# Patient Record
Sex: Male | Born: 1950 | Race: Black or African American | Hispanic: No | Marital: Married | Smoking: Former smoker
Health system: Southern US, Community
[De-identification: ages and names within clinical notes are randomized; demographics above are authoritative.]

## PROBLEM LIST (undated history)

## (undated) DIAGNOSIS — F4310 Post-traumatic stress disorder, unspecified: Secondary | ICD-10-CM

## (undated) DIAGNOSIS — M48061 Spinal stenosis, lumbar region without neurogenic claudication: Secondary | ICD-10-CM

## (undated) DIAGNOSIS — N400 Enlarged prostate without lower urinary tract symptoms: Secondary | ICD-10-CM

## (undated) DIAGNOSIS — I10 Essential (primary) hypertension: Secondary | ICD-10-CM

## (undated) DIAGNOSIS — F319 Bipolar disorder, unspecified: Secondary | ICD-10-CM

## (undated) DIAGNOSIS — C3490 Malignant neoplasm of unspecified part of unspecified bronchus or lung: Secondary | ICD-10-CM

## (undated) DIAGNOSIS — Z8719 Personal history of other diseases of the digestive system: Secondary | ICD-10-CM

## (undated) DIAGNOSIS — F528 Other sexual dysfunction not due to a substance or known physiological condition: Secondary | ICD-10-CM

## (undated) DIAGNOSIS — J309 Allergic rhinitis, unspecified: Secondary | ICD-10-CM

## (undated) DIAGNOSIS — E785 Hyperlipidemia, unspecified: Secondary | ICD-10-CM

## (undated) DIAGNOSIS — T8859XA Other complications of anesthesia, initial encounter: Secondary | ICD-10-CM

## (undated) DIAGNOSIS — E119 Type 2 diabetes mellitus without complications: Secondary | ICD-10-CM

## (undated) DIAGNOSIS — Z86010 Personal history of colonic polyps: Secondary | ICD-10-CM

## (undated) HISTORY — DX: Spinal stenosis, lumbar region without neurogenic claudication: M48.061

## (undated) HISTORY — DX: Type 2 diabetes mellitus without complications: E11.9

## (undated) HISTORY — DX: Personal history of colonic polyps: Z86.010

## (undated) HISTORY — DX: Bipolar disorder, unspecified: F31.9

## (undated) HISTORY — DX: Post-traumatic stress disorder, unspecified: F43.10

## (undated) HISTORY — PX: PREPATELLAR BURSA EXCISION: SUR547

## (undated) HISTORY — DX: Benign prostatic hyperplasia without lower urinary tract symptoms: N40.0

## (undated) HISTORY — DX: Essential (primary) hypertension: I10

## (undated) HISTORY — DX: Personal history of other diseases of the digestive system: Z87.19

## (undated) HISTORY — PX: OTHER SURGICAL HISTORY: SHX169

## (undated) HISTORY — DX: Allergic rhinitis, unspecified: J30.9

## (undated) HISTORY — DX: Hyperlipidemia, unspecified: E78.5

## (undated) HISTORY — DX: Other sexual dysfunction not due to a substance or known physiological condition: F52.8

## (undated) HISTORY — PX: TONSILLECTOMY: SUR1361

---

## 2001-07-27 ENCOUNTER — Emergency Department (HOSPITAL_COMMUNITY): Admission: EM | Admit: 2001-07-27 | Discharge: 2001-07-27 | Payer: Self-pay | Admitting: Emergency Medicine

## 2001-07-27 ENCOUNTER — Encounter: Payer: Self-pay | Admitting: Emergency Medicine

## 2005-03-25 ENCOUNTER — Ambulatory Visit: Payer: Self-pay | Admitting: Internal Medicine

## 2005-10-01 ENCOUNTER — Ambulatory Visit: Payer: Self-pay | Admitting: Internal Medicine

## 2005-10-07 ENCOUNTER — Ambulatory Visit (HOSPITAL_COMMUNITY): Admission: RE | Admit: 2005-10-07 | Discharge: 2005-10-07 | Payer: Self-pay | Admitting: Internal Medicine

## 2005-11-29 ENCOUNTER — Ambulatory Visit: Payer: Self-pay | Admitting: Internal Medicine

## 2005-12-17 ENCOUNTER — Ambulatory Visit: Payer: Self-pay | Admitting: Endocrinology

## 2006-03-05 ENCOUNTER — Ambulatory Visit: Payer: Self-pay | Admitting: Internal Medicine

## 2006-05-19 ENCOUNTER — Ambulatory Visit: Payer: Self-pay | Admitting: Internal Medicine

## 2006-11-18 HISTORY — PX: OTHER SURGICAL HISTORY: SHX169

## 2007-08-10 ENCOUNTER — Encounter: Payer: Self-pay | Admitting: Internal Medicine

## 2007-08-10 DIAGNOSIS — Z8719 Personal history of other diseases of the digestive system: Secondary | ICD-10-CM | POA: Insufficient documentation

## 2007-08-10 DIAGNOSIS — F411 Generalized anxiety disorder: Secondary | ICD-10-CM | POA: Insufficient documentation

## 2007-08-10 DIAGNOSIS — F329 Major depressive disorder, single episode, unspecified: Secondary | ICD-10-CM | POA: Insufficient documentation

## 2007-08-10 HISTORY — DX: Personal history of other diseases of the digestive system: Z87.19

## 2007-10-07 ENCOUNTER — Emergency Department (HOSPITAL_COMMUNITY): Admission: EM | Admit: 2007-10-07 | Discharge: 2007-10-07 | Payer: Self-pay | Admitting: Emergency Medicine

## 2007-10-08 ENCOUNTER — Ambulatory Visit: Payer: Self-pay | Admitting: Internal Medicine

## 2007-10-08 DIAGNOSIS — M79609 Pain in unspecified limb: Secondary | ICD-10-CM | POA: Insufficient documentation

## 2007-10-08 DIAGNOSIS — E785 Hyperlipidemia, unspecified: Secondary | ICD-10-CM

## 2007-10-08 DIAGNOSIS — Z860100 Personal history of colon polyps, unspecified: Secondary | ICD-10-CM | POA: Insufficient documentation

## 2007-10-08 DIAGNOSIS — F528 Other sexual dysfunction not due to a substance or known physiological condition: Secondary | ICD-10-CM

## 2007-10-08 DIAGNOSIS — M48061 Spinal stenosis, lumbar region without neurogenic claudication: Secondary | ICD-10-CM

## 2007-10-08 DIAGNOSIS — J309 Allergic rhinitis, unspecified: Secondary | ICD-10-CM

## 2007-10-08 DIAGNOSIS — E119 Type 2 diabetes mellitus without complications: Secondary | ICD-10-CM

## 2007-10-08 HISTORY — DX: Type 2 diabetes mellitus without complications: E11.9

## 2007-10-08 HISTORY — DX: Other sexual dysfunction not due to a substance or known physiological condition: F52.8

## 2007-10-08 HISTORY — DX: Spinal stenosis, lumbar region without neurogenic claudication: M48.061

## 2007-10-08 HISTORY — DX: Allergic rhinitis, unspecified: J30.9

## 2007-10-08 HISTORY — DX: Hyperlipidemia, unspecified: E78.5

## 2007-10-08 HISTORY — DX: Personal history of colon polyps, unspecified: Z86.0100

## 2007-10-08 HISTORY — DX: Personal history of colonic polyps: Z86.010

## 2007-10-09 ENCOUNTER — Encounter (INDEPENDENT_AMBULATORY_CARE_PROVIDER_SITE_OTHER): Payer: Self-pay | Admitting: *Deleted

## 2007-10-12 ENCOUNTER — Ambulatory Visit: Payer: Self-pay | Admitting: Internal Medicine

## 2007-10-13 ENCOUNTER — Encounter: Payer: Self-pay | Admitting: Internal Medicine

## 2007-10-30 ENCOUNTER — Ambulatory Visit: Payer: Self-pay | Admitting: Internal Medicine

## 2007-10-30 DIAGNOSIS — N529 Male erectile dysfunction, unspecified: Secondary | ICD-10-CM | POA: Insufficient documentation

## 2007-10-30 LAB — CONVERTED CEMR LAB
ALT: 23 units/L (ref 0–53)
Albumin: 3.5 g/dL (ref 3.5–5.2)
Alkaline Phosphatase: 74 units/L (ref 39–117)
BUN: 15 mg/dL (ref 6–23)
Basophils Relative: 0.6 % (ref 0.0–1.0)
CO2: 30 meq/L (ref 19–32)
Calcium: 10 mg/dL (ref 8.4–10.5)
Eosinophils Absolute: 0.1 10*3/uL (ref 0.0–0.6)
Eosinophils Relative: 1 % (ref 0.0–5.0)
GFR calc Af Amer: 99 mL/min
GFR calc non Af Amer: 82 mL/min
Ketones, ur: NEGATIVE mg/dL
Leukocytes, UA: NEGATIVE
MCV: 83.7 fL (ref 78.0–100.0)
Platelets: 261 10*3/uL (ref 150–400)
RBC: 5.6 M/uL (ref 4.22–5.81)
Specific Gravity, Urine: >=1.03 (ref 1.000–1.03)
TSH: 0.56 microintl units/mL (ref 0.35–5.50)
Testosterone: 321.88 ng/dL — ABNORMAL LOW (ref 350.00–890)
Total CHOL/HDL Ratio: 4
Triglycerides: 65 mg/dL (ref 0–149)
VLDL: 13 mg/dL (ref 0–40)
WBC: 11.4 10*3/uL — ABNORMAL HIGH (ref 4.5–10.5)
pH: 5.5 (ref 5.0–8.0)

## 2007-11-01 LAB — CONVERTED CEMR LAB: Hepatitis B Surface Ag: NEGATIVE

## 2007-11-04 ENCOUNTER — Ambulatory Visit: Payer: Self-pay | Admitting: Internal Medicine

## 2007-11-04 DIAGNOSIS — M549 Dorsalgia, unspecified: Secondary | ICD-10-CM | POA: Insufficient documentation

## 2007-11-05 ENCOUNTER — Encounter: Payer: Self-pay | Admitting: Internal Medicine

## 2007-11-20 ENCOUNTER — Encounter: Payer: Self-pay | Admitting: Internal Medicine

## 2007-12-17 ENCOUNTER — Ambulatory Visit (HOSPITAL_BASED_OUTPATIENT_CLINIC_OR_DEPARTMENT_OTHER): Admission: RE | Admit: 2007-12-17 | Discharge: 2007-12-17 | Payer: Self-pay | Admitting: Urology

## 2008-03-17 ENCOUNTER — Encounter: Payer: Self-pay | Admitting: Urology

## 2008-03-17 ENCOUNTER — Ambulatory Visit (HOSPITAL_BASED_OUTPATIENT_CLINIC_OR_DEPARTMENT_OTHER): Admission: RE | Admit: 2008-03-17 | Discharge: 2008-03-17 | Payer: Self-pay | Admitting: Urology

## 2008-03-28 ENCOUNTER — Ambulatory Visit: Payer: Self-pay | Admitting: Internal Medicine

## 2008-03-28 DIAGNOSIS — R42 Dizziness and giddiness: Secondary | ICD-10-CM | POA: Insufficient documentation

## 2008-03-28 DIAGNOSIS — H6690 Otitis media, unspecified, unspecified ear: Secondary | ICD-10-CM | POA: Insufficient documentation

## 2008-03-28 DIAGNOSIS — R110 Nausea: Secondary | ICD-10-CM | POA: Insufficient documentation

## 2008-04-08 ENCOUNTER — Encounter: Payer: Self-pay | Admitting: Internal Medicine

## 2008-05-11 ENCOUNTER — Encounter: Admission: RE | Admit: 2008-05-11 | Discharge: 2008-05-11 | Payer: Self-pay | Admitting: Sports Medicine

## 2008-06-02 ENCOUNTER — Encounter: Admission: RE | Admit: 2008-06-02 | Discharge: 2008-06-02 | Payer: Self-pay | Admitting: Sports Medicine

## 2008-06-14 ENCOUNTER — Ambulatory Visit: Payer: Self-pay | Admitting: Internal Medicine

## 2008-06-14 DIAGNOSIS — J019 Acute sinusitis, unspecified: Secondary | ICD-10-CM | POA: Insufficient documentation

## 2008-06-21 ENCOUNTER — Encounter: Admission: RE | Admit: 2008-06-21 | Discharge: 2008-06-21 | Payer: Self-pay | Admitting: Sports Medicine

## 2008-10-25 ENCOUNTER — Encounter: Payer: Self-pay | Admitting: Internal Medicine

## 2008-11-22 ENCOUNTER — Ambulatory Visit (HOSPITAL_COMMUNITY): Admission: RE | Admit: 2008-11-22 | Discharge: 2008-11-22 | Payer: Self-pay | Admitting: Neurosurgery

## 2008-11-25 ENCOUNTER — Encounter: Payer: Self-pay | Admitting: Internal Medicine

## 2008-12-01 ENCOUNTER — Ambulatory Visit: Payer: Self-pay | Admitting: Internal Medicine

## 2008-12-01 LAB — CONVERTED CEMR LAB
ALT: 21 units/L (ref 0–53)
Albumin: 3.9 g/dL (ref 3.5–5.2)
BUN: 16 mg/dL (ref 6–23)
Basophils Relative: 3.4 % — ABNORMAL HIGH (ref 0.0–3.0)
Bilirubin Urine: NEGATIVE
CO2: 30 meq/L (ref 19–32)
Calcium: 10.1 mg/dL (ref 8.4–10.5)
Creatinine, Ser: 1 mg/dL (ref 0.4–1.5)
Creatinine,U: 175.8 mg/dL
Crystals: NEGATIVE
GFR calc Af Amer: 99 mL/min
Glucose, Bld: 160 mg/dL — ABNORMAL HIGH (ref 70–99)
HCT: 50.3 % (ref 39.0–52.0)
HDL: 32.6 mg/dL — ABNORMAL LOW (ref >39.0)
Hemoglobin: 16.9 g/dL (ref 13.0–17.0)
Ketones, ur: NEGATIVE mg/dL
Lymphocytes Relative: 33.4 % (ref 12.0–46.0)
MCHC: 33.5 g/dL (ref 30.0–36.0)
Microalb Creat Ratio: 267.9 mg/g — ABNORMAL HIGH (ref 0.0–30.0)
Microalb, Ur: 47.1 mg/dL — ABNORMAL HIGH (ref 0.0–1.9)
Monocytes Absolute: 0.5 10*3/uL (ref 0.1–1.0)
Monocytes Relative: 4.8 % (ref 3.0–12.0)
Neutro Abs: 5.5 10*3/uL (ref 1.4–7.7)
Nitrite: NEGATIVE
RBC: 5.96 M/uL — ABNORMAL HIGH (ref 4.22–5.81)
RDW: 13.1 % (ref 11.5–14.6)
Sodium: 141 meq/L (ref 135–145)
Specific Gravity, Urine: >=1.03 (ref 1.000–1.03)
TSH: 0.97 microintl units/mL (ref 0.35–5.50)
Total Protein, Urine: 100 mg/dL — AB
Total Protein: 7.2 g/dL (ref 6.0–8.3)
Triglycerides: 85 mg/dL (ref 0–149)
pH: 5 (ref 5.0–8.0)

## 2008-12-06 ENCOUNTER — Ambulatory Visit: Payer: Self-pay | Admitting: Internal Medicine

## 2008-12-06 DIAGNOSIS — R030 Elevated blood-pressure reading, without diagnosis of hypertension: Secondary | ICD-10-CM | POA: Insufficient documentation

## 2008-12-06 DIAGNOSIS — H1045 Other chronic allergic conjunctivitis: Secondary | ICD-10-CM | POA: Insufficient documentation

## 2008-12-26 ENCOUNTER — Inpatient Hospital Stay (HOSPITAL_COMMUNITY): Admission: RE | Admit: 2008-12-26 | Discharge: 2008-12-29 | Payer: Self-pay | Admitting: Neurosurgery

## 2009-01-24 ENCOUNTER — Encounter: Payer: Self-pay | Admitting: Internal Medicine

## 2009-02-02 ENCOUNTER — Encounter: Payer: Self-pay | Admitting: Internal Medicine

## 2009-02-19 ENCOUNTER — Encounter: Payer: Self-pay | Admitting: Internal Medicine

## 2009-03-01 ENCOUNTER — Ambulatory Visit (HOSPITAL_COMMUNITY): Admission: RE | Admit: 2009-03-01 | Discharge: 2009-03-03 | Payer: Self-pay | Admitting: Neurosurgery

## 2009-03-20 ENCOUNTER — Ambulatory Visit: Payer: Self-pay | Admitting: Internal Medicine

## 2009-03-20 DIAGNOSIS — L989 Disorder of the skin and subcutaneous tissue, unspecified: Secondary | ICD-10-CM | POA: Insufficient documentation

## 2009-03-20 DIAGNOSIS — I10 Essential (primary) hypertension: Secondary | ICD-10-CM

## 2009-03-20 HISTORY — DX: Essential (primary) hypertension: I10

## 2009-03-21 ENCOUNTER — Telehealth: Payer: Self-pay | Admitting: Internal Medicine

## 2009-03-21 LAB — CONVERTED CEMR LAB
BUN: 12 mg/dL (ref 6–23)
CO2: 31 meq/L (ref 19–32)
Chloride: 98 meq/L (ref 96–112)
Cholesterol: 219 mg/dL — ABNORMAL HIGH (ref 0–200)
Creatinine, Ser: 0.9 mg/dL (ref 0.4–1.5)
Direct LDL: 147.8 mg/dL
Potassium: 4.2 meq/L (ref 3.5–5.1)
Total CHOL/HDL Ratio: 5
VLDL: 20.2 mg/dL (ref 0.0–40.0)

## 2009-03-28 ENCOUNTER — Encounter: Payer: Self-pay | Admitting: Internal Medicine

## 2009-05-18 ENCOUNTER — Ambulatory Visit: Payer: Self-pay | Admitting: Internal Medicine

## 2009-05-18 DIAGNOSIS — R159 Full incontinence of feces: Secondary | ICD-10-CM | POA: Insufficient documentation

## 2009-06-21 ENCOUNTER — Telehealth (INDEPENDENT_AMBULATORY_CARE_PROVIDER_SITE_OTHER): Payer: Self-pay | Admitting: *Deleted

## 2009-06-26 ENCOUNTER — Encounter: Payer: Self-pay | Admitting: Internal Medicine

## 2009-07-01 ENCOUNTER — Encounter: Payer: Self-pay | Admitting: Internal Medicine

## 2009-07-03 ENCOUNTER — Ambulatory Visit: Payer: Self-pay | Admitting: Internal Medicine

## 2009-07-03 LAB — CONVERTED CEMR LAB
BUN: 15 mg/dL (ref 6–23)
Cholesterol: 121 mg/dL (ref 0–200)
Creatinine, Ser: 0.9 mg/dL (ref 0.4–1.5)
GFR calc non Af Amer: 111.51 mL/min (ref >60)
Potassium: 4.8 meq/L (ref 3.5–5.1)
Triglycerides: 63 mg/dL (ref 0.0–149.0)
VLDL: 12.6 mg/dL (ref 0.0–40.0)

## 2009-07-05 ENCOUNTER — Ambulatory Visit: Payer: Self-pay | Admitting: Internal Medicine

## 2009-09-26 ENCOUNTER — Ambulatory Visit: Payer: Self-pay | Admitting: Internal Medicine

## 2009-09-26 DIAGNOSIS — R079 Chest pain, unspecified: Secondary | ICD-10-CM | POA: Insufficient documentation

## 2009-09-26 DIAGNOSIS — F329 Major depressive disorder, single episode, unspecified: Secondary | ICD-10-CM | POA: Insufficient documentation

## 2009-09-27 ENCOUNTER — Telehealth (INDEPENDENT_AMBULATORY_CARE_PROVIDER_SITE_OTHER): Payer: Self-pay | Admitting: *Deleted

## 2009-10-02 ENCOUNTER — Ambulatory Visit: Payer: Self-pay | Admitting: Licensed Clinical Social Worker

## 2009-10-02 ENCOUNTER — Telehealth (INDEPENDENT_AMBULATORY_CARE_PROVIDER_SITE_OTHER): Payer: Self-pay

## 2009-10-03 ENCOUNTER — Ambulatory Visit: Payer: Self-pay

## 2009-10-03 ENCOUNTER — Encounter (HOSPITAL_COMMUNITY): Admission: RE | Admit: 2009-10-03 | Discharge: 2009-11-16 | Payer: Self-pay | Admitting: Internal Medicine

## 2009-10-03 ENCOUNTER — Ambulatory Visit: Payer: Self-pay | Admitting: Cardiology

## 2009-10-06 ENCOUNTER — Encounter: Payer: Self-pay | Admitting: Internal Medicine

## 2009-10-10 ENCOUNTER — Encounter: Payer: Self-pay | Admitting: Internal Medicine

## 2009-10-16 ENCOUNTER — Ambulatory Visit: Payer: Self-pay | Admitting: Licensed Clinical Social Worker

## 2009-10-23 ENCOUNTER — Ambulatory Visit: Payer: Self-pay | Admitting: Licensed Clinical Social Worker

## 2009-10-26 ENCOUNTER — Telehealth (INDEPENDENT_AMBULATORY_CARE_PROVIDER_SITE_OTHER): Payer: Self-pay | Admitting: *Deleted

## 2009-11-01 ENCOUNTER — Ambulatory Visit: Payer: Self-pay | Admitting: Licensed Clinical Social Worker

## 2009-11-09 ENCOUNTER — Encounter: Payer: Self-pay | Admitting: Internal Medicine

## 2009-11-15 ENCOUNTER — Ambulatory Visit: Payer: Self-pay | Admitting: Licensed Clinical Social Worker

## 2009-11-22 ENCOUNTER — Ambulatory Visit: Payer: Self-pay | Admitting: Licensed Clinical Social Worker

## 2009-12-07 ENCOUNTER — Ambulatory Visit: Payer: Self-pay | Admitting: Licensed Clinical Social Worker

## 2009-12-28 ENCOUNTER — Encounter: Payer: Self-pay | Admitting: Internal Medicine

## 2010-02-02 ENCOUNTER — Encounter: Payer: Self-pay | Admitting: Internal Medicine

## 2010-02-05 ENCOUNTER — Telehealth: Payer: Self-pay | Admitting: Internal Medicine

## 2010-02-19 ENCOUNTER — Telehealth: Payer: Self-pay | Admitting: Internal Medicine

## 2010-03-16 ENCOUNTER — Ambulatory Visit (HOSPITAL_COMMUNITY): Admission: RE | Admit: 2010-03-16 | Discharge: 2010-03-16 | Payer: Self-pay | Admitting: Neurosurgery

## 2010-04-11 ENCOUNTER — Encounter: Payer: Self-pay | Admitting: Internal Medicine

## 2010-05-10 ENCOUNTER — Ambulatory Visit: Payer: Self-pay | Admitting: Internal Medicine

## 2010-05-10 DIAGNOSIS — R059 Cough, unspecified: Secondary | ICD-10-CM | POA: Insufficient documentation

## 2010-05-10 DIAGNOSIS — R05 Cough: Secondary | ICD-10-CM

## 2010-05-11 LAB — CONVERTED CEMR LAB
Basophils Absolute: 0 10*3/uL (ref 0.0–0.1)
CO2: 30 meq/L (ref 19–32)
Chloride: 102 meq/L (ref 96–112)
Glucose, Bld: 114 mg/dL — ABNORMAL HIGH (ref 70–99)
HDL: 49.1 mg/dL (ref >39.00)
Hgb A1c MFr Bld: 7.9 % — ABNORMAL HIGH (ref 4.6–6.5)
LDL Cholesterol: 108 mg/dL — ABNORMAL HIGH (ref 0–99)
Lymphocytes Relative: 34.6 % (ref 12.0–46.0)
Monocytes Relative: 6.4 % (ref 3.0–12.0)
Neutrophils Relative %: 57.2 % (ref 43.0–77.0)
Platelets: 210 10*3/uL (ref 150.0–400.0)
RDW: 15.1 % — ABNORMAL HIGH (ref 11.5–14.6)
Sodium: 139 meq/L (ref 135–145)
Total CHOL/HDL Ratio: 4
Triglycerides: 85 mg/dL (ref 0.0–149.0)
WBC: 12.7 10*3/uL — ABNORMAL HIGH (ref 4.5–10.5)

## 2010-05-22 ENCOUNTER — Telehealth: Payer: Self-pay | Admitting: Internal Medicine

## 2010-06-14 ENCOUNTER — Encounter (INDEPENDENT_AMBULATORY_CARE_PROVIDER_SITE_OTHER): Payer: Self-pay | Admitting: *Deleted

## 2010-06-14 ENCOUNTER — Telehealth: Payer: Self-pay | Admitting: Internal Medicine

## 2010-07-13 ENCOUNTER — Ambulatory Visit: Payer: Self-pay | Admitting: Licensed Clinical Social Worker

## 2010-09-18 ENCOUNTER — Telehealth (INDEPENDENT_AMBULATORY_CARE_PROVIDER_SITE_OTHER): Payer: Self-pay | Admitting: *Deleted

## 2010-09-21 ENCOUNTER — Ambulatory Visit: Payer: Self-pay | Admitting: Internal Medicine

## 2010-09-21 LAB — CONVERTED CEMR LAB
AST: 15 units/L (ref 0–37)
BUN: 17 mg/dL (ref 6–23)
Basophils Absolute: 0 10*3/uL (ref 0.0–0.1)
Calcium: 10.1 mg/dL (ref 8.4–10.5)
Cholesterol: 198 mg/dL (ref 0–200)
Creatinine, Ser: 0.9 mg/dL (ref 0.4–1.5)
GFR calc non Af Amer: 106.92 mL/min (ref >60)
Glucose, Bld: 93 mg/dL (ref 70–99)
HCT: 51.8 % (ref 39.0–52.0)
HDL: 42.2 mg/dL (ref >39.00)
Leukocytes, UA: NEGATIVE
Lymphs Abs: 3.1 10*3/uL (ref 0.7–4.0)
Monocytes Absolute: 0.7 10*3/uL (ref 0.1–1.0)
Monocytes Relative: 5.7 % (ref 3.0–12.0)
Nitrite: NEGATIVE
PSA: 0.84 ng/mL (ref 0.10–4.00)
Platelets: 244 10*3/uL (ref 150.0–400.0)
Potassium: 5.2 meq/L — ABNORMAL HIGH (ref 3.5–5.1)
RDW: 14.9 % — ABNORMAL HIGH (ref 11.5–14.6)
Specific Gravity, Urine: >=1.03 (ref 1.000–1.030)
TSH: 0.57 microintl units/mL (ref 0.35–5.50)
Total Bilirubin: 0.4 mg/dL (ref 0.3–1.2)
Triglycerides: 90 mg/dL (ref 0.0–149.0)
Urobilinogen, UA: 0.2 (ref 0.0–1.0)
VLDL: 18 mg/dL (ref 0.0–40.0)

## 2010-09-25 ENCOUNTER — Encounter: Payer: Self-pay | Admitting: Internal Medicine

## 2010-09-28 ENCOUNTER — Ambulatory Visit: Payer: Self-pay | Admitting: Internal Medicine

## 2010-09-28 ENCOUNTER — Encounter: Payer: Self-pay | Admitting: Internal Medicine

## 2010-09-28 DIAGNOSIS — N400 Enlarged prostate without lower urinary tract symptoms: Secondary | ICD-10-CM

## 2010-09-28 HISTORY — DX: Benign prostatic hyperplasia without lower urinary tract symptoms: N40.0

## 2010-10-09 ENCOUNTER — Ambulatory Visit: Payer: Self-pay | Admitting: Licensed Clinical Social Worker

## 2010-11-19 IMAGING — CR IR MYELOGRAM [PERSON_NAME]
2 series · 2 of 2 positions shown · non-contrast
Comparison: None.

Addendum Begins

Fluoroscopic time:  1.37 minutes
Addendum Ends
CLINICAL DATA: Left arm pain and weakness.  Low back pain and
bilateral leg pain.
MYELOGRAM CERVICAL
TECHNIQUE: Contrast was injected by Dr.Toral at the L5-S1  level.
Contrast was negotiated into the cervical spine without difficulty.
TECHNIQUE: Multidetector CT imaging of the cervical spine was
performed following myelography.  Multiplanar CT image
reconstructions were also generated.
TECHNIQUE: CT imaging of the lumbar spine was performed after
intrathecal contrast administration.  Multiplanar CT image

[view not recorded (1 of 2)]
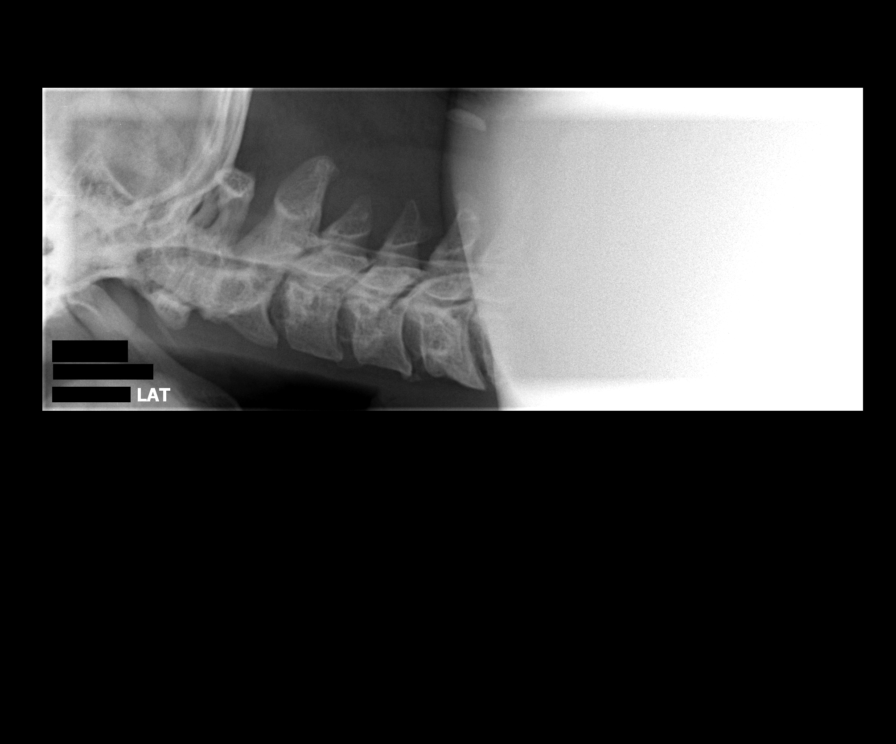

[view not recorded (2 of 2)]
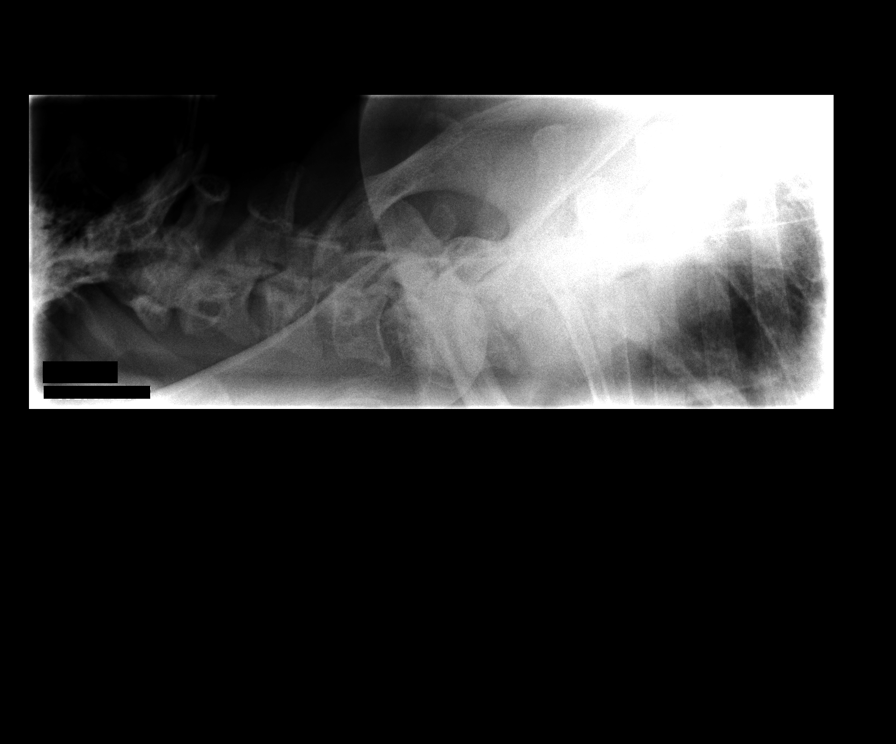

[2 of 2 positions shown; findings below may reference images not displayed]

Ventral impression upon the thecal sac C2-3 through C7-T1. Mild
thickening nerve roots bilaterally C6-7 and C7-T1 level.  Please
see below.
IMPRESSION: Ventral impression upon the thecal sac C2-3 through C7-
T1.

Mild thickening nerve roots bilaterally C6-7 and C7-T1.  Please see
below.
FINDINGS: CT MYELOGRAPHY CERVICAL SPINE
FINDINGS: Right apical bullae/blebs with adjacent pulmonary
parenchymal changes, calcifications and pleural thickening without
bony destruction.  Less notable changes on the left.  Stability can
be confirmed on follow-up.

Present examination incorporates from the occiput to the lower T3
level.  Cervical medullary junction unremarkable.

C2-3:  Minimal bulge.

C3-4:  Baseline small broad-based disc protrusion slightly greater
towards right.  Superimposed upon such is a small to slightly
moderate sized focal central disc protrusion causing slight cord
flattening.

C4-5:  Small to slightly moderate sized broad-based disc osteophyte
complex with moderate spinal stenosis and mild cord flattening.
Minimal uncinate hypertrophy.  Minimal foraminal narrowing
bilaterally.

C5-6:  Disc degeneration and with broad-based osteophyte causing
mild to moderate spinal stenosis.  Uncinate hypertrophy with mild
bilateral foraminal narrowing.

C6-7:  Evaluation is slightly limited by patient's shoulders.
Findings suggestive of moderate sized central disc protrusion with
bony cover causing moderate spinal stenosis and mild cord
flattening.  Minimal uncinate hypertrophy and facet joint
degenerative changes.  Mild bilateral foraminal narrowing.

C7-T1:  Evaluation limited by patient's shoulders.  Mild bulge.
Mild spinal stenosis.  Minimal uncinate hypertrophy and facet joint
degenerative changes.  Minimal bilateral foraminal narrowing.

T1-2 and T2-3.  without significant spinal stenosis or foraminal
narrowing.
IMPRESSION: Cervical spondylotic changes and spinal stenosis most notable C3-4
through C6-7 as described above.

MYELOGRAM LUMBAR
FINDINGS: Anterior slip of L4 upon L5 by 7 mm.  No significant
abnormal motion between flexion and extension occurs at this level.
Disc space narrowing most notable anteriorly.  Circumferential
moderate L4-5 spinal stenosis.

The left S1 nerve root appears slightly thickened.  This may be
related to disc disease.  Please see below.

Bilateral facet joint degenerative changes L3-4, L4-5 and L5 S for
the most prominent at the L4-5 level.
IMPRESSION: Anterior slip of L4 upon L5.  Circumferential moderate L4-5 spinal
stenosis.

Left S1 nerve root appears slightly thickened.  Please see below.

CT MYELOGRAPHY LUMBAR SPINE
FINDINGS: 1.5 cm right renal lesion may be a cyst although
incompletely evaluated on the present exam.  Calcification of the
portions of the aorta and iliac arteries.  This is incompletely
evaluated present exam.

The present examination incorporates from the lower T12 through the
upper sacrum.  The last fully open disc space is labeled L5-S1.
The L5 vertebral body partially take place with the sacrum
laterally.  Conus upper L2 level without surrounding abnormal
vasculature.

T12-L1:  Right-sided facet joint bony overgrowth with slight
impression upon the right lateral posterior aspect of thecal sac.

L1-2:  No significant spinal stenosis or foraminal narrowing.

L2-3:  Mild bulge.  Minimal facet joint degenerative changes.  Very
mild spinal stenosis.

L3-4:  Mild bilateral facet joint degenerative changes.  Mild bulge
minimally more notable towards the right.  Mild spinal stenosis
right greater left.

L4-5:  Marked bilateral facet joint degenerative changes.  7 mm
anterior slip of L4.  Moderate sized broad-based disc protrusion
with extension into the foramen right greater left.  Moderate
compression of the right L4 exiting nerve root.  Mild compression
of the left L4 exiting nerve root. Ligamentum flavum hypertrophy.
Multifactorial moderate to marked spinal stenosis and bilateral
lateral recess stenosis.

L5-S1:  Partial articulation lateral aspect L5 vertebra with the
upper sacrum.  Moderate bilateral facet joint degenerative changes.
Moderate sized broad-based disc protrusion with greatest extension
right posterior lateral position where there is associated bony
cover along the periphery.  This causes mass effect upon the right
lateral aspect of thecal sac just above the takeoff of the right S1
nerve root.  Mild to moderate right-sided and mild left-sided
foraminal narrowing.  Encroachment upon the course of the exiting
L5 nerve root right greater left.
IMPRESSION: Most significant findings are at the L4-5 level where there is
multifactorial moderate to marked spinal stenosis and right greater
than left foraminal narrowing.

L5-S1 right-sided disc protrusion and associated mass effect as
discussed above.

Right renal 1.5 cm low density structure may be a cyst although
incompletely evaluated present exam.

Calcifications consistent with atherosclerotic type changes
involving portions of the aorta and iliac arteries

## 2010-11-19 IMAGING — CT CT CERVICAL SPINE W/ CM
2 of 20 series · 2 of 22 positions shown, 3 images · non-contrast
Comparison: None.

Addendum Begins

Fluoroscopic time:  1.37 minutes
Addendum Ends
CLINICAL DATA: Left arm pain and weakness.  Low back pain and
bilateral leg pain.
MYELOGRAM CERVICAL
TECHNIQUE: Contrast was injected by Dr.Toral at the L5-S1  level.
Contrast was negotiated into the cervical spine without difficulty.
TECHNIQUE: Multidetector CT imaging of the cervical spine was
performed following myelography.  Multiplanar CT image
reconstructions were also generated.
TECHNIQUE: CT imaging of the lumbar spine was performed after
intrathecal contrast administration.  Multiplanar CT image

[Series 2: l-spine · axial · 0.27mm/px · z∈[-330,-330]mm · 1 of 80 slices shown, 2 images]
[im 1/80  soft-tissue]
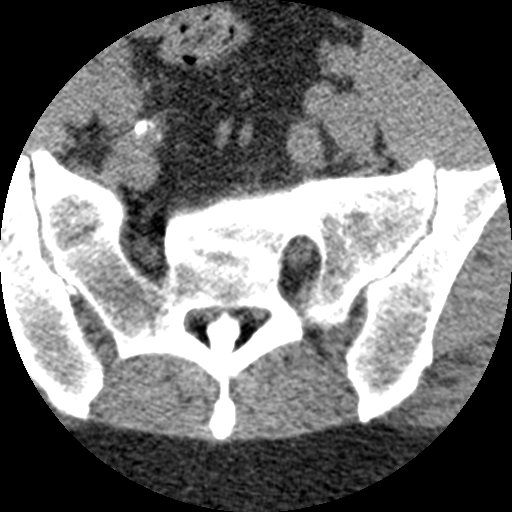
[im 1/80  bone]
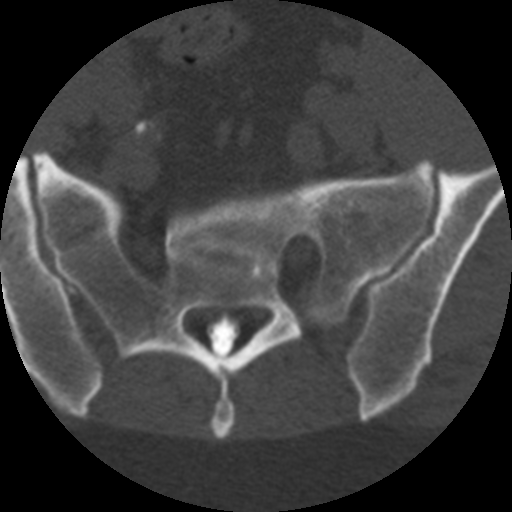

[Series 801: coronal cspine- bone · coronal · 0.39mm/px · 1 of 44 slices shown]
[im 22/44  bone]
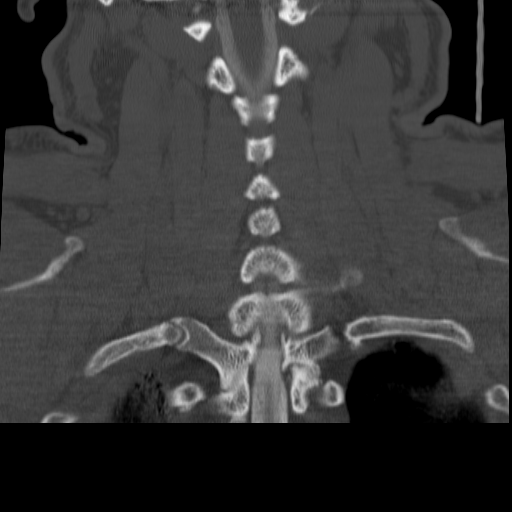

[2 of 22 positions shown; findings below may reference images not displayed]

Ventral impression upon the thecal sac C2-3 through C7-T1. Mild
thickening nerve roots bilaterally C6-7 and C7-T1 level.  Please
see below.
IMPRESSION: Ventral impression upon the thecal sac C2-3 through C7-
T1.

Mild thickening nerve roots bilaterally C6-7 and C7-T1.  Please see
below.
FINDINGS: CT MYELOGRAPHY CERVICAL SPINE
FINDINGS: Right apical bullae/blebs with adjacent pulmonary
parenchymal changes, calcifications and pleural thickening without
bony destruction.  Less notable changes on the left.  Stability can
be confirmed on follow-up.

Present examination incorporates from the occiput to the lower T3
level.  Cervical medullary junction unremarkable.

C2-3:  Minimal bulge.

C3-4:  Baseline small broad-based disc protrusion slightly greater
towards right.  Superimposed upon such is a small to slightly
moderate sized focal central disc protrusion causing slight cord
flattening.

C4-5:  Small to slightly moderate sized broad-based disc osteophyte
complex with moderate spinal stenosis and mild cord flattening.
Minimal uncinate hypertrophy.  Minimal foraminal narrowing
bilaterally.

C5-6:  Disc degeneration and with broad-based osteophyte causing
mild to moderate spinal stenosis.  Uncinate hypertrophy with mild
bilateral foraminal narrowing.

C6-7:  Evaluation is slightly limited by patient's shoulders.
Findings suggestive of moderate sized central disc protrusion with
bony cover causing moderate spinal stenosis and mild cord
flattening.  Minimal uncinate hypertrophy and facet joint
degenerative changes.  Mild bilateral foraminal narrowing.

C7-T1:  Evaluation limited by patient's shoulders.  Mild bulge.
Mild spinal stenosis.  Minimal uncinate hypertrophy and facet joint
degenerative changes.  Minimal bilateral foraminal narrowing.

T1-2 and T2-3.  without significant spinal stenosis or foraminal
narrowing.
IMPRESSION: Cervical spondylotic changes and spinal stenosis most notable C3-4
through C6-7 as described above.

MYELOGRAM LUMBAR
FINDINGS: Anterior slip of L4 upon L5 by 7 mm.  No significant
abnormal motion between flexion and extension occurs at this level.
Disc space narrowing most notable anteriorly.  Circumferential
moderate L4-5 spinal stenosis.

The left S1 nerve root appears slightly thickened.  This may be
related to disc disease.  Please see below.

Bilateral facet joint degenerative changes L3-4, L4-5 and L5 S for
the most prominent at the L4-5 level.
IMPRESSION: Anterior slip of L4 upon L5.  Circumferential moderate L4-5 spinal
stenosis.

Left S1 nerve root appears slightly thickened.  Please see below.

CT MYELOGRAPHY LUMBAR SPINE
FINDINGS: 1.5 cm right renal lesion may be a cyst although
incompletely evaluated on the present exam.  Calcification of the
portions of the aorta and iliac arteries.  This is incompletely
evaluated present exam.

The present examination incorporates from the lower T12 through the
upper sacrum.  The last fully open disc space is labeled L5-S1.
The L5 vertebral body partially take place with the sacrum
laterally.  Conus upper L2 level without surrounding abnormal
vasculature.

T12-L1:  Right-sided facet joint bony overgrowth with slight
impression upon the right lateral posterior aspect of thecal sac.

L1-2:  No significant spinal stenosis or foraminal narrowing.

L2-3:  Mild bulge.  Minimal facet joint degenerative changes.  Very
mild spinal stenosis.

L3-4:  Mild bilateral facet joint degenerative changes.  Mild bulge
minimally more notable towards the right.  Mild spinal stenosis
right greater left.

L4-5:  Marked bilateral facet joint degenerative changes.  7 mm
anterior slip of L4.  Moderate sized broad-based disc protrusion
with extension into the foramen right greater left.  Moderate
compression of the right L4 exiting nerve root.  Mild compression
of the left L4 exiting nerve root. Ligamentum flavum hypertrophy.
Multifactorial moderate to marked spinal stenosis and bilateral
lateral recess stenosis.

L5-S1:  Partial articulation lateral aspect L5 vertebra with the
upper sacrum.  Moderate bilateral facet joint degenerative changes.
Moderate sized broad-based disc protrusion with greatest extension
right posterior lateral position where there is associated bony
cover along the periphery.  This causes mass effect upon the right
lateral aspect of thecal sac just above the takeoff of the right S1
nerve root.  Mild to moderate right-sided and mild left-sided
foraminal narrowing.  Encroachment upon the course of the exiting
L5 nerve root right greater left.
IMPRESSION: Most significant findings are at the L4-5 level where there is
multifactorial moderate to marked spinal stenosis and right greater
than left foraminal narrowing.

L5-S1 right-sided disc protrusion and associated mass effect as
discussed above.

Right renal 1.5 cm low density structure may be a cyst although
incompletely evaluated present exam.

Calcifications consistent with atherosclerotic type changes
involving portions of the aorta and iliac arteries

## 2010-12-20 NOTE — Progress Notes (Signed)
Summary: Rx refill req  Phone Note Refill Request   Refills Requested: Medication #1:  JANUVIA 100 MG TABS 1po once daily   Dosage confirmed as above?Dosage Confirmed   Supply Requested: 6 months  Method Requested: Electronic Initial call taken by: Margaret Pyle, CMA,  May 22, 2010 11:27 AM    Prescriptions: JANUVIA 100 MG TABS (SITAGLIPTIN PHOSPHATE) 1po once daily  #30 x 2   Entered by:   Margaret Pyle, CMA   Authorized by:   Corwin Levins MD   Signed by:   Margaret Pyle, CMA on 05/22/2010   Method used:   Electronically to        [facility] [phone] (retail)       [address]       [place], [place]  [address]       Ph: [phone]       Fax: [phone]   RxID:   [id number]

## 2010-12-20 NOTE — Progress Notes (Signed)
----   Converted from flag ---- ---- 09/18/2010 1:13 PM, Corwin Levins MD wrote: ok for earlier visit  ---- 09/18/2010 10:44 AM, Ivar Bury wrote: Dr Jonny Ruiz, Your pt is requesting a physical for this year.  Your nxt available is in Jan 2012.  Is it ok to work pt in earlier for this year.  Pt last physical was 12/06/08.  Thank you for your reply. ------------------------------  Gave pt appt phone:  09/28/10 @ 245p w/Dr Jonny Ruiz

## 2010-12-20 NOTE — Letter (Signed)
Summary: Generic Letter  [facility]  [address] [place], [place] [address]   Phone: [phone]  Fax: [phone]    06/14/2010  Yago Breit [address] [place], [place]  [address]  To Whom it may concern,  As [facility] Doctor, I feel it is ok for Mr. Janee Morn to use the exercise equipement and pool at the [employer] [facility]. Please feel free to contact our office at the above information if you have any more questions.           Sincerely,   Dr. Oliver Barre

## 2010-12-20 NOTE — Letter (Signed)
Summary: [facility]  [facility]   Imported By: Lennie Odor 10/17/2010 15:22:53  _____________________________________________________________________  External Attachment:    Type:   Image     Comment:   External Document

## 2010-12-20 NOTE — Assessment & Plan Note (Signed)
Summary: COUGH/ PNEMONIA? /NWS   Vital Signs:  Patient profile:   60 year old male Height:      68 inches Weight:      221.25 pounds BMI:     33.76 O2 Sat:      97 % on Room air Temp:     98.7 degrees F oral Pulse rate:   73 / minute BP sitting:   132 / 80  (left arm) Cuff size:   large  Vitals Entered By: Zella Ball Ewing CMA Duncan Dull) (May 10, 2010 4:16 PM)  O2 Flow:  Room air  CC: Cough, congestion, eye drainage/RE   CC:  Cough, congestion, and eye drainage/RE.  History of Present Illness: here feeling ill and somewhat agitated'  to start states he has had ongoing difficulty with the LUE primarily pain and numbness seen by surgury and most recently referred to Dr Lovell Sheehan for c-spine eval yet to occur;  has been quite frustrated it seems by the process and time to get done so he stopped taking all of his meds for approx 4 wks, and states he cannot take the actos anyway due to cost. Pt denies CP, wheezing, orthopnea, pnd, worsening LE edema, palps, or syncope  Pt denies other new neuro symptoms such as headache, facial or extremity weakness .  Pt denies polydipsia, polyuria, or low sugar symptoms such as shakiness improved with eating.  Recent BS 173 this am but not checking sugars as often as he used to.  Has mild increased depressive symptoms but no suicidal ideation or panic or hallucinations.  Today primary problem is c/o fever, weakness, dizzy/lightheaded, nausea and one mo increased prod cough greenish sputum, mild sob..  has had several sharp stabbing pains to the right scapular area, all similar to episode of prevoius pna.  Todya also with some drainage from both eye, midl bilat earache as well, no chills, rash, joint pains.    Problems Prior to Update: 1)  Cough  (ICD-786.2) 2)  Chest Pain  (ICD-786.50) 3)  Depression, Major  (ICD-296.20) 4)  Fecal Incontinence  (ICD-787.6) 5)  Skin Lesion  (ICD-709.9) 6)  Erectile Dysfunction  (ICD-302.72) 7)  Hypertension  (ICD-401.9) 8)   Allergic Conjunctivitis  (ICD-372.14) 9)  Elevated Blood Pressure Without Diagnosis of Hypertension  (ICD-796.2) 10)  Preventive Health Care  (ICD-V70.0) 11)  Sinusitis- Acute-nos  (ICD-461.9) 12)  Nausea  (ICD-787.02) 13)  Vertigo, Intermittent  (ICD-780.4) 14)  Otitis Media, Acute, Bilateral  (ICD-382.9) 15)  Back Pain  (ICD-724.5) 16)  Preventive Health Care  (ICD-V70.0) 17)  Impotence of Organic Origin  (ICD-607.84) 18)  Routine General Medical Exam@health  Care Fac[facility]  (ICD-V70.0) 19)  Depressive Disorder  (ICD-311) 20)  Arm Pain, Left  (ICD-729.5) 21)  Colonic Polyps, Hx of  (ICD-V12.72) 22)  Erectile Dysfunction  (ICD-302.72) 23)  Spinal Stenosis, Lumbar  (ICD-724.02) 24)  Allergic Rhinitis  (ICD-477.9) 25)  Hyperlipidemia  (ICD-272.4) 26)  Diabetes Mellitus, Type II  (ICD-250.00) 27)  Elective Surgery, Routine/ritual Circumcision  (ICD-V50.2) 28)  Constipation, Chronic, Hx of  (ICD-V12.79) 29)  Depression  (ICD-311) 30)  Anxiety  (ICD-300.00)  Medications Prior to Update: 1)  Adult Aspirin Ec Low Strength 81 Mg Tbec (Aspirin) .Marland Kitchen.. 1po Once Daily 2)  Actos 30 Mg Tabs (Pioglitazone Hcl) .Marland Kitchen.. 1 By Mouth Once Daily 3)  Optive 0.5-0.9 % Soln (Carboxymethylcellul-Glycerin) .... Use Asd Per Dr Emily Filbert 4)  Diazepam 5 Mg Tabs (Diazepam) .Marland Kitchen.. 1 By Mouth Once Daily As Needed 5)  Simvastatin 40  Mg Tabs (Simvastatin) .Marland Kitchen.. 1 By Mouth Once Daily 6)  Glimepiride 4 Mg Tabs (Glimepiride) .... 2 By Mouth Each Morning 7)  Januvia 100 Mg Tabs (Sitagliptin Phosphate) .Marland Kitchen.. 1po Once Daily 8)  Lortab 10 10-500 Mg Tabs (Hydrocodone-Acetaminophen) .Marland Kitchen.. 1 By Mouth Every 5 Hours As Needed 9)  Tylenol Extra Strength 500 Mg Tabs (Acetaminophen) .... Use Asd Per Otc Bottle 10)  Losartan Potassium 100 Mg Tabs (Losartan Potassium) .Marland Kitchen.. 1 By Mouth Once Daily 11)  Wellbutrin Xl 300 Mg Xr24h-Tab (Bupropion Hcl) .Marland Kitchen.. 1 By Mouth Once Daily (Generic - Starting The Second Month) 12)  Abilify 2 Mg Tabs (Aripiprazole)  .Marland Kitchen.. 1po At Bedtime 13)  Buspirone Hcl 15 Mg Tabs (Buspirone Hcl) .Marland Kitchen.. 1po Three Times A Day As Needed Anxiety  Current Medications (verified): 1)  Adult Aspirin Ec Low Strength 81 Mg Tbec (Aspirin) .Marland Kitchen.. 1po Once Daily 2)  Glucophage Xr 500 Mg Xr24h-Tab (Metformin Hcl) .... 2 By Mouth Qam 3)  Optive 0.5-0.9 % Soln (Carboxymethylcellul-Glycerin) .... Use Asd Per Dr Emily Filbert 4)  Diazepam 5 Mg Tabs (Diazepam) .Marland Kitchen.. 1 By Mouth Once Daily As Needed 5)  Simvastatin 40 Mg Tabs (Simvastatin) .Marland Kitchen.. 1 By Mouth Once Daily 6)  Januvia 100 Mg Tabs (Sitagliptin Phosphate) .Marland Kitchen.. 1po Once Daily 7)  Lortab 10 10-500 Mg Tabs (Hydrocodone-Acetaminophen) .Marland Kitchen.. 1 By Mouth Every 5 Hours As Needed 8)  Tylenol Extra Strength 500 Mg Tabs (Acetaminophen) .... Use Asd Per Otc Bottle 9)  Losartan Potassium 100 Mg Tabs (Losartan Potassium) .Marland Kitchen.. 1 By Mouth Once Daily 10)  Wellbutrin Xl 300 Mg Xr24h-Tab (Bupropion Hcl) .Marland Kitchen.. 1 By Mouth Once Daily (Generic - Starting The Second Month) 11)  Abilify 2 Mg Tabs (Aripiprazole) .Marland Kitchen.. 1po At Bedtime 12)  Buspirone Hcl 15 Mg Tabs (Buspirone Hcl) .Marland Kitchen.. 1po Three Times A Day As Needed Anxiety 13)  Avelox 400 Mg Tabs (Moxifloxacin Hcl) .Marland Kitchen.. 1po Once Daily  For 10 Days  Allergies (verified): 1)  ! Parafon Forte  Past History:  Past Medical History: Last updated: 07/05/2009 Anxiety Depression Diabetes mellitus, type II Hyperlipidemia Allergic rhinitis Lumbar spinal stenosis - dr Farris Has , then to dr Tressie Stalker E.D. 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  , mild ill  Head:  normocephalic and atraumatic.   Eyes:  vision grossly intact, pupils equal, and pupils round.   Ears:  bilat tm's red, sinus nontender Nose:  nasal dischargemucosal pallor and mucosal edema.   Mouth:  pharyngeal erythema and fair dentition.   Neck:  supple and cervical lymphadenopathy.   Lungs:  marked decrease RLL BS, with normal resp effort o/w normal BS Heart:  normal rate and regular rhythm.   Extremities:  no edema, no erythema    Impression & Recommendations:  Problem # 1:  COUGH (ICD-786.2) exam c/w at least bronchitis -  suspect RLL pna - for cxr, gave 10 days samples avelox  Orders: TLB-CBC Platelet - w/Differential (85025-CBCD)  T-2 View CXR, Same Day (71020.5TC)  Problem # 2:  DIABETES MELLITUS, TYPE II (ICD-250.00)  The following medications were removed from the medication list:    Glimepiride 4 Mg Tabs (Glimepiride) .Marland Kitchen... 2 by mouth each morning His updated medication list for this problem includes:    Adult Aspirin Ec Low Strength 81 Mg Tbec (Aspirin) .Marland Kitchen... 1po once daily    Glucophage Xr 500 Mg Xr24h-tab (Metformin hcl) .Marland Kitchen... 2 by mouth qam    Januvia 100 Mg Tabs (Sitagliptin phosphate) .Marland Kitchen... 1po once daily    Losartan Potassium 100 Mg Tabs (Losartan potassium) .Marland Kitchen... 1 by mouth once daily pt states actos too expensive  - to change to metformin, cont januvia, but stop the glimeparide as we are unsure of overall control;  to check labs today  Orders: TLB-A1C / Hgb A1C (Glycohemoglobin) (83036-A1C) TLB-BMP (Basic Metabolic Panel-BMET) (80048-METABOL) TLB-Lipid Panel (80061-LIPID)  Labs Reviewed: Creat: 0.9 (07/03/2009)    Reviewed HgBA1c results: 7.1 (07/03/2009)  9.5 (03/20/2009)  Problem # 3:  HYPERTENSION (ICD-401.9)  His updated medication list for this problem includes:    Losartan Potassium  100 Mg Tabs (Losartan potassium) .Marland Kitchen... 1 by mouth once daily  BP today: 132/80 Prior BP: 120/72 (09/26/2009)  Labs Reviewed: K+: 4.8 (07/03/2009) Creat: : 0.9 (07/03/2009)   Chol: 121 (07/03/2009)   HDL: 38.70 (07/03/2009)   LDL: 70 (07/03/2009)   TG: 63.0 (07/03/2009) stable overall by hx and exam, ok to continue meds/tx as is, encouraged compliccne with meds  Problem # 4:  DEPRESSIVE DISORDER (ICD-311)  His updated medication list for this problem includes:    Diazepam 5 Mg Tabs (Diazepam) .Marland Kitchen... 1 by mouth once daily as needed    Wellbutrin Xl 300 Mg Xr24h-tab (Bupropion hcl) .Marland Kitchen... 1 by mouth once daily (generic - starting the second month)    Buspirone Hcl 15 Mg Tabs (Buspirone hcl) .Marland Kitchen... 1po three times a day as needed anxiety encouraged re-start meds  Problem # 5:  ARM PAIN, LEFT (ICD-729.5) to cont further evaluation as planned  Complete Medication List: 1)  Adult Aspirin Ec Low Strength 81 Mg Tbec (Aspirin) .Marland Kitchen.. 1po once daily 2)  Glucophage Xr 500 Mg Xr24h-tab (Metformin hcl) .... 2 by mouth qam 3)  Optive 0.5-0.9 % Soln (Carboxymethylcellul-glycerin) .... Use asd per dr Emily Filbert 4)  Diazepam 5 Mg Tabs (Diazepam) .Marland Kitchen.. 1 by mouth once daily as needed 5)  Simvastatin 40 Mg Tabs (Simvastatin) .Marland Kitchen.. 1 by mouth once daily 6)  Januvia 100 Mg Tabs (Sitagliptin phosphate) .Marland Kitchen.. 1po once daily 7)  Lortab 10 10-500 Mg Tabs (Hydrocodone-acetaminophen) .Marland Kitchen.. 1 by mouth every 5 hours as needed 8)  Tylenol Extra Strength 500 Mg Tabs (Acetaminophen) .... Use asd per otc bottle 9)  Losartan Potassium 100 Mg Tabs (Losartan potassium) .Marland Kitchen.. 1 by mouth once daily 10)  Wellbutrin Xl 300 Mg Xr24h-tab (Bupropion hcl) .Marland Kitchen.. 1 by mouth once daily (generic - starting the second month) 11)  Abilify 2 Mg Tabs (Aripiprazole) .Marland Kitchen.. 1po at bedtime 12)  Buspirone Hcl 15 Mg Tabs (Buspirone hcl) .Marland Kitchen.. 1po three times a day as needed anxiety 13)  Avelox 400 Mg Tabs (Moxifloxacin hcl) .Marland Kitchen.. 1po once daily  for 10  days  Patient Instructions: 1)  take the avelox antibiotic at 1 pill per day for 10 days (with food is better) 2)  stop the actos 3)  stop the glimeparide 4)  start the metformin 500 mg - 2 in the am 5)  Continue all previous medications as before  this visit  6)  Please schedule a follow-up appointment in 3 months with CPX labs and: 7)  HbgA1C prior to visit, ICD-9: 250.02 8)  Urine Microalbumin prior to visit, ICD-9: Prescriptions: GLUCOPHAGE XR 500 MG XR24H-TAB (METFORMIN HCL) 2 by mouth qam  #180 x 3   Entered and Authorized by:   Corwin Levins MD   Signed by:   Corwin Levins MD on 05/10/2010   Method used:   Print then Give to Patient   RxID:   [id number]

## 2010-12-20 NOTE — Progress Notes (Signed)
Summary: EXERCISE RX  Phone Note Call from Patient Call back at Home Phone [phone]   Caller: [facility] Summary of Call: Jose Patrick WANTS AN RX STATING THAT HE IS OK TO USE THE EXERCISE EQUIPMENT AND POOL AT [facility] [employer] [facility] [employer]. Initial call taken by: Hilarie Fredrickson,  June 14, 2010 11:46 AM  Follow-up for Phone Call        ok - to robin to handle please Follow-up by: Corwin Levins MD,  June 14, 2010 12:33 PM  Additional Follow-up for Phone Call Additional follow up Details #1::        did rx for pt. called pt to inform will mail, left msg. to call back. Additional Follow-up by: Robin Ewing CMA Duncan Dull),  June 14, 2010 2:02 PM    Additional Follow-up for Phone Call Additional follow up Details #2::    called patient back on his cell at [phone] and informed of above information. Follow-up by: Zella Ball Ewing CMA Duncan Dull),  June 14, 2010 3:33 PM

## 2010-12-20 NOTE — Letter (Signed)
Summary: [facility]  [facility]   Imported By: Lester Salem 01/02/2010 08:04:03  _____________________________________________________________________  External Attachment:    Type:   Image     Comment:   External Document

## 2010-12-20 NOTE — Assessment & Plan Note (Signed)
Summary: PER DR JWJ OK TO WORK IN EARLIER DAY-PHYSICAL-[facility]   Vital Signs:  Patient profile:   60 year old male Height:      68 inches Weight:      215 pounds BMI:     32.81 O2 Sat:      95 % on Room air Temp:     98.7 degrees F oral Pulse rate:   84 / minute BP sitting:   130 / 68  (left arm) Cuff size:   large  Vitals Entered By: Zella Ball Ewing CMA Duncan Dull) (September 28, 2010 2:50 PM)  O2 Flow:  Room air  CC: Adult Physical/RE   CC:  Adult Physical/RE.  History of Present Illness: here for wellness and f/u ;  unfortunately back to smoking with increased stressors;  to se Dr Donell Beers next wk/psychiatry.  Has had cough for 5 mo's with thick sputum, hard to get up, no fever, colored sputum, wheezing, sob, CP. Fatigue from last illness requiring antibx in june 2011 much improved.  Has marked financial issues, still getting bills from his previous surgury.  Pt denies CP, worsening sob, doe, wheezing, orthopnea, pnd, worsening LE edema, palps, dizziness or syncope  Pt denies new neuro symptoms such as headache, facial or extremity weakness  Pt denies polydipsia, polyuria, or low sugar symptoms such as shakiness improved with eating.  Overall good compliance with meds, trying to follow low chol, DM diet, wt stable, little excercise however No fever, wt loss, night sweats, loss of appetite or other constitutional symptoms Denies worsening depressive symptoms, suicidal ideation, or panic.   Pt states good ability with ADL's, low fall risk, home safety reviewed and adequate, no significant change in hearing or vision, trying to follow lower chol diet, and occasionally active only with regular excercise.   Preventive Screening-Counseling & Management      Drug Use:  no.    Problems Prior to Update: 1)  Benign Prostatic Hypertrophy  (ICD-600.00) 2)  Cough  (ICD-786.2) 3)  Chest Pain  (ICD-786.50) 4)  Depression, Major  (ICD-296.20) 5)  Fecal Incontinence  (ICD-787.6) 6)  Skin Lesion   (ICD-709.9) 7)  Erectile Dysfunction  (ICD-302.72) 8)  Hypertension  (ICD-401.9) 9)  Allergic Conjunctivitis  (ICD-372.14) 10)  Elevated Blood Pressure Without Diagnosis of Hypertension  (ICD-796.2) 11)  Preventive Health Care  (ICD-V70.0) 12)  Sinusitis- Acute-nos  (ICD-461.9) 13)  Nausea  (ICD-787.02) 14)  Vertigo, Intermittent  (ICD-780.4) 15)  Otitis Media, Acute, Bilateral  (ICD-382.9) 16)  Back Pain  (ICD-724.5) 17)  Preventive Health Care  (ICD-V70.0) 18)  Impotence of Organic Origin  (ICD-607.84) 19)  Routine General Medical Exam@health  Care Facl  (ICD-V70.0) 20)  Depressive Disorder  (ICD-311) 21)  Arm Pain, Left  (ICD-729.5) 22)  Colonic Polyps, Hx of  (ICD-V12.72) 23)  Erectile Dysfunction  (ICD-302.72) 24)  Spinal Stenosis, Lumbar  (ICD-724.02) 25)  Allergic Rhinitis  (ICD-477.9) 26)  Hyperlipidemia  (ICD-272.4) 27)  Diabetes Mellitus, Type II  (ICD-250.00) 28)  Elective Surgery, Routine/ritual Circumcision  (ICD-V50.2) 29)  Constipation, Chronic, Hx of  (ICD-V12.79) 30)  Depression  (ICD-311) 31)  Anxiety  (ICD-300.00)  Medications Prior to Update: 1)  Adult Aspirin Ec Low Strength 81 Mg Tbec (Aspirin) .Marland Kitchen.. 1po Once Daily 2)  Glucophage Xr 500 Mg Xr24h-Tab (Metformin Hcl) .... 2 By Mouth Qam 3)  Optive 0.5-0.9 % Soln (Carboxymethylcellul-Glycerin) .... Use Asd Per Dr Emily Filbert 4)  Diazepam 5 Mg Tabs (Diazepam) .Marland Kitchen.. 1 By Mouth Once Daily As Needed 5)  Simvastatin 40 Mg Tabs (Simvastatin) .Marland Kitchen.. 1 By Mouth Once Daily 6)  Januvia 100 Mg Tabs (Sitagliptin Phosphate) .Marland Kitchen.. 1po Once Daily 7)  Lortab 10 10-500 Mg Tabs (Hydrocodone-Acetaminophen) .Marland Kitchen.. 1 By Mouth Every 5 Hours As Needed 8)  Tylenol Extra Strength 500 Mg Tabs (Acetaminophen) .... Use Asd Per Otc Bottle 9)  Losartan Potassium 100 Mg Tabs (Losartan Potassium) .Marland Kitchen.. 1 By Mouth Once Daily 10)  Wellbutrin Xl 300 Mg Xr24h-Tab (Bupropion Hcl) .Marland Kitchen.. 1 By Mouth Once Daily (Generic - Starting The Second Month) 11)  Abilify 2 Mg  Tabs (Aripiprazole) .Marland Kitchen.. 1po At Bedtime 12)  Buspirone Hcl 15 Mg Tabs (Buspirone Hcl) .Marland Kitchen.. 1po Three Times A Day As Needed Anxiety 13)  Avelox 400 Mg Tabs (Moxifloxacin Hcl) .Marland Kitchen.. 1po Once Daily  For 10 Days  Current Medications (verified): 1)  Adult Aspirin Ec Low Strength 81 Mg Tbec (Aspirin) .Marland Kitchen.. 1po Once Daily 2)  Optive 0.5-0.9 % Soln (Carboxymethylcellul-Glycerin) .... Use Asd Per Dr Emily Filbert 3)  Simvastatin 40 Mg Tabs (Simvastatin) .Marland Kitchen.. 1 By Mouth By Mouth At Bedtime 4)  Janumet 50-500 Mg Tabs (Sitagliptin-Metformin Hcl) .Marland Kitchen.. 1po Two Times A Day 5)  Lortab 10 10-500 Mg Tabs (Hydrocodone-Acetaminophen) .Marland Kitchen.. 1 By Mouth Every 5 Hours As Needed 6)  Tylenol Extra Strength 500 Mg Tabs (Acetaminophen) .... Use Asd Per Otc Bottle 7)  Losartan Potassium 100 Mg Tabs (Losartan Potassium) .Marland Kitchen.. 1 By Mouth Once Daily 8)  Zoloft 100 Mg Tabs (Sertraline Hcl) .... 1/2 By Mouth Every Day For 6 Days and Then 1 By Mouth Once Daily  Allergies (verified): 1)  ! 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         all otherwise negative per pt -    Physical Exam  General:  alert and overweight-appearing. Head:  normocephalic and atraumatic.   Eyes:  vision grossly intact, pupils equal, and pupils round.   Ears:  R ear normal and L ear normal.   Nose:  no external deformity and no nasal discharge.   Mouth:  no gingival abnormalities and pharynx pink and moist.   Neck:  supple and no masses.   Lungs:  normal respiratory effort and normal breath sounds.  Heart:  normal rate and regular rhythm.   Abdomen:  soft, non-tender, and normal bowel sounds.   Msk:  no joint tenderness and no joint swelling.   Extremities:  no edema, no erythema  Neurologic:  cranial nerves II-XII intact, strength normal in all extremities, and sensation intact to light touch.   Skin:  color normal and no rashes.   Psych:  dysphoric affect and moderately anxious.     Impression & Recommendations:  Problem # 1:  Preventive Health Care (ICD-V70.0) Overall doing well, age appropriate education and counseling updated, referral for preventive services and immunizations addressed, dietary counseling and smoking status adressed , most recent labs reviewed, ecg reviewed I have personally reviewed and have noted 1.The patient's medical and social history 2.Their use of alcohol, tobacco or illicit drugs 3.Their current medications and  supplements 4. Functional ability including ADL's, fall risk, home safety risk, hearing & visual impairment  5.Diet and physical activities 6.Evidence for depression or mood disorders The patients weight, height, BMI  have been recorded in the chart I have made referrals, counseling and provided education to the patient based review of the above  Orders: EKG w/ Interpretation (93000)  Problem # 2:  COUGH (ICD-786.2) for mucinex otc as needed   Problem # 3:  HYPERTENSION (ICD-401.9)  His updated medication list for this problem includes:    Losartan Potassium 100 Mg Tabs (Losartan potassium) .Marland Kitchen... 1 by mouth once daily  BP today: 130/68 Prior BP: 132/80 (05/10/2010)  Labs Reviewed: K+: 5.2 (09/21/2010) Creat: : 0.9 (09/21/2010)   Chol: 198 (09/21/2010)   HDL: 42.20 (09/21/2010)   LDL: 138 (09/21/2010)   TG: 90.0 (09/21/2010) stable overall by hx and exam, ok to continue meds/tx as is   Problem # 4:  DIABETES MELLITUS, TYPE II (ICD-250.00)  The following medications were removed from the medication list:    Glucophage Xr 500 Mg Xr24h-tab (Metformin hcl) .Marland Kitchen... 2 by mouth in the am His updated medication list for this problem includes:    Adult Aspirin Ec Low Strength 81 Mg Tbec (Aspirin) .Marland Kitchen... 1po once daily    Janumet 50-500 Mg Tabs (Sitagliptin-metformin hcl) .Marland Kitchen... 1po two times a day    Losartan Potassium 100 Mg Tabs (Losartan potassium) .Marland Kitchen... 1 by mouth once daily treat as above, f/u any worsening signs or symptoms   Labs Reviewed: Creat: 0.9 (09/21/2010)    Reviewed HgBA1c results: 7.9 (05/10/2010)  7.1 (07/03/2009)  Problem # 5:  HYPERLIPIDEMIA (ICD-272.4)  His updated medication list for this problem includes:    Simvastatin 40 Mg Tabs (Simvastatin) .Marland Kitchen... 1 by mouth by mouth at bedtime  Labs Reviewed: SGOT: 15 (09/21/2010)   SGPT: 14 (09/21/2010)   HDL:42.20 (09/21/2010), 49.10 (05/10/2010)  LDL:138 (09/21/2010), 108 (05/10/2010)  Chol:198 (09/21/2010), 174  (05/10/2010)  Trig:90.0 (09/21/2010), 85.0 (05/10/2010) stable overall by hx and exam, ok to continue meds/tx as is   Complete Medication List: 1)  Adult Aspirin Ec Low Strength 81 Mg Tbec (Aspirin) .Marland Kitchen.. 1po once daily 2)  Optive 0.5-0.9 % Soln (Carboxymethylcellul-glycerin) .... Use asd per dr Emily Filbert 3)  Simvastatin 40 Mg Tabs (Simvastatin) .Marland Kitchen.. 1 by mouth by mouth at bedtime 4)  Janumet 50-500 Mg Tabs (Sitagliptin-metformin hcl) .Marland Kitchen.. 1po two times a day 5)  Lortab 10 10-500 Mg Tabs (Hydrocodone-acetaminophen) .Marland Kitchen.. 1 by mouth every 5 hours as needed 6)  Tylenol Extra Strength 500 Mg Tabs (Acetaminophen) .... Use asd per otc bottle 7)  Losartan Potassium 100 Mg Tabs (Losartan potassium) .Marland KitchenMarland KitchenMarland Kitchen 1  by mouth once daily 8)  Zoloft 100 Mg Tabs (Sertraline hcl) .... 1/2 by mouth every day for 6 days and then 1 by mouth once daily  Other Orders: Admin 1st Vaccine (16109) Flu Vaccine 81yrs + (60454)  Patient Instructions: 1)  start the simvastatin at 40 mg per day 2)  you had the flu shot today 3)  stop the [place] you are taking 4)  stop the metformin (glucophage) you are taking 5)  star the janumet twice per day with the new discount card 6)  You can also use Mucinex OTC or it's generic for congestion  7)  Continue all previous medications as before this visit  8)  Please schedule a follow-up appointment in 6 months with: 9)  BMP prior to visit, ICD-9: 250.02 10)  Lipid Panel prior to visit, ICD-9: 11)  HbgA1C prior to visit, ICD-9: Prescriptions: LOSARTAN POTASSIUM 100 MG TABS (LOSARTAN POTASSIUM) 1 by mouth once daily  #90 x 3   Entered and Authorized by:   Corwin Levins MD   Signed by:   Corwin Levins MD on 09/28/2010   Method used:   Print then Give to Patient   RxID:   [id number] JANUMET 50-500 MG TABS (SITAGLIPTIN-METFORMIN HCL) 1po two times a day  #90 x 3   Entered and Authorized by:   Corwin Levins MD   Signed by:   Corwin Levins MD on 09/28/2010   Method used:   Print then Give  to Patient   RxID:   [id number] SIMVASTATIN 40 MG TABS (SIMVASTATIN) 1 by mouth by mouth at bedtime  #90 x 3   Entered and Authorized by:   Corwin Levins MD   Signed by:   Corwin Levins MD on 09/28/2010   Method used:   Print then Give to Patient   RxID:   [id number] GLUCOPHAGE XR 500 MG XR24H-TAB (METFORMIN HCL) 2 by mouth in the am  #180 x 3   Entered and Authorized by:   Corwin Levins MD   Signed by:   Corwin Levins MD on 09/28/2010   Method used:   Print then Give to Patient   RxID:   [id number] SIMVASTATIN 40 MG TABS (SIMVASTATIN) 1 by mouth once daily  #90 x 3   Entered and Authorized by:   Corwin Levins MD   Signed by:   Corwin Levins MD on 09/28/2010   Method used:   Print then Give to Patient   RxID:   [id number]    Orders Added: 1)  Admin 1st Vaccine [90471] 2)  Flu Vaccine 77yrs + [30160] 3)  EKG w/ Interpretation [93000] 4)  Est. Patient 40-64 years [10932]   Flu Vaccine Consent Questions     Do you have a history of severe allergic reactions to this vaccine? no    Any prior history of allergic reactions to egg and/or gelatin? no    Do you have a sensitivity to the preservative Thimersol? no    Do you have a past history of Guillan-Barre Syndrome? no    Do you currently have an acute febrile illness? no    Have you ever had a severe reaction to latex? no    Vaccine information given and explained to patient? yes    Are you currently pregnant? no    Lot Number:[id number]   Exp Date:05/18/2011   Site Given  Left Deltoid [id number]

## 2010-12-20 NOTE — Progress Notes (Signed)
  Phone Note Refill Request  on February 19, 2010 9:35 AM  Refills Requested: Medication #1:  GLIMEPIRIDE 4 MG TABS 2 by mouth each morning   Dosage confirmed as above?Dosage Confirmed   Notes: Nicolette Bang, [place] Initial call taken by: Scharlene Gloss,  February 19, 2010 9:35 AM    Prescriptions: GLIMEPIRIDE 4 MG TABS (GLIMEPIRIDE) 2 by mouth each morning  #60 x 8   Entered by:   Scharlene Gloss   Authorized by:   Corwin Levins MD   Signed by:   Scharlene Gloss on 02/19/2010   Method used:   Faxed to ...       [facility] [address] [phone]* (retail)       [address]       [place], [place]  [address]       Ph: [phone]       Fax: [phone]   RxID:   [id number] SIMVASTATIN 40 MG TABS (SIMVASTATIN) 1 by mouth once daily  #30 x 8   Entered by:   Scharlene Gloss   Authorized by:   Corwin Levins MD   Signed by:   Scharlene Gloss on 02/19/2010   Method used:   Faxed to ...       [facility] [address] [phone]* (retail)       [address]       [place], [place]  [address]       Ph: [phone]       Fax: [phone]   RxID:   [id number]

## 2010-12-20 NOTE — Letter (Signed)
Summary: [facility]  [facility]   Imported By: Lester Dearborn 04/23/2010 09:56:40  _____________________________________________________________________  External Attachment:    Type:   Image     Comment:   External Document

## 2010-12-20 NOTE — Letter (Signed)
Summary: [facility]  [facility]   Imported By: Sherian Rein 02/20/2010 13:49:48  _____________________________________________________________________  External Attachment:    Type:   Image     Comment:   External Document

## 2010-12-20 NOTE — Progress Notes (Signed)
  Phone Note Refill Request  on February 05, 2010 9:32 AM  Refills Requested: Medication #1:  LOSARTAN POTASSIUM 100 MG TABS 1 by mouth once daily   Dosage confirmed as above?Dosage Confirmed   Notes: [facility] Initial call taken by: Scharlene Gloss,  February 05, 2010 9:32 AM    Prescriptions: LOSARTAN POTASSIUM 100 MG TABS (LOSARTAN POTASSIUM) 1 by mouth once daily  #30 x 4   Entered by:   Scharlene Gloss   Authorized by:   Corwin Levins MD   Signed by:   Scharlene Gloss on 02/05/2010   Method used:   Faxed to ...       [facility] [address] [phone]* (retail)       [address]       [place], [place]  [address]       Ph: [phone]       Fax: [phone]   RxID:   [id number]

## 2011-02-05 LAB — GLUCOSE, CAPILLARY: Glucose-Capillary: 117 mg/dL — ABNORMAL HIGH (ref 70–99)

## 2011-02-27 LAB — BASIC METABOLIC PANEL
BUN: 16 mg/dL (ref 6–23)
Creatinine, Ser: 0.9 mg/dL (ref 0.4–1.5)
GFR calc non Af Amer: >60 mL/min (ref >60)
Potassium: 5.1 mEq/L (ref 3.5–5.1)

## 2011-02-27 LAB — CBC
RDW: 13.8 % (ref 11.5–15.5)
WBC: 8.3 10*3/uL (ref 4.0–10.5)

## 2011-02-27 LAB — GLUCOSE, CAPILLARY
Glucose-Capillary: 159 mg/dL — ABNORMAL HIGH (ref 70–99)
Glucose-Capillary: 174 mg/dL — ABNORMAL HIGH (ref 70–99)
Glucose-Capillary: 246 mg/dL — ABNORMAL HIGH (ref 70–99)

## 2011-03-04 LAB — GLUCOSE, CAPILLARY
Glucose-Capillary: 198 mg/dL — ABNORMAL HIGH (ref 70–99)
Glucose-Capillary: 209 mg/dL — ABNORMAL HIGH (ref 70–99)

## 2011-03-05 LAB — BASIC METABOLIC PANEL WITH GFR
BUN: 11 mg/dL (ref 6–23)
CO2: 26 meq/L (ref 19–32)
Calcium: 9.1 mg/dL (ref 8.4–10.5)
Chloride: 101 meq/L (ref 96–112)
Creatinine, Ser: 1 mg/dL (ref 0.4–1.5)
GFR calc non Af Amer: >60 mL/min
Glucose, Bld: 161 mg/dL — ABNORMAL HIGH (ref 70–99)
Potassium: 4 meq/L (ref 3.5–5.1)
Sodium: 135 meq/L (ref 135–145)

## 2011-03-05 LAB — GLUCOSE, CAPILLARY
Glucose-Capillary: 111 mg/dL — ABNORMAL HIGH (ref 70–99)
Glucose-Capillary: 137 mg/dL — ABNORMAL HIGH (ref 70–99)
Glucose-Capillary: 138 mg/dL — ABNORMAL HIGH (ref 70–99)
Glucose-Capillary: 140 mg/dL — ABNORMAL HIGH (ref 70–99)
Glucose-Capillary: 145 mg/dL — ABNORMAL HIGH (ref 70–99)
Glucose-Capillary: 146 mg/dL — ABNORMAL HIGH (ref 70–99)
Glucose-Capillary: 162 mg/dL — ABNORMAL HIGH (ref 70–99)
Glucose-Capillary: 166 mg/dL — ABNORMAL HIGH (ref 70–99)
Glucose-Capillary: 172 mg/dL — ABNORMAL HIGH (ref 70–99)
Glucose-Capillary: 194 mg/dL — ABNORMAL HIGH (ref 70–99)
Glucose-Capillary: 224 mg/dL — ABNORMAL HIGH (ref 70–99)

## 2011-03-05 LAB — TYPE AND SCREEN: Antibody Screen: NEGATIVE

## 2011-03-05 LAB — CBC
HCT: 40.2 % (ref 39.0–52.0)
Hemoglobin: 13.6 g/dL (ref 13.0–17.0)
Hemoglobin: 16.6 g/dL (ref 13.0–17.0)
MCHC: 33.8 g/dL (ref 30.0–36.0)
MCV: 83.9 fL (ref 78.0–100.0)
Platelets: 233 K/uL (ref 150–400)
RBC: 4.79 MIL/uL (ref 4.22–5.81)
RDW: 13.3 % (ref 11.5–15.5)
RDW: 13.6 % (ref 11.5–15.5)
WBC: 10.7 10*3/uL — ABNORMAL HIGH (ref 4.0–10.5)
WBC: 14.8 K/uL — ABNORMAL HIGH (ref 4.0–10.5)

## 2011-03-05 LAB — BASIC METABOLIC PANEL
Chloride: 101 mEq/L (ref 96–112)
GFR calc non Af Amer: >60 mL/min (ref >60)
Glucose, Bld: 176 mg/dL — ABNORMAL HIGH (ref 70–99)
Potassium: 4.2 mEq/L (ref 3.5–5.1)
Sodium: 135 mEq/L (ref 135–145)

## 2011-03-05 LAB — ABO/RH: ABO/RH(D): O POS

## 2011-03-22 ENCOUNTER — Other Ambulatory Visit: Payer: Self-pay

## 2011-03-22 ENCOUNTER — Other Ambulatory Visit: Payer: Self-pay | Admitting: Internal Medicine

## 2011-03-22 DIAGNOSIS — IMO0001 Reserved for inherently not codable concepts without codable children: Secondary | ICD-10-CM

## 2011-03-28 ENCOUNTER — Other Ambulatory Visit: Payer: Self-pay

## 2011-03-29 ENCOUNTER — Ambulatory Visit: Payer: Self-pay | Admitting: Internal Medicine

## 2011-03-31 ENCOUNTER — Encounter: Payer: Self-pay | Admitting: Internal Medicine

## 2011-03-31 DIAGNOSIS — Z0000 Encounter for general adult medical examination without abnormal findings: Secondary | ICD-10-CM | POA: Insufficient documentation

## 2011-03-31 DIAGNOSIS — Z7189 Other specified counseling: Secondary | ICD-10-CM | POA: Insufficient documentation

## 2011-04-01 ENCOUNTER — Other Ambulatory Visit: Payer: Self-pay

## 2011-04-02 ENCOUNTER — Other Ambulatory Visit (INDEPENDENT_AMBULATORY_CARE_PROVIDER_SITE_OTHER): Payer: Self-pay

## 2011-04-02 DIAGNOSIS — IMO0001 Reserved for inherently not codable concepts without codable children: Secondary | ICD-10-CM

## 2011-04-02 LAB — BASIC METABOLIC PANEL
BUN: 12 mg/dL (ref 6–23)
CO2: 31 mEq/L (ref 19–32)
Chloride: 104 mEq/L (ref 96–112)
Creatinine, Ser: 1 mg/dL (ref 0.4–1.5)

## 2011-04-02 LAB — LIPID PANEL
Cholesterol: 173 mg/dL (ref 0–200)
LDL Cholesterol: 114 mg/dL — ABNORMAL HIGH (ref 0–99)
Total CHOL/HDL Ratio: 4

## 2011-04-02 NOTE — Op Note (Signed)
NAMECLINE, DRAHEIM NO.:  [id number]   MEDICAL RECORD NO.:  [id number]          PATIENT TYPE:  OIB   LOCATION:  3535                         FACILITY:  [facility]   PHYSICIAN:  Cristi Loron, M.D.DATE OF BIRTH:  02-Apr-1951   DATE OF PROCEDURE:  03/01/2009  DATE OF DISCHARGE:                               OPERATIVE REPORT   BRIEF HISTORY:  The patient is a 60 year old black male who has suffered  from neck and arm pain consistent with a T5 and C6 radiculopathy.  He  failed medical management, he was worked up with cervical MRI which  demonstrated patient has spondylosis at multiple levels including C4-5  and C5-6.  I discussed the various treatment options with the patient  including surgery.  He has weighed the risks, benefits, and alternatives  of procedure and decided to proceed with the C4-5, C5-6 anterior  cervical diskectomy fusion plating.   PREOPERATIVE DIAGNOSES:  C4-5, C5-6 spondylosis, degenerative disc  disease, stenosis, cervicalgia, cervical radiculopathy.   POSTOPERATIVE DIAGNOSES:  C4-5, C5-6 spondylosis, degenerative disc  disease, stenosis, cervicalgia, cervical radiculopathy.   PROCEDURE:  C4-5 and C5-6 extensive anterior cervical diskectomy, sinus  decompressed; C4-5, C5-6 anterior interbody arthrodesis with local  morselized autograft bone and Actifuse bone graft extender; insertion of  C4-5 and C5-6 interbody prosthesis (PEEK interbody prosthesis); C4-C6  anterior cervical plating with Codman SLIM-LOC titanium plate and  screws.   SURGEON:  Cristi Loron, M.D.   ASSISTANT:  Hewitt Shorts, M.D.   ANESTHESIA:  General endotracheal.   ESTIMATED BLOOD LOSS:  100 mL.   SPECIMENS:  None.   DRAINS:  One prevertebral Jackson-Pratt drain.   COMPLICATIONS:  None.   PROCEDURE:  The patient was brought to the operating room by anesthesia  team.  General tracheal anesthesia was induced.  The patient remained in  a supine  position.  A roll was placed under his shoulders and placed  neck in slight extension.  His anterior cervical region was shaved with  clippers and prepared with Betadine scrub and Betadine solution.  Sterile drains were applied.   I then injected the area to be incised with Marcaine with epinephrine  solution.  I used a scalpel to make a transverse incision in the  patient's left anterior neck.  I used Metzenbaum scissors to divide the  platysma muscle and then to dissect medial to sternocleidomastoid  muscle, jugular vein, and carotid artery.  I carefully dissected down  towards the anterior cervical spine, carefully identifying the esophagus  and retracting it medially and the skin and then used Kittner swabs to  clear soft tissue from the anterior cervical spine and then we inserted  a bent spinal needle into the upper exposed intervertebral disk space.  We then obtained intraoperative radiograph to confirm location.   We then used electrocautery to detach the medial border of the longus  colli muscle bilaterally from C4-5, C5-6 intervertebral disk space.  We  then inserted the Caspar self-retaining retractor underneath the longus  colli muscle bilaterally to provide exposure.   We then began decompression by incising the C5-6  intervertebral.  The  disk space was quite spondylotic and collapsed.  We inserted distraction  screws at C5 and C6 distracted the interspace.  I then used a high-speed  drill to decorticate the vertebral endplates at C5-6, drill away the  remainder of C5-6 intervertebral disk, to drill away some posterior  spondylosis and to thin out the posterior longitudinal ligament.  We  then incised the ligament with arachnoid knife and removed with Kerrison  punch undercutting the prevertebral endplates, decompressing the thecal  sac.  We then performed foraminotomies about the bilateral C6 nerve root  completing the decompression at this level.   We then repeated this  procedure in analogous fashion at C4-5,  decompressing the thecal sac and bilateral C5 nerve roots.   Having completed decompression we now turned attention to arthrodesis.  We used trial spacers and determined using 6-mm medium interbody  prosthesis.  We prefilled this prosthesis with a combination of local  morselized autograft bone, we obtained decompression as well as  Actifuse bone graft extender.  We inserted the prosthesis and distracted  C4-5, C5-6 interspaces and then removed distraction screws.  There was  good snug fit of prosthesis at both levels.   Having completed the arthrodesis and insertion of the prosthesis, we now  turned attention to the anterior spinal instrumentation.  We used high-  speed drill to remove some ventral spondylosis from the vertebral  endplates at C4-5, C5-6, so that the plate will lay down flat.  We  selected appropriate length Codman SLIM-LOC titanium plate and screws.  We laid it along the anterior aspect of the vertebral bodies from C4-C6.  We then drilled two 14 mm holes at C4, C5, and C6.  We then secured the  plate to their vertebral bodies by placing two 14 mm screws at C4, to a  C5, to a C6.  We then obtained intraoperative radiograph that  demonstrated good position of the upper plate, screws, interbody  prosthesis with limited visualization of lower plate and screws because  of the patient's body habitus, but the construct looked good in vivo.  We therefore secured these screws and plate by locking each cam.  This  completed the instrumentation.   We then obtained hemostasis using bipolar cautery.  We irrigated the  wound out with bacitracin solution.  We removed the retractors and then  we inspected esophagus for any damage; there was none apparent.  We then  reapproximated the patient's platysma muscle with interrupted 3-0 Vicryl  suture, subcutaneous tissue with interrupted 3-0 Vicryl suture, and skin  with Steri-Strips and Benzoin.   The wound was then coated with  bacitracin ointment.  Sterile dressing applied.  The drapes were  removed.  The patient subsequently extubated by anesthesia team and  transported to Postanesthesia Care Unit in stable condition.  All  sponge, instrument, and needle counts were correct at the end of the  case.     Cristi Loron, M.D.  Electronically Signed    JDJ/MEDQ  D:  03/01/2009  T:  03/02/2009  Job:  [id number]

## 2011-04-02 NOTE — Op Note (Signed)
NAMEBUD, KAESER NO.:  [id number]   MEDICAL RECORD NO.:  [id number]          PATIENT TYPE:  INP   LOCATION:  3012                         FACILITY:  [facility]   PHYSICIAN:  Cristi Loron, M.D.DATE OF BIRTH:  12-06-1950   DATE OF PROCEDURE:  12/26/2008  DATE OF DISCHARGE:                               OPERATIVE REPORT   BRIEF HISTORY:  The patient is a 60 year old black male who has suffered  from hip and leg pain consistent with neurogenic claudication.  He  failed medical management and worked up with a lumbar myelo CT, which  demonstrated the patient had a spondylolisthesis with severe stenosis at  L4-5.  I discussed the various treatment options with the patient  including surgery.  He has weighed the risks, benefits and alternatives  of surgery and decided to proceed with an L4-5 decompression,  instrumentation, and fusion.   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   PROCEDURE:  Bilateral L4 and L5 laminotomies, foraminotomies to  decompress the bilateral L4 and L5 nerve roots; L4-5 posterior lumbar  interbody fusion with local morselized autograft bone and  Actifuse/Vitoss bone graft extender; insertion of L4-5 interbody  prosthesis (Capstone PEEK interbody prosthesis); L4-5 posterior  nonsegmental instrumentation with Legacy titanium pedicle screws and  rods; L4-5 posterolateral arthrodesis with local morselized autograft  bone, Vitoss and Actifuse bone graft extender.   SURGEON:  Cristi Loron, MD   ASSISTANT:  Stefani Dama, MD.   ANESTHESIA:  General endotracheal.   ESTIMATED BLOOD LOSS:  200 mL.   SPECIMENS:  None.   DRAINS:   None.   COMPLICATIONS:  None.   DESCRIPTION OF PROCEDURE:  The patient was brought to the operating room  by the anesthesia team.  General tracheal anesthesia was induced.  The  patient was turned to the prone position on Wilson frame.  His  lumbosacral region was then prepared with Betadine scrub and Betadine  solution.  Sterile drapes were applied and then injected the area to be  incised with Marcaine with epinephrine solution and used a scalpel to  make a linear midline incision over the L4-5 interspace and used  electrocautery to perform a bilateral subperiosteal dissection exposing  the spinous process and lamina of L4 and L5.  We obtained an  intraoperative radiograph to confirm our location.   We then inserted the Versatrac retractor for exposure.  Of note, because  of the patient's spondylolisthesis and severe facet arthropathy and  spinal stenosis, a wide decompression was required which was in excess  of the laminotomy required to do the posterior lumbar interbody fusion.  We used high-speed drill to perform bilateral L4 laminotomies.  We  widened the laminotomies with Kerrison punch, removing the L4-5  ligamentum flavum, removing the cephalad aspect of the L5 lamina.  We  performed wide foraminotomies about the L4 and L5 nerve roots  bilaterally  completing the decompression.   We now turned attention to posterior lumbar interbody fusion.  We  incised the L4-5 intervertebral disks bilaterally and performed a  bilateral partial intervertebral diskectomy.  We then prepared the  vertebral endplates using the curettes, clearing soft tissues from the  vertebral endplates at L4 and L5.  We used trial spacers and determined  to use a 12 x 22 mm PEEK interbody prosthesis.  We prefilled this  prosthesis with combination of local morselized autograft bone and  Actifuse/Vitoss bone graft extenders.  We inserted the prosthesis into  the L4-5 interspace, of course, after retracting the  neural structures  out of harm's way.  I should also mention we filled the anterior,  medial, and laterally to the prosthesis with local autograft bone and  Actifuse and Vitoss bone graft extenders, completing the posterior  lumbar interbody fusion.   We now turned our attention to the spinal instrumentation.  Under  fluoroscopic guidance, we cannulated the bilateral L4 and L5 pedicles  with a bone probe.  We tapped the pedicles with a 5.5-mm tap.  We then  probed the inside of the tap pedicles to rule out cortical breaches.  We  inserted 6.5 x 50 mm screws bilaterally at L4-L5 under fluoroscopic  guidance.  We then palpated along the medial aspect of the pedicles and  noted there was no cortical breeches.  We then connected the unilateral  pedicle screws with a lordotic rod.  We compressed the construct and  then secured the rod in place with the caps, which we tightened  appropriately.  This completed the instrumentation.   We now turned our attention to the posterolateral arthrodesis.  We used  high-speed drill to decorticate the remainder of the L4-5 facets and the  pars region, as well as transverse processes.  We then laid a  combination of local morselized autograft bone and Actifuse/ Vitoss bone  graft extenders over these decorticated posterolateral structures.  This  completed the posterolateral arthrodesis.   We then obtained hemostasis using bipolar cautery.  We irrigated the  wound out with bacitracin solution.  We then inspected the thecal sac  and the bilateral L4 and L5 nerve roots for any neural compression;  there was none.  We then removed the retractors and reapproximated the  patient's thoracolumbar fascia with interrupted #1 Vicryl suture, the  subcutaneous tissue with interrupted 2-0 Vicryl suture, and the skin  with Steri-Strips and Benzoin.  The wound was then coated with  bacitracin ointment and sterile dressing was applied.  The drapes were  removed and the  patient was subsequently returned to supine position  where he was extubated by anesthesia team and transported to the post  anesthesia care unit in stable condition.  All sponge, instrument and  needle counts were correct at the end of this case.      Cristi Loron, M.D.  Electronically Signed     JDJ/MEDQ  D:  12/26/2008  T:  12/27/2008  Job:  [id number]

## 2011-04-02 NOTE — Op Note (Signed)
[name], [name]              ACCOUNT NO.:  [id number]   MEDICAL RECORD NO.:  [id number]          PATIENT TYPE:  AMB   LOCATION:  [facility]C                         FACILITY:  [facility]   PHYSICIAN:  Excell Seltzer. Annabell Howells, M.D.    DATE OF BIRTH:  05-27-1951   DATE OF PROCEDURE:  03/17/2008  DATE OF DISCHARGE:                               OPERATIVE REPORT   PREOPERATIVE DIAGNOSIS:  Recurrent left hydrocele.   POSTOPERATIVE DIAGNOSIS:  Recurrent left hydrocele.   PROCEDURE:  Redo left hydrocelectomy.   ATTENDING PHYSICIAN:  Dr. Bjorn Pippin.   RESIDENT PHYSICIAN:  Dr. Maudie Flakes.   ANESTHESIA:  General plus local.   INDICATIONS FOR PROCEDURE:  [name] is a 60 year old, African-  American male with a past medical history positive for symptomatic left  hydrocele.  He underwent open hydrocelectomy in February 2009.  He did  well initially but after a few weeks his hydrocele recurred. It once  again became symptomatic and is actually larger now than it was prior to  his initial hydrocelectomy. This has been no fevers, no other  complaints.  Preoperatively he and Dr. Annabell Howells spoke in detail about the  risk, consequences and benefits of revision surgery and informed consent  was obtained.   PROCEDURE IN DETAIL:  The patient was brought to the operating range of  motion, placed in a supine position.  He was correctly identified by his  wristband and an appropriate time-out was taken.  IV antibiotics were  given and general anesthesia was delivered.  Once adequately  anesthetized, his scrotum was clipped, prepped and draped sterilely.  We  began our procedure by performing a close exam under anesthesia.  He did  have a midline scrotal raphe scar which was healed well.  He had a large  left-sided hydrocele that was tense.  There was no evidence of  infection.  We made a transverse incision in the left hemiscrotum along  the skin lines with the scalpel.  We then took this down to the level of  the tunica vaginalis with a combination of sharp dissection using  Metzenbaum scissors and Bovie electrocautery.  We did make a small  defect in the hydrocele sac and a small amount of hydrocele fluid that  was brackish in color drained from this. We controlled the drainage by  placing a hemostat across the defect.  We then continued our dissection  circumferentially free the hydrocele sac from the surrounding dartos  tissues.  Some areas were stuck more than others especially medially  along the site of the incision posteriorly as well as inferiorly. In  areas where there was adhesion of the dartos tissue to the hydrocele  sac, we used Bovie electrocautery very judiciously to separate the  adhesions.  Eventually we were able to circumferentially free the  hydrocele sac all the while keeping the spermatic cord testicle  epididymitis protected.  We then enlarged our incision into the  hydrocele sac and subsequently drained it.  We then were able to deliver  the entire left hemiscrotal contents out of the left hemiscrotum and  once again performed  close inspection. There was not a significant  amount of clot adherent to the visceral tunica vaginalis.  Other  structures appeared to be intact.  The cord was uninjured.  We then  elected to perform a resection of the redundant tunica vaginalis.  We  did this with the Bovie electrocautery keeping approximately one-eighth  inch of the margin from the structures.  The specimens were passed off  of the table and will be sent to pathology for analysis.  Again  inspection failed to show any injury to his spermatic cord or testicle  or epididymis.  We then cauterized the edges of our tunical resection to  ensure that no bleeding would occur. We then everted the scrotum through  our incision and addressed all bleeding sites in the dartos fascia with  Bovie electrocautery.  Once we were hemostatic, we then replaced the  left testicle into the hemiscrotum  and guaranteed its proper lie and  position.  We then made an incision in the most dependent portion of the  left hemiscrotum with the Bovie electrocautery and placed a quarter inch  Penrose drain through this and sutured it into place with 4-0 Ethilon.  We then turned our attention to closure of our scrotal incision.  We  placed two Allis clamps at the apices of our incision.  We infiltrated  both edges of the incision with 0.25% plain Marcaine and then closed the  incision in two layers with 3-0 chromic.  The deep layer was a running  suture.  The superficial layer was multiple vertical mattress sutures.  There was no active bleeding. There was no hematoma formation. The wound  came together quite nicely and this marked the end of our procedure.  He  was cleaned and dried and a fluff dressing and scrotal support was  placed. Anesthesia was reversed. He awoke and was taken to the recovery  room in stable condition.  There were no complications.  Dr. Annabell Howells was  present and participated in all aspects of the case.     ______________________________  Maudie Flakes, Resident      Excell Seltzer. Annabell Howells, M.D.  Electronically Signed    DW/MEDQ  D:  03/17/2008  T:  03/17/2008  Job:  [id number]

## 2011-04-02 NOTE — Op Note (Signed)
Jose Patrick, Jose Patrick              ACCOUNT NO.:  [id number]   MEDICAL RECORD NO.:  [id number]          PATIENT TYPE:  AMB   LOCATION:  N[facility]C                         FACILITY:  [facility]   PHYSICIAN:  Excell Seltzer. Annabell Howells, M.D.    DATE OF BIRTH:  07-15-1951   DATE OF PROCEDURE:  12/17/2007  DATE OF DISCHARGE:                               OPERATIVE REPORT   PROCEDURE:  Bilateral hydrocelectomy.   PREOPERATIVE DIAGNOSIS:  Bilateral hydroceles.   POSTOPERATIVE DIAGNOSIS:  Bilateral hydroceles.   SURGEON:  Dr. Bjorn Pippin.   ANESTHESIA:  General.   SPECIMEN:  None.   COMPLICATIONS:  None.   INDICATIONS:  [name] is a 60 year old black male with bilateral  symptomatic hydroceles who has elected to undergo hydrocelectomy.   FINDINGS:  The procedure.  The patient was taken to the operating room  where general anesthetic was induced.  He was placed in supine position.  His scrotum was clipped.  He was prepped with Betadine solution.  He was  draped in the usual sterile fashion.  A midline anterior scrotal  incision was made.  The left testicle and the tunica vaginalis was  delivered from the wound using the Bovie and blunt dissection.  The  hydrocele sac was opened, drained and the redundant sac was resected.  The sac was imbricated posterior to the testicle in a water bottle  fashion with a running locked 3-0 chromic suture.  The cord was  infiltrated with approximately 3 mL of 25% Marcaine without epinephrine.  Once hemostasis was assured, the testicle was returned. The right  testicle was then delivered from the wound in similar fashion.  The  hydrocele sac was opened, drained.  The redundant sac was resected.  The  sac was imbricated behind the testicle in water bottle fashion with a  running locked 3-0 chromic suture.  Once hemostasis was assured,  the  cord was blocked with 3 mL of 25% Marcaine.  On both testicles, small  epididymal and testicular appendages were removed.  Once the  testicles  were back in the scrotum and hemostasis was assured, the dartos was  closed using a running 3-0 chromic with care taken to incorporate the  midline septum.  The skin was  then closed using running vertical mattress 3-0 chromic.  The wound had  been infiltrated with 3 mL of 25% Marcaine prior to the final closure.  The dressing was applied with 4x4s, fluff Kerlix and scrotal support.  His anesthetic was reversed.  He was admitted to the recovery room in  stable condition.  There were no complications.      Excell Seltzer. Annabell Howells, M.D.  Electronically Signed     JJW/MEDQ  D:  12/17/2007  T:  12/17/2007  Job:  [id number]   cc:   Corwin Levins, MD  [address]  [place]  [place] [address]

## 2011-04-04 ENCOUNTER — Ambulatory Visit (INDEPENDENT_AMBULATORY_CARE_PROVIDER_SITE_OTHER): Payer: Federal, State, Local not specified - PPO | Admitting: Internal Medicine

## 2011-04-04 ENCOUNTER — Encounter: Payer: Self-pay | Admitting: Internal Medicine

## 2011-04-04 VITALS — BP 118/64 | HR 68 | Temp 99.3°F | Ht 68.0 in | Wt 210.2 lb

## 2011-04-04 DIAGNOSIS — E119 Type 2 diabetes mellitus without complications: Secondary | ICD-10-CM

## 2011-04-04 DIAGNOSIS — I10 Essential (primary) hypertension: Secondary | ICD-10-CM

## 2011-04-04 DIAGNOSIS — Z0000 Encounter for general adult medical examination without abnormal findings: Secondary | ICD-10-CM

## 2011-04-04 DIAGNOSIS — F329 Major depressive disorder, single episode, unspecified: Secondary | ICD-10-CM

## 2011-04-04 DIAGNOSIS — E785 Hyperlipidemia, unspecified: Secondary | ICD-10-CM

## 2011-04-04 MED ORDER — SITAGLIPTIN PHOS-METFORMIN HCL 50-500 MG PO TABS: 1.0000 | ORAL_TABLET | Freq: Every day | ORAL | Status: DC

## 2011-04-04 MED ORDER — SIMVASTATIN 20 MG PO TABS: 20.0000 mg | ORAL_TABLET | Freq: Every evening | ORAL | Status: DC

## 2011-04-04 MED ORDER — SITAGLIPTIN PHOS-METFORMIN HCL 50-500 MG PO TABS: 1.0000 | ORAL_TABLET | Freq: Two times a day (BID) | ORAL | Status: DC

## 2011-04-04 NOTE — Assessment & Plan Note (Signed)
?   overcontrolled on the 100 mg cozaar, has lost some wt and diet has changed with emotional illness, currently BP adequate - will cont off med for now

## 2011-04-04 NOTE — Patient Instructions (Signed)
Start the zocor (simvastatin) at 20 mg per day Continue all other medications as before Please take the Aspirin 81 mg - 1 per day - COATED only Please keep your appointments with your specialists as you have planned Please return in 6 mo with Lab testing done 3-5 days before

## 2011-04-04 NOTE — Progress Notes (Signed)
Subjective:    Patient ID: Jose Moron., male    DOB: July 14, 1951, 60 y.o.   MRN: [id number]  HPI  Here to f/u; overall doing ok,  Pt denies chest pain, increased sob or doe, wheezing, orthopnea, PND, increased LE swelling, palpitations, dizziness or syncope.  Pt denies new neurological symptoms such as new headache, or facial or extremity weakness or numbness   Pt denies polydipsia, polyuria, or low sugar symptoms such as weakness or confusion improved with po intake.  Pt states overall good compliance with meds, trying to follow lower cholesterol, diabetic diet, wt overall down 5 lbs (I suspect due to increased emotional issues last 6 mo)  and little exercise.  Has ongoing marked anxiety and depression, still gets counseling and psychiatry f/u and gets meds at the [place] as well.  No further suicide attempts.   Not taking the zoloft per psychiatry b/c caused fatigue/sleepiness, and was recommended for ECT treatment at the [facility] which has not occurred.   Is willing to take statin if needed. Not taking the cozaar due to cost and fatigue. No past medical history on file. Past Surgical History  Procedure Date  . Prepatellar bursa excision     removal right  . Tonsillectomy   . Cyst off right buttock   . S/p bilat hydrocelectomy 2008    x 2    reports that he has been smoking.  He does not have any smokeless tobacco history on file. He reports that he drinks alcohol. He reports that he does not use illicit drugs. family history includes Allergies in his sister; Anxiety disorder in his mother; Cancer in his mother; Depression in his mother; and Ulcers in his mother. Allergies  Allergen Reactions  . Chlorzoxazone    Current Outpatient Prescriptions on File Prior to Visit  Medication Sig Dispense Refill  . DISCONTD: sitaGLIPtan-metformin (JANUMET) 50-500 MG per tablet Take 1 tablet by mouth 2 (two) times daily.        Marland Kitchen aspirin (ASPIRIN EC) 81 MG EC tablet Take 81 mg by mouth daily.        Marland Kitchen  losartan (COZAAR) 100 MG tablet Take 100 mg by mouth daily.        Marland Kitchen DISCONTD: acetaminophen (TYLENOL) 500 MG tablet Take 500 mg by mouth. Use as directed per OTC bottle       . DISCONTD: Carboxymethylcellul-Glycerin (OPTIVE) 0.5-0.9 % SOLN Apply to eye. Use as directed per Dr. Emily Filbert       . DISCONTD: HYDROcodone-acetaminophen (LORTAB) 10-500 MG per tablet Take 1 tablet by mouth. 1 by mouth every 5 hours as needed       . DISCONTD: sertraline (ZOLOFT) 100 MG tablet Take 100 mg by mouth. 1/2 by mouth every day for 6 days then 1 by mouth daily       . DISCONTD: simvastatin (ZOCOR) 40 MG tablet Take 40 mg by mouth at bedtime.         Review of Systems All otherwise neg per pt     Objective:   Physical Exam BP 118/64  Pulse 68  Temp(Src) 99.3 F (37.4 C) (Oral)  Ht 5\' 8"  (1.727 m)  Wt 210 lb 4 oz (95.369 kg)  BMI 31.97 kg/m2  SpO2 97% Physical Exam  VS noted Constitutional: Pt appears well-developed and well-nourished.  HENT: Head: Normocephalic.  Right Ear: External ear normal.  Left Ear: External ear normal.  Eyes: Conjunctivae and EOM are normal. Pupils are equal, round, and reactive to light.  Neck: Normal range of motion. Neck supple.  Cardiovascular: Normal rate and regular rhythm.   Pulmonary/Chest: Effort normal and breath sounds normal.  Abd:  Soft, NT, non-distended, + BS Neurological: Pt is alert. No cranial nerve deficit.  Skin: Skin is warm. No erythema.  Psychiatric:  Thought content normal. but marked anxiety and somwhat agitated.        Assessment & Plan:

## 2011-04-04 NOTE — Assessment & Plan Note (Signed)
stable overall by hx and exam, most recent lab reviewed with pt, and pt to continue medical treatment as before  Lab Results  Component Value Date   HGBA1C 6.6* 04/02/2011

## 2011-04-04 NOTE — Assessment & Plan Note (Signed)
[facility], pt agrees

## 2011-04-04 NOTE — Assessment & Plan Note (Signed)
Mild elev - to start the zocor at 20 mg per day, f/u labs next visit

## 2011-04-07 ENCOUNTER — Encounter: Payer: Self-pay | Admitting: Internal Medicine

## 2011-07-16 ENCOUNTER — Ambulatory Visit (INDEPENDENT_AMBULATORY_CARE_PROVIDER_SITE_OTHER)
Admission: RE | Admit: 2011-07-16 | Discharge: 2011-07-16 | Disposition: A | Discharge status: Home / Self Care | Payer: Federal, State, Local not specified - PPO | Source: Ambulatory Visit | Attending: Internal Medicine | Admitting: Internal Medicine

## 2011-07-16 ENCOUNTER — Ambulatory Visit: Payer: Federal, State, Local not specified - PPO

## 2011-07-16 ENCOUNTER — Ambulatory Visit (INDEPENDENT_AMBULATORY_CARE_PROVIDER_SITE_OTHER): Payer: Federal, State, Local not specified - PPO | Admitting: Internal Medicine

## 2011-07-16 ENCOUNTER — Encounter: Payer: Self-pay | Admitting: Internal Medicine

## 2011-07-16 VITALS — BP 132/70 | HR 78 | Temp 98.4°F | Ht 68.0 in | Wt 205.0 lb

## 2011-07-16 DIAGNOSIS — E785 Hyperlipidemia, unspecified: Secondary | ICD-10-CM

## 2011-07-16 DIAGNOSIS — E119 Type 2 diabetes mellitus without complications: Secondary | ICD-10-CM

## 2011-07-16 DIAGNOSIS — J209 Acute bronchitis, unspecified: Secondary | ICD-10-CM

## 2011-07-16 DIAGNOSIS — I10 Essential (primary) hypertension: Secondary | ICD-10-CM

## 2011-07-16 MED ORDER — AZITHROMYCIN 250 MG PO TABS: ORAL_TABLET | ORAL | Status: AC

## 2011-07-16 NOTE — Assessment & Plan Note (Signed)
Mild to mod, for antibx course,  to f/u any worsening symptoms or concerns, pt requests cxr as well - will do - r/o pna

## 2011-07-16 NOTE — Patient Instructions (Addendum)
Take all new medications as prescribed Continue all other medications as before Please go to XRAY in the Basement for the x-ray test Please call the phone number [phone] (the [employer] System) for results of testing in 2-3 days;  When calling, simply dial the number, and when prompted enter the MRN number above (the Medical Record Number) and the # key, then the message should start.  

## 2011-07-16 NOTE — Assessment & Plan Note (Signed)
stable overall by hx and exam, most recent data reviewed with pt, and pt to continue medical treatment as before  . BP Readings from Last 3 Encounters:  07/16/11 132/70  04/04/11 118/64  09/28/10 130/68

## 2011-07-16 NOTE — Assessment & Plan Note (Signed)
stable overall by hx and exam, most recent data reviewed with pt, and pt to continue medical treatment as before  Lab Results  Component Value Date   LDLCALC 114* 04/02/2011

## 2011-07-16 NOTE — Assessment & Plan Note (Signed)
stable overall by hx and exam, most recent data reviewed with pt, and pt to continue medical treatment as before  Lab Results  Component Value Date   HGBA1C 6.6* 04/02/2011

## 2011-07-16 NOTE — Progress Notes (Signed)
Subjective:    Patient ID: Jose Moron., male    DOB: 1951/11/17, 60 y.o.   MRN: [id number]  HPI  Here to f/u; overall doing ok,  Pt denies chest pain, increased sob or doe, wheezing, orthopnea, PND, increased LE swelling, palpitations, dizziness or syncope.  Pt denies new neurological symptoms such as new headache, or facial or extremity weakness or numbness   Pt denies polydipsia, polyuria, or low sugar symptoms such as weakness or confusion improved with po intake.  Pt states overall good compliance with meds, trying to follow lower cholesterol, diabetic diet, wt overall stable but little exercise however. Walks with cane.  States he needs to pursue more chronic care at the [place] since he has severe financial constraints and eeven though he has insurance, he has much difficulty with the copays.  Still with signficant anxiety and depressive symtpoms, some agitated today, still wants to f/u with [facility] for counseling; Dr Donell Beers "has put me on hold" as far as medications, and has ECT planned for sept 17 Chronic pain no change to lower back and Pt continues to have recurring LBP without change in severity, bowel or bladder change, fever, wt loss,  worsening LE pain/numbness/weakness, gait change or falls.    Wants "all records" since being here for 39 yrs to "put it all together at the [place]"  Has already received records at our medical records.  Incidentally - Here with acute onset mild to mod 2-3 days ST, HA, general weakness and malaise, with prod cough greenish sputum, but Pt denies chest pain, increased sob or doe, wheezing, orthopnea, PND, increased LE swelling, palpitations, dizziness or syncope. No past medical history on file. Past Surgical History  Procedure Date  . Prepatellar bursa excision     removal right  . Tonsillectomy   . Cyst off right buttock   . S/p bilat hydrocelectomy 2008    x 2    reports that he has been smoking.  He does not have any smokeless tobacco history on file.  He reports that he drinks alcohol. He reports that he does not use illicit drugs. family history includes Allergies in his sister; Anxiety disorder in his mother; Cancer in his mother; Depression in his mother; and Ulcers in his mother. Allergies  Allergen Reactions  . Chlorzoxazone    Current Outpatient Prescriptions on File Prior to Visit  Medication Sig Dispense Refill  . aspirin (ASPIRIN EC) 81 MG EC tablet Take 81 mg by mouth daily.        . simvastatin (ZOCOR) 20 MG tablet Take 1 tablet (20 mg total) by mouth every evening.  90 tablet  3  . sitaGLIPtan-metformin (JANUMET) 50-500 MG per tablet Take 1 tablet by mouth daily.  90 tablet  3   Review of Systems Review of Systems  Constitutional: Negative for diaphoresis and unexpected weight change.  HENT: Negative for drooling and tinnitus.   Eyes: Negative for photophobia and visual disturbance.  Respiratory: Negative for choking and stridor.   Gastrointestinal: Negative for vomiting and blood in stool.  Genitourinary: Negative for hematuria and decreased urine volume.       Objective:   Physical Exam BP 132/70  Pulse 78  Temp(Src) 98.4 F (36.9 C) (Oral)  Ht 5\' 8"  (1.727 m)  Wt 205 lb (92.987 kg)  BMI 31.17 kg/m2  SpO2 97% Physical Exam  VS noted Constitutional: Pt appears well-developed and well-nourished.  HENT: Head: Normocephalic.  Right Ear: External ear normal.  Left Ear:  External ear normal.  Bilat tm's mild erythema.  Sinus nontender.  Pharynx mild erythema Eyes: Conjunctivae and EOM are normal. Pupils are equal, round, and reactive to light.  Neck: Normal range of motion. Neck supple.  Cardiovascular: Normal rate and regular rhythm.   Pulmonary/Chest: Effort normal and breath sounds normal.  Abd:  Soft, NT, non-distended, + BS Neurological: Pt is alert. No cranial nerve deficit.  Skin: Skin is warm. No erythema.  Psychiatric: Pt behavior is some agitated today, anxious 2+, dysphoric, tangential .          Assessment & Plan:

## 2011-08-08 LAB — POCT I-STAT 4, (NA,K, GLUC, HGB,HCT)
Operator id: 114531
Sodium: 143

## 2011-08-13 LAB — POCT I-STAT, CHEM 8
BUN: 21
Calcium, Ion: 1.31
Chloride: 108
Creatinine, Ser: 1.1
Glucose, Bld: 119 — ABNORMAL HIGH

## 2011-10-08 ENCOUNTER — Ambulatory Visit: Payer: Federal, State, Local not specified - PPO | Admitting: Internal Medicine

## 2011-12-19 ENCOUNTER — Other Ambulatory Visit: Payer: Self-pay | Admitting: *Deleted

## 2011-12-19 MED ORDER — METFORMIN HCL 500 MG PO TABS: 500.0000 mg | ORAL_TABLET | Freq: Two times a day (BID) | ORAL | Status: DC

## 2011-12-19 MED ORDER — ASPIRIN 81 MG PO CHEW: 81.0000 mg | CHEWABLE_TABLET | Freq: Every day | ORAL | Status: DC

## 2011-12-19 NOTE — Telephone Encounter (Signed)
Ok for rx to pharmacy as requested per pt

## 2011-12-19 NOTE — Telephone Encounter (Signed)
Sent prescriptions requested to pharmacy.

## 2011-12-19 NOTE — Telephone Encounter (Signed)
Pt states that he needs a new prescription for Metformin 500mg  bid and chewable Aspirin 81 mg tablet (90 day supply) to be sent to [facility] ( [facility]) Pt also wanted MD to be aware that the [place] did find tumors in his lungs and that he had to undergo an emergency biopsy and was diagnosed with lung cancer. Please advise regarding rx.

## 2011-12-25 ENCOUNTER — Telehealth: Payer: Self-pay

## 2011-12-25 NOTE — Telephone Encounter (Signed)
Male that answered the phone states that pt is not hoe and to call back in the morning.

## 2011-12-25 NOTE — Telephone Encounter (Signed)
PLEASE (nicely) remind pt that the last time I saw him as a pt for evaluation was Jul 16, 2011.  At that time, he did have CXR which did NOT show lung cancer  Please offer to send pt a copy of his CXR

## 2011-12-25 NOTE — Telephone Encounter (Signed)
Pt called stating that the result of the emergency biopsy done at [facility] [place] did come back showing lung cancer. Pt also says that he wants MD to know that he has and will continue to make complaints regarding the numerous test that [facility] ran that did not detect cancer. Pt was very upset and insisted that MD be aware of his plans to complain.

## 2011-12-26 ENCOUNTER — Other Ambulatory Visit (HOSPITAL_COMMUNITY): Payer: Self-pay | Admitting: Internal Medicine

## 2011-12-26 DIAGNOSIS — C3490 Malignant neoplasm of unspecified part of unspecified bronchus or lung: Secondary | ICD-10-CM

## 2011-12-26 NOTE — Telephone Encounter (Signed)
Pt advised per MD advisement and stated that he is simply anxious about his Dx which has possibly spread. Pt was thankful for the copy of his xray for review. I wished Mr Creeden well, copy of his xray has been mailed.

## 2012-01-01 ENCOUNTER — Encounter (HOSPITAL_COMMUNITY)
Admission: RE | Admit: 2012-01-01 | Discharge: 2012-01-01 | Disposition: A | Discharge status: Home / Self Care | Payer: Federal, State, Local not specified - PPO | Source: Ambulatory Visit | Attending: Internal Medicine | Admitting: Internal Medicine

## 2012-01-01 DIAGNOSIS — C3490 Malignant neoplasm of unspecified part of unspecified bronchus or lung: Secondary | ICD-10-CM | POA: Insufficient documentation

## 2012-01-01 DIAGNOSIS — R599 Enlarged lymph nodes, unspecified: Secondary | ICD-10-CM | POA: Insufficient documentation

## 2012-01-01 LAB — GLUCOSE, CAPILLARY: Glucose-Capillary: 141 mg/dL — ABNORMAL HIGH (ref 70–99)

## 2012-01-01 MED ORDER — FLUDEOXYGLUCOSE F - 18 (FDG) INJECTION
19.0000 | Freq: Once | INTRAVENOUS | Status: AC | PRN
  Administered 2012-01-01: 19 via INTRAVENOUS

## 2012-01-09 DIAGNOSIS — C3490 Malignant neoplasm of unspecified part of unspecified bronchus or lung: Secondary | ICD-10-CM | POA: Insufficient documentation

## 2012-09-30 ENCOUNTER — Encounter: Payer: Self-pay | Admitting: Internal Medicine

## 2012-09-30 ENCOUNTER — Ambulatory Visit (INDEPENDENT_AMBULATORY_CARE_PROVIDER_SITE_OTHER): Payer: Federal, State, Local not specified - PPO | Admitting: Internal Medicine

## 2012-09-30 ENCOUNTER — Other Ambulatory Visit (INDEPENDENT_AMBULATORY_CARE_PROVIDER_SITE_OTHER): Payer: Federal, State, Local not specified - PPO

## 2012-09-30 VITALS — BP 122/70 | HR 75 | Temp 97.7°F | Ht 68.0 in | Wt 222.1 lb

## 2012-09-30 DIAGNOSIS — Z0000 Encounter for general adult medical examination without abnormal findings: Secondary | ICD-10-CM

## 2012-09-30 DIAGNOSIS — E119 Type 2 diabetes mellitus without complications: Secondary | ICD-10-CM

## 2012-09-30 DIAGNOSIS — C3490 Malignant neoplasm of unspecified part of unspecified bronchus or lung: Secondary | ICD-10-CM | POA: Insufficient documentation

## 2012-09-30 DIAGNOSIS — F319 Bipolar disorder, unspecified: Secondary | ICD-10-CM

## 2012-09-30 DIAGNOSIS — F4310 Post-traumatic stress disorder, unspecified: Secondary | ICD-10-CM

## 2012-09-30 HISTORY — DX: Bipolar disorder, unspecified: F31.9

## 2012-09-30 HISTORY — DX: Post-traumatic stress disorder, unspecified: F43.10

## 2012-09-30 LAB — URINALYSIS, ROUTINE W REFLEX MICROSCOPIC
Bilirubin Urine: NEGATIVE
Hgb urine dipstick: NEGATIVE
Leukocytes, UA: NEGATIVE
Nitrite: NEGATIVE
Urobilinogen, UA: 0.2 (ref 0.0–1.0)

## 2012-09-30 LAB — CBC WITH DIFFERENTIAL/PLATELET
Eosinophils Absolute: 0.1 10*3/uL (ref 0.0–0.7)
Eosinophils Relative: 1.2 % (ref 0.0–5.0)
Lymphocytes Relative: 32.4 % (ref 12.0–46.0)
MCHC: 32.5 g/dL (ref 30.0–36.0)
MCV: 82.8 fl (ref 78.0–100.0)
Monocytes Absolute: 0.6 10*3/uL (ref 0.1–1.0)
Neutrophils Relative %: 59.6 % (ref 43.0–77.0)
Platelets: 251 10*3/uL (ref 150.0–400.0)
RBC: 5.82 Mil/uL — ABNORMAL HIGH (ref 4.22–5.81)
WBC: 8.7 10*3/uL (ref 4.5–10.5)

## 2012-09-30 LAB — HEPATIC FUNCTION PANEL
ALT: 25 U/L (ref 0–53)
AST: 19 U/L (ref 0–37)
Total Bilirubin: 0.6 mg/dL (ref 0.3–1.2)
Total Protein: 7 g/dL (ref 6.0–8.3)

## 2012-09-30 LAB — LIPID PANEL
Cholesterol: 142 mg/dL (ref 0–200)
LDL Cholesterol: 85 mg/dL (ref 0–99)
Triglycerides: 83 mg/dL (ref 0.0–149.0)

## 2012-09-30 LAB — BASIC METABOLIC PANEL
BUN: 13 mg/dL (ref 6–23)
CO2: 28 mEq/L (ref 19–32)
Chloride: 102 mEq/L (ref 96–112)
Creatinine, Ser: 0.9 mg/dL (ref 0.4–1.5)
Potassium: 4.7 mEq/L (ref 3.5–5.1)

## 2012-09-30 LAB — TSH: TSH: 0.65 u[IU]/mL (ref 0.35–5.50)

## 2012-09-30 NOTE — Assessment & Plan Note (Signed)

## 2012-09-30 NOTE — Patient Instructions (Signed)
You had the flu shot today Continue all other medications as before Please have the pharmacy call with any refills you may need. You are otherwise up to date with prevention measures today Please go to LAB in the [facility] for the blood and/or urine tests to be done today You will be contacted by phone if any changes need to be made immediately.  Otherwise, you will receive a letter about your results with an explanation. Please remember to sign up for My Chart at your earliest convenience, as this will be important to you in the future with finding out test results. Please keep your appointments with your specialists as you have planned Please return in 6 mo with Lab testing done 3-5 days before

## 2012-09-30 NOTE — Assessment & Plan Note (Signed)
stable overall by hx and exam, most recent data reviewed with pt, and pt to continue medical treatment as before, for a1c today 

## 2012-09-30 NOTE — Progress Notes (Signed)
Subjective:    Patient ID: Jose Patrick, male    DOB: 1951/05/25, 61 y.o.   MRN: [id number]  HPI  Here to f/u, last seen by me aug 2012 with neg cxr, still had cough so he saw pulm soon therafter with abnormal cxr at [facility] with mass found, PET scan done, so had initial chemo and subsequent surgury.  Details unclear but he is willing to sign release of information form for [place] to apply for records.  Also dx with Bipolar and PTSD , hospd for some time at [facility].  Also [facility], also now on bid metformin c/o fatigue , as well as Pt continues to have recurring LBP without change in severity, bowel or bladder change, fever, wt loss,  worsening LE pain/numbness/weakness, gait change or falls., spinal stenosis per pt gradually worsening in the past yr as well, had MRI at [place], also noted scoliosis apparently worsening as well?  Has not seen Dr Robynn Pane recently but feels he had good care for his back, too expensive to go back to Dr Lovell Sheehan.    Due for f/u for lung cancer at [facility] this Friday.  Also his chronic feet pain has been improved with orthotics from [place] as well.  Still smoke occasionally, though much less than before, tyring to quit completely.  Pt denies CP,  orthopnea, PND, worsening LE edema, palpitations, dizziness or syncope.  Pt denies neurological change such as new Headache, facial or extremity weakness.  Pt denies polydipsia, polyuria, or low sugar symptoms. Pt states overall good compliance with treatment and medications, good tolerability, and trying to follow lower cholesterol diet.  Pt denies worsening depressive symptoms, suicidal ideation or panic. No fever, wt loss, night sweats, loss of appetite, or other constitutional symptoms.  Pt states good ability with ADL's, low fall risk, home safety reviewed and adequate, no significant changes in hearing or vision, and occasionally active with exercise. Past Medical History  Diagnosis Date  . Bipolar  affective disorder 09/30/2012  . PTSD (post-traumatic stress disorder) 09/30/2012   Past Surgical History  Procedure Date  . Prepatellar bursa excision     removal right  . Tonsillectomy   . Cyst off right buttock   . S/p bilat hydrocelectomy 2008    x 2    reports that he has been smoking.  He does not have any smokeless tobacco history on file. He reports that he drinks alcohol. He reports that he does not use illicit drugs. family history includes Allergies in his sister; Anxiety disorder in his mother; Cancer in his mother; Depression in his mother; and Ulcers in his mother. Allergies  Allergen Reactions  . Chlorzoxazone    Current Outpatient Prescriptions on File Prior to Visit  Medication Sig Dispense Refill  . aspirin 81 MG chewable tablet Chew 1 tablet (81 mg total) by mouth daily.  90 tablet  3  . metFORMIN (GLUCOPHAGE) 500 MG tablet Take 1 tablet (500 mg total) by mouth 2 (two) times daily.  180 tablet  3  . sitaGLIPtan-metformin (JANUMET) 50-500 MG per tablet Take 1 tablet by mouth daily.  90 tablet  3  . simvastatin (ZOCOR) 20 MG tablet Take 1 tablet (20 mg total) by mouth every evening.  90 tablet  3   Review of Systems Review of Systems  Constitutional: Negative for diaphoresis, activity change, appetite change and unexpected weight change.  HENT: Negative for hearing loss, ear pain, facial swelling,  mouth sores and neck stiffness.   Eyes: Negative for pain, redness and visual disturbance.  Respiratory: Negative for shortness of breath and wheezing.   Cardiovascular: Negative for chest pain and palpitations.  Gastrointestinal: Negative for diarrhea, blood in stool, abdominal distention and rectal pain.  Genitourinary: Negative for hematuria, flank pain and decreased urine volume.  Musculoskeletal: Negative for myalgias and joint swelling.  Skin: Negative for color change and wound.  Neurological: Negative for syncope and numbness.  Hematological: Negative for  adenopathy.  Psychiatric/Behavioral: Negative for hallucinations, self-injury, decreased concentration and agitation.      Objective:   Physical Exam BP 122/70  Pulse 75  Temp 97.7 F (36.5 C) (Oral)  Ht 5\' 8"  (1.727 m)  Wt 222 lb 2 oz (100.755 kg)  BMI 33.77 kg/m2  SpO2 97% Physical Exam  VS noted Constitutional: Pt is oriented to person, place, and time. Appears well-developed and well-nourished./obese  HENT:  Head: Normocephalic and atraumatic.  Right Ear: External ear normal.  Left Ear: External ear normal.  Nose: Nose normal.  Mouth/Throat: Oropharynx is clear and moist.  Eyes: Conjunctivae and EOM are normal. Pupils are equal, round, and reactive to light.  Neck: Normal range of motion. Neck supple. No JVD present. No tracheal deviation present.  Cardiovascular: Normal rate, regular rhythm, normal heart sounds and intact distal pulses.   Pulmonary/Chest: Effort normal and breath sounds normal.  Abdominal: Soft. Bowel sounds are normal. There is no tenderness.  Musculoskeletal: Normal range of motion. Exhibits no edema.  Lymphadenopathy:  Has no cervical adenopathy.  Neurological: Pt is alert and oriented to person, place, and time. Pt has normal reflexes. No cranial nerve deficit.  Skin: Skin is warm and dry. No rash noted.  Psychiatric:  Mood is somewhat irritable and animated today.  Lumbar: diffuse tender Bilat hips with decreased ROM with pain on internal ROM as well    Assessment & Plan:

## 2012-09-30 NOTE — Assessment & Plan Note (Signed)
Details unclear, will ask for records to be forwarded

## 2012-10-02 ENCOUNTER — Other Ambulatory Visit: Payer: Self-pay | Admitting: Internal Medicine

## 2012-11-02 ENCOUNTER — Telehealth: Payer: Self-pay

## 2012-11-02 NOTE — Telephone Encounter (Signed)
I reviewed [place] controlled substance registry  There was a prescription for vicodin in July 2013, but no oxycodone rx per Dr Lovell Sheehan recently  Please consider OV

## 2012-11-02 NOTE — Telephone Encounter (Signed)
The patient is requesting a prescription for pain to be refilled.  He states he waspreviously prescribed Oxycodone from Dr. Lovell Sheehan.  He states he is having severe back pain please advise as he is completely out.

## 2012-11-03 ENCOUNTER — Ambulatory Visit (INDEPENDENT_AMBULATORY_CARE_PROVIDER_SITE_OTHER): Payer: Federal, State, Local not specified - PPO | Admitting: Internal Medicine

## 2012-11-03 ENCOUNTER — Encounter: Payer: Self-pay | Admitting: Internal Medicine

## 2012-11-03 ENCOUNTER — Ambulatory Visit: Payer: Federal, State, Local not specified - PPO | Admitting: Internal Medicine

## 2012-11-03 VITALS — BP 110/72 | HR 90 | Temp 97.8°F | Wt 219.0 lb

## 2012-11-03 DIAGNOSIS — Z23 Encounter for immunization: Secondary | ICD-10-CM

## 2012-11-03 DIAGNOSIS — M549 Dorsalgia, unspecified: Secondary | ICD-10-CM

## 2012-11-03 MED ORDER — OXYCODONE HCL 5 MG PO TABS: 5.0000 mg | ORAL_TABLET | ORAL | Status: DC | PRN

## 2012-11-03 NOTE — Patient Instructions (Signed)
Chronic Back Pain   When back pain lasts longer than 3 months, it is called chronic back pain.People with chronic back pain often go through certain periods that are more intense (flare-ups).   CAUSES  Chronic back pain can be caused by wear and tear (degeneration) on different structures in your back. These structures include:   The bones of your spine (vertebrae) and the joints surrounding your spinal cord and nerve roots (facets).   The strong, fibrous tissues that connect your vertebrae (ligaments).  Degeneration of these structures may result in pressure on your nerves. This can lead to constant pain.  HOME CARE INSTRUCTIONS   Avoid bending, heavy lifting, prolonged sitting, and activities which make the problem worse.   Take brief periods of rest throughout the day to reduce your pain. Lying down or standing usually is better than sitting while you are resting.   Take over-the-counter or prescription medicines only as directed by your caregiver.  SEEK IMMEDIATE MEDICAL CARE IF:    You have weakness or numbness in one of your legs or feet.   You have trouble controlling your bladder or bowels.   You have nausea, vomiting, abdominal pain, shortness of breath, or fainting.  Document Released: 12/12/2004 Document Revised: 01/27/2012 Document Reviewed: 10/19/2011  ExitCare Patient Information 2013 [employer].

## 2012-11-03 NOTE — Telephone Encounter (Signed)
Patient informed and agreed to schedule appt.

## 2012-11-03 NOTE — Progress Notes (Signed)
Subjective:    Patient ID: Jose Patrick, male    DOB: 1950/12/29, 61 y.o.   MRN: [id number]  HPI  Pt presents to the clinic today with c/o back pain. He does have a history of chronic back pain. He was prescribed Oxycodone 5 mg # 60 back in July and has just run out. He does need a refill today. He is being seen by Dr. Lovell Sheehan who has recently discovered that he also has mild scoliosis of his back. He is requesting a RX for a walker with a seat. He also needs his shingles shot today.  Review of Systems      Past Medical History  Diagnosis Date  . Bipolar affective disorder 09/30/2012  . PTSD (post-traumatic stress disorder) 09/30/2012    Current Outpatient Prescriptions  Medication Sig Dispense Refill  . aspirin 81 MG chewable tablet Chew 1 tablet (81 mg total) by mouth daily.  90 tablet  3  . Cholecalciferol (VITAMIN D) 2000 UNITS CAPS Take by mouth daily.      . Cyanocobalamin (VITAMIN B12 PO) Take by mouth daily.      . metFORMIN (GLUCOPHAGE) 500 MG tablet Take 1 tablet (500 mg total) by mouth 2 (two) times daily.  180 tablet  3  . Pyridoxine HCl (VITAMIN B6 PO) Take by mouth daily.      . simvastatin (ZOCOR) 20 MG tablet TAKE ONE TABLET BY MOUTH EVERY DAY IN THE EVENING  90 tablet  2  . sitaGLIPtan-metformin (JANUMET) 50-500 MG per tablet Take 1 tablet by mouth daily.  90 tablet  3    Allergies  Allergen Reactions  . Chlorzoxazone     Family History  Problem Relation Age of Onset  . Cancer Mother   . Ulcers Mother     Stomach  . Depression Mother   . Anxiety disorder Mother   . Allergies Sister     History   Social History  . Marital Status: Married    Spouse Name: N/A    Number of Children: 4  . Years of Education: N/A   Occupational History  . 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 Smoking status: Current Every Day Smoker  . Smokeless tobacco: Not on file   . Alcohol Use: Yes  . Drug Use: No  . Sexually Active: Not on file   Other Topics Concern  . Not on file   Social History Narrative  . No narrative on file     Constitutional: Denies fever, malaise, fatigue, headache or abrupt weight changes.  Musculoskeletal: Pt reports back pain. Pt Denies muscle pain or joint pain and swelling.  Skin: Denies redness, rashes, lesions or ulcercations.  Neurological: Denies numbness or tingling in his feet, dizziness, difficulty with memory, difficulty with speech or problems with balance and coordination.   No other specific complaints in a complete review of systems (except as listed in HPI above).  Objective:   Physical Exam  BP 110/72  Pulse 90  Temp 97.8 F (36.6 C) (Oral)  Wt 219 lb (99.338 kg)  SpO2 97% Wt Readings from Last 3 Encounters:  11/03/12 219 lb (99.338 kg)  09/30/12 222 lb 2 oz (100.755 kg)  07/16/11 205 lb (92.987 kg)    General: Appears his stated age, well developed, well nourished in NAD.Marland Kitchen  Cardiovascular: Normal rate and rhythm. S1,S2 noted.  No murmur, rubs  or gallops noted. No JVD or BLE edema. No carotid bruits noted. Pulmonary/Chest: Normal effort and positive vesicular breath sounds. No respiratory distress. No wheezes, rales or ronchi noted. . Musculoskeletal: Decreased range of motion. Moderated difficulty with gait. No signs of joint swelling.  Neurological: Alert and oriented. Cranial nerves II-XII intact. Coordination normal. +DTRs bilaterally.        Assessment & Plan:   Back pain, chronic with additional workup required:  Will give refill of oxycodone 5 mg #60 no refills He probably should be getting refills from his ortho spine doctor Shingles shot today RX for walker with seat  RTC as needed or if symptoms persist  RTC as needed or if symptoms persist

## 2012-11-05 ENCOUNTER — Telehealth: Payer: Self-pay | Admitting: *Deleted

## 2012-11-05 NOTE — Telephone Encounter (Signed)
Pt requesting callback from Dr. Raphael Gibney nurse regarding rx for motorized scooter.

## 2012-11-05 NOTE — Telephone Encounter (Signed)
The patient called to follow-up on paperwork left at his last OV for his motorized scooter and rx.  Fax number to send to is [phone].  The patient stated the paperwork needs to be completed by the end of the year as to not have copay.

## 2012-11-05 NOTE — Telephone Encounter (Signed)
To forward to regina, as I am not sure about this

## 2012-11-09 NOTE — Telephone Encounter (Signed)
Pt informed of NP's advisement. Pt has contacted company [employer] in [place], [place] [phone]) for motorized scooter information. He will follow up with them today to see if paperwork can be sent or if paperwork has been sent. Pt would like this to be completed before the end of the year, if possible.

## 2012-11-09 NOTE — Telephone Encounter (Signed)
Fara Boros, I gave Mr. Thomspon an RX for a walker with a seat. If he needs a motorized scooter, her needs to drop off the necessary forms to be filled out. Thx! Rene Kocher

## 2012-11-12 ENCOUNTER — Telehealth: Payer: Self-pay | Admitting: Internal Medicine

## 2012-11-12 NOTE — Telephone Encounter (Signed)
See series of last phone note messages 12/19-12/23 -  Will forward to NP as she has been managing this for pt per JJ (who is out of office for remainder of 2013)

## 2012-11-12 NOTE — Telephone Encounter (Signed)
Patient is calling back to check on his request for a motorized scooter, he says the medical equipment company has not heard back and he left his paperwork with Dr. Jonny Ruiz at his last appointment, wants to speak with someone about when this will be completed. He is trying to have this sent in before the beginning of the year because then his deductible increases.

## 2012-11-13 NOTE — Telephone Encounter (Signed)
[place], If Dr. Jonny Ruiz has the paperwork, then I have no idea where it is. Dr. Jonny Ruiz is out of the office until the first of the year. If he has the paperwork, then I don't think it will be completed until after he returns. Rene Kocher

## 2013-01-15 DIAGNOSIS — Z902 Acquired absence of lung [part of]: Secondary | ICD-10-CM | POA: Insufficient documentation

## 2013-02-12 ENCOUNTER — Other Ambulatory Visit: Payer: Self-pay

## 2013-02-12 DIAGNOSIS — M549 Dorsalgia, unspecified: Secondary | ICD-10-CM

## 2013-02-12 MED ORDER — OXYCODONE HCL 5 MG PO TABS: 5.0000 mg | ORAL_TABLET | Freq: Four times a day (QID) | ORAL | Status: DC | PRN

## 2013-02-12 NOTE — Telephone Encounter (Signed)
Please have Robin (Dr. Raphael Gibney nurse) call Jose Patrick at [phone].  He called regarding a prescription for his Oxycodone (5 mg, 4 times daily, as needed).  I also relayed this info to [place] in the office.

## 2013-02-12 NOTE — Telephone Encounter (Signed)
Done hardcopy to robin  

## 2013-02-12 NOTE — Telephone Encounter (Signed)
Call back number when ready for pickup [phone]

## 2013-02-12 NOTE — Telephone Encounter (Signed)
Called the patient informed to pickup hardcopy at the front desk. 

## 2013-04-13 ENCOUNTER — Other Ambulatory Visit: Payer: Self-pay

## 2013-04-13 MED ORDER — SIMVASTATIN 20 MG PO TABS: ORAL_TABLET | ORAL | Status: DC

## 2013-05-06 ENCOUNTER — Other Ambulatory Visit: Payer: Self-pay

## 2013-05-06 MED ORDER — METFORMIN HCL 500 MG PO TABS: 500.0000 mg | ORAL_TABLET | Freq: Two times a day (BID) | ORAL | Status: DC

## 2013-06-24 ENCOUNTER — Encounter: Payer: Self-pay | Admitting: Internal Medicine

## 2013-06-26 ENCOUNTER — Encounter: Payer: Self-pay | Admitting: Gastroenterology

## 2013-06-30 ENCOUNTER — Telehealth: Payer: Self-pay | Admitting: *Deleted

## 2013-06-30 NOTE — Telephone Encounter (Signed)
PT RECEIVED RECALL LETTER, STATED DID HAVE COLON DONE AT ANOTHER FACILITY AND THERE IS NO NEED TO SEND ANOTHER ONE.

## 2013-07-06 ENCOUNTER — Telehealth: Payer: Self-pay

## 2013-07-06 NOTE — Telephone Encounter (Signed)
Pharmacy requesting clarification on metformin.  Pharmacist stated the patient said MD changed his Metformin from taking 1 BID to taking 2 BID?  Advise please

## 2013-07-06 NOTE — Telephone Encounter (Signed)
Pharmacy informed and called the patient left a detailed msg. On both cell and home number to schedule appt. To followup on sugar.

## 2013-07-06 NOTE — Telephone Encounter (Signed)
No, last rx states 1 bid  Please ask pt to make ROV, as he is due for sugar f/u

## 2013-09-06 ENCOUNTER — Telehealth: Payer: Self-pay | Admitting: Internal Medicine

## 2013-09-06 ENCOUNTER — Encounter: Payer: Self-pay | Admitting: Internal Medicine

## 2013-09-06 NOTE — Telephone Encounter (Signed)
Patient is requesting a letter to be excused from [place] duty as well as a letter for the [employer] stating that he is disabled due to his multiple medical conditions, please advise

## 2013-09-06 NOTE — Telephone Encounter (Signed)
Ok for jury duty note - chronic back pain  I would have to ask why he needs a note for the [employer]; I have never heard of this before

## 2013-09-07 NOTE — Telephone Encounter (Signed)
The wildlife letter is just to verify the patients disability and establish eligibility for the patient to participate in disabled hunt opportunities, also to allow disabled persons with limited physical ability to use their mobility aides while hunting and fishing

## 2013-09-08 ENCOUNTER — Ambulatory Visit (INDEPENDENT_AMBULATORY_CARE_PROVIDER_SITE_OTHER): Payer: Non-veteran care | Admitting: Psychiatry

## 2013-09-08 ENCOUNTER — Telehealth: Payer: Self-pay | Admitting: Internal Medicine

## 2013-09-08 ENCOUNTER — Encounter (HOSPITAL_COMMUNITY): Payer: Self-pay | Admitting: Psychiatry

## 2013-09-08 ENCOUNTER — Encounter (INDEPENDENT_AMBULATORY_CARE_PROVIDER_SITE_OTHER): Payer: Self-pay

## 2013-09-08 VITALS — BP 143/89 | HR 75 | Ht 68.0 in | Wt 239.0 lb

## 2013-09-08 DIAGNOSIS — F319 Bipolar disorder, unspecified: Secondary | ICD-10-CM

## 2013-09-08 MED ORDER — DIVALPROEX SODIUM 250 MG PO DR TAB: 250.0000 mg | DELAYED_RELEASE_TABLET | Freq: Two times a day (BID) | ORAL | Status: DC
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
## 2013-09-08 NOTE — Telephone Encounter (Signed)
Both letters have been completed and given to the patient.

## 2013-09-08 NOTE — Telephone Encounter (Signed)
09/08/2013  Pt called and says that he left the wrong paperwork with Zella Ball; a court summon.  Pt is on his way up here to get it.

## 2013-09-22 ENCOUNTER — Ambulatory Visit (INDEPENDENT_AMBULATORY_CARE_PROVIDER_SITE_OTHER): Payer: Non-veteran care | Admitting: Psychiatry

## 2013-09-22 DIAGNOSIS — F4310 Post-traumatic stress disorder, unspecified: Secondary | ICD-10-CM

## 2013-09-22 DIAGNOSIS — F329 Major depressive disorder, single episode, unspecified: Secondary | ICD-10-CM

## 2013-09-23 ENCOUNTER — Encounter (HOSPITAL_COMMUNITY): Payer: Self-pay | Admitting: Psychiatry
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
## 2013-09-28 ENCOUNTER — Ambulatory Visit: Payer: Self-pay | Admitting: Internal Medicine

## 2013-10-01 ENCOUNTER — Ambulatory Visit (HOSPITAL_COMMUNITY): Payer: Self-pay | Admitting: Psychiatry

## 2013-10-08 ENCOUNTER — Ambulatory Visit (HOSPITAL_COMMUNITY): Payer: Self-pay | Admitting: Psychiatry

## 2013-10-25 DIAGNOSIS — F259 Schizoaffective disorder, unspecified: Secondary | ICD-10-CM | POA: Insufficient documentation

## 2013-12-01 ENCOUNTER — Ambulatory Visit (HOSPITAL_COMMUNITY): Payer: Self-pay | Admitting: Psychiatry

## 2013-12-18 ENCOUNTER — Other Ambulatory Visit: Payer: Self-pay | Admitting: Internal Medicine

## 2013-12-21 ENCOUNTER — Other Ambulatory Visit: Payer: Self-pay | Admitting: Internal Medicine

## 2013-12-22 ENCOUNTER — Other Ambulatory Visit: Payer: Self-pay | Admitting: Internal Medicine

## 2013-12-27 DIAGNOSIS — J988 Other specified respiratory disorders: Secondary | ICD-10-CM | POA: Insufficient documentation

## 2014-01-22 DIAGNOSIS — J449 Chronic obstructive pulmonary disease, unspecified: Secondary | ICD-10-CM | POA: Insufficient documentation

## 2014-03-25 ENCOUNTER — Telehealth (HOSPITAL_COMMUNITY): Payer: Self-pay

## 2014-03-25 NOTE — Telephone Encounter (Signed)
Called patient to inquire about Pulmonary Rehab.  Patient is interested and is going to call and verify insurance and follow up.

## 2014-04-13 ENCOUNTER — Encounter (HOSPITAL_COMMUNITY)
Admission: RE | Admit: 2014-04-13 | Discharge: 2014-04-13 | Disposition: A | Discharge status: Home / Self Care | Payer: Federal, State, Local not specified - PPO | Source: Ambulatory Visit | Attending: Internal Medicine | Admitting: Internal Medicine

## 2014-04-13 ENCOUNTER — Ambulatory Visit (HOSPITAL_COMMUNITY): Payer: Self-pay

## 2014-04-13 VITALS — BP 146/76 | HR 70

## 2014-04-13 DIAGNOSIS — C3490 Malignant neoplasm of unspecified part of unspecified bronchus or lung: Secondary | ICD-10-CM | POA: Insufficient documentation

## 2014-04-13 DIAGNOSIS — R05 Cough: Secondary | ICD-10-CM | POA: Insufficient documentation

## 2014-04-13 DIAGNOSIS — J438 Other emphysema: Secondary | ICD-10-CM

## 2014-04-13 DIAGNOSIS — R059 Cough, unspecified: Secondary | ICD-10-CM | POA: Insufficient documentation

## 2014-04-13 DIAGNOSIS — J309 Allergic rhinitis, unspecified: Secondary | ICD-10-CM | POA: Diagnosis not present

## 2014-04-13 NOTE — Progress Notes (Signed)
Jose Patrick 63 y.o. male Pulmonary Rehab Orientation Note Patient arrived today in Cardiac and [facility] Rehab for orientation to Pulmonary Rehab. He was transported from [employer] via wheel chair. He does not carry portable oxygen. Per pt, he uses oxygen never. Color good, skin warm and dry. Patient is oriented to time and place. Patient's medical history and medications reviewed. Heart rate is normal, breath sounds clear to auscultation, no wheezes, rales, or rhonchi. Grip strength equal, strong. Distal pulses 2+ posterior tibial pulses present. Patient reports he does take medications as prescribed. Patient states he follows a Diabetic. The patient has been trying to lose weight through a healthy diet and exercise program.  Patient's weight will be monitored closely. Demonstration and practice of PLB using pulse oximeter. Patient able to return demonstration satisfactorily. Safety and hand hygiene in the exercise area reviewed with patient. Patient voices understanding of the information reviewed. Department expectations discussed with patient and achievable goals were set. The patient shows enthusiasm about attending the program and we look forward to working with this nice gentleman. The patient is scheduled for a 6 min walk test on Tuesday, April 19, 2014 at 3:30pm and to begin exercise on Thursday, April 21, 2014 in the 10:30 am class [phone]

## 2014-04-19 ENCOUNTER — Encounter (HOSPITAL_COMMUNITY)
Admission: RE | Admit: 2014-04-19 | Discharge: 2014-04-19 | Disposition: A | Discharge status: Home / Self Care | Payer: Federal, State, Local not specified - PPO | Source: Ambulatory Visit | Attending: Internal Medicine | Admitting: Internal Medicine

## 2014-04-19 DIAGNOSIS — E785 Hyperlipidemia, unspecified: Secondary | ICD-10-CM | POA: Insufficient documentation

## 2014-04-19 DIAGNOSIS — E119 Type 2 diabetes mellitus without complications: Secondary | ICD-10-CM | POA: Insufficient documentation

## 2014-04-19 DIAGNOSIS — I10 Essential (primary) hypertension: Secondary | ICD-10-CM | POA: Insufficient documentation

## 2014-04-19 DIAGNOSIS — Z5189 Encounter for other specified aftercare: Secondary | ICD-10-CM | POA: Insufficient documentation

## 2014-04-19 NOTE — Progress Notes (Signed)
Ceasar completed a Six-Minute Walk Test on 04/19/14 . Jose Patrick walked 1,355 feet with 0 breaks.  The patient's lowest oxygen saturation was 95 , highest heart rate was 106bpm , and highest blood pressure was 158/76. The patient was on 0 liters of oxygen with a nasal cannula. Kalep stated that leg fatigue and his foot pain hindered his walk test.

## 2014-04-19 NOTE — Addendum Note (Signed)
Encounter addended by: Jewel Baize, RD on: 04/19/2014  2:05 PM<BR>     Documentation filed: Notes Section

## 2014-04-19 NOTE — Progress Notes (Signed)
Nutrition Note Spoke with pt. Pt reports waking up at 7 am with a CBG of 35. Pt reports eating 2.5 slices of [employer]the works[employer]er and a Health visitor at 7 pm. Pt fell asleep at 7:45 pm and states he woke up at 9 pm, CBG was 185 mg/dL, pt took his "medications and insulin." Pt did not eat a bedtime snack and states that he was unaware of the need for a snack after taking insulin. Per pt, morning hypoglycemia is new for him. Pt reports fasting CBG's have been 100-112 mg/dL since Lantus started. Fasting CBG's before Lantus were reportedly "140-145 mg/dL." Pt MD working with pt to get CBG's under better control. Last A1c reportedly 9.7 and previous A1c was 11.5. Pt states Januvia and insulin added after his last A1c. A1c re-check reportedly scheduled in 2 months. Pt CBG 85 mg/dL after 6 min walk test. Pt states he ate a bowl of oatmeal with mixed fruit in syrup. Pt encouraged to add protein (e.g. Boiled egg) and change to fruit in its own juice or light syrup. Pt educated re: DM and exercise and desired CBG ranges for exercise. Pt expressed understanding of the information reviewed.   Nutrition Diagnosis Food-and nutrition-related knowledge deficit related to lack of exposure to information as related to diagnosis of: Pulmonary disease and DM  Intervention Pt to have a bedtime snack after he takes insulin Pt wants to try to decrease Lantus to 10 units at night. Will monitor CBG's with pt and staff and notify PCP prn. Pt to add protein to pre-exercise breakfast Given pt eats breakfast around 8:30-9 am and will participate in the 10:30 am class, pt instructed to check CBG before leaving home. If CBG < 110 mg/dL, pt to drink 1/2 juice If CBG > 300 mg/dL, pt to call pulmonary rehab  Goal(s)   CBG concentrations in the normal range or as close to normal as is safely possible.   Use pre-meal and post-meal CBG's and A1c to determine whether adjustments in food/meal planning will be beneficial or if any  meds need to be combined with nutrition therapy. Monitor and Evaluate progress toward nutrition goal with team. Derek Mound, M.Ed, RD, LDN, CDE 04/19/2014 2:03 PM

## 2014-04-21 ENCOUNTER — Encounter (HOSPITAL_COMMUNITY)
Admission: RE | Admit: 2014-04-21 | Discharge: 2014-04-21 | Disposition: A | Discharge status: Home / Self Care | Payer: Federal, State, Local not specified - PPO | Source: Ambulatory Visit | Attending: Internal Medicine | Admitting: Internal Medicine

## 2014-04-21 NOTE — Progress Notes (Signed)
Today, Jose Patrick exercised at [employer]. [facility]. Service time was from 1030 to 1215.  The patient exercised for more than 31 minutes performing aerobic, strengthening, and stretching exercises. Oxygen saturation, heart rate, blood pressure, rate of perceived exertion, and shortness of breath were all monitored before, during, and after exercise. Jose Patrick presented with no problems at today's exercise session. Jose Patrick also attended an education session on exercise for the pulmonary patient.  There was no workload change during today's exercise session.  Pre-exercise vitals:   Weight kg: 108.6   Liters of O2: ra   SpO2: 96   HR: 68   BP: 118/60   CBG: 136  Exercise vitals:   Highest heartrate:  88   Lowest oxygen saturation: 96   Highest blood pressure: 144/80   Liters of 02: ra  Post-exercise vitals:   SpO2: 97   HR: 76   BP: 112/60   Liters of O2: ra   CBG: 95  Dr. Brand Males, Medical Director Dr. Dyann Kief is immediately available during today's Pulmonary Rehab session for Jose Patrick on 04/21/2014 at 1030 class time.

## 2014-04-26 ENCOUNTER — Encounter (HOSPITAL_COMMUNITY)
Admission: RE | Admit: 2014-04-26 | Discharge: 2014-04-26 | Disposition: A | Discharge status: Home / Self Care | Payer: Federal, State, Local not specified - PPO | Source: Ambulatory Visit | Attending: Internal Medicine | Admitting: Internal Medicine

## 2014-04-26 NOTE — Progress Notes (Signed)
Today, Forest exercised at [employer]. [facility]. Service time was from 1030 to 1200.  The patient exercised for more than 31 minutes performing aerobic, strengthening, and stretching exercises. Oxygen saturation, heart rate, blood pressure, rate of perceived exertion, and shortness of breath were all monitored before, during, and after exercise. Debbie presented with no problems at today's exercise session.   There was no workload change during today's exercise session.  Pre-exercise vitals:   Weight kg: 109.3   Liters of O2: ra   SpO2: 97   HR: 82   BP: 124/64   CBG: 262  Exercise vitals:   Highest heartrate:  109   Lowest oxygen saturation: 96   Highest blood pressure: 178/88   Liters of 02: ra  Post-exercise vitals:   SpO2: 97   HR: 72   BP: 114/62   Liters of O2: ra   CBG: 184  Dr. Brand Males, Medical Director Dr. Dyann Kief is immediately available during today's Pulmonary Rehab session for Janith Lima Sr on 04/26/2014 at 1030 class time.

## 2014-04-28 ENCOUNTER — Encounter (HOSPITAL_COMMUNITY)
Admission: RE | Admit: 2014-04-28 | Discharge: 2014-04-28 | Disposition: A | Discharge status: Home / Self Care | Payer: Federal, State, Local not specified - PPO | Source: Ambulatory Visit | Attending: Internal Medicine | Admitting: Internal Medicine

## 2014-04-28 NOTE — Progress Notes (Signed)
I have reviewed a Home Exercise Prescription with Jose Patrick . [name] is not currently exercising at home.  The patient was advised to walk 3 days a week for 25 minutes.  [name] and I discussed how to progress their exercise prescription.  The patient stated that their goals were to decrease weight and improve breathing.  The patient stated that they understand the exercise prescription.  We reviewed exercise guidelines, target heart rate during exercise, oxygen use, weather, home pulse oximeter, endpoints for exercise, and goals.  Patient is encouraged to come to me with any questions. I will continue to follow up with the patient to assist them with progression and safety.

## 2014-04-28 NOTE — Progress Notes (Signed)
Today, Eldridge exercised at [employer]. [facility]. Service time was from 1030 to 1230.  The patient exercised for more than 31 minutes performing aerobic, strengthening, and stretching exercises. Oxygen saturation, heart rate, blood pressure, rate of perceived exertion, and shortness of breath were all monitored before, during, and after exercise. [name] presented with no problems at today's exercise session. Patient attended advance directive class.  There was a workload change during today's exercise session.  Pre-exercise vitals:   Weight kg: 108.8   Liters of O2: RA   SpO2: 97   HR: 71   BP: 144/72   CBG: 199  Exercise vitals:   Highest heartrate:  95   Lowest oxygen saturation: 96   Highest blood pressure: 142/70   Liters of 02: RA  Post-exercise vitals:   SpO2: 97   HR: 77   BP: 124/70   Liters of O2: RA   CBG: 167 Dr. Brand Males, Medical Director Dr. Aileen Fass is immediately available during today's Pulmonary Rehab session for Jose Patrick on 04/28/2014 at 1030 class time.

## 2014-05-03 ENCOUNTER — Encounter (HOSPITAL_COMMUNITY): Payer: Federal, State, Local not specified - PPO

## 2014-05-05 ENCOUNTER — Encounter (HOSPITAL_COMMUNITY): Admission: RE | Admit: 2014-05-05 | Payer: Federal, State, Local not specified - PPO | Source: Ambulatory Visit

## 2014-05-05 ENCOUNTER — Telehealth (HOSPITAL_COMMUNITY): Payer: Self-pay | Admitting: *Deleted

## 2014-05-06 ENCOUNTER — Telehealth (HOSPITAL_COMMUNITY): Payer: Self-pay

## 2014-05-09 ENCOUNTER — Telehealth (HOSPITAL_COMMUNITY): Payer: Self-pay

## 2014-05-10 ENCOUNTER — Encounter (HOSPITAL_COMMUNITY): Payer: Federal, State, Local not specified - PPO

## 2014-05-12 ENCOUNTER — Encounter (HOSPITAL_COMMUNITY): Payer: Federal, State, Local not specified - PPO

## 2014-05-17 ENCOUNTER — Encounter (HOSPITAL_COMMUNITY): Payer: Federal, State, Local not specified - PPO

## 2014-05-19 ENCOUNTER — Encounter (HOSPITAL_COMMUNITY): Payer: Self-pay

## 2014-05-24 ENCOUNTER — Encounter (HOSPITAL_COMMUNITY): Payer: Self-pay

## 2014-05-26 ENCOUNTER — Encounter (HOSPITAL_COMMUNITY): Payer: Self-pay

## 2014-05-31 ENCOUNTER — Encounter (HOSPITAL_COMMUNITY): Payer: Self-pay

## 2014-06-02 ENCOUNTER — Encounter (HOSPITAL_COMMUNITY): Payer: Self-pay

## 2014-06-07 ENCOUNTER — Encounter (HOSPITAL_COMMUNITY): Payer: Self-pay

## 2014-06-09 ENCOUNTER — Encounter (HOSPITAL_COMMUNITY): Payer: Self-pay

## 2014-06-14 ENCOUNTER — Encounter (HOSPITAL_COMMUNITY): Payer: Self-pay

## 2014-06-16 ENCOUNTER — Encounter (HOSPITAL_COMMUNITY): Payer: Self-pay

## 2014-06-21 ENCOUNTER — Encounter (HOSPITAL_COMMUNITY): Payer: Self-pay

## 2014-06-23 ENCOUNTER — Encounter (HOSPITAL_COMMUNITY): Payer: Self-pay

## 2014-06-28 ENCOUNTER — Encounter (HOSPITAL_COMMUNITY): Payer: Self-pay

## 2014-06-30 ENCOUNTER — Encounter (HOSPITAL_COMMUNITY): Payer: Self-pay

## 2014-07-05 ENCOUNTER — Encounter (HOSPITAL_COMMUNITY): Payer: Self-pay

## 2014-07-07 ENCOUNTER — Encounter (HOSPITAL_COMMUNITY): Payer: Self-pay

## 2014-07-12 ENCOUNTER — Encounter (HOSPITAL_COMMUNITY): Payer: Self-pay

## 2014-07-14 ENCOUNTER — Encounter (HOSPITAL_COMMUNITY): Payer: Self-pay

## 2015-05-10 DIAGNOSIS — K6289 Other specified diseases of anus and rectum: Secondary | ICD-10-CM | POA: Insufficient documentation

## 2015-05-10 DIAGNOSIS — R140 Abdominal distension (gaseous): Secondary | ICD-10-CM | POA: Insufficient documentation

## 2015-05-10 DIAGNOSIS — K635 Polyp of colon: Secondary | ICD-10-CM | POA: Insufficient documentation

## 2016-04-09 ENCOUNTER — Encounter (HOSPITAL_COMMUNITY): Payer: Self-pay | Admitting: *Deleted

## 2016-04-09 ENCOUNTER — Emergency Department (HOSPITAL_COMMUNITY)
Admission: EM | Admit: 2016-04-09 | Discharge: 2016-04-09 | Disposition: A | Discharge status: Home / Self Care | Payer: Federal, State, Local not specified - PPO | Attending: Emergency Medicine | Admitting: Emergency Medicine

## 2016-04-09 DIAGNOSIS — E119 Type 2 diabetes mellitus without complications: Secondary | ICD-10-CM | POA: Diagnosis not present

## 2016-04-09 DIAGNOSIS — F319 Bipolar disorder, unspecified: Secondary | ICD-10-CM | POA: Diagnosis not present

## 2016-04-09 DIAGNOSIS — Z7984 Long term (current) use of oral hypoglycemic drugs: Secondary | ICD-10-CM | POA: Insufficient documentation

## 2016-04-09 DIAGNOSIS — Z79899 Other long term (current) drug therapy: Secondary | ICD-10-CM | POA: Insufficient documentation

## 2016-04-09 DIAGNOSIS — Z7982 Long term (current) use of aspirin: Secondary | ICD-10-CM | POA: Insufficient documentation

## 2016-04-09 DIAGNOSIS — Z87891 Personal history of nicotine dependence: Secondary | ICD-10-CM | POA: Diagnosis not present

## 2016-04-09 DIAGNOSIS — Z794 Long term (current) use of insulin: Secondary | ICD-10-CM | POA: Diagnosis not present

## 2016-04-09 DIAGNOSIS — M62838 Other muscle spasm: Secondary | ICD-10-CM | POA: Diagnosis present

## 2016-04-09 DIAGNOSIS — I10 Essential (primary) hypertension: Secondary | ICD-10-CM | POA: Diagnosis not present

## 2016-04-09 DIAGNOSIS — M79602 Pain in left arm: Secondary | ICD-10-CM | POA: Diagnosis not present

## 2016-04-09 DIAGNOSIS — E785 Hyperlipidemia, unspecified: Secondary | ICD-10-CM | POA: Diagnosis not present

## 2016-04-09 MED ORDER — PREDNISONE 10 MG PO TABS: 10.0000 mg | ORAL_TABLET | Freq: Once | ORAL | Status: DC

## 2016-04-09 MED ORDER — PREDNISONE 20 MG PO TABS
60.0000 mg | ORAL_TABLET | Freq: Once | ORAL | Status: AC
  Administered 2016-04-09: 60 mg via ORAL
  Filled 2016-04-09: qty 3

## 2016-04-09 NOTE — ED Notes (Signed)
Pt reports he was put on Tramadol 2 weeks ago for his rotator cuff pain which made his muscle spasms.  States his doctor instructed him to stop taking tramadol.  Pt states muscle spasms is still severe.  States that when he has an episode, his whole body becomes rigid and painful.

## 2016-04-09 NOTE — ED Provider Notes (Signed)
CSN: [id number]     Arrival date & time 04/09/16  1557 History   First MD Initiated Contact with Patient 04/09/16 1651     Chief Complaint  Patient presents with  . Spasms     (Consider location/radiation/quality/duration/timing/severity/associated sxs/prior Treatment) HPI   Jose Patrick is a 65 y.o. male  who presents for evaluation of pain which he describes as "spasm", in the left neck, left shoulder, left upper arm, left lower arm, and hand/fingers 1 through 4. The pain occurs intermittently and has been present for months. He has been seen and treated by his doctor with tramadol, but he feels that made the spasms worse. He is also been given Flexeril which he is taking 5 mg twice a day. He states he was tested for carpal tunnel syndrome and told that he might have it. He has a history of degenerative joint disease of the neck and is status post surgical repair of a ruptured disc. He denies fever, chills, nausea, vomiting, cough, shortness of breath or chest pain. There are no other known modifying factors.   Past Medical History  Diagnosis Date  . Bipolar affective disorder ([place]) 09/30/2012  . PTSD (post-traumatic stress disorder) 09/30/2012  . ALLERGIC RHINITIS 10/08/2007    Qualifier: Diagnosis of  By: Jenny Reichmann MD, Hunt Oris   . BENIGN PROSTATIC HYPERTROPHY 09/28/2010    Qualifier: Diagnosis of  By: Jenny Reichmann MD, Karilyn Cota, CHRONIC, HX OF 08/10/2007    Qualifier: Diagnosis of  By: Elveria Royals   . DIABETES MELLITUS, TYPE II 10/08/2007    Qualifier: Diagnosis of  By: Jenny Reichmann MD, Hunt Oris   . HYPERLIPIDEMIA 10/08/2007    Qualifier: Diagnosis of  By: Jenny Reichmann MD, Hunt Oris   . HYPERTENSION 03/20/2009    Qualifier: Diagnosis of  By: Jenny Reichmann MD, Hunt Oris   . SPINAL STENOSIS, LUMBAR 10/08/2007    Per Dr Arnoldo Morale - [facility]   Qualifier: Diagnosis of  By: Jenny Reichmann MD, [place], HX OF 10/08/2007    Qualifier: Diagnosis of  By: Jenny Reichmann MD, [place]  ERECTILE DYSFUNCTION 10/08/2007    Qualifier: Diagnosis of  By: Jenny Reichmann MD, Hunt Oris    Past Surgical History  Procedure Laterality Date  . Prepatellar bursa excision      removal right  . Tonsillectomy    . Cyst off right buttock    . S/p bilat hydrocelectomy  2008    x 2   Family History  Problem Relation Age of Onset  . Cancer Mother   . Ulcers Mother     Stomach  . Depression Mother   . Anxiety disorder Mother   . Allergies Sister    Social History  Substance Use Topics  . Smoking status: Former Research scientist (life sciences)  . Smokeless tobacco: None  . Alcohol Use: Yes    Review of Systems  All other systems reviewed and are negative.     Allergies  Chlorzoxazone  Home Medications   Prior to Admission medications   Medication Sig Start Date End Date Taking? Authorizing Provider  aspirin 81 MG chewable tablet Chew 1 tablet (81 mg total) by mouth daily. 12/19/11   Biagio Borg, MD  Cholecalciferol (VITAMIN D) 2000 UNITS CAPS Take 1 capsule by mouth 2 (two) times daily.     Historical Provider, MD  clonazePAM (KLONOPIN) 0.5 MG tablet Take 0.5 mg by mouth 2 (two) times daily. 08/06/13  Historical Provider, MD  divalproex (DEPAKOTE) 250 MG DR tablet Take 1 tablet (250 mg total) by mouth 2 (two) times daily. 09/08/13 09/08/14  Kathlee Nations, MD  gabapentin (NEURONTIN) 800 MG tablet Take 800 mg by mouth 2 (two) times daily.    Historical Provider, MD  glipiZIDE (GLUCOTROL) 10 MG tablet Take 10 mg by mouth 2 (two) times daily. 07/08/13   Historical Provider, MD  insulin glargine (LANTUS) 100 UNIT/ML injection 20 Units.    Historical Provider, MD  metFORMIN (GLUCOPHAGE) 500 MG tablet Take 1 tablet (500 mg total) by mouth 2 (two) times daily. 05/06/13   Biagio Borg, MD  naproxen (NAPRELAN) 500 MG 24 hr tablet Take 500 mg by mouth 1 day or 1 dose.    Historical Provider, MD  predniSONE (DELTASONE) 10 MG tablet Take 1 tablet (10 mg total) by mouth once. Take q day 6,5,4,3,2,1 04/09/16   Daleen Bo,  MD  Pyridoxine HCl (VITAMIN B6 PO) Take 1 tablet by mouth daily.     Historical Provider, MD  QUEtiapine (SEROQUEL) 300 MG tablet Take 300 mg by mouth at bedtime.    Historical Provider, MD  simvastatin (ZOCOR) 20 MG tablet TAKE ONE TABLET BY MOUTH EVERY DAY IN THE EVENING 04/13/13   Biagio Borg, MD  sitaGLIPtin (JANUVIA) 100 MG tablet Take 100 mg by mouth daily.    Historical Provider, MD   BP 157/72 mmHg  Pulse 87  Temp(Src) 99.3 F (37.4 C) (Oral)  Resp 16  Ht '5\' 8"'$  (1.727 m)  Wt 240 lb (108.863 kg)  BMI 36.50 kg/m2  SpO2 92% Physical Exam  Constitutional: He is oriented to person, place, and time. He appears well-developed and well-nourished.  HENT:  Head: Normocephalic and atraumatic.  Right Ear: External ear normal.  Left Ear: External ear normal.  Eyes: Conjunctivae and EOM are normal. Pupils are equal, round, and reactive to light.  Neck: Normal range of motion and phonation normal. Neck supple.  Cardiovascular: Normal rate, regular rhythm and normal heart sounds.   Pulmonary/Chest: Effort normal and breath sounds normal. He exhibits no bony tenderness.  Musculoskeletal: Normal range of motion.  Normal range of motion, arms and legs bilaterally.  Neurological: He is alert and oriented to person, place, and time. No cranial nerve deficit or sensory deficit. He exhibits normal muscle tone. Coordination normal.  Negative Tinel's and Phalen's left hand. Intact light touch sensation. Fingers of left hand.  Skin: Skin is warm, dry and intact.  Psychiatric: He has a normal mood and affect. His behavior is normal. Judgment and thought content normal.  Nursing note and vitals reviewed.   ED Course  Procedures (including critical care time)  Medications  predniSONE (DELTASONE) tablet 60 mg (60 mg Oral Given 04/09/16 1707)    Patient Vitals for the past 24 hrs:  BP Temp Temp src Pulse Resp SpO2 Height Weight  04/09/16 1639 157/72 mmHg 99.3 F (37.4 C) Oral 87 16 92 % '5\' 8"'$   (1.727 m) 240 lb (108.863 kg)    5:09 PM Reevaluation with update and discussion. After initial assessment and treatment, an updated evaluation reveals No further complaints. Findings discussed with the patient, all questions were answered. [place] Review Labs Reviewed - No data to display  Imaging Review No results found. I have personally reviewed and evaluated these images and lab results as part of my medical decision-making.   EKG Interpretation None      MDM   Final  diagnoses:  Left arm pain    Nonspecific left arm pain, differential diagnosis includes cervical radiculopathy, brachial plexopathy, and carpal tunnel syndrome. Doubt serious bacterial infection, fracture, spinal myelopathy or metabolic instability.  Nursing Notes Reviewed/ Care Coordinated Applicable Imaging Reviewed Interpretation of Laboratory Data incorporated into ED treatment  The patient appears reasonably screened and/or stabilized for discharge and I doubt any other medical condition or other [facility] requiring further screening, evaluation, or treatment in the ED at this time prior to discharge.  Plan: Home Medications- Prednisone; Home Treatments- rest, heat; return here if the recommended treatment, does not improve the symptoms; Recommended follow up- PCP prn     Daleen Bo, MD 04/09/16 1710

## 2016-04-09 NOTE — Discharge Instructions (Signed)
Was no clear cause, for this pain. It could be from your neck, shoulder or wrist. The pain that you are having is consistent with carpal tunnel syndrome. See your doctor for checkup and consideration for treatment of carpal tunnel syndrome. In the meantime, use heat on the sore areas 3 or 4 times a day for 30 minutes. Start the new medication, prednisone, in the morning.  Heat Therapy Heat therapy can help ease sore, stiff, injured, and tight muscles and joints. Heat relaxes your muscles, which may help ease your pain.  RISKS AND COMPLICATIONS If you have any of the following conditions, do not use heat therapy unless your health care provider has approved:  Poor circulation.  Healing wounds or scarred skin in the area being treated.  Diabetes, heart disease, or high blood pressure.  Not being able to feel (numbness) the area being treated.  Unusual swelling of the area being treated.  Active infections.  Blood clots.  Cancer.  Inability to communicate pain. This may include young children and people who have problems with their brain function (dementia).  Pregnancy. Heat therapy should only be used on old, pre-existing, or long-lasting (chronic) injuries. Do not use heat therapy on new injuries unless directed by your health care provider. HOW TO USE HEAT THERAPY There are several different kinds of heat therapy, including:  Moist heat pack.  Warm water bath.  Hot water bottle.  Electric heating pad.  Heated gel pack.  Heated wrap.  Electric heating pad. Use the heat therapy method suggested by your health care provider. Follow your health care provider's instructions on when and how to use heat therapy. GENERAL HEAT THERAPY RECOMMENDATIONS  Do not sleep while using heat therapy. Only use heat therapy while you are awake.  Your skin may turn pink while using heat therapy. Do not use heat therapy if your skin turns red.  Do not use heat therapy if you have new  pain.  High heat or long exposure to heat can cause burns. Be careful when using heat therapy to avoid burning your skin.  Do not use heat therapy on areas of your skin that are already irritated, such as with a rash or sunburn. SEEK MEDICAL CARE IF:  You have blisters, redness, swelling, or numbness.  You have new pain.  Your pain is worse. MAKE SURE YOU:  Understand these instructions.  Will watch your condition.  Will get help right away if you are not doing well or get worse.   This information is not intended to replace advice given to you by your health care provider. Make sure you discuss any questions you have with your health care provider.   Document Released: 01/27/2012 Document Revised: 11/25/2014 Document Reviewed: 12/28/2013 Elsevier Interactive Patient Education 2016 Elsevier Inc.  Pain Without a Known Cause WHAT IS PAIN WITHOUT A KNOWN CAUSE? Pain can occur in any part of the body and can range from mild to severe. Sometimes no cause can be found for why you are having pain. Some types of pain that can occur without a known cause include:   Headache.  Back pain.  Abdominal pain.  Neck pain. HOW IS PAIN WITHOUT A KNOWN CAUSE DIAGNOSED?  Your health care provider will try to find the cause of your pain. This may include:  Physical exam.  Medical history.  Blood tests.  Urine tests.  X-rays. If no cause is found, your health care provider may diagnose you with pain without a known cause.  IS  THERE TREATMENT FOR PAIN WITHOUT A CAUSE?  Treatment depends on the kind of pain you have. Your health care provider may prescribe medicines to help relieve your pain.  WHAT CAN I DO AT HOME FOR MY PAIN?   Take medicines only as directed by your health care provider.  Stop any activities that cause pain. During periods of severe pain, bed rest may help.  Try to reduce your stress with activities such as yoga or meditation. Talk to your health care provider  for other stress-reducing activity recommendations.  Exercise regularly, if approved by your health care provider.  Eat a healthy diet that includes fruits and vegetables. This may improve pain. Talk to your health care provider if you have any questions about your diet. WHAT IF MY PAIN DOES NOT GET BETTER?  If you have a painful condition and no reason can be found for the pain or the pain gets worse, it is important to follow up with your health care provider. It may be necessary to repeat tests and look further for a possible cause.    This information is not intended to replace advice given to you by your health care provider. Make sure you discuss any questions you have with your health care provider.   Document Released: 07/30/2001 Document Revised: 11/25/2014 Document Reviewed: 03/22/2014 Elsevier Interactive Patient Education [employer].

## 2019-08-23 DIAGNOSIS — M4722 Other spondylosis with radiculopathy, cervical region: Secondary | ICD-10-CM | POA: Insufficient documentation

## 2020-06-06 ENCOUNTER — Encounter: Payer: Self-pay | Admitting: Occupational Therapy

## 2020-06-06 ENCOUNTER — Other Ambulatory Visit: Payer: Self-pay

## 2020-06-06 ENCOUNTER — Ambulatory Visit: Payer: No Typology Code available for payment source | Attending: Psychiatry | Admitting: Occupational Therapy

## 2020-06-06 DIAGNOSIS — R208 Other disturbances of skin sensation: Secondary | ICD-10-CM

## 2020-06-06 DIAGNOSIS — M25642 Stiffness of left hand, not elsewhere classified: Secondary | ICD-10-CM

## 2020-06-06 DIAGNOSIS — M6281 Muscle weakness (generalized): Secondary | ICD-10-CM

## 2020-06-06 DIAGNOSIS — M79601 Pain in right arm: Secondary | ICD-10-CM

## 2020-06-06 DIAGNOSIS — M25641 Stiffness of right hand, not elsewhere classified: Secondary | ICD-10-CM

## 2020-06-06 DIAGNOSIS — R278 Other lack of coordination: Secondary | ICD-10-CM

## 2020-06-06 DIAGNOSIS — R2681 Unsteadiness on feet: Secondary | ICD-10-CM

## 2020-06-06 DIAGNOSIS — R209 Unspecified disturbances of skin sensation: Secondary | ICD-10-CM | POA: Insufficient documentation

## 2020-06-06 DIAGNOSIS — M79602 Pain in left arm: Secondary | ICD-10-CM | POA: Diagnosis present

## 2020-06-06 NOTE — Therapy (Signed)
[facility] [address] [place], [place], [address] Phone: [phone]   Fax:  [phone]  Occupational Therapy Evaluation  Patient Details  Name: Jose Patrick MRN: [id number] Date of Birth: 07/15/1951 No data recorded  Encounter Date: 06/06/2020   OT End of Session - 06/06/20 1712    Visit Number 1    Number of Visits 13    Authorization Type [facility] - Has MD order for tx - 2x/week for 6 weeks    Authorization - Number of Visits 13    OT Start Time 0737    OT Stop Time 1716    OT Time Calculation (min) 70 min    Activity Tolerance Patient tolerated treatment well    Behavior During Therapy [facility] for tasks assessed/performed           Past Medical History:  Diagnosis Date   ALLERGIC RHINITIS 10/08/2007   Qualifier: Diagnosis of  By: Jenny Reichmann MD, Hunt Oris    BENIGN PROSTATIC HYPERTROPHY 09/28/2010   Qualifier: Diagnosis of  By: Jenny Reichmann MD, Hunt Oris    Bipolar affective disorder ([place]) 09/30/2012   COLONIC POLYPS, HX OF 10/08/2007   Qualifier: Diagnosis of  By: Jenny Reichmann MD, James W    CONSTIPATION, CHRONIC, HX OF 08/10/2007   Qualifier: Diagnosis of  By: [place], [place], TYPE II 10/08/2007   Qualifier: Diagnosis of  By: Jenny Reichmann MD, Hunt Oris    ERECTILE DYSFUNCTION 10/08/2007   Qualifier: Diagnosis of  By: Jenny Reichmann MD, Hunt Oris    HYPERLIPIDEMIA 10/08/2007   Qualifier: Diagnosis of  By: Jenny Reichmann MD, Hunt Oris    HYPERTENSION 03/20/2009   Qualifier: Diagnosis of  By: Jenny Reichmann MD, Hunt Oris    PTSD (post-traumatic stress disorder) 09/30/2012   SPINAL STENOSIS, LUMBAR 10/08/2007   Per Dr Arnoldo Morale - [facility]   Qualifier: Diagnosis of  By: Jenny Reichmann MD, Hunt Oris      Past Surgical History:  Procedure Laterality Date   cyst off right buttock     PREPATELLAR BURSA EXCISION     removal right   s/p bilat hydrocelectomy  2008   x 2   TONSILLECTOMY      There were no vitals filed for this  visit.   Subjective Assessment - 06/06/20 1613    Subjective  I just would like my strength - I am concerned about my ability to grip.    Currently in Pain? 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    Leisure music, and watching TV      ADL   Eating/Feeding Independent    Grooming Independent    Upper Body Bathing Minimal assistance    Lower Body Bathing Moderate assistance    Upper Body Dressing Moderate assistance    Lower Body Dressing Increased time;Modified independent    Tub/Shower Transfer Moderate assistance      Written Expression   Dominant Hand  Right    Handwriting 100% legible   Patient reports handwriting looks completely different     Sensation   Light Touch Impaired by gross assessment   diminished morseo in LUE   Stereognosis Appears Intact    Hot/Cold Not tested   Reports no problem   Proprioception Impaired by gross assessment      Coordination   Fine Motor Movements are Fluid and Coordinated No    Finger Nose Finger Test Slight dysmetria Left UE - undershooting    9 Hole Peg Test Right;Left    Right 9 Hole Peg Test 25.31    Left 9 Hole Peg Test 29.59      Tone   Assessment Location Right Upper Extremity      ROM / Strength   AROM / PROM / Strength AROM;PROM;Strength      AROM   Overall AROM  Deficits;Due to pain    AROM Assessment Site Shoulder;Wrist;Forearm    Right/Left Shoulder Left    Left Shoulder Flexion 115 Degrees    Left Shoulder ABduction 55 Degrees    Right Forearm Supination 45 Degrees    Left Forearm Supination 45 Degrees    Right/Left Wrist Left     Right Wrist Extension 40 Degrees    Right Wrist Flexion 75 Degrees    Left Wrist Extension 30 Degrees    Left Wrist Flexion 60 Degrees      Right Hand PROM   R Index  MCP 0-90 70 Degrees    R Index PIP 0-100 75 Degrees    R Long  MCP 0-90 70 Degrees    R Long PIP 0-100 80 Degrees    R Ring  MCP 0-90 75 Degrees    R Ring PIP 0-100 85 Degrees    R Little  MCP 0-90 45 Degrees    R Little PIP 0-100 85 Degrees      Left Hand PROM   L Index  MCP 0-90 75 Degrees    L Index PIP 0-100 50 Degrees    L Long  MCP 0-90 75 Degrees    L Long PIP 0-100 60 Degrees    L Ring  MCP 0-90 65 Degrees    L Ring PIP 0-100 75 Degrees    L Little  MCP 0-90 75 Degrees    L Little PIP 0-100 80 Degrees      Hand Function   Right Hand Gross Grasp Impaired    Right Hand Grip (lbs) 39.0    Right Hand Lateral Pinch 16 lbs    Right Hand 3 Point Pinch 11 lbs    Left Hand Gross Grasp Impaired    Left Hand Grip (lbs) 23.8    Left Hand Lateral Pinch 11 lbs    Left 3 point pinch 7 lbs      RUE Tone   RUE Tone Mild;Hypertonic;Modified [place]      RUE Tone   Hypertonic Details bicep   could be difficulty with instruction                   OT Treatments/Exercises (OP) - 06/06/20 0001      Modalities   Modalities Fluidotherapy      RUE Fluidotherapy   Number Minutes Fluidotherapy 10 Minutes    RUE Fluidotherapy Location Hand;Wrist;Forearm    Comments digit flex/extend      LUE Fluidotherapy   Number Minutes Fluidotherapy 10 Minutes    LUE Fluidotherapy Location Hand;Wrist;Forearm  OT Education - 06/06/20 1712    Education Details Evaluation findings and potential plan of care - may benefit for PT for balance    Person(s) Educated Patient    Methods Explanation    Comprehension Verbalized understanding            OT Short Term Goals - 06/06/20 1729      OT SHORT TERM GOAL #1   Title Patient will complete a home exercise program designed to improve hand  range of motion bilaterally DUE 9/3    Baseline No current HEP    Time 4    Period Weeks    Status New    Target Date 07/21/20      OT SHORT TERM GOAL #2   Title Patient will complete a home exercise program designed to improve coordination in BUE    Baseline No current HEP    Time 4    Period Weeks    Status New      OT SHORT TERM GOAL #3   Title Patient will don loose fitting socks with modified independence    Baseline mod assist    Time 4    Period Weeks    Status New      OT SHORT TERM GOAL #4   Title Patient will don/doff a loose fitting pull over type shirt with increased time    Baseline mod assist    Time 4    Period Weeks    Status New             OT Long Term Goals - 06/06/20 1731      OT LONG TERM GOAL #1   Title Patient will complete an HEP designed to improve hand strength bilaterally due 08/05/20    Time 6    Period Weeks    Status New    Target Date 08/05/20      OT LONG TERM GOAL #2   Title Patient will complete shower transfer and shower with no more than supervision assistance    Baseline Trying to take a tub bath, mod+ assistance to exit the tub    Time 6    Period Weeks    Status New      OT LONG TERM GOAL #3   Title Patient will demonstrate a 5 lb increase in grip strength bilaterally to aide in having strength to pull up socks, open new jars, etc    Time 6    Period Weeks    Status New      OT LONG TERM GOAL #4   Title Patient will demonstrate 90% composite flexion in LUE to aide with in hand manipulation of small items.    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  Patient with remote history 2008- Lung CA/Resection, lumbar surgery, neuropathy and HTN.  Patient requires min to mod assist to complete basic self care skills, and desires greater independence.  Patient will benefit from skilled OT Intervention to improve bilateral hand functioning  and strength, and help him reaquaint and learn adaptive techniques and equipment to increase independence and participation in ADL.    OT Occupational Profile and History Detailed Assessment- Review of Records and additional review of physical, cognitive, psychosocial history related to current functional performance    Occupational performance deficits (Please refer to evaluation for details): ADL's;IADL's;Leisure    Body Structure / Function / Physical Skills ADL;Coordination;Endurance;GMC;UE functional use;Balance;Decreased knowledge of precautions;Sensation;Pain;IADL;Flexibility;Decreased knowledge of use of DME;Body mechanics;Dexterity;FMC;Proprioception;Strength;Tone;ROM;Mobility;Edema    Rehab Potential Good    Clinical Decision Making Several treatment options, min-mod task modification necessary    Comorbidities Affecting Occupational Performance: May have comorbidities impacting occupational performance    Modification or Assistance to Complete Evaluation  Min-Moderate modification of tasks or assist with assess necessary to complete eval    OT Frequency 2x / week    OT Duration 6 weeks    OT Treatment/Interventions Self-care/ADL training;Electrical Stimulation;Therapeutic exercise;Patient/family education;Splinting;Neuromuscular education;Moist Heat;Fluidtherapy;Therapist, nutritional;Therapeutic activities;Balance training;Passive range of motion;Manual Therapy;DME and/or AE instruction;Contrast Bath;Ultrasound;Cryotherapy    Plan Review HEP for shoulder range of motion from prior therapy, begin HEP hand range of motion - fluidotherapy    Consulted and Agree with Plan of Care  Patient           Patient will benefit from skilled therapeutic intervention in order to improve the following deficits and impairments:   Body Structure / Function / Physical Skills: ADL, Coordination, Endurance, GMC, UE functional use, Balance, Decreased knowledge of precautions, Sensation, Pain, IADL, Flexibility, Decreased knowledge of use of DME, Body mechanics, Dexterity, FMC, Proprioception, Strength, Tone, ROM, Mobility, Edema       Visit Diagnosis: Stiffness of right hand, not elsewhere classified - Plan: Ot plan of care cert/re-cert  Stiffness of left hand, not elsewhere classified - Plan: Ot plan of care cert/re-cert  Pain in right arm - Plan: Ot plan of care cert/re-cert  Pain in left arm - Plan: Ot plan of care cert/re-cert  Other lack of coordination - Plan: Ot plan of care cert/re-cert  Other disturbances of skin sensation - Plan: Ot plan of care cert/re-cert  Muscle weakness (generalized) - Plan: Ot plan of care cert/re-cert  Unsteadiness on feet - Plan: Ot plan of care cert/re-cert    Problem List Patient Active Problem List   Diagnosis Date Noted   Lung cancer ([place]) 09/30/2012   Bipolar affective disorder ([place]) 09/30/2012   PTSD (post-traumatic stress disorder) 09/30/2012   Preventative health care 03/31/2011   BENIGN PROSTATIC HYPERTROPHY 09/28/2010   Cough 05/10/2010   DEPRESSION, MAJOR 09/26/2009   FECAL INCONTINENCE 05/18/2009   HYPERTENSION 03/20/2009   VERTIGO, INTERMITTENT 03/28/2008   DIABETES MELLITUS, TYPE II 10/08/2007   HYPERLIPIDEMIA 10/08/2007   ERECTILE DYSFUNCTION 10/08/2007   ALLERGIC RHINITIS 10/08/2007   SPINAL STENOSIS, LUMBAR 10/08/2007   COLONIC POLYPS, HX OF 10/08/2007   ANXIETY 08/10/2007   CONSTIPATION, CHRONIC, HX OF 08/10/2007    Mariah Milling, OTR/L 06/06/2020, 5:40 PM  [facility] [address] [place], [place], [address] Phone:  [phone]   Fax:  [phone]  Name: Jose Patrick MRN: [id number] Date of Birth: [birth year]

## 2020-06-13 ENCOUNTER — Ambulatory Visit: Payer: No Typology Code available for payment source | Admitting: Occupational Therapy

## 2020-06-13 ENCOUNTER — Other Ambulatory Visit: Payer: Self-pay

## 2020-06-13 DIAGNOSIS — M79601 Pain in right arm: Secondary | ICD-10-CM

## 2020-06-13 DIAGNOSIS — M6281 Muscle weakness (generalized): Secondary | ICD-10-CM

## 2020-06-13 DIAGNOSIS — M25641 Stiffness of right hand, not elsewhere classified: Secondary | ICD-10-CM | POA: Diagnosis not present

## 2020-06-13 DIAGNOSIS — M25642 Stiffness of left hand, not elsewhere classified: Secondary | ICD-10-CM

## 2020-06-13 DIAGNOSIS — M79602 Pain in left arm: Secondary | ICD-10-CM

## 2020-06-13 NOTE — Therapy (Signed)
[facility] [address] [place], [place], [address] Phone: [phone]   Fax:  [phone]  Occupational Therapy Treatment  Patient Details  Name: Jose Patrick MRN: [id number] Date of Birth: 01/21/1951 No data recorded  Encounter Date: 06/13/2020   OT End of Session - 06/13/20 1205    Visit Number 2    Number of Visits 27    Authorization Type [facility] - Has MD order for tx - 2x/week for 6 weeks    Authorization - Number of Visits 13    OT Start Time 1105    OT Stop Time 1145    OT Time Calculation (min) 40 min    Activity Tolerance Patient tolerated treatment well    Behavior During Therapy [facility] for tasks assessed/performed           Past Medical History:  Diagnosis Date  . ALLERGIC RHINITIS 10/08/2007   Qualifier: Diagnosis of  By: Jenny Reichmann MD, Hunt Oris   . BENIGN PROSTATIC HYPERTROPHY 09/28/2010   Qualifier: Diagnosis of  By: Jenny Reichmann MD, Hunt Oris   . Bipolar affective disorder ([place]) 09/30/2012  . COLONIC POLYPS, HX OF 10/08/2007   Qualifier: Diagnosis of  By: Jenny Reichmann MD, Karilyn Cota, CHRONIC, HX OF 08/10/2007   Qualifier: Diagnosis of  By: Elveria Royals   . DIABETES MELLITUS, TYPE II 10/08/2007   Qualifier: Diagnosis of  By: Jenny Reichmann MD, [place] ERECTILE DYSFUNCTION 10/08/2007   Qualifier: Diagnosis of  By: Jenny Reichmann MD, Hunt Oris   . HYPERLIPIDEMIA 10/08/2007   Qualifier: Diagnosis of  By: Jenny Reichmann MD, Hunt Oris   . HYPERTENSION 03/20/2009   Qualifier: Diagnosis of  By: Jenny Reichmann MD, Hunt Oris   . PTSD (post-traumatic stress disorder) 09/30/2012  . SPINAL STENOSIS, LUMBAR 10/08/2007   Per Dr Arnoldo Morale - [facility]   Qualifier: Diagnosis of  By: Jenny Reichmann MD, Hunt Oris      Past Surgical History:  Procedure Laterality Date  . cyst off right buttock    . PREPATELLAR BURSA EXCISION     removal right  . s/p bilat hydrocelectomy  2008   x 2  . TONSILLECTOMY      There were no vitals filed for this  visit.   Subjective Assessment - 06/13/20 1106    Subjective  I have had rotator cuff tears to both shoulders and they gave me injections. I didn't want to do surgery    Pertinent History s/p ACDF C6-7 08/24/2019    Limitations NO lifting > 5 lbs    Currently in Pain? Yes    Pain Score --   fluctuates   Pain Location Shoulder    Pain Orientation Right;Left    Pain Descriptors / Indicators --   strain   Pain Type Chronic pain    Pain Frequency Intermittent    Aggravating Factors  with abduction ex's in painful arc of motion, Rtworse than Lt  (pt reports rotator cuff tears bilateral shoulders w/ previous injections, no surgery)    Pain Relieving Factors rest           Pt reports that he is not doing shoulder ex's from previous therapy and did not bring them in to review today. Therefore issued bilateral sh HEP today and reviewed. Pt returned demo of each. However pt did show some pain with AA/ROM in sh abduction bilaterally (Rt shoulder worse than Lt) and reported afterwards that he has had bilateral rotator  cuff tears w/ injections - no surgery. Modified ex to reduce pain but instructed to stop if pain did not subside or gets worse. Pt also issued putty HEP and reports he has red putty at home. Therapist demo ex's but will review next session and have pt demo. Pt also shown hand ex for PIP flexion both hands (especially Lt hand) and return demo.  Fluidotherapy x 10 minutes bilateral hands to decrease stiffness                     OT Education - 06/13/20 1136    Education Details Shoulder HEP, hand and putty HEP    Person(s) Educated Patient    Methods Explanation;Demonstration;Handout;Verbal cues    Comprehension Verbalized understanding;Returned demonstration;Verbal cues required            OT Short Term Goals - 06/13/20 1207      OT SHORT TERM GOAL #1   Title Patient will complete a home exercise program designed to improve hand range of motion bilaterally DUE  9/3    Baseline No current HEP    Time 4    Period Weeks    Status On-going    Target Date 07/21/20      OT SHORT TERM GOAL #2   Title Patient will complete a home exercise program designed to improve coordination in BUE    Baseline No current HEP    Time 4    Period Weeks    Status New      OT SHORT TERM GOAL #3   Title Patient will don loose fitting socks with modified independence    Baseline mod assist    Time 4    Period Weeks    Status New      OT SHORT TERM GOAL #4   Title Patient will don/doff a loose fitting pull over type shirt with increased time    Baseline mod assist    Time 4    Period Weeks    Status New             OT Long Term Goals - 06/06/20 1731      OT LONG TERM GOAL #1   Title Patient will complete an HEP designed to improve hand strength bilaterally due 08/05/20    Time 6    Period Weeks    Status New    Target Date 08/05/20      OT LONG TERM GOAL #2   Title Patient will complete shower transfer and shower with no more than supervision assistance    Baseline Trying to take a tub bath, mod+ assistance to exit the tub    Time 6    Period Weeks    Status New      OT LONG TERM GOAL #3   Title Patient will demonstrate a 5 lb increase in grip strength bilaterally to aide in having strength to pull up socks, open new jars, etc    Time 6    Period Weeks    Status New      OT LONG TERM GOAL #4   Title Patient will demonstrate 90% composite flexion in LUE to aide with in hand manipulation of small items.    Time 6    Period Weeks    Status New                 Plan - 06/13/20 1208    Clinical Impression Statement Pt progressing towards STG #1. Pt with  greater understanding of HEP for bilateral shoulders as well.    Occupational performance deficits (Please refer to evaluation for details): ADL's;IADL's;Leisure    Body Structure / Function / Physical Skills ADL;Coordination;Endurance;GMC;UE functional use;Balance;Decreased knowledge  of precautions;Sensation;Pain;IADL;Flexibility;Decreased knowledge of use of DME;Body mechanics;Dexterity;FMC;Proprioception;Strength;Tone;ROM;Mobility;Edema    Rehab Potential Good    OT Frequency 2x / week    OT Duration 6 weeks    OT Treatment/Interventions Self-care/ADL training;Electrical Stimulation;Therapeutic exercise;Patient/family education;Splinting;Neuromuscular education;Moist Heat;Fluidtherapy;Therapist, nutritional;Therapeutic activities;Balance training;Passive range of motion;Manual Therapy;DME and/or AE instruction;Contrast Bath;Ultrasound;Cryotherapy    Plan Pt to bring in his red putty to review putty HEP - will possibly need to add putty to his existing putty. Issue coordination HEP for bilateral hands    Consulted and Agree with Plan of Care Patient           Patient will benefit from skilled therapeutic intervention in order to improve the following deficits and impairments:   Body Structure / Function / Physical Skills: ADL, Coordination, Endurance, GMC, UE functional use, Balance, Decreased knowledge of precautions, Sensation, Pain, IADL, Flexibility, Decreased knowledge of use of DME, Body mechanics, Dexterity, FMC, Proprioception, Strength, Tone, ROM, Mobility, Edema       Visit Diagnosis: Stiffness of right hand, not elsewhere classified  Stiffness of left hand, not elsewhere classified  Pain in right arm  Pain in left arm  Muscle weakness (generalized)    Problem List Patient Active Problem List   Diagnosis Date Noted  . Lung cancer ([place]) 09/30/2012  . Bipolar affective disorder ([place]) 09/30/2012  . PTSD (post-traumatic stress disorder) 09/30/2012  . Preventative health care 03/31/2011  . BENIGN PROSTATIC HYPERTROPHY 09/28/2010  . Cough 05/10/2010  . DEPRESSION, MAJOR 09/26/2009  . FECAL INCONTINENCE 05/18/2009  . HYPERTENSION 03/20/2009  . VERTIGO, INTERMITTENT 03/28/2008  . DIABETES MELLITUS, TYPE II 10/08/2007  . HYPERLIPIDEMIA  10/08/2007  . ERECTILE DYSFUNCTION 10/08/2007  . ALLERGIC RHINITIS 10/08/2007  . SPINAL STENOSIS, LUMBAR 10/08/2007  . COLONIC POLYPS, HX OF 10/08/2007  . ANXIETY 08/10/2007  . CONSTIPATION, CHRONIC, HX OF 08/10/2007    Carey Bullocks, OTR/L 06/13/2020, 12:10 PM  [facility] [address] [place], [place], [address] Phone: [phone]   Fax:  [phone]  Name: Jose Patrick MRN: [id number] Date of Birth: 01-19-1951

## 2020-06-13 NOTE — Patient Instructions (Signed)
SELF ASSISTED WITH OBJECT: Shoulder Flexion - Sitting    Hold cane with both hands. Raise arms up above head keeping elbows straight. _10__ reps per set, _2__ sets per day  ROM: Abduction - Wand    Holding wand with left hand palm up, push wand out to side (slightly diagonal), leading with other hand palm down, until stretch is felt. Hold _3___ seconds. Repeat __10__ times per set.  Do __2__ sessions per day. Repeat other side. STOP IF THIS CONTINUES TO BE PAINFUL  SHOULDER: External Rotation - Supine (Cane)    Can do seated. Hold cane with both hands. Rotate arm away from body. Keep elbow next to body and upper arm glued to side. Repeat _10__ reps per set, _2__ sets per day,  Cranial Flexion: Overhead Arm Extension - Supine (Medicine Diona Foley)    Lie with knees bent, arms beyond head, holding _paper towel roll. Raise overhead keeping elbows straight Repeat __10__ times per set. Do __2__ sets per day before getting out of bed in the morning and again at night before going to sleep.   PIP Flexion (Active Blocked)    Hold large knuckles straight using other hand. Bend middle joint of _all_____ fingers Lt hand as far as possible. Hold __5-10__ seconds. Stretch further with other hand  Repeat _10___ times. Do __2__ sessions per day.  1. Grip Strengthening (Resistive Putty)   Squeeze putty using thumb and all fingers. Repeat _20___ times. Do __2__ sessions per day.   2. Roll putty into tube on table and pinch between each first two fingers and thumb x 10 reps. Do 2 sessions per day

## 2020-06-15 ENCOUNTER — Other Ambulatory Visit: Payer: Self-pay

## 2020-06-15 ENCOUNTER — Ambulatory Visit: Payer: No Typology Code available for payment source | Admitting: Occupational Therapy

## 2020-06-15 DIAGNOSIS — M6281 Muscle weakness (generalized): Secondary | ICD-10-CM

## 2020-06-15 DIAGNOSIS — R278 Other lack of coordination: Secondary | ICD-10-CM

## 2020-06-15 DIAGNOSIS — M25641 Stiffness of right hand, not elsewhere classified: Secondary | ICD-10-CM

## 2020-06-15 DIAGNOSIS — M25642 Stiffness of left hand, not elsewhere classified: Secondary | ICD-10-CM

## 2020-06-15 NOTE — Therapy (Signed)
[facility] [address] [place], [place], [address] Phone: [phone]   Fax:  [phone]  Occupational Therapy Treatment  Patient Details  Name: Jose Patrick MRN: [id number] Date of Birth: 1951-09-02 No data recorded  Encounter Date: 06/15/2020   OT End of Session - 06/15/20 1048    Visit Number 3    Number of Visits 13    Authorization Type VA - Has MD order for tx - 2x/week for 6 weeks    Authorization - Number of Visits 13    OT Start Time 1015    OT Stop Time 1055    OT Time Calculation (min) 40 min    Activity Tolerance Patient tolerated treatment well    Behavior During Therapy [facility] for tasks assessed/performed           Past Medical History:  Diagnosis Date  . ALLERGIC RHINITIS 10/08/2007   Qualifier: Diagnosis of  By: Jonny Ruiz MD, Len Blalock   . BENIGN PROSTATIC HYPERTROPHY 09/28/2010   Qualifier: Diagnosis of  By: Jonny Ruiz MD, Len Blalock   . Bipolar affective disorder ([facility]) 09/30/2012  . COLONIC POLYPS, HX OF 10/08/2007   Qualifier: Diagnosis of  By: Jonny Ruiz MD, [place] Bene, CHRONIC, HX OF 08/10/2007   Qualifier: Diagnosis of  By: Maris Berger   . DIABETES MELLITUS, TYPE II 10/08/2007   Qualifier: Diagnosis of  By: Jonny Ruiz MD, Len Blalock   . ERECTILE DYSFUNCTION 10/08/2007   Qualifier: Diagnosis of  By: Jonny Ruiz MD, Len Blalock   . HYPERLIPIDEMIA 10/08/2007   Qualifier: Diagnosis of  By: Jonny Ruiz MD, Len Blalock   . HYPERTENSION 03/20/2009   Qualifier: Diagnosis of  By: Jonny Ruiz MD, Len Blalock   . PTSD (post-traumatic stress disorder) 09/30/2012  . SPINAL STENOSIS, LUMBAR 10/08/2007   Per Dr Lovell Sheehan - [facility]   Qualifier: Diagnosis of  By: Jonny Ruiz MD, Len Blalock      Past Surgical History:  Procedure Laterality Date  . cyst off right buttock    . PREPATELLAR BURSA EXCISION     removal right  . s/p bilat hydrocelectomy  2008   x 2  . TONSILLECTOMY      There were no vitals filed for this  visit.   Subjective Assessment - 06/15/20 1019    Subjective  I've been doing my putty much more than 20 reps, that's probably why I'm sore.    Pertinent History s/p ACDF C6-7 08/24/2019    Limitations NO lifting > 5 lbs for lifetime    Currently in Pain? Yes    Pain Score 5     Pain Location Hand    Pain Orientation Right;Left    Pain Descriptors / Indicators Sore    Pain Onset --   2 days ago   Pain Frequency Intermittent    Aggravating Factors  use, exercise    Pain Relieving Factors rest           Pt brought in red putty as requested - amount was sufficient for HEP therefore did not need to add to it. However, based on Rt grip strength and pt reports of no challenge Rt hand, upgraded to green resistance for Rt grip only. Pt instructed to continue to use red resistance putty for Rt pinch ex and Lt hand grip and pinch ex.  Pt reports soreness bilateral hands but reports he had been exceeding repetition and frequency recommendations. Cautioned only to do as instructed  2x/day and spaced out. Pt agreed.   Issued coordination HEP and demo. Pt returned demo as instructed. See pt instructions for details.   UBE x 8 min, level 3 for UB strength/endurance with 3 short rest breaks required. Pt somewhat winded after UBE:  O2 = 95%, HR = 64. Pt reports tightness and tremors Lt hand with increased activity and noted this after UBE, but reports this has been going on for awhile.                      OT Education - 06/15/20 1037    Education Details Coordination HEP, Review putty HEP and issued green putty for Rt grip only    Person(s) Educated Patient    Methods Explanation;Demonstration;Handout;Verbal cues    Comprehension Verbalized understanding;Returned demonstration;Verbal cues required            OT Short Term Goals - 06/15/20 1048      OT SHORT TERM GOAL #1   Title Patient will complete a home exercise program designed to improve hand range of motion bilaterally  DUE 9/3    Baseline No current HEP    Time 4    Period Weeks    Status Achieved    Target Date 07/21/20      OT SHORT TERM GOAL #2   Title Patient will complete a home exercise program designed to improve coordination in BUE    Baseline No current HEP    Time 4    Period Weeks    Status Achieved      OT SHORT TERM GOAL #3   Title Patient will don loose fitting socks with modified independence    Baseline mod assist    Time 4    Period Weeks    Status On-going      OT SHORT TERM GOAL #4   Title Patient will don/doff a loose fitting pull over type shirt with increased time    Baseline mod assist    Time 4    Period Weeks    Status On-going             OT Long Term Goals - 06/06/20 1731      OT LONG TERM GOAL #1   Title Patient will complete an HEP designed to improve hand strength bilaterally due 08/05/20    Time 6    Period Weeks    Status New    Target Date 08/05/20      OT LONG TERM GOAL #2   Title Patient will complete shower transfer and shower with no more than supervision assistance    Baseline Trying to take a tub bath, mod+ assistance to exit the tub    Time 6    Period Weeks    Status New      OT LONG TERM GOAL #3   Title Patient will demonstrate a 5 lb increase in grip strength bilaterally to aide in having strength to pull up socks, open new jars, etc    Time 6    Period Weeks    Status New      OT LONG TERM GOAL #4   Title Patient will demonstrate 90% composite flexion in LUE to aide with in hand manipulation of small items.    Time 6    Period Weeks    Status New                 Plan - 06/15/20 1049    Clinical  Impression Statement Pt has met 2 STG's and approximating ADL goals - pt does report donning shirt and loose fitting socks today (will wait until consistent before goals met)    Occupational performance deficits (Please refer to evaluation for details): ADL's;IADL's;Leisure    Body Structure / Function / Physical Skills  ADL;Coordination;Endurance;GMC;UE functional use;Balance;Decreased knowledge of precautions;Sensation;Pain;IADL;Flexibility;Decreased knowledge of use of DME;Body mechanics;Dexterity;FMC;Proprioception;Strength;Tone;ROM;Mobility;Edema    Rehab Potential Good    OT Frequency 2x / week    OT Duration 6 weeks    OT Treatment/Interventions Self-care/ADL training;Electrical Stimulation;Therapeutic exercise;Patient/family education;Splinting;Neuromuscular education;Moist Heat;Fluidtherapy;Therapist, nutritional;Therapeutic activities;Balance training;Passive range of motion;Manual Therapy;DME and/or AE instruction;Contrast Bath;Ultrasound;Cryotherapy    Plan continue progress towards goals    Consulted and Agree with Plan of Care Patient           Patient will benefit from skilled therapeutic intervention in order to improve the following deficits and impairments:   Body Structure / Function / Physical Skills: ADL, Coordination, Endurance, GMC, UE functional use, Balance, Decreased knowledge of precautions, Sensation, Pain, IADL, Flexibility, Decreased knowledge of use of DME, Body mechanics, Dexterity, FMC, Proprioception, Strength, Tone, ROM, Mobility, Edema       Visit Diagnosis: Stiffness of right hand, not elsewhere classified  Stiffness of left hand, not elsewhere classified  Other lack of coordination  Muscle weakness (generalized)    Problem List Patient Active Problem List   Diagnosis Date Noted  . Lung cancer ([place]) 09/30/2012  . Bipolar affective disorder ([place]) 09/30/2012  . PTSD (post-traumatic stress disorder) 09/30/2012  . Preventative health care 03/31/2011  . BENIGN PROSTATIC HYPERTROPHY 09/28/2010  . Cough 05/10/2010  . DEPRESSION, MAJOR 09/26/2009  . FECAL INCONTINENCE 05/18/2009  . HYPERTENSION 03/20/2009  . VERTIGO, INTERMITTENT 03/28/2008  . DIABETES MELLITUS, TYPE II 10/08/2007  . HYPERLIPIDEMIA 10/08/2007  . ERECTILE DYSFUNCTION 10/08/2007  .  ALLERGIC RHINITIS 10/08/2007  . SPINAL STENOSIS, LUMBAR 10/08/2007  . COLONIC POLYPS, HX OF 10/08/2007  . ANXIETY 08/10/2007  . CONSTIPATION, CHRONIC, HX OF 08/10/2007    Carey Bullocks, OTR/L 06/15/2020, 10:51 AM  [facility] [address] [place], [place], [address] Phone: [phone]   Fax:  [phone]  Name: Jose Patrick MRN: [id number] Date of Birth: Dec 25, 1950

## 2020-06-15 NOTE — Patient Instructions (Signed)
  Coordination Activities  Perform the following activities for 10 minutes 1-2 times per day with both hand(s).   Rotate ball in fingertips (clockwise and counter-clockwise).  Deal cards with your thumb (Hold deck in hand and push card off top with thumb).  Rotate 1 card in hand (clockwise and counter-clockwise).  Pick up coins one at a time until you get 5 in your hand, then move coins from palm to fingertips to stack one at a time. Do 3 stacks of 5  Practice writing with Rt hand only

## 2020-07-12 ENCOUNTER — Ambulatory Visit: Payer: No Typology Code available for payment source | Attending: Psychiatry | Admitting: Occupational Therapy

## 2020-07-12 ENCOUNTER — Other Ambulatory Visit: Payer: Self-pay

## 2020-07-12 ENCOUNTER — Encounter: Payer: Self-pay | Admitting: Occupational Therapy

## 2020-07-12 DIAGNOSIS — M25642 Stiffness of left hand, not elsewhere classified: Secondary | ICD-10-CM

## 2020-07-12 DIAGNOSIS — R2681 Unsteadiness on feet: Secondary | ICD-10-CM | POA: Insufficient documentation

## 2020-07-12 DIAGNOSIS — M79602 Pain in left arm: Secondary | ICD-10-CM | POA: Diagnosis present

## 2020-07-12 DIAGNOSIS — M79601 Pain in right arm: Secondary | ICD-10-CM | POA: Diagnosis present

## 2020-07-12 DIAGNOSIS — M6281 Muscle weakness (generalized): Secondary | ICD-10-CM

## 2020-07-12 DIAGNOSIS — R209 Unspecified disturbances of skin sensation: Secondary | ICD-10-CM | POA: Insufficient documentation

## 2020-07-12 DIAGNOSIS — R278 Other lack of coordination: Secondary | ICD-10-CM

## 2020-07-12 DIAGNOSIS — M25641 Stiffness of right hand, not elsewhere classified: Secondary | ICD-10-CM | POA: Diagnosis not present

## 2020-07-12 DIAGNOSIS — R208 Other disturbances of skin sensation: Secondary | ICD-10-CM

## 2020-07-12 NOTE — Therapy (Signed)
[facility] [address] [place], [place], [address] Phone: [phone]   Fax:  [phone]  Occupational Therapy Treatment  Patient Details  Name: Jose Patrick MRN: [id number] Date of Birth: 18-Oct-1951 No data recorded  Encounter Date: 07/12/2020   OT End of Session - 07/12/20 1708    Visit Number 4    Number of Visits 23    Authorization Type [facility] - Has MD order for tx - 2x/week for 6 weeks    Authorization - Number of Visits 13    OT Start Time 5993    OT Stop Time 1700    OT Time Calculation (min) 43 min    Equipment Utilized During Treatment tub transfer bench    Activity Tolerance Patient tolerated treatment well    Behavior During Therapy [facility] for tasks assessed/performed           Past Medical History:  Diagnosis Date  . ALLERGIC RHINITIS 10/08/2007   Qualifier: Diagnosis of  By: Jenny Reichmann MD, Hunt Oris   . BENIGN PROSTATIC HYPERTROPHY 09/28/2010   Qualifier: Diagnosis of  By: Jenny Reichmann MD, Hunt Oris   . Bipolar affective disorder ([place]) 09/30/2012  . COLONIC POLYPS, HX OF 10/08/2007   Qualifier: Diagnosis of  By: Jenny Reichmann MD, Karilyn Cota, CHRONIC, HX OF 08/10/2007   Qualifier: Diagnosis of  By: Elveria Royals   . DIABETES MELLITUS, TYPE II 10/08/2007   Qualifier: Diagnosis of  By: Jenny Reichmann MD, [place] ERECTILE DYSFUNCTION 10/08/2007   Qualifier: Diagnosis of  By: Jenny Reichmann MD, Hunt Oris   . HYPERLIPIDEMIA 10/08/2007   Qualifier: Diagnosis of  By: Jenny Reichmann MD, Hunt Oris   . HYPERTENSION 03/20/2009   Qualifier: Diagnosis of  By: Jenny Reichmann MD, Hunt Oris   . PTSD (post-traumatic stress disorder) 09/30/2012  . SPINAL STENOSIS, LUMBAR 10/08/2007   Per Dr Arnoldo Morale - [facility]   Qualifier: Diagnosis of  By: Jenny Reichmann MD, Hunt Oris      Past Surgical History:  Procedure Laterality Date  . cyst off right buttock    . PREPATELLAR BURSA EXCISION     removal right  . s/p bilat hydrocelectomy  2008   x 2  .  TONSILLECTOMY      There were no vitals filed for this visit.   Subjective Assessment - 07/12/20 1624    Subjective  Patient indicates he can use his device to put on socks.  He feels it is more related to his weight.    Pertinent History s/p ACDF C6-7 08/24/2019    Limitations NO lifting > 5 lbs for lifetime    Currently in Pain? No/denies    Pain Score 0-No pain              [facility] OT Assessment - 07/12/20 0001      Hand Function   Right Hand Grip (lbs) 41.2                    OT Treatments/Exercises (OP) - 07/12/20 0001      ADLs   UB Dressing Patient reports he can now don a loose fitting shirt as long as he dresses left arm first.      LB Dressing Patient reports he can now get his socks on - more of an issue with bending over than hand gripping/pulling sock.  Patient has sock aide - which he reports he uses on occasion.  Bathing Patient has long term goal of being able to transfer into and out of shower.  Patient has not been showerinbg due to current bath bench is "worn out"  Patient encouraged to contact [facility] for new tub bench.  Simulated tub transfers with patient.  Sitting, shifting weight back then placing feet into tub. Patient able to complete after demonstrattion with verbal cueing.  Patient desaturated to 87% with effort.  Sat quietly for 2 min O2 returned to 95%.      Functional Mobility Patient reports that he fell last week. "I am supposed to use a walker, but I am hard headed."  Patient had tewo instances when his legs slightly buckled - where he was able to catch himself.  "I didn't want to bring my cane - it doesn't look cool."  Spoke again with patient about receiving Physical Therapy, and he is not interested in pursuing that at this time.      ADL Comments Patient has met all short term goals and is working toward long term goals.        Neurological Re-education Exercises   Hand Gripper with Large Beads set on level 2 - 50% right hand, and 50%  left hand.                    OT Education - 07/12/20 1708    Education Details tub transfers with tub transfer bench    Person(s) Educated Patient    Methods Explanation;Demonstration;Verbal cues    Comprehension Verbalized understanding;Returned demonstration            OT Short Term Goals - 07/12/20 1626      OT SHORT TERM GOAL #1   Title Patient will complete a home exercise program designed to improve hand range of motion bilaterally DUE 9/3    Baseline No current HEP    Time 4    Period Weeks    Status Achieved    Target Date 07/21/20      OT SHORT TERM GOAL #2   Title Patient will complete a home exercise program designed to improve coordination in BUE    Baseline No current HEP    Time 4    Period Weeks    Status Achieved      OT SHORT TERM GOAL #3   Title Patient will don loose fitting socks with modified independence    Baseline mod assist    Time 4    Period Weeks    Status Achieved      OT SHORT TERM GOAL #4   Title Patient will don/doff a loose fitting pull over type shirt with increased time    Baseline mod assist    Time 4    Period Weeks    Status Achieved             OT Long Term Goals - 06/06/20 1731      OT LONG TERM GOAL #1   Title Patient will complete an HEP designed to improve hand strength bilaterally due 08/05/20    Time 6    Period Weeks    Status New    Target Date 08/05/20      OT LONG TERM GOAL #2   Title Patient will complete shower transfer and shower with no more than supervision assistance    Baseline Trying to take a tub bath, mod+ assistance to exit the tub    Time 6    Period Weeks    Status  New      OT LONG TERM GOAL #3   Title Patient will demonstrate a 5 lb increase in grip strength bilaterally to aide in having strength to pull up socks, open new jars, etc    Time 6    Period Weeks    Status New      OT LONG TERM GOAL #4   Title Patient will demonstrate 90% composite flexion in LUE to aide with in  hand manipulation of small items.    Time 6    Period Weeks    Status New                 Plan - 07/12/20 1709    Clinical Impression Statement Patient has met short term goals.  Anticipate earlier discharge than originally expected.    OT Occupational Profile and History Detailed Assessment- Review of Records and additional review of physical, cognitive, psychosocial history related to current functional performance    Occupational performance deficits (Please refer to evaluation for details): ADL's;IADL's;Leisure    Body Structure / Function / Physical Skills ADL;Coordination;Endurance;GMC;UE functional use;Balance;Decreased knowledge of precautions;Sensation;Pain;IADL;Flexibility;Decreased knowledge of use of DME;Body mechanics;Dexterity;FMC;Proprioception;Strength;Tone;ROM;Mobility;Edema    Rehab Potential Good    Clinical Decision Making Several treatment options, min-mod task modification necessary    Comorbidities Affecting Occupational Performance: May have comorbidities impacting occupational performance    Modification or Assistance to Complete Evaluation  Min-Moderate modification of tasks or assist with assess necessary to complete eval    OT Frequency 2x / week    OT Duration 6 weeks    OT Treatment/Interventions Self-care/ADL training;Electrical Stimulation;Therapeutic exercise;Patient/family education;Splinting;Neuromuscular education;Moist Heat;Fluidtherapy;Therapist, nutritional;Therapeutic activities;Balance training;Passive range of motion;Manual Therapy;DME and/or AE instruction;Contrast Bath;Ultrasound;Cryotherapy    Plan continue progress towards LT goals    OT Home Exercise Plan Coordination, putty    Consulted and Agree with Plan of Care Patient           Patient will benefit from skilled therapeutic intervention in order to improve the following deficits and impairments:   Body Structure / Function / Physical Skills: ADL, Coordination, Endurance, GMC,  UE functional use, Balance, Decreased knowledge of precautions, Sensation, Pain, IADL, Flexibility, Decreased knowledge of use of DME, Body mechanics, Dexterity, FMC, Proprioception, Strength, Tone, ROM, Mobility, Edema       Visit Diagnosis: Stiffness of right hand, not elsewhere classified  Stiffness of left hand, not elsewhere classified  Other lack of coordination  Muscle weakness (generalized)  Pain in right arm  Pain in left arm  Other disturbances of skin sensation  Unsteadiness on feet    Problem List Patient Active Problem List   Diagnosis Date Noted  . Lung cancer ([place]) 09/30/2012  . Bipolar affective disorder ([place]) 09/30/2012  . PTSD (post-traumatic stress disorder) 09/30/2012  . Preventative health care 03/31/2011  . BENIGN PROSTATIC HYPERTROPHY 09/28/2010  . Cough 05/10/2010  . DEPRESSION, MAJOR 09/26/2009  . FECAL INCONTINENCE 05/18/2009  . HYPERTENSION 03/20/2009  . VERTIGO, INTERMITTENT 03/28/2008  . DIABETES MELLITUS, TYPE II 10/08/2007  . HYPERLIPIDEMIA 10/08/2007  . ERECTILE DYSFUNCTION 10/08/2007  . ALLERGIC RHINITIS 10/08/2007  . SPINAL STENOSIS, LUMBAR 10/08/2007  . COLONIC POLYPS, HX OF 10/08/2007  . ANXIETY 08/10/2007  . CONSTIPATION, CHRONIC, HX OF 08/10/2007    Mariah Milling, OTR/L 07/12/2020, 5:12 PM  [facility] [address] [place], [place], [address] Phone: [phone]   Fax:  [phone]  Name: Jose Patrick MRN: [id number] Date of Birth: 1951-02-08

## 2020-07-18 ENCOUNTER — Encounter: Payer: Self-pay | Admitting: Occupational Therapy

## 2020-07-18 ENCOUNTER — Other Ambulatory Visit: Payer: Self-pay

## 2020-07-18 ENCOUNTER — Ambulatory Visit: Payer: No Typology Code available for payment source | Admitting: Occupational Therapy

## 2020-07-18 DIAGNOSIS — M25642 Stiffness of left hand, not elsewhere classified: Secondary | ICD-10-CM

## 2020-07-18 DIAGNOSIS — M79602 Pain in left arm: Secondary | ICD-10-CM

## 2020-07-18 DIAGNOSIS — R278 Other lack of coordination: Secondary | ICD-10-CM

## 2020-07-18 DIAGNOSIS — M79601 Pain in right arm: Secondary | ICD-10-CM

## 2020-07-18 DIAGNOSIS — M25641 Stiffness of right hand, not elsewhere classified: Secondary | ICD-10-CM | POA: Diagnosis not present

## 2020-07-18 DIAGNOSIS — R208 Other disturbances of skin sensation: Secondary | ICD-10-CM

## 2020-07-18 DIAGNOSIS — R2681 Unsteadiness on feet: Secondary | ICD-10-CM

## 2020-07-18 DIAGNOSIS — M6281 Muscle weakness (generalized): Secondary | ICD-10-CM

## 2020-07-18 NOTE — Therapy (Signed)
[facility] [address] [place], [place], [address] Phone: [phone]   Fax:  [phone]  Occupational Therapy Treatment  Patient Details  Name: Jose Patrick MRN: [id number] Date of Birth: 17-Jan-1951 No data recorded  Encounter Date: 07/18/2020   OT End of Session - 07/18/20 1740    Visit Number 5    Number of Visits 50    Authorization Type [facility] - Has MD order for tx - 2x/week for 6 weeks    Authorization - Number of Visits 13    OT Start Time 1700    OT Stop Time 1740    OT Time Calculation (min) 40 min    Activity Tolerance Patient tolerated treatment well    Behavior During Therapy [facility] for tasks assessed/performed           Past Medical History:  Diagnosis Date  . ALLERGIC RHINITIS 10/08/2007   Qualifier: Diagnosis of  By: Jenny Reichmann MD, Hunt Oris   . BENIGN PROSTATIC HYPERTROPHY 09/28/2010   Qualifier: Diagnosis of  By: Jenny Reichmann MD, Hunt Oris   . Bipolar affective disorder ([place]) 09/30/2012  . COLONIC POLYPS, HX OF 10/08/2007   Qualifier: Diagnosis of  By: Jenny Reichmann MD, Karilyn Cota, CHRONIC, HX OF 08/10/2007   Qualifier: Diagnosis of  By: Elveria Royals   . DIABETES MELLITUS, TYPE II 10/08/2007   Qualifier: Diagnosis of  By: Jenny Reichmann MD, [place] ERECTILE DYSFUNCTION 10/08/2007   Qualifier: Diagnosis of  By: Jenny Reichmann MD, Hunt Oris   . HYPERLIPIDEMIA 10/08/2007   Qualifier: Diagnosis of  By: Jenny Reichmann MD, Hunt Oris   . HYPERTENSION 03/20/2009   Qualifier: Diagnosis of  By: Jenny Reichmann MD, Hunt Oris   . PTSD (post-traumatic stress disorder) 09/30/2012  . SPINAL STENOSIS, LUMBAR 10/08/2007   Per Dr Arnoldo Morale - [facility]   Qualifier: Diagnosis of  By: Jenny Reichmann MD, Hunt Oris      Past Surgical History:  Procedure Laterality Date  . cyst off right buttock    . PREPATELLAR BURSA EXCISION     removal right  . s/p bilat hydrocelectomy  2008   x 2  . TONSILLECTOMY      There were no vitals filed for this  visit.   Subjective Assessment - 07/18/20 1705    Subjective  I got a knot right here that hurts.  I might be squeezing that putty too much.    Pertinent History s/p ACDF C6-7 08/24/2019    Limitations NO lifting > 5 lbs for lifetime    Currently in Pain? No/denies    Pain Score 0-No pain              [facility] OT Assessment - 07/18/20 0001      Hand Function   Right Hand Grip (lbs) 47.8    Left Hand Grip (lbs) 35.7                    OT Treatments/Exercises (OP) - 07/18/20 0001      LUE Fluidotherapy   Number Minutes Fluidotherapy 10 Minutes    LUE Fluidotherapy Location Hand;Wrist;Forearm    Comments prior to stretching      Manual Therapy   Soft tissue mobilization Between radius and ulna.  Patient reporting some mild disconfort in forearm (interossei) after exercising left hand.     Passive ROM Passive composite flexion for left hand to increase grip following fluidotherapy.  Patient able to  make a full fist after heat and gentle stretch and sustain.  Encouraged patient to continue with composite fist and opening left hand to maintain range achieved today.                    OT Education - 07/18/20 1740    Education Details Patient indicates he will receive tub transfer bench and hand held shower tomorrow.    Person(s) Educated Patient    Methods Explanation    Comprehension Verbalized understanding            OT Short Term Goals - 07/18/20 1742      OT SHORT TERM GOAL #1   Title Patient will complete a home exercise program designed to improve hand range of motion bilaterally DUE 9/3    Baseline No current HEP    Time 4    Period Weeks    Status Achieved    Target Date 07/21/20      OT SHORT TERM GOAL #2   Title Patient will complete a home exercise program designed to improve coordination in BUE    Baseline No current HEP    Time 4    Period Weeks    Status Achieved      OT SHORT TERM GOAL #3   Title Patient will don loose fitting  socks with modified independence    Baseline mod assist    Time 4    Period Weeks    Status Achieved      OT SHORT TERM GOAL #4   Title Patient will don/doff a loose fitting pull over type shirt with increased time    Baseline mod assist    Time 4    Period Weeks    Status Achieved             OT Long Term Goals - 07/18/20 1742      OT LONG TERM GOAL #1   Title Patient will complete an HEP designed to improve hand strength bilaterally due 08/05/20    Status Achieved      OT LONG TERM GOAL #2   Title Patient will complete shower transfer and shower with no more than supervision assistance    Status On-going                 Plan - 07/18/20 1741    Clinical Impression Statement Patient has met some of his long term goals, anticiapte discharge next visit.  Patient in agreement.    OT Occupational Profile and History Detailed Assessment- Review of Records and additional review of physical, cognitive, psychosocial history related to current functional performance    Occupational performance deficits (Please refer to evaluation for details): ADL's;IADL's;Leisure    Body Structure / Function / Physical Skills ADL;Coordination;Endurance;GMC;UE functional use;Balance;Decreased knowledge of precautions;Sensation;Pain;IADL;Flexibility;Decreased knowledge of use of DME;Body mechanics;Dexterity;FMC;Proprioception;Strength;Tone;ROM;Mobility;Edema    Rehab Potential Good    Clinical Decision Making Several treatment options, min-mod task modification necessary    Comorbidities Affecting Occupational Performance: May have comorbidities impacting occupational performance    Modification or Assistance to Complete Evaluation  Min-Moderate modification of tasks or assist with assess necessary to complete eval    OT Frequency 2x / week    OT Duration 6 weeks    OT Treatment/Interventions Self-care/ADL training;Electrical Stimulation;Therapeutic exercise;Patient/family  education;Splinting;Neuromuscular education;Moist Heat;Fluidtherapy;Therapist, nutritional;Therapeutic activities;Balance training;Passive range of motion;Manual Therapy;DME and/or AE instruction;Contrast Bath;Ultrasound;Cryotherapy    Plan check shower transfer per report and ensure composite flexion of left hand  OT Home Exercise Plan Coordination, putty    Consulted and Agree with Plan of Care Patient           Patient will benefit from skilled therapeutic intervention in order to improve the following deficits and impairments:   Body Structure / Function / Physical Skills: ADL, Coordination, Endurance, GMC, UE functional use, Balance, Decreased knowledge of precautions, Sensation, Pain, IADL, Flexibility, Decreased knowledge of use of DME, Body mechanics, Dexterity, FMC, Proprioception, Strength, Tone, ROM, Mobility, Edema       Visit Diagnosis: Stiffness of right hand, not elsewhere classified  Stiffness of left hand, not elsewhere classified  Other lack of coordination  Muscle weakness (generalized)  Pain in right arm  Pain in left arm  Other disturbances of skin sensation  Unsteadiness on feet    Problem List Patient Active Problem List   Diagnosis Date Noted  . Lung cancer ([place]) 09/30/2012  . Bipolar affective disorder ([place]) 09/30/2012  . PTSD (post-traumatic stress disorder) 09/30/2012  . Preventative health care 03/31/2011  . BENIGN PROSTATIC HYPERTROPHY 09/28/2010  . Cough 05/10/2010  . DEPRESSION, MAJOR 09/26/2009  . FECAL INCONTINENCE 05/18/2009  . HYPERTENSION 03/20/2009  . VERTIGO, INTERMITTENT 03/28/2008  . DIABETES MELLITUS, TYPE II 10/08/2007  . HYPERLIPIDEMIA 10/08/2007  . ERECTILE DYSFUNCTION 10/08/2007  . ALLERGIC RHINITIS 10/08/2007  . SPINAL STENOSIS, LUMBAR 10/08/2007  . COLONIC POLYPS, HX OF 10/08/2007  . ANXIETY 08/10/2007  . CONSTIPATION, CHRONIC, HX OF 08/10/2007    Mariah Milling, OTR/L 07/18/2020, 5:43 PM  [facility] [address] [place], [place], [address] Phone: [phone]   Fax:  [phone]  Name: Jose Patrick MRN: [id number] Date of Birth: 28-Sep-1951

## 2020-07-20 ENCOUNTER — Encounter: Payer: Self-pay | Admitting: Occupational Therapy

## 2020-07-20 ENCOUNTER — Other Ambulatory Visit: Payer: Self-pay

## 2020-07-20 ENCOUNTER — Ambulatory Visit: Payer: No Typology Code available for payment source | Attending: Psychiatry | Admitting: Occupational Therapy

## 2020-07-20 DIAGNOSIS — M25641 Stiffness of right hand, not elsewhere classified: Secondary | ICD-10-CM | POA: Insufficient documentation

## 2020-07-20 DIAGNOSIS — M79602 Pain in left arm: Secondary | ICD-10-CM

## 2020-07-20 DIAGNOSIS — M25642 Stiffness of left hand, not elsewhere classified: Secondary | ICD-10-CM | POA: Insufficient documentation

## 2020-07-20 DIAGNOSIS — R2681 Unsteadiness on feet: Secondary | ICD-10-CM | POA: Diagnosis present

## 2020-07-20 DIAGNOSIS — R278 Other lack of coordination: Secondary | ICD-10-CM

## 2020-07-20 DIAGNOSIS — M79601 Pain in right arm: Secondary | ICD-10-CM | POA: Diagnosis present

## 2020-07-20 DIAGNOSIS — M6281 Muscle weakness (generalized): Secondary | ICD-10-CM | POA: Diagnosis present

## 2020-07-20 DIAGNOSIS — R209 Unspecified disturbances of skin sensation: Secondary | ICD-10-CM | POA: Insufficient documentation

## 2020-07-20 DIAGNOSIS — R208 Other disturbances of skin sensation: Secondary | ICD-10-CM

## 2020-07-20 NOTE — Therapy (Signed)
[facility] [address] [place], [place], [address] Phone: [phone]   Fax:  [phone]  Occupational Therapy Treatment and Discharge  Patient Details  Name: Jose Patrick MRN: [id number] Date of Birth: 28-Jan-1951 No data recorded  Encounter Date: 07/20/2020   OT End of Session - 07/20/20 1728    Visit Number 6    Number of Visits 13    Authorization Type [facility] - Has MD order for tx - 2x/week for 6 weeks    OT Start Time 1700    OT Stop Time 1727    OT Time Calculation (min) 27 min    Activity Tolerance Patient tolerated treatment well    Behavior During Therapy [facility] for tasks assessed/performed           Past Medical History:  Diagnosis Date  . ALLERGIC RHINITIS 10/08/2007   Qualifier: Diagnosis of  By: Jonny Ruiz MD, Len Blalock   . BENIGN PROSTATIC HYPERTROPHY 09/28/2010   Qualifier: Diagnosis of  By: Jonny Ruiz MD, Len Blalock   . Bipolar affective disorder (HCC) 09/30/2012  . COLONIC POLYPS, HX OF 10/08/2007   Qualifier: Diagnosis of  By: Jonny Ruiz MD, Heywood Bene, CHRONIC, HX OF 08/10/2007   Qualifier: Diagnosis of  By: Maris Berger   . DIABETES MELLITUS, TYPE II 10/08/2007   Qualifier: Diagnosis of  By: Jonny Ruiz MD, Len Blalock   . ERECTILE DYSFUNCTION 10/08/2007   Qualifier: Diagnosis of  By: Jonny Ruiz MD, Len Blalock   . HYPERLIPIDEMIA 10/08/2007   Qualifier: Diagnosis of  By: Jonny Ruiz MD, Len Blalock   . HYPERTENSION 03/20/2009   Qualifier: Diagnosis of  By: Jonny Ruiz MD, Len Blalock   . PTSD (post-traumatic stress disorder) 09/30/2012  . SPINAL STENOSIS, LUMBAR 10/08/2007   Per Dr Lovell Sheehan - [facility]   Qualifier: Diagnosis of  By: Jonny Ruiz MD, Len Blalock      Past Surgical History:  Procedure Laterality Date  . cyst off right buttock    . PREPATELLAR BURSA EXCISION     removal right  . s/p bilat hydrocelectomy  2008   x 2  . TONSILLECTOMY      There were no vitals filed for this visit.   Subjective Assessment  - 07/20/20 1703    Subjective  Thanks for working out that pain in my forearm    Pertinent History s/p ACDF C6-7 08/24/2019    Limitations NO lifting > 5 lbs for lifetime    Currently in Pain? No/denies    Pain Score 0-No pain                        OT Treatments/Exercises (OP) - 07/20/20 0001      ADLs   ADL Comments Reviewed goals and plan for early discharge.  Patient very pleased with porgress.        Neurological Re-education Exercises   Hand Gripper with Large Beads Full set ~40 blocks with left hand then right hand on 3rd resistance setting without difficulty.    Other Grasp and Release Exercises  Grooved pegboard without difficulty right and left.  Cueing to avoid over weight shift to raise right arm.  Cued patient to maintain forearm or elbow in contact with table.                     OT Short Term Goals - 07/20/20 1730      OT SHORT TERM  GOAL #1   Title Patient will complete a home exercise program designed to improve hand range of motion bilaterally DUE 9/3    Baseline No current HEP    Time 4    Period Weeks    Status Achieved    Target Date 07/21/20      OT SHORT TERM GOAL #2   Title Patient will complete a home exercise program designed to improve coordination in BUE    Baseline No current HEP    Time 4    Period Weeks    Status Achieved      OT SHORT TERM GOAL #3   Title Patient will don loose fitting socks with modified independence    Baseline mod assist    Time 4    Period Weeks    Status Achieved      OT SHORT TERM GOAL #4   Title Patient will don/doff a loose fitting pull over type shirt with increased time    Baseline mod assist    Time 4    Period Weeks    Status Achieved             OT Long Term Goals - 07/20/20 1730      OT LONG TERM GOAL #2   Title Patient will complete shower transfer and shower with no more than supervision assistance    Baseline Trying to take a tub bath, mod+ assistance to exit the tub     Period Weeks    Status Achieved      OT LONG TERM GOAL #3   Title Patient will demonstrate a 5 lb increase in grip strength bilaterally to aide in having strength to pull up socks, open new jars, etc    Time 6    Period Weeks    Status Achieved      OT LONG TERM GOAL #4   Title Patient will demonstrate 90% composite flexion in LUE to aide with in hand manipulation of small items.    Time 6    Period Weeks    Status Achieved                 Plan - 07/20/20 1729    Clinical Impression Statement Patient has met all goals and is agreeable to OT discharge    OT Occupational Profile and History Detailed Assessment- Review of Records and additional review of physical, cognitive, psychosocial history related to current functional performance    Occupational performance deficits (Please refer to evaluation for details): ADL's;IADL's;Leisure    Body Structure / Function / Physical Skills ADL;Coordination;Endurance;GMC;UE functional use;Balance;Decreased knowledge of precautions;Sensation;Pain;IADL;Flexibility;Decreased knowledge of use of DME;Body mechanics;Dexterity;FMC;Proprioception;Strength;Tone;ROM;Mobility;Edema    Rehab Potential Good    Clinical Decision Making Several treatment options, min-mod task modification necessary    Comorbidities Affecting Occupational Performance: May have comorbidities impacting occupational performance    Modification or Assistance to Complete Evaluation  Min-Moderate modification of tasks or assist with assess necessary to complete eval    OT Frequency 2x / week    OT Duration 6 weeks    OT Treatment/Interventions Self-care/ADL training;Electrical Stimulation;Therapeutic exercise;Patient/family education;Splinting;Neuromuscular education;Moist Heat;Fluidtherapy;Therapist, nutritional;Therapeutic activities;Balance training;Passive range of motion;Manual Therapy;DME and/or AE instruction;Contrast Bath;Ultrasound;Cryotherapy    Plan discharge     OT Home Exercise Plan Coordination, putty    Consulted and Agree with Plan of Care Patient           Patient will benefit from skilled therapeutic intervention in order to improve the following deficits and impairments:  Body Structure / Function / Physical Skills: ADL, Coordination, Endurance, GMC, UE functional use, Balance, Decreased knowledge of precautions, Sensation, Pain, IADL, Flexibility, Decreased knowledge of use of DME, Body mechanics, Dexterity, FMC, Proprioception, Strength, Tone, ROM, Mobility, Edema       Visit Diagnosis: Stiffness of right hand, not elsewhere classified  Stiffness of left hand, not elsewhere classified  Other lack of coordination  Muscle weakness (generalized)  Pain in right arm  Pain in left arm  Other disturbances of skin sensation  Unsteadiness on feet    Problem List Patient Active Problem List   Diagnosis Date Noted  . Lung cancer ([place]) 09/30/2012  . Bipolar affective disorder ([place]) 09/30/2012  . PTSD (post-traumatic stress disorder) 09/30/2012  . Preventative health care 03/31/2011  . BENIGN PROSTATIC HYPERTROPHY 09/28/2010  . Cough 05/10/2010  . DEPRESSION, MAJOR 09/26/2009  . FECAL INCONTINENCE 05/18/2009  . HYPERTENSION 03/20/2009  . VERTIGO, INTERMITTENT 03/28/2008  . DIABETES MELLITUS, TYPE II 10/08/2007  . HYPERLIPIDEMIA 10/08/2007  . ERECTILE DYSFUNCTION 10/08/2007  . ALLERGIC RHINITIS 10/08/2007  . SPINAL STENOSIS, LUMBAR 10/08/2007  . COLONIC POLYPS, HX OF 10/08/2007  . ANXIETY 08/10/2007  . CONSTIPATION, CHRONIC, HX OF 08/10/2007   OCCUPATIONAL THERAPY DISCHARGE SUMMARY  Visits from Start of Care: 6  Current functional level related to goals / functional outcomes: Improved hand range of motion and strength, decreased UE pain bilaterally, improved participation in ADL   Remaining deficits: Balance, LE weakness, decreased activity tolerance   Education / Equipment: Tub transfer bench, HEP strength  BUE Plan: Patient agrees to discharge.  Patient goals were met. Patient is being discharged due to meeting the stated rehab goals.  ?????     Mariah Milling, OTR/L  07/20/2020, 5:31 PM  [place] [address] [place], [place], [address] Phone: [phone]   Fax:  [phone]  Name: Jose Patrick MRN: [id number] Date of Birth: 23-Jan-1951

## 2020-07-25 ENCOUNTER — Ambulatory Visit: Payer: No Typology Code available for payment source | Admitting: Occupational Therapy

## 2020-07-27 ENCOUNTER — Ambulatory Visit: Payer: No Typology Code available for payment source | Admitting: Occupational Therapy

## 2020-08-01 ENCOUNTER — Encounter: Payer: Federal, State, Local not specified - PPO | Admitting: Occupational Therapy

## 2020-08-03 ENCOUNTER — Encounter: Payer: Federal, State, Local not specified - PPO | Admitting: Occupational Therapy

## 2020-08-08 ENCOUNTER — Encounter: Payer: Federal, State, Local not specified - PPO | Admitting: Occupational Therapy

## 2020-08-10 ENCOUNTER — Encounter: Payer: Federal, State, Local not specified - PPO | Admitting: Occupational Therapy

## 2020-08-15 ENCOUNTER — Encounter: Payer: Federal, State, Local not specified - PPO | Admitting: Occupational Therapy

## 2020-08-17 ENCOUNTER — Encounter: Payer: Federal, State, Local not specified - PPO | Admitting: Occupational Therapy

## 2021-04-02 ENCOUNTER — Other Ambulatory Visit: Payer: Self-pay

## 2021-04-02 ENCOUNTER — Ambulatory Visit (INDEPENDENT_AMBULATORY_CARE_PROVIDER_SITE_OTHER): Payer: No Typology Code available for payment source | Admitting: Gastroenterology

## 2021-04-02 ENCOUNTER — Encounter: Payer: Self-pay | Admitting: Gastroenterology

## 2021-04-02 VITALS — BP 129/67 | HR 73 | Temp 98.2°F | Ht 68.0 in | Wt 261.0 lb

## 2021-04-02 DIAGNOSIS — R21 Rash and other nonspecific skin eruption: Secondary | ICD-10-CM | POA: Insufficient documentation

## 2021-04-02 DIAGNOSIS — E538 Deficiency of other specified B group vitamins: Secondary | ICD-10-CM | POA: Insufficient documentation

## 2021-04-02 DIAGNOSIS — E291 Testicular hypofunction: Secondary | ICD-10-CM | POA: Insufficient documentation

## 2021-04-02 DIAGNOSIS — G4733 Obstructive sleep apnea (adult) (pediatric): Secondary | ICD-10-CM | POA: Insufficient documentation

## 2021-04-02 DIAGNOSIS — K08530 Fractured dental restorative material without loss of material: Secondary | ICD-10-CM | POA: Insufficient documentation

## 2021-04-02 DIAGNOSIS — Z86010 Personal history of colonic polyps: Secondary | ICD-10-CM | POA: Diagnosis not present

## 2021-04-02 DIAGNOSIS — H04123 Dry eye syndrome of bilateral lacrimal glands: Secondary | ICD-10-CM | POA: Insufficient documentation

## 2021-04-02 DIAGNOSIS — K219 Gastro-esophageal reflux disease without esophagitis: Secondary | ICD-10-CM | POA: Insufficient documentation

## 2021-04-02 DIAGNOSIS — Z0389 Encounter for observation for other suspected diseases and conditions ruled out: Secondary | ICD-10-CM | POA: Insufficient documentation

## 2021-04-02 DIAGNOSIS — F5221 Male erectile disorder: Secondary | ICD-10-CM | POA: Insufficient documentation

## 2021-04-02 DIAGNOSIS — M79641 Pain in right hand: Secondary | ICD-10-CM | POA: Insufficient documentation

## 2021-04-02 DIAGNOSIS — M25541 Pain in joints of right hand: Secondary | ICD-10-CM | POA: Insufficient documentation

## 2021-04-02 DIAGNOSIS — M25561 Pain in right knee: Secondary | ICD-10-CM | POA: Insufficient documentation

## 2021-04-02 DIAGNOSIS — G5603 Carpal tunnel syndrome, bilateral upper limbs: Secondary | ICD-10-CM | POA: Insufficient documentation

## 2021-04-02 DIAGNOSIS — E1165 Type 2 diabetes mellitus with hyperglycemia: Secondary | ICD-10-CM | POA: Insufficient documentation

## 2021-04-02 DIAGNOSIS — Z08 Encounter for follow-up examination after completed treatment for malignant neoplasm: Secondary | ICD-10-CM | POA: Insufficient documentation

## 2021-04-02 DIAGNOSIS — M25641 Stiffness of right hand, not elsewhere classified: Secondary | ICD-10-CM | POA: Insufficient documentation

## 2021-04-02 DIAGNOSIS — Z638 Other specified problems related to primary support group: Secondary | ICD-10-CM | POA: Insufficient documentation

## 2021-04-02 DIAGNOSIS — R1319 Other dysphagia: Secondary | ICD-10-CM

## 2021-04-02 DIAGNOSIS — M25542 Pain in joints of left hand: Secondary | ICD-10-CM | POA: Insufficient documentation

## 2021-04-02 DIAGNOSIS — Z79890 Hormone replacement therapy (postmenopausal): Secondary | ICD-10-CM | POA: Insufficient documentation

## 2021-04-02 DIAGNOSIS — R0600 Dyspnea, unspecified: Secondary | ICD-10-CM | POA: Insufficient documentation

## 2021-04-02 DIAGNOSIS — E559 Vitamin D deficiency, unspecified: Secondary | ICD-10-CM | POA: Insufficient documentation

## 2021-04-02 DIAGNOSIS — D0220 Carcinoma in situ of unspecified bronchus and lung: Secondary | ICD-10-CM | POA: Insufficient documentation

## 2021-04-02 DIAGNOSIS — M25579 Pain in unspecified ankle and joints of unspecified foot: Secondary | ICD-10-CM | POA: Insufficient documentation

## 2021-04-02 DIAGNOSIS — M25519 Pain in unspecified shoulder: Secondary | ICD-10-CM | POA: Insufficient documentation

## 2021-04-02 DIAGNOSIS — E1140 Type 2 diabetes mellitus with diabetic neuropathy, unspecified: Secondary | ICD-10-CM | POA: Insufficient documentation

## 2021-04-02 DIAGNOSIS — L603 Nail dystrophy: Secondary | ICD-10-CM | POA: Insufficient documentation

## 2021-04-02 NOTE — Progress Notes (Signed)
[name] [address]  [place]  [place], [place] [address]  Main: [phone]  Fax: [phone]   Gastroenterology Consultation  Referring Provider:     [facility] Primary Care Physician:  [facility], [facility] Reason for Consultation:    Dysphagia        HPI:    Chief Complaint  Patient presents with  . Dysphagia    Jose Patrick is a 70 y.o. y/o male referred for consultation & management  by Dr. Child psychotherapist, [employer]  Patient referred to [place] for dysphagia.  His records have not been scanned in yet so I am unable to view these records, but patient reports dysphagia to solid foods over 1 to 2 years.  No prior upper endoscopy.  No episodes of food impaction.  No nausea or vomiting or abdominal pain.  Patient also states that he was due for colonoscopy given his history of polyps and also wanted to make this appointment for that.  Patient describes having several polyps in the past and states he is past due for his next colonoscopy and [facility] was [facility] to get this [facility] in a timely fashion.  It appears he had a colonoscopy in September 2014 with Dr. Collene Mares at [facility].  This record was personally reviewed.  Indication was history of polyps.  An ascending colon polyp was removed via hot snare, 16 rectum and rectosigmoid polyps were removed via hot snare.  6 rectum polyps were removed with cold biopsy forceps.  Biopsy report showed hyperplastic polyps.  It appears in 2017 they tried to contact patient for repeat colonoscopy were unable to reach him.  There is also an note from October 2016 in [place] that states : "Called patient to let him know results of colonoscopy- internal hemorrohoids, polyps and poor prep. Recommend repeat in one year with 2 day prep. Will wait for pathology and call when we get the results."  I am unable to find the procedure or pathology report associated with this note from 2016 in Care  Everywhere  Past Medical History:  Diagnosis Date  . ALLERGIC RHINITIS 10/08/2007   Qualifier: Diagnosis of  By: Jenny Reichmann MD, Hunt Oris   . BENIGN PROSTATIC HYPERTROPHY 09/28/2010   Qualifier: Diagnosis of  By: Jenny Reichmann MD, Hunt Oris   . Bipolar affective disorder ([place]) 09/30/2012  . COLONIC POLYPS, HX OF 10/08/2007   Qualifier: Diagnosis of  By: Jenny Reichmann MD, Karilyn Cota, CHRONIC, HX OF 08/10/2007   Qualifier: Diagnosis of  By: Elveria Royals   . DIABETES MELLITUS, TYPE II 10/08/2007   Qualifier: Diagnosis of  By: Jenny Reichmann MD, [place] ERECTILE DYSFUNCTION 10/08/2007   Qualifier: Diagnosis of  By: Jenny Reichmann MD, Hunt Oris   . HYPERLIPIDEMIA 10/08/2007   Qualifier: Diagnosis of  By: Jenny Reichmann MD, Hunt Oris   . HYPERTENSION 03/20/2009   Qualifier: Diagnosis of  By: Jenny Reichmann MD, Hunt Oris   . PTSD (post-traumatic stress disorder) 09/30/2012  . SPINAL STENOSIS, LUMBAR 10/08/2007   Per Dr Arnoldo Morale - [facility]   Qualifier: Diagnosis of  By: Jenny Reichmann MD, Hunt Oris      Past Surgical History:  Procedure Laterality Date  . cyst off right buttock    . PREPATELLAR BURSA EXCISION     removal right  . s/p bilat hydrocelectomy  2008   x 2  . TONSILLECTOMY      Prior to Admission medications   Medication  Sig Start Date End Date Taking? Authorizing Provider  aspirin 81 MG chewable tablet Chew 1 tablet (81 mg total) by mouth daily. 12/19/11  Yes Biagio Borg, MD  Cholecalciferol (VITAMIN D) 2000 UNITS CAPS Take 1 capsule by mouth 2 (two) times daily.    Yes [provider]  clonazePAM (KLONOPIN) 0.5 MG tablet Take 0.5 mg by mouth 2 (two) times daily. 08/06/13  Yes [provider]  gabapentin (NEURONTIN) 800 MG tablet Take 800 mg by mouth 2 (two) times daily.   Yes [provider]  insulin glargine (LANTUS) 100 UNIT/ML injection 20 Units.   Yes [provider]  JARDIANCE 10 MG TABS tablet Take 10 mg by mouth daily. 02/23/21  Yes [provider]  metFORMIN  (GLUCOPHAGE) 500 MG tablet Take 1 tablet (500 mg total) by mouth 2 (two) times daily. 05/06/13  Yes Biagio Borg, MD  Pyridoxine HCl (VITAMIN B6 PO) Take 1 tablet by mouth daily.    Yes [provider]  QUEtiapine (SEROQUEL) 300 MG tablet Take 300 mg by mouth at bedtime.   Yes [provider]  Semaglutide,0.25 or 0.5MG/DOS, (OZEMPIC, 0.25 OR 0.5 MG/DOSE,) 2 MG/1.5ML SOPN Inject 0.5 mg into the skin once a week.   Yes [provider]  simvastatin (ZOCOR) 20 MG tablet TAKE ONE TABLET BY MOUTH EVERY DAY IN THE EVENING 04/13/13  Yes Biagio Borg, MD  sitaGLIPtin (JANUVIA) 100 MG tablet Take 100 mg by mouth daily.   Yes [provider]    Family History  Problem Relation Age of Onset  . Cancer Mother   . Ulcers Mother        Stomach  . Depression Mother   . Anxiety disorder Mother   . Allergies Sister      Social History   Tobacco Use  . Smoking status: Former Research scientist (life sciences)  . Smokeless tobacco: Never Used  Substance Use Topics  . Alcohol use: Yes  . Drug use: No    Allergies as of 04/02/2021 - Review Complete 04/02/2021  Allergen Reaction Noted  . Chlorzoxazone Swelling 08/15/2011  . Tramadol Other (See Comments) 03/10/2017    Review of Systems:    All systems reviewed and negative except where noted in HPI.   Physical Exam:  BP 129/67   Pulse 73   Temp 98.2 F (36.8 C) (Oral)   Ht _0  (1.727 m)   Wt 261 lb (118.4 kg)   BMI 39.68 kg/m  No LMP for male patient. Psych:  Alert and cooperative. Normal mood and affect. General:   Alert,  Well-developed, well-nourished, pleasant and cooperative in NAD Head:  Normocephalic and atraumatic. Eyes:  Sclera clear, no icterus.   Conjunctiva pink. Ears:  Normal auditory acuity. Nose:  No deformity, discharge, or lesions. Mouth:  No deformity or lesions,oropharynx pink & moist. Neck:  Supple; no masses or thyromegaly. Abdomen:  Normal bowel sounds.  No bruits.  Soft, non-tender and non-distended  without masses, hepatosplenomegaly or hernias noted.  No guarding or rebound tenderness.    Msk:  Symmetrical without gross deformities. Good, equal movement & strength bilaterally. Pulses:  Normal pulses noted. Extremities:  No clubbing or edema.  No cyanosis. Neurologic:  Alert and oriented x3;  grossly normal neurologically. Skin:  Intact without significant lesions or rashes. No jaundice. Lymph Nodes:  No significant cervical adenopathy. Psych:  Alert and cooperative. Normal mood and affect.   Labs: CBC    Component Value Date/Time   WBC 8.7 09/30/2012  1031   RBC 5.82 (H) 09/30/2012 1031   HGB 15.7 09/30/2012 1031   HCT 48.2 09/30/2012 1031   PLT 251.0 09/30/2012 1031   MCV 82.8 09/30/2012 1031   MCHC 32.5 09/30/2012 1031   RDW 15.2 (H) 09/30/2012 1031   LYMPHSABS 2.8 09/30/2012 1031   MONOABS 0.6 09/30/2012 1031   EOSABS 0.1 09/30/2012 1031   BASOSABS 0.0 09/30/2012 1031   CMP     Component Value Date/Time   NA 138 09/30/2012 1031   K 4.7 09/30/2012 1031   CL 102 09/30/2012 1031   CO2 28 09/30/2012 1031   GLUCOSE 142 (H) 09/30/2012 1031   BUN 13 09/30/2012 1031   CREATININE 0.9 09/30/2012 1031   CALCIUM 9.8 09/30/2012 1031   PROT 7.0 09/30/2012 1031   ALBUMIN 3.7 09/30/2012 1031   AST 19 09/30/2012 1031   ALT 25 09/30/2012 1031   ALKPHOS 65 09/30/2012 1031   BILITOT 0.6 09/30/2012 1031   GFRNONAA 106.92 09/21/2010 0803   GFRAA  02/23/2009 1513    >60        The eGFR has been calculated using the MDRD equation. This calculation has not been validated in all clinical situations. eGFR's persistently <60 mL/min signify possible Chronic Kidney Disease.    Imaging Studies: No results found.  Assessment and Plan:   Jose Patrick is a 70 y.o. y/o male has been referred for dysphagia  EGD indicated for further evaluation of dysphagia  Patient is also due for colonoscopy given that his last one showed a poor prep and repeat was recommended in 1 year  after 2016 as per care everywhere note detailed in HPI  Alternative options of conservative management were discussed in detail, including but not limited to medication management, foregoing endoscopic procedures at this time and others.    I have discussed alternative options, risks & benefits,  which include, but are not limited to, bleeding, infection, perforation,respiratory complication & drug reaction.  The patient agrees with this plan & written consent will be obtained.     Dr [name]  Speech recognition software was used to dictate the above note.

## 2021-04-04 ENCOUNTER — Telehealth: Payer: Self-pay

## 2021-04-04 NOTE — Telephone Encounter (Signed)
Patient called to inform office his name is wrong in the system. It should not be Sr. Went over colonoscopy instructions. Pt verbalized understanding.

## 2021-04-09 ENCOUNTER — Encounter
Admission: RE | Disposition: A | Discharge status: Home / Self Care | Payer: Self-pay | Source: Home / Self Care | Attending: Gastroenterology

## 2021-04-09 ENCOUNTER — Ambulatory Visit
Admission: RE | Admit: 2021-04-09 | Discharge: 2021-04-09 | Disposition: A | Discharge status: Home / Self Care | Payer: No Typology Code available for payment source | Attending: Gastroenterology | Admitting: Gastroenterology

## 2021-04-09 ENCOUNTER — Ambulatory Visit: Payer: No Typology Code available for payment source | Admitting: Anesthesiology

## 2021-04-09 ENCOUNTER — Encounter: Payer: Self-pay | Admitting: Gastroenterology

## 2021-04-09 DIAGNOSIS — R1319 Other dysphagia: Secondary | ICD-10-CM

## 2021-04-09 DIAGNOSIS — K319 Disease of stomach and duodenum, unspecified: Secondary | ICD-10-CM | POA: Insufficient documentation

## 2021-04-09 DIAGNOSIS — Z87891 Personal history of nicotine dependence: Secondary | ICD-10-CM | POA: Diagnosis not present

## 2021-04-09 DIAGNOSIS — Z86010 Personal history of colonic polyps: Secondary | ICD-10-CM | POA: Diagnosis not present

## 2021-04-09 DIAGNOSIS — R1310 Dysphagia, unspecified: Secondary | ICD-10-CM | POA: Diagnosis not present

## 2021-04-09 DIAGNOSIS — K635 Polyp of colon: Secondary | ICD-10-CM | POA: Insufficient documentation

## 2021-04-09 DIAGNOSIS — D123 Benign neoplasm of transverse colon: Secondary | ICD-10-CM | POA: Insufficient documentation

## 2021-04-09 DIAGNOSIS — K2289 Other specified disease of esophagus: Secondary | ICD-10-CM | POA: Diagnosis not present

## 2021-04-09 DIAGNOSIS — Z1211 Encounter for screening for malignant neoplasm of colon: Secondary | ICD-10-CM | POA: Insufficient documentation

## 2021-04-09 HISTORY — PX: COLONOSCOPY WITH PROPOFOL: SHX5780

## 2021-04-09 HISTORY — PX: ESOPHAGOGASTRODUODENOSCOPY (EGD) WITH PROPOFOL: SHX5813

## 2021-04-09 HISTORY — DX: Other complications of anesthesia, initial encounter: T88.59XA

## 2021-04-09 LAB — GLUCOSE, CAPILLARY: Glucose-Capillary: 97 mg/dL (ref 70–99)

## 2021-04-09 SURGERY — COLONOSCOPY WITH PROPOFOL
Anesthesia: General

## 2021-04-09 MED ORDER — SODIUM CHLORIDE 0.9 % IV SOLN: INTRAVENOUS | Status: DC

## 2021-04-09 MED ORDER — PROPOFOL 10 MG/ML IV BOLUS
INTRAVENOUS | Status: DC | PRN
  Administered 2021-04-09: 70 mg via INTRAVENOUS

## 2021-04-09 MED ORDER — PROPOFOL 500 MG/50ML IV EMUL
INTRAVENOUS | Status: DC | PRN
  Administered 2021-04-09: 140 ug/kg/min via INTRAVENOUS

## 2021-04-09 MED ORDER — LIDOCAINE HCL (CARDIAC) PF 100 MG/5ML IV SOSY
PREFILLED_SYRINGE | INTRAVENOUS | Status: DC | PRN
  Administered 2021-04-09: 60 mg via INTRAVENOUS

## 2021-04-09 MED ORDER — PHENYLEPHRINE HCL (PRESSORS) 10 MG/ML IV SOLN
INTRAVENOUS | Status: DC | PRN
  Administered 2021-04-09: 100 ug via INTRAVENOUS

## 2021-04-09 MED ORDER — GLYCOPYRROLATE 0.2 MG/ML IJ SOLN
INTRAMUSCULAR | Status: DC | PRN
  Administered 2021-04-09: .2 mg via INTRAVENOUS

## 2021-04-09 NOTE — Op Note (Signed)
[facility] Gastroenterology Patient Name: Jose Patrick Procedure Date: 04/09/2021 10:42 AM MRN: [id number] Account #: [id number] Date of Birth: [birth year] Admit Type: Outpatient Age: 70 Room: [facility] ENDO ROOM 2 Gender: Male Note Status: Finalized Procedure:             Upper GI endoscopy Indications:           Dysphagia Providers:             Nyeema Want B. Bonna Gains MD, MD Medicines:             Monitored Anesthesia Care Complications:         No immediate complications. Procedure:             Pre-Anesthesia Assessment:                        - Prior to the procedure, a History and Physical was                         performed, and patient medications, allergies and                         sensitivities were reviewed. The patient's tolerance                         of previous anesthesia was reviewed.                        - The risks and benefits of the procedure and the                         sedation options and risks were discussed with the                         patient. All questions were answered and informed                         consent was obtained.                        - Patient identification and proposed procedure were                         verified prior to the procedure by the physician, the                         nurse, the anesthesiologist, the anesthetist and the                         technician. The procedure was verified in the                         procedure room.                        - ASA Grade Assessment: II - A patient with mild                         systemic disease.  After obtaining informed consent, the endoscope was                         passed under direct vision. Throughout the procedure,                         the patient's blood pressure, pulse, and oxygen                         saturations were monitored continuously. The Endoscope                         was introduced through the  mouth, and advanced to the                         second part of duodenum. The upper GI endoscopy was                         accomplished with ease. The patient tolerated the                         procedure well. Findings:      The examined esophagus was normal.      There is no endoscopic evidence of stenosis or stricture in the entire       esophagus. Biopsies were obtained from the proximal and distal esophagus       with cold forceps for histology of suspected eosinophilic esophagitis.      Patchy mildly erythematous mucosa without bleeding was found in the       gastric antrum. Biopsies were taken with a cold forceps for histology.       Biopsies were obtained in the gastric body, at the incisura and in the       gastric antrum with cold forceps for histology.      The exam of the stomach was otherwise normal.      The duodenal bulb, second portion of the duodenum and examined duodenum       were normal. Impression:            - Normal esophagus.                        - Erythematous mucosa in the antrum. Biopsied.                        - Normal duodenal bulb, second portion of the duodenum                         and examined duodenum.                        - Biopsies were obtained in the gastric body, at the                         incisura and in the gastric antrum. Recommendation:        - Await pathology results.                        - Discharge patient to home (with escort).                        -  Advance diet as tolerated.                        - Continue present medications.                        - Patient has a contact number available for                         emergencies. The signs and symptoms of potential                         delayed complications were discussed with the patient.                         Return to normal activities tomorrow. Written                         discharge instructions were provided to the patient.                        -  Discharge patient to home (with escort).                        - The findings and recommendations were discussed with                         the patient.                        - The findings and recommendations were discussed with                         the patient's family. Procedure Code(s):     --- Professional ---                        [phone], Esophagogastroduodenoscopy, flexible,                         transoral; with biopsy, single or multiple Diagnosis Code(s):     --- Professional ---                        K31.89, Other diseases of stomach and duodenum                        R13.10, Dysphagia, unspecified CPT copyright 2019 American Medical Association. All rights reserved. The codes documented in this report are preliminary and upon coder review may  be revised to meet current compliance requirements.  [name], MD Margretta Sidle B. Bonna Gains MD, MD 04/09/2021 11:17:16 AM This report has been signed electronically. Number of Addenda: 0 Note Initiated On: 04/09/2021 10:42 AM Estimated Blood Loss:  Estimated blood loss: none.      [facility]

## 2021-04-09 NOTE — Anesthesia Preprocedure Evaluation (Signed)
Anesthesia Evaluation  Patient identified by MRN, date of birth, ID band Patient awake    Reviewed: Allergy & Precautions, NPO status , Patient's Chart, lab work & pertinent test results  History of Anesthesia Complications (+) PROLONGED EMERGENCE and history of anesthetic complications  Airway Mallampati: III  TM Distance: >3 FB Neck ROM: limited    Dental  (+) Chipped, Poor Dentition, Missing   Pulmonary neg shortness of breath, sleep apnea , COPD, former smoker,    Pulmonary exam normal        Cardiovascular Exercise Tolerance: Good hypertension, (-) angina(-) Past MI and (-) DOE Normal cardiovascular exam     Neuro/Psych PSYCHIATRIC DISORDERS  Neuromuscular disease    GI/Hepatic Neg liver ROS, GERD  Medicated and Controlled,  Endo/Other  diabetes, Type 2  Renal/GU negative Renal ROS  negative genitourinary   Musculoskeletal  (+) Arthritis ,   Abdominal   Peds  Hematology negative hematology ROS (+)   Anesthesia Other Findings Past Medical History: 10/08/2007: ALLERGIC RHINITIS     Comment:  Qualifier: Diagnosis of  By: Jenny Reichmann MD, Hunt Oris  09/28/2010: BENIGN PROSTATIC HYPERTROPHY     Comment:  Qualifier: Diagnosis of  By: Jenny Reichmann MD, Hunt Oris  09/30/2012: Bipolar affective disorder ([place]) 10/08/2007: COLONIC POLYPS, HX OF     Comment:  Qualifier: Diagnosis of  By: Jenny Reichmann MD, Hunt Oris  No date: Complication of anesthesia     Comment:  sleeps awhile afterwards per patient 08/10/2007: CONSTIPATION, CHRONIC, HX OF     Comment:  Qualifier: Diagnosis of  By: Elveria Royals  10/08/2007: DIABETES MELLITUS, TYPE II     Comment:  Qualifier: Diagnosis of  By: Jenny Reichmann MD, Hunt Oris  10/08/2007: ERECTILE DYSFUNCTION     Comment:  Qualifier: Diagnosis of  By: Jenny Reichmann MD, Hunt Oris  10/08/2007: HYPERLIPIDEMIA     Comment:  Qualifier: Diagnosis of  By: Jenny Reichmann MD, Hunt Oris  03/20/2009: HYPERTENSION     Comment:  Qualifier: Diagnosis of   By: Jenny Reichmann MD, Hunt Oris  09/30/2012: PTSD (post-traumatic stress disorder) 10/08/2007: SPINAL STENOSIS, LUMBAR     Comment:  Per Dr Arnoldo Morale - [facility]   Qualifier:               Diagnosis of  By: Jenny Reichmann MD, Hunt Oris    Past Surgical History: No date: cyst off right buttock No date: PREPATELLAR BURSA EXCISION     Comment:  removal right 2008: s/p bilat hydrocelectomy     Comment:  x 2 No date: TONSILLECTOMY  BMI    Body Mass Index: 38.77 kg/m      Reproductive/Obstetrics negative OB ROS                             Anesthesia Physical Anesthesia Plan  ASA: III  Anesthesia Plan: General   Post-op Pain Management:    Induction: Intravenous  PONV Risk Score and Plan: Propofol infusion and TIVA  Airway Management Planned: Natural Airway and Nasal Cannula  Additional Equipment:   Intra-op Plan:   Post-operative Plan:   Informed Consent: I have reviewed the patients History and Physical, chart, labs and discussed the procedure including the risks, benefits and alternatives for the proposed anesthesia with the patient or authorized representative who has indicated his/her understanding and acceptance.     Dental Advisory Given  Plan Discussed with: Anesthesiologist, CRNA and Surgeon  Anesthesia Plan Comments: (Patient consented for risks of  anesthesia including but not limited to:  - adverse reactions to medications - risk of airway placement if required - damage to eyes, teeth, lips or other oral mucosa - nerve damage due to positioning  - sore throat or hoarseness - Damage to heart, brain, nerves, lungs, other parts of body or loss of life  Patient voiced understanding.)        Anesthesia Quick Evaluation

## 2021-04-09 NOTE — Transfer of Care (Signed)
Immediate Anesthesia Transfer of Care Note  Patient: Jose Patrick  Procedure(s) Performed: COLONOSCOPY WITH PROPOFOL (N/A ) ESOPHAGOGASTRODUODENOSCOPY (EGD) WITH PROPOFOL (N/A )  Patient Location: PACU  Anesthesia Type:General  Level of Consciousness: sedated  Airway & Oxygen Therapy: Patient Spontanous Breathing and Patient connected to nasal cannula oxygen  Post-op Assessment: Report given to RN and Post -op Vital signs reviewed and stable  Post vital signs: Reviewed and stable  Last Vitals:  Vitals Value Taken Time  BP 90/67 04/09/21 1147  Temp    Pulse 85 04/09/21 1147  Resp 15 04/09/21 1147  SpO2 93 % 04/09/21 1147  Vitals shown include unvalidated device data.  Last Pain:  Vitals:   04/09/21 1018  TempSrc: Temporal  PainSc: 0-No pain         Complications: No complications documented.

## 2021-04-09 NOTE — Anesthesia Postprocedure Evaluation (Signed)
Anesthesia Post Note  Patient: Jose Patrick  Procedure(s) Performed: COLONOSCOPY WITH PROPOFOL (N/A ) ESOPHAGOGASTRODUODENOSCOPY (EGD) WITH PROPOFOL (N/A )  Patient location during evaluation: Endoscopy Anesthesia Type: General Level of consciousness: awake and alert Pain management: pain level controlled Vital Signs Assessment: post-procedure vital signs reviewed and stable Respiratory status: spontaneous breathing, nonlabored ventilation, respiratory function stable and patient connected to nasal cannula oxygen Cardiovascular status: blood pressure returned to baseline and stable Postop Assessment: no apparent nausea or vomiting Anesthetic complications: no   No complications documented.   Last Vitals:  Vitals:   04/09/21 1018 04/09/21 1148  BP: 140/72   Pulse: 89   Resp: 18   Temp: (!) 36.1 C (!) 36.1 C  SpO2: 96%     Last Pain:  Vitals:   04/09/21 1158  TempSrc:   PainSc: 0-No pain                 Precious Haws Kessa Fairbairn

## 2021-04-09 NOTE — H&P (Signed)
[name], MD [address] [place], [place], [place], [address] [address], [place], [place], [place], [address] Phone: [phone]  Fax: [phone]  Primary Care Physician:  [facility], [facility]   Pre-Procedure History & Physical: HPI:  Jose Patrick is a 70 y.o. male is here for a colonoscopy and EGD.   Past Medical History:  Diagnosis Date  . ALLERGIC RHINITIS 10/08/2007   Qualifier: Diagnosis of  By: Jenny Reichmann MD, Hunt Oris   . BENIGN PROSTATIC HYPERTROPHY 09/28/2010   Qualifier: Diagnosis of  By: Jenny Reichmann MD, Hunt Oris   . Bipolar affective disorder ([place]) 09/30/2012  . COLONIC POLYPS, HX OF 10/08/2007   Qualifier: Diagnosis of  By: Jenny Reichmann MD, Hunt Oris   . Complication of anesthesia    sleeps awhile afterwards per patient  . CONSTIPATION, CHRONIC, HX OF 08/10/2007   Qualifier: Diagnosis of  By: Elveria Royals   . DIABETES MELLITUS, TYPE II 10/08/2007   Qualifier: Diagnosis of  By: Jenny Reichmann MD, [place] ERECTILE DYSFUNCTION 10/08/2007   Qualifier: Diagnosis of  By: Jenny Reichmann MD, Hunt Oris   . HYPERLIPIDEMIA 10/08/2007   Qualifier: Diagnosis of  By: Jenny Reichmann MD, Hunt Oris   . HYPERTENSION 03/20/2009   Qualifier: Diagnosis of  By: Jenny Reichmann MD, Hunt Oris   . PTSD (post-traumatic stress disorder) 09/30/2012  . SPINAL STENOSIS, LUMBAR 10/08/2007   Per Dr Arnoldo Morale - [facility]   Qualifier: Diagnosis of  By: Jenny Reichmann MD, Hunt Oris      Past Surgical History:  Procedure Laterality Date  . cyst off right buttock    . PREPATELLAR BURSA EXCISION     removal right  . s/p bilat hydrocelectomy  2008   x 2  . TONSILLECTOMY      Prior to Admission medications   Medication Sig Start Date End Date Taking? Authorizing Provider  aspirin 81 MG chewable tablet Chew 1 tablet (81 mg total) by mouth daily. 12/19/11  Yes Biagio Borg, MD  Cholecalciferol (VITAMIN D) 2000 UNITS CAPS Take 1 capsule by mouth 2 (two) times daily.    Yes [provider]  gabapentin (NEURONTIN)  800 MG tablet Take 800 mg by mouth 2 (two) times daily.   Yes [provider]  insulin glargine (LANTUS) 100 UNIT/ML injection 20 Units.   Yes [provider]  JARDIANCE 10 MG TABS tablet Take 10 mg by mouth daily. 02/23/21  Yes [provider]  metFORMIN (GLUCOPHAGE) 500 MG tablet Take 1 tablet (500 mg total) by mouth 2 (two) times daily. 05/06/13  Yes Biagio Borg, MD  clonazePAM (KLONOPIN) 0.5 MG tablet Take 0.5 mg by mouth 2 (two) times daily. 08/06/13   [provider]  Pyridoxine HCl (VITAMIN B6 PO) Take 1 tablet by mouth daily.     [provider]  QUEtiapine (SEROQUEL) 300 MG tablet Take 300 mg by mouth at bedtime.    [provider]  Semaglutide,0.25 or 0.5MG /DOS, (OZEMPIC, 0.25 OR 0.5 MG/DOSE,) 2 MG/1.5ML SOPN Inject 0.5 mg into the skin once a week.    [provider]  simvastatin (ZOCOR) 20 MG tablet TAKE ONE TABLET BY MOUTH EVERY DAY IN THE EVENING 04/13/13   Biagio Borg, MD  sitaGLIPtin (JANUVIA) 100 MG tablet Take 100 mg by mouth daily. Patient not taking: Reported on 04/09/2021    [provider]    Allergies as of 04/02/2021 - Review Complete 04/02/2021  Allergen Reaction Noted  . Chlorzoxazone Swelling  08/15/2011  . Tramadol Other (See Comments) 03/10/2017    Family History  Problem Relation Age of Onset  . Cancer Mother   . Ulcers Mother        Stomach  . Depression Mother   . Anxiety disorder Mother   . Allergies Sister     Social History   Socioeconomic History  . Marital status: Married    Spouse name: Not on file  . Number of children: 4  . Years of education: Not on file  . Highest education level: Not on file  Occupational History  . 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 Smoking status: Former Research scientist (life sciences)  . Smokeless tobacco: Never Used  Substance and Sexual Activity  . Alcohol use: Yes  . Drug  use: No  . Sexual activity: Not on file  Other Topics Concern  . Not on file  Social History Narrative  . Not on file   Social Determinants of Health   Financial Resource Strain: Not on file  Food Insecurity: Not on file  Transportation Needs: Not on file  Physical Activity: Not on file  Stress: Not on file  Social Connections: Not on file  Intimate Partner Violence: Not on file    Review of Systems: See HPI, otherwise negative ROS  Physical Exam: BP 140/72   Pulse 89   Temp (!) 97 F (36.1 C) (Temporal)   Resp 18   Ht 5\' 8"  (1.727 m)   Wt 115.7 kg   SpO2 96%   BMI 38.77 kg/m  General:   Alert,  pleasant and cooperative in NAD Head:  Normocephalic and atraumatic. Neck:  Supple; no masses or thyromegaly. Lungs:  Clear throughout to auscultation, normal respiratory effort.    Heart:  +S1, +S2, Regular rate and rhythm, No edema. Abdomen:  Soft, nontender and nondistended. Normal bowel sounds, without guarding, and without rebound.   Neurologic:  Alert and  oriented x4;  grossly normal neurologically.  Impression/Plan: Jose Patrick is here for a colonoscopy to be performed for history of colon polyps, pathology report from 2016 not available, and EGD for dysphagia.  Risks, benefits, limitations, and alternatives regarding the procedures have been reviewed with the patient.  Questions have been answered.  All parties agreeable.   Virgel Manifold, MD  04/09/2021, 10:40 AM

## 2021-04-09 NOTE — Op Note (Signed)
[facility] Gastroenterology Patient Name: Jose Patrick Procedure Date: 04/09/2021 10:41 AM MRN: [id number] Account #: [id number] Date of Birth: 1951-05-14 Admit Type: Outpatient Age: 70 Room: [facility] ENDO ROOM 2 Gender: Male Note Status: Finalized Procedure:             Colonoscopy Indications:           High risk colon cancer surveillance: Personal history                         of colonic polyps Providers:             Joclyn Alsobrook B. Bonna Gains MD, MD Medicines:             Monitored Anesthesia Care Complications:         No immediate complications. Procedure:             Pre-Anesthesia Assessment:                        - ASA Grade Assessment: II - A patient with mild                         systemic disease.                        - Prior to the procedure, a History and Physical was                         performed, and patient medications, allergies and                         sensitivities were reviewed. The patient's tolerance                         of previous anesthesia was reviewed.                        - The risks and benefits of the procedure and the                         sedation options and risks were discussed with the                         patient. All questions were answered and informed                         consent was obtained.                        - Patient identification and proposed procedure were                         verified prior to the procedure by the physician, the                         nurse, the anesthesiologist, the anesthetist and the                         technician. The procedure was verified in the  procedure room.                        After obtaining informed consent, the colonoscope was                         passed under direct vision. Throughout the procedure,                         the patient's blood pressure, pulse, and oxygen                         saturations were monitored  continuously. The                         Colonoscope was introduced through the anus and                         advanced to the the cecum, identified by appendiceal                         orifice and ileocecal valve. The colonoscopy was                         performed with ease. The patient tolerated the                         procedure well. The quality of the bowel preparation                         was good. Findings:      The perianal and digital rectal examinations were normal.      Three flat and sessile polyps were found in the transverse colon. The       polyps were 4 to 6 mm in size. These polyps were removed with a cold       snare. Resection and retrieval were complete.      Three flat polyps were found in the sigmoid colon. The polyps were 5 to       7 mm in size. These polyps were removed with a cold snare. Resection and       retrieval were complete. Multiple hyperplastic appearing polyps were       seen in this area and the largest of these were removed as a       representative sample to confirm these are hyperplastic polyps.      The exam was otherwise without abnormality.      The rectum, sigmoid colon, descending colon, transverse colon, ascending       colon and cecum appeared normal.      The retroflexed view of the distal rectum and anal verge was normal and       showed no anal or rectal abnormalities. Impression:            - Three 4 to 6 mm polyps in the transverse colon,                         removed with a cold snare. Resected and retrieved.                        -  Three 5 to 7 mm polyps in the sigmoid colon, removed                         with a cold snare. Resected and retrieved.                        - The examination was otherwise normal.                        - The rectum, sigmoid colon, descending colon,                         transverse colon, ascending colon and cecum are normal.                        - The distal rectum and anal verge  are normal on                         retroflexion view. Recommendation:        - Discharge patient to home (with escort).                        - Advance diet as tolerated.                        - Continue present medications.                        - Await pathology results.                        - Repeat colonoscopy date to be determined after                         pending pathology results are reviewed.                        - The findings and recommendations were discussed with                         the patient.                        - The findings and recommendations were discussed with                         the patient's family.                        - Return to primary care physician as previously                         scheduled. Procedure Code(s):     --- Professional ---                        [id number], Colonoscopy, flexible; with removal of                         tumor(s), polyp(s), or other lesion(s) by snare  technique Diagnosis Code(s):     --- Professional ---                        Z86.010, Personal history of colonic polyps                        K63.5, Polyp of colon CPT copyright 2019 American Medical Association. All rights reserved. The codes documented in this report are preliminary and upon coder review may  be revised to meet current compliance requirements.  [name], MD Margretta Sidle B. Bonna Gains MD, MD 04/09/2021 11:51:43 AM This report has been signed electronically. Number of Addenda: 0 Note Initiated On: 04/09/2021 10:41 AM Scope Withdrawal Time: 0 hours 19 minutes 37 seconds  Total Procedure Duration: 0 hours 22 minutes 50 seconds  Estimated Blood Loss:  Estimated blood loss: none.      [facility]

## 2021-04-10 ENCOUNTER — Encounter: Payer: Self-pay | Admitting: Gastroenterology

## 2021-04-10 LAB — SURGICAL PATHOLOGY

## 2021-05-23 ENCOUNTER — Telehealth (HOSPITAL_COMMUNITY): Payer: Self-pay

## 2021-05-23 NOTE — Telephone Encounter (Signed)
Pt called wanting to schedule for pulmonary rehab, I advised pt that we have received his [facility] referral for pulmonary rehab. I advised pt of our referral process and of our backlog right now and that we will get in touch with him at a later date.

## 2021-05-26 ENCOUNTER — Emergency Department (HOSPITAL_COMMUNITY): Payer: No Typology Code available for payment source

## 2021-05-26 ENCOUNTER — Other Ambulatory Visit: Payer: Self-pay

## 2021-05-26 ENCOUNTER — Emergency Department (HOSPITAL_COMMUNITY)
Admission: EM | Admit: 2021-05-26 | Discharge: 2021-05-26 | Disposition: A | Discharge status: Home / Self Care | Payer: No Typology Code available for payment source | Attending: Emergency Medicine | Admitting: Emergency Medicine

## 2021-05-26 DIAGNOSIS — R531 Weakness: Secondary | ICD-10-CM | POA: Diagnosis not present

## 2021-05-26 DIAGNOSIS — Z87891 Personal history of nicotine dependence: Secondary | ICD-10-CM | POA: Insufficient documentation

## 2021-05-26 DIAGNOSIS — Z85118 Personal history of other malignant neoplasm of bronchus and lung: Secondary | ICD-10-CM | POA: Insufficient documentation

## 2021-05-26 DIAGNOSIS — I10 Essential (primary) hypertension: Secondary | ICD-10-CM | POA: Insufficient documentation

## 2021-05-26 DIAGNOSIS — Z79899 Other long term (current) drug therapy: Secondary | ICD-10-CM | POA: Diagnosis not present

## 2021-05-26 DIAGNOSIS — R2243 Localized swelling, mass and lump, lower limb, bilateral: Secondary | ICD-10-CM | POA: Diagnosis not present

## 2021-05-26 DIAGNOSIS — J449 Chronic obstructive pulmonary disease, unspecified: Secondary | ICD-10-CM | POA: Diagnosis not present

## 2021-05-26 DIAGNOSIS — Z794 Long term (current) use of insulin: Secondary | ICD-10-CM | POA: Diagnosis not present

## 2021-05-26 DIAGNOSIS — U071 COVID-19: Secondary | ICD-10-CM | POA: Insufficient documentation

## 2021-05-26 DIAGNOSIS — Z7982 Long term (current) use of aspirin: Secondary | ICD-10-CM | POA: Diagnosis not present

## 2021-05-26 DIAGNOSIS — E119 Type 2 diabetes mellitus without complications: Secondary | ICD-10-CM | POA: Insufficient documentation

## 2021-05-26 DIAGNOSIS — R0602 Shortness of breath: Secondary | ICD-10-CM | POA: Diagnosis present

## 2021-05-26 LAB — CBC WITH DIFFERENTIAL/PLATELET
Abs Immature Granulocytes: 0.02 10*3/uL (ref 0.00–0.07)
Basophils Absolute: 0 10*3/uL (ref 0.0–0.1)
Basophils Relative: 0 %
Eosinophils Absolute: 0 10*3/uL (ref 0.0–0.5)
Eosinophils Relative: 1 %
HCT: 44 % (ref 39.0–52.0)
Hemoglobin: 13.7 g/dL (ref 13.0–17.0)
Immature Granulocytes: 0 %
Lymphocytes Relative: 21 %
Lymphs Abs: 1.4 10*3/uL (ref 0.7–4.0)
MCH: 26 pg (ref 26.0–34.0)
MCHC: 31.1 g/dL (ref 30.0–36.0)
MCV: 83.5 fL (ref 80.0–100.0)
Monocytes Absolute: 0.8 10*3/uL (ref 0.1–1.0)
Monocytes Relative: 12 %
Neutro Abs: 4.4 10*3/uL (ref 1.7–7.7)
Neutrophils Relative %: 66 %
Platelets: 218 10*3/uL (ref 150–400)
RBC: 5.27 MIL/uL (ref 4.22–5.81)
RDW: 14.3 % (ref 11.5–15.5)
WBC: 6.6 10*3/uL (ref 4.0–10.5)
nRBC: 0 % (ref 0.0–0.2)

## 2021-05-26 LAB — COMPREHENSIVE METABOLIC PANEL
ALT: 37 U/L (ref 0–44)
AST: 31 U/L (ref 15–41)
Albumin: 4.2 g/dL (ref 3.5–5.0)
Alkaline Phosphatase: 45 U/L (ref 38–126)
Anion gap: 8 (ref 5–15)
BUN: 19 mg/dL (ref 8–23)
CO2: 30 mmol/L (ref 22–32)
Calcium: 10 mg/dL (ref 8.9–10.3)
Chloride: 103 mmol/L (ref 98–111)
Creatinine, Ser: 1.16 mg/dL (ref 0.61–1.24)
GFR, Estimated: >60 mL/min (ref >60)
Glucose, Bld: 102 mg/dL — ABNORMAL HIGH (ref 70–99)
Potassium: 4.1 mmol/L (ref 3.5–5.1)
Sodium: 141 mmol/L (ref 135–145)
Total Bilirubin: 0.4 mg/dL (ref 0.3–1.2)
Total Protein: 7.4 g/dL (ref 6.5–8.1)

## 2021-05-26 LAB — RESP PANEL BY RT-PCR (FLU A&B, COVID) ARPGX2
Influenza A by PCR: NEGATIVE
Influenza B by PCR: NEGATIVE
SARS Coronavirus 2 by RT PCR: POSITIVE — AB

## 2021-05-26 MED ORDER — NIRMATRELVIR/RITONAVIR (PAXLOVID) TABLET (RENAL DOSING): 2.0000 | ORAL_TABLET | Freq: Two times a day (BID) | ORAL | 0 refills | Status: AC

## 2021-05-26 NOTE — ED Triage Notes (Signed)
Pt came in with c/o weakness and worsening SOB. Pt tested positive for Covid at home one hour ago. 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 Pt saturations 96% on RA

## 2021-05-26 NOTE — ED Provider Notes (Signed)
[facility] DEPT Provider Note   CSN: [id number] Arrival date & time: 05/26/21  0010     History Chief Complaint  Patient presents with   Shortness of Breath   Weakness    Jose Patrick is a 70 y.o. male.  Patient is a 70 year old male with past medical history of lung cancer status post lobectomy, hypertension, diabetes, hyperlipidemia.  Patient presented today for evaluation of weakness for the past 2 days, then cough that began earlier today.  He states he had a positive COVID test at home just prior to presentation.  He denies fevers or chills.  He does report some shortness of breath, but denies chest pain.  The history is provided by the patient.  Shortness of Breath Severity:  Mild Onset quality:  Gradual Duration:  2 days Timing:  Constant Progression:  Worsening Chronicity:  New Context: URI   Relieved by:  Nothing Worsened by:  Nothing Weakness Associated symptoms: shortness of breath       Past Medical History:  Diagnosis Date   ALLERGIC RHINITIS 10/08/2007   Qualifier: Diagnosis of  By: Jenny Reichmann MD, [place] PROSTATIC HYPERTROPHY 09/28/2010   Qualifier: Diagnosis of  By: Jenny Reichmann MD, [place] Oris    Bipolar affective disorder ([place]) 09/30/2012   COLONIC POLYPS, HX OF 10/08/2007   Qualifier: Diagnosis of  By: Jenny Reichmann MD, [place] Oris    Complication of anesthesia    sleeps awhile afterwards per patient   CONSTIPATION, CHRONIC, HX OF 08/10/2007   Qualifier: Diagnosis of  By: [place], [place], TYPE II 10/08/2007   Qualifier: Diagnosis of  By: Jenny Reichmann MD, Hunt Oris    ERECTILE DYSFUNCTION 10/08/2007   Qualifier: Diagnosis of  By: Jenny Reichmann MD, [place] 10/08/2007   Qualifier: Diagnosis of  By: Jenny Reichmann MD, [place] Oris    HYPERTENSION 03/20/2009   Qualifier: Diagnosis of  By: Jenny Reichmann MD, [place] Oris    PTSD (post-traumatic stress disorder) 09/30/2012   SPINAL STENOSIS, LUMBAR 10/08/2007   Per Dr Arnoldo Morale - [facility]   Qualifier: Diagnosis of  By: Jenny Reichmann MD, [place] Oris      Patient Active Problem List   Diagnosis Date Noted   Hx of colonic polyps    Other dysphagia    Gastric erythema    Hypogonadism in male 04/02/2021   Pain involving joints of fingers of both hands 04/02/2021   Carcinoma in situ of unspecified bronchus and lung 04/02/2021   Carpal tunnel syndrome, bilateral upper limbs 04/02/2021   Dry eye syndrome of bilateral lacrimal glands 04/02/2021   Dyspnea, unspecified 04/02/2021   Encounter for follow-up examination after completed treatment for malignant neoplasm 04/02/2021   Fractured dental restorative material without loss of material 04/02/2021   Gastroesophageal reflux disease 04/02/2021   Hormone replacement therapy 04/02/2021   Type 2 diabetes mellitus with hyperglycemia ([place]) 04/02/2021   Male erectile disorder (CODE) 04/02/2021   Nail dystrophy 04/02/2021   Observation and evaluation for other specified suspected conditions 04/02/2021   Obstructive sleep apnea (adult) (pediatric) 04/02/2021   Pain in joint involving ankle and foot 04/02/2021   Shoulder pain 04/02/2021   Pain in right hand 04/02/2021   Pain in right knee 04/02/2021   Rash and other nonspecific skin eruption 04/02/2021   Stiffness of right hand, not elsewhere classified 04/02/2021   Stress due to family tension 04/02/2021   Type 2 diabetes mellitus with  diabetic neuropathy, unspecified ([place]) 04/02/2021   Vitamin B12 deficiency 04/02/2021   Vitamin D deficiency 04/02/2021   Cervical radiculopathy due to degenerative joint disease of spine 08/23/2019   Abdominal bloating 05/10/2015   Colon polyps 05/10/2015   Rectal pain 05/10/2015   COPD (chronic obstructive pulmonary disease) ([place]) 01/22/2014   Respiratory infection 12/27/2013   Schizoaffective disorder ([place]) 10/25/2013   S/P lobectomy of lung 01/15/2013   Lung cancer ([place]) 09/30/2012   Bipolar affective disorder ([place]) 09/30/2012   PTSD  (post-traumatic stress disorder) 09/30/2012   Non-small cell lung cancer ([place]) 01/09/2012   Preventative health care 03/31/2011   BENIGN PROSTATIC HYPERTROPHY 09/28/2010   Cough 05/10/2010   DEPRESSION, MAJOR 09/26/2009   FECAL INCONTINENCE 05/18/2009   HYPERTENSION 03/20/2009   VERTIGO, INTERMITTENT 03/28/2008   DIABETES MELLITUS, TYPE II 10/08/2007   HYPERLIPIDEMIA 10/08/2007   ERECTILE DYSFUNCTION 10/08/2007   ALLERGIC RHINITIS 10/08/2007   SPINAL STENOSIS, LUMBAR 10/08/2007   COLONIC POLYPS, HX OF 10/08/2007   ANXIETY 08/10/2007   CONSTIPATION, CHRONIC, HX OF 08/10/2007    Past Surgical History:  Procedure Laterality Date   COLONOSCOPY WITH PROPOFOL N/A 04/09/2021   Procedure: COLONOSCOPY WITH PROPOFOL;  Surgeon: Virgel Manifold, MD;  Location: [facility] ENDOSCOPY;  Service: Endoscopy;  Laterality: N/A;   cyst off right buttock     ESOPHAGOGASTRODUODENOSCOPY (EGD) WITH PROPOFOL N/A 04/09/2021   Procedure: ESOPHAGOGASTRODUODENOSCOPY (EGD) WITH PROPOFOL;  Surgeon: Virgel Manifold, MD;  Location: [facility] ENDOSCOPY;  Service: Endoscopy;  Laterality: N/A;   PREPATELLAR BURSA EXCISION     removal right   s/p bilat hydrocelectomy  2008   x 2   TONSILLECTOMY         Family History  Problem Relation Age of Onset   Cancer Mother    Ulcers Mother        Stomach   Depression Mother    Anxiety disorder Mother    Allergies Sister     Social History   Tobacco Use   Smoking status: Former    Pack years: 0.00   Smokeless tobacco: Never  Substance Use Topics   Alcohol use: Yes   Drug use: No    Home Medications Prior to Admission medications   Medication Sig Start Date End Date Taking? Authorizing Provider  aspirin 81 MG chewable tablet Chew 1 tablet (81 mg total) by mouth daily. 12/19/11   Biagio Borg, MD  Cholecalciferol (VITAMIN D) 2000 UNITS CAPS Take 1 capsule by mouth 2 (two) times daily.     [provider]  clonazePAM (KLONOPIN) 0.5 MG tablet Take 0.5  mg by mouth 2 (two) times daily. 08/06/13   [provider]  gabapentin (NEURONTIN) 800 MG tablet Take 800 mg by mouth 2 (two) times daily.    [provider]  insulin glargine (LANTUS) 100 UNIT/ML injection 20 Units.    [provider]  JARDIANCE 10 MG TABS tablet Take 10 mg by mouth daily. 02/23/21   [provider]  metFORMIN (GLUCOPHAGE) 500 MG tablet Take 1 tablet (500 mg total) by mouth 2 (two) times daily. 05/06/13   Biagio Borg, MD  Pyridoxine HCl (VITAMIN B6 PO) Take 1 tablet by mouth daily.     [provider]  QUEtiapine (SEROQUEL) 300 MG tablet Take 300 mg by mouth at bedtime.    [provider]  Semaglutide,0.25 or 0.5MG /DOS, (OZEMPIC, 0.25 OR 0.5 MG/DOSE,) 2 MG/1.5ML SOPN Inject 0.5 mg into the skin once a week.    [provider]  simvastatin (ZOCOR) 20 MG tablet TAKE ONE TABLET BY MOUTH EVERY DAY IN THE EVENING 04/13/13   Biagio Borg, MD  sitaGLIPtin (JANUVIA) 100 MG tablet Take 100 mg by mouth daily. Patient not taking: Reported on 04/09/2021    [provider]    Allergies    Chlorzoxazone and Tramadol  Review of Systems   Review of Systems  Respiratory:  Positive for shortness of breath.   Neurological:  Positive for weakness.  All other systems reviewed and are negative.  Physical Exam Updated Vital Signs BP 114/61   Pulse 83   Temp 99.8 F (37.7 C) (Oral)   Resp (!) 24   Ht 5\' 8"  (1.727 m)   Wt 113.4 kg   SpO2 94%   BMI 38.01 kg/m   Physical Exam Vitals and nursing note reviewed.  Constitutional:      General: He is not in acute distress.    Appearance: He is well-developed. He is not diaphoretic.  HENT:     Head: Normocephalic and atraumatic.  Cardiovascular:     Rate and Rhythm: Normal rate and regular rhythm.     Heart sounds: No murmur heard.   No friction rub.  Pulmonary:     Effort: Pulmonary effort is normal. No respiratory distress.     Breath sounds: Normal breath  sounds. No wheezing or rales.  Abdominal:     General: Bowel sounds are normal. There is no distension.     Palpations: Abdomen is soft.     Tenderness: There is no abdominal tenderness.  Musculoskeletal:        General: Normal range of motion.     Cervical back: Normal range of motion and neck supple.     Right lower leg: No tenderness. Edema present.     Left lower leg: No tenderness. Edema present.     Comments: There is trace edema of both lower extremities  Skin:    General: Skin is warm and dry.  Neurological:     Mental Status: He is alert and oriented to person, place, and time.     Coordination: Coordination normal.    ED Results / Procedures / Treatments   Labs (all labs ordered are listed, but only abnormal results are displayed) Labs Reviewed  RESP PANEL BY RT-PCR (FLU A&B, COVID) [id number]  COMPREHENSIVE METABOLIC PANEL  CBC WITH DIFFERENTIAL/PLATELET    EKG None  Radiology No results found.  Procedures Procedures   Medications Ordered in ED Medications - No data to display  ED Course  I have reviewed the triage vital signs and the nursing notes.  Pertinent labs & imaging results that were available during my care of the patient were reviewed by me and considered in my medical decision making (see chart for details).    MDM Rules/Calculators/A&P  Patient presenting here with URI symptoms and positive home COVID test.  COVID positivity confirmed here.  Laboratory studies are reassuring, chest x-ray is clear, and vital signs show no hypoxia and are otherwise stable.  Due to patient's comorbidities, BMI, and age, he does qualify for paxlovid.  This will be prescribed.  Final Clinical Impression(s) / ED Diagnoses Final diagnoses:  None    Rx / DC Orders ED Discharge Orders     None        Veryl Speak, MD 05/26/21 [phone]

## 2021-05-26 NOTE — Discharge Instructions (Addendum)
Begin taking Paxlovid as prescribed.  Isolate at home for the next 5 days to avoid infecting others.  Return to the emergency department if you develop severe chest pain, difficulty breathing, or other new and concerning symptoms.

## 2021-06-01 NOTE — Telephone Encounter (Signed)
Pt is covered thru the [place]. Auth# [id number].

## 2021-06-01 NOTE — Telephone Encounter (Signed)
Called patient to see if he is interested in the Pulmonary Rehab Program. Patient expressed interest. Explained scheduling process and went over insurance, patient verbalized understanding. Will contact patient for scheduling at a later date. (1-3 months)

## 2021-06-04 ENCOUNTER — Encounter (HOSPITAL_COMMUNITY): Payer: Self-pay | Admitting: *Deleted

## 2021-06-04 NOTE — Progress Notes (Signed)
Received [facility] authorization - [id number] from Dr. Gwenette Greet  for this veteran to participate in Pulmonary Rehab with the diagnosis of COPD. Clinical revRiew of pt follow up appt on 6/15 Pulmonary office note.  Pt with Covid Risk Score - 6. Pt appropriate for scheduling for Pulmonary rehab.  Will forward to support staff for scheduling. Cherre Huger, BSN Cardiac and Training and development officer

## 2021-06-21 ENCOUNTER — Telehealth (HOSPITAL_COMMUNITY): Payer: Self-pay

## 2021-06-25 ENCOUNTER — Telehealth (HOSPITAL_COMMUNITY): Payer: Self-pay

## 2021-06-25 NOTE — Telephone Encounter (Signed)
Pt called wanting to know the status of his pulmonary rehab referral, I advised pt that we have received his referral from the [place] and that he has been cleared to participate. I reminded pt of the 1-3 month backlog we have and advised him that he has about 21 pt in front of  him before we could schedule. Pt understood, I advised pt that we would call him at a later date to schedule.

## 2021-07-02 ENCOUNTER — Encounter: Payer: Self-pay | Admitting: Gastroenterology

## 2021-07-04 ENCOUNTER — Other Ambulatory Visit: Payer: Self-pay

## 2021-07-04 ENCOUNTER — Ambulatory Visit (INDEPENDENT_AMBULATORY_CARE_PROVIDER_SITE_OTHER): Payer: No Typology Code available for payment source | Admitting: Gastroenterology

## 2021-07-04 VITALS — BP 149/77 | HR 64 | Temp 98.2°F | Ht 68.0 in | Wt 253.8 lb

## 2021-07-04 DIAGNOSIS — R1319 Other dysphagia: Secondary | ICD-10-CM | POA: Diagnosis not present

## 2021-07-04 NOTE — Progress Notes (Signed)
[name], MD [address]  [place]  [place], [place] [address]  Main: [phone]  Fax: [phone]   Primary Care Physician: [facility], [facility]   Chief complaint: Dysphagia  HPI: Jose Patrick is a 70 y.o. male here for follow-up of dysphagia.  Patient states that he feels a tightness in his throat when he swallows liquids or solids.  No episodes of food impaction.  EGD did not show any obstructive lesions and biopsies did not show any evidence of EOE.  He is feels that the symptoms started after his neck surgery.  No weight loss, no nausea or vomiting.   ROS: All ROS reviewed and negative except as per HPI   Past Medical History:  Diagnosis Date   ALLERGIC RHINITIS 10/08/2007   Qualifier: Diagnosis of  By: Jenny Reichmann MD, [place] PROSTATIC HYPERTROPHY 09/28/2010   Qualifier: Diagnosis of  By: Jenny Reichmann MD, [place] Oris    Bipolar affective disorder ([place]) 09/30/2012   COLONIC POLYPS, HX OF 10/08/2007   Qualifier: Diagnosis of  By: Jenny Reichmann MD, [place] Oris    Complication of anesthesia    sleeps awhile afterwards per patient   CONSTIPATION, CHRONIC, HX OF 08/10/2007   Qualifier: Diagnosis of  By: [place], [place], TYPE II 10/08/2007   Qualifier: Diagnosis of  By: Jenny Reichmann MD, Hunt Oris    ERECTILE DYSFUNCTION 10/08/2007   Qualifier: Diagnosis of  By: Jenny Reichmann MD, [place] Oris    HYPERLIPIDEMIA 10/08/2007   Qualifier: Diagnosis of  By: Jenny Reichmann MD, [place] Oris    HYPERTENSION 03/20/2009   Qualifier: Diagnosis of  By: Jenny Reichmann MD, [place] Oris    PTSD (post-traumatic stress disorder) 09/30/2012   SPINAL STENOSIS, LUMBAR 10/08/2007   Per Dr Arnoldo Morale - [facility]   Qualifier: Diagnosis of  By: Jenny Reichmann MD, [place] Oris      Past Surgical History:  Procedure Laterality Date   COLONOSCOPY WITH PROPOFOL N/A 04/09/2021   Procedure: COLONOSCOPY WITH PROPOFOL;  Surgeon: Virgel Manifold, MD;  Location: [facility] ENDOSCOPY;  Service: Endoscopy;  Laterality: N/A;    cyst off right buttock     ESOPHAGOGASTRODUODENOSCOPY (EGD) WITH PROPOFOL N/A 04/09/2021   Procedure: ESOPHAGOGASTRODUODENOSCOPY (EGD) WITH PROPOFOL;  Surgeon: Virgel Manifold, MD;  Location: [facility] ENDOSCOPY;  Service: Endoscopy;  Laterality: N/A;   PREPATELLAR BURSA EXCISION     removal right   s/p bilat hydrocelectomy  2008   x 2   TONSILLECTOMY      Prior to Admission medications   Medication Sig Start Date End Date Taking? Authorizing Provider  aspirin 81 MG chewable tablet Chew 1 tablet (81 mg total) by mouth daily. 12/19/11  Yes Biagio Borg, MD  Cholecalciferol (VITAMIN D) 2000 UNITS CAPS Take 1 capsule by mouth 2 (two) times daily.    Yes [provider]  clonazePAM (KLONOPIN) 0.5 MG tablet Take 0.5 mg by mouth 2 (two) times daily. 08/06/13  Yes [provider]  gabapentin (NEURONTIN) 800 MG tablet Take 800 mg by mouth 2 (two) times daily.   Yes [provider]  insulin glargine (LANTUS) 100 UNIT/ML injection 20 Units.   Yes [provider]  JARDIANCE 10 MG TABS tablet Take 10 mg by mouth daily. 02/23/21  Yes [provider]  metFORMIN (GLUCOPHAGE) 500 MG tablet Take 1 tablet (500 mg total) by mouth 2 (two) times daily. 05/06/13  Yes Biagio Borg, MD  Pyridoxine HCl (VITAMIN B6 PO)  Take 1 tablet by mouth daily.    Yes [provider]  QUEtiapine (SEROQUEL) 300 MG tablet Take 300 mg by mouth at bedtime.   Yes [provider]  Semaglutide,0.25 or 0.5MG/DOS, (OZEMPIC, 0.25 OR 0.5 MG/DOSE,) 2 MG/1.5ML SOPN Inject 0.5 mg into the skin once a week.   Yes [provider]  sitaGLIPtin (JANUVIA) 100 MG tablet Take 100 mg by mouth daily.   Yes [provider]    Family History  Problem Relation Age of Onset   Cancer Mother    Ulcers Mother        Stomach   Depression Mother    Anxiety disorder Mother    Allergies Sister      Social History   Tobacco Use   Smoking status: Former   Smokeless tobacco:  Never  Substance Use Topics   Alcohol use: Yes   Drug use: No    Allergies as of 07/04/2021 - Review Complete 07/04/2021  Allergen Reaction Noted   Chlorzoxazone Swelling 08/15/2011   Tramadol Other (See Comments) 03/10/2017    Physical Examination:  Constitutional: General:   Alert,  Well-developed, well-nourished, pleasant and cooperative in NAD BP (!) 149/77   Pulse 64   Temp 98.2 F (36.8 C) (Oral)   Ht _0  (1.727 m)   Wt 253 lb 12.8 oz (115.1 kg)   BMI 38.59 kg/m   Respiratory: Normal respiratory effort  Gastrointestinal:  Soft, non-tender and non-distended without masses, hepatosplenomegaly or hernias noted.  No guarding or rebound tenderness.     Cardiac: No clubbing or edema.  No cyanosis. Normal posterior tibial pedal pulses noted.  Psych:  Alert and cooperative. Normal mood and affect.  Musculoskeletal:  Normal gait. Head normocephalic, atraumatic. Symmetrical without gross deformities. 5/5 Lower extremity strength bilaterally.  Skin: Warm. Intact without significant lesions or rashes. No jaundice.  Neck: Supple, trachea midline  Lymph: No cervical lymphadenopathy  Psych:  Alert and oriented x3, Alert and cooperative. Normal mood and affect.  Labs: CMP     Component Value Date/Time   NA 141 05/26/2021 0416   K 4.1 05/26/2021 0416   CL 103 05/26/2021 0416   CO2 30 05/26/2021 0416   GLUCOSE 102 (H) 05/26/2021 0416   BUN 19 05/26/2021 0416   CREATININE 1.16 05/26/2021 0416   CALCIUM 10.0 05/26/2021 0416   PROT 7.4 05/26/2021 0416   ALBUMIN 4.2 05/26/2021 0416   AST 31 05/26/2021 0416   ALT 37 05/26/2021 0416   ALKPHOS 45 05/26/2021 0416   BILITOT 0.4 05/26/2021 0416   GFRNONAA >60 05/26/2021 0416   GFRAA  02/23/2009 1513    >60        The eGFR has been calculated using the MDRD equation. This calculation has not been validated in all clinical situations. eGFR's persistently <60 mL/min signify possible Chronic Kidney Disease.   Lab  Results  Component Value Date   WBC 6.6 05/26/2021   HGB 13.7 05/26/2021   HCT 44.0 05/26/2021   MCV 83.5 05/26/2021   PLT 218 05/26/2021    Imaging Studies:   Assessment and Plan:   Mindy Behnken is a 70 y.o. y/o male here for follow-up of dysphagia  His upper endoscopy did not show any etiology of his symptoms  Will proceed with upper GI study to evaluate for cricopharyngeal bar versus cervical osteophytes given the symptoms that he is reporting as per HPI  If this is negative, may need to consider swallow study or motility study  No alarm symptoms present at this time  Dr [name]

## 2021-07-04 NOTE — Patient Instructions (Signed)
We have scheduled you for an upper GI study on July 16, 2021  You will need to go to [facility] entrance and arrive at 8:30 AM  You cannot have anything to eat or drink after 36midnight, the day before  If you need to reschedule, you can call [phone]

## 2021-07-11 NOTE — Telephone Encounter (Signed)
[facility] called wanting to know if pt was schedule for pulmonary rehab. I advised her that the pt has about 6 pt in front of him before he can get scheduled I advised the [facility] that we have a 1-3 month backlog as of right now and that we would call them once the pt is scheduled

## 2021-07-16 ENCOUNTER — Encounter: Payer: Self-pay | Admitting: Gastroenterology

## 2021-07-16 ENCOUNTER — Other Ambulatory Visit: Payer: Self-pay

## 2021-07-16 ENCOUNTER — Other Ambulatory Visit: Payer: Self-pay | Admitting: Gastroenterology

## 2021-07-16 ENCOUNTER — Telehealth: Payer: Self-pay | Admitting: Gastroenterology

## 2021-07-16 ENCOUNTER — Ambulatory Visit
Admission: RE | Admit: 2021-07-16 | Discharge: 2021-07-16 | Disposition: A | Discharge status: Home / Self Care | Payer: No Typology Code available for payment source | Source: Ambulatory Visit | Attending: Gastroenterology | Admitting: Gastroenterology

## 2021-07-16 DIAGNOSIS — R1319 Other dysphagia: Secondary | ICD-10-CM

## 2021-07-16 NOTE — Telephone Encounter (Signed)
Please call [facility] radiology dept. Pt. Needs order for Barium swallow. Fax number is [phone].

## 2021-07-16 NOTE — Telephone Encounter (Signed)
Spoke to [facility] radiology and they reported that per radiologist UGI changed to [facility]... please advise

## 2021-07-20 ENCOUNTER — Telehealth: Payer: Self-pay

## 2021-07-20 NOTE — Telephone Encounter (Signed)
Referral form, procedure, progress notes, insurance card and demographics attached 

## 2021-08-10 ENCOUNTER — Telehealth (HOSPITAL_COMMUNITY): Payer: Self-pay

## 2021-08-10 NOTE — Telephone Encounter (Signed)
Samantha from the [place] called and left a message to see if the pt has been scheduled for pulmonary rehab. I called Samantha back and left a message for her stating that the pt has not been scheduled yet and that he has about 6-8 pt in front of him before we can schedule him and I also advised her on the message that we still have that 1-3 month backlog. Azalee Course that she can call back if she would like.

## 2021-08-14 ENCOUNTER — Telehealth: Payer: Self-pay

## 2021-08-14 NOTE — Telephone Encounter (Signed)
Pt Left message on voicemail reporting that he believes he has an increase in GERD Sx and had an episode of vomiting.

## 2021-08-14 NOTE — Telephone Encounter (Signed)
I called pt and he reports LUQ abd pain with intermittent nausea, vomited 1 time and has a burning sensation when he swallows.... He also reports he feels thirsty and has been having loose stools, not watery  I have pt scheduled for 08/15/21 at 3:30PM for OV

## 2021-08-15 ENCOUNTER — Other Ambulatory Visit: Payer: Self-pay

## 2021-08-15 ENCOUNTER — Ambulatory Visit (INDEPENDENT_AMBULATORY_CARE_PROVIDER_SITE_OTHER): Payer: No Typology Code available for payment source | Admitting: Gastroenterology

## 2021-08-15 ENCOUNTER — Encounter: Payer: Self-pay | Admitting: Gastroenterology

## 2021-08-15 VITALS — BP 137/67 | HR 58 | Temp 99.0°F | Wt 248.2 lb

## 2021-08-15 DIAGNOSIS — R140 Abdominal distension (gaseous): Secondary | ICD-10-CM | POA: Diagnosis not present

## 2021-08-15 DIAGNOSIS — R195 Other fecal abnormalities: Secondary | ICD-10-CM | POA: Diagnosis not present

## 2021-08-15 MED ORDER — METAMUCIL SMOOTH TEXTURE 58.6 % PO POWD: 1.0000 | Freq: Every day | ORAL | 0 refills | Status: DC

## 2021-08-15 NOTE — Progress Notes (Signed)
Jose Bouillon, MD [address]  [address]  [place], [place] [address]  Main: [phone]  Fax: [phone]   Primary Care Physician: [facility]   Chief Complaint  Patient presents with   Follow-up    reports LUQ abd pain with intermittent nausea, vomited 1 time and has a burning sensation when he swallows.... He also reports he feels thirsty and has been having loose stools, not watery    HPI: Jose Patrick is a 70 y.o. male reports having 1 day of nausea vomiting last week.  No hematemesis.  This has since resolved.  Did have globus sensation after a few episodes of emesis, but this has improved.  Has also noticed a change in bowel movements since his colonoscopy and is reporting having 3 bowel movements in the mornings that are small, and softer than his baseline.  At baseline only used to have 1-2 bowel movements.  Reports being on Dulcolax chronically and stopped this as he heard you are not supposed to take the medication for a long time.  States he used to take this for constipation.  No blood in stool.  Patient underwent EGD and upper GI series neither of which showed any obstructive findings.  His dysphagia has improved.  Most recent labs reviewed and otherwise reassuring with no anemia present on CBC.  Normal liver enzymes on CMP.  ROS: All ROS reviewed and negative except as per HPI   Past Medical History:  Diagnosis Date   ALLERGIC RHINITIS 10/08/2007   Qualifier: Diagnosis of  By: Jonny Ruiz MD, Len Blalock    BENIGN PROSTATIC HYPERTROPHY 09/28/2010   Qualifier: Diagnosis of  By: Jonny Ruiz MD, Len Blalock    Bipolar affective disorder (HCC) 09/30/2012   COLONIC POLYPS, HX OF 10/08/2007   Qualifier: Diagnosis of  By: Jonny Ruiz MD, Len Blalock    Complication of anesthesia    sleeps awhile afterwards per patient   CONSTIPATION, CHRONIC, HX OF 08/10/2007   Qualifier: Diagnosis of  By: Maris Berger    DIABETES MELLITUS, TYPE II 10/08/2007   Qualifier:  Diagnosis of  By: Jonny Ruiz MD, Len Blalock    ERECTILE DYSFUNCTION 10/08/2007   Qualifier: Diagnosis of  By: Jonny Ruiz MD, Len Blalock    HYPERLIPIDEMIA 10/08/2007   Qualifier: Diagnosis of  By: Jonny Ruiz MD, Len Blalock    HYPERTENSION 03/20/2009   Qualifier: Diagnosis of  By: Jonny Ruiz MD, Len Blalock    PTSD (post-traumatic stress disorder) 09/30/2012   SPINAL STENOSIS, LUMBAR 10/08/2007   Per Dr Lovell Sheehan - [facility]   Qualifier: Diagnosis of  By: Jonny Ruiz MD, Len Blalock      Past Surgical History:  Procedure Laterality Date   COLONOSCOPY WITH PROPOFOL N/A 04/09/2021   Procedure: COLONOSCOPY WITH PROPOFOL;  Surgeon: Pasty Spillers, MD;  Location: [facility] ENDOSCOPY;  Service: Endoscopy;  Laterality: N/A;   cyst off right buttock     ESOPHAGOGASTRODUODENOSCOPY (EGD) WITH PROPOFOL N/A 04/09/2021   Procedure: ESOPHAGOGASTRODUODENOSCOPY (EGD) WITH PROPOFOL;  Surgeon: Pasty Spillers, MD;  Location: [facility] ENDOSCOPY;  Service: Endoscopy;  Laterality: N/A;   PREPATELLAR BURSA EXCISION     removal right   s/p bilat hydrocelectomy  2008   x 2   TONSILLECTOMY      Prior to Admission medications   Medication Sig Start Date End Date Taking? Authorizing Provider  aspirin 81 MG chewable tablet Chew 1 tablet (81 mg total) by mouth daily. 12/19/11  Yes Corwin Levins, MD  Cholecalciferol (  VITAMIN D) 2000 UNITS CAPS Take 1 capsule by mouth 2 (two) times daily.    Yes [provider]  clonazePAM (KLONOPIN) 0.5 MG tablet Take 0.5 mg by mouth 2 (two) times daily. 08/06/13  Yes [provider]  gabapentin (NEURONTIN) 800 MG tablet Take 800 mg by mouth 2 (two) times daily.   Yes [provider]  insulin glargine (LANTUS) 100 UNIT/ML injection 20 Units.   Yes [provider]  JARDIANCE 10 MG TABS tablet Take 10 mg by mouth daily. 02/23/21  Yes [provider]  metFORMIN (GLUCOPHAGE) 500 MG tablet Take 1 tablet (500 mg total) by mouth 2 (two) times daily. 05/06/13  Yes Biagio Borg, MD   psyllium (METAMUCIL SMOOTH TEXTURE) 58.6 % powder Take 1 packet by mouth daily. 08/15/21 11/13/21 Yes [name] B, MD  Pyridoxine HCl (VITAMIN B6 PO) Take 1 tablet by mouth daily.    Yes [provider]  QUEtiapine (SEROQUEL) 300 MG tablet Take 300 mg by mouth at bedtime.   Yes [provider]  Semaglutide,0.25 or 0.5MG /DOS, (OZEMPIC, 0.25 OR 0.5 MG/DOSE,) 2 MG/1.5ML SOPN Inject 0.5 mg into the skin once a week.   Yes [provider]  sitaGLIPtin (JANUVIA) 100 MG tablet Take 100 mg by mouth daily.   Yes [provider]    Family History  Problem Relation Age of Onset   Cancer Mother    Ulcers Mother        Stomach   Depression Mother    Anxiety disorder Mother    Allergies Sister      Social History   Tobacco Use   Smoking status: Former   Smokeless tobacco: Never  Substance Use Topics   Alcohol use: Yes   Drug use: No    Allergies as of 08/15/2021 - Review Complete 08/15/2021  Allergen Reaction Noted   Chlorzoxazone Swelling 08/15/2011   Tramadol Other (See Comments) 03/10/2017    Physical Examination:  Constitutional: General:   Alert,  Well-developed, well-nourished, pleasant and cooperative in NAD BP 137/67   Pulse (!) 58   Temp 99 F (37.2 C) (Oral)   Wt 248 lb 3.2 oz (112.6 kg)   BMI 37.74 kg/m   Respiratory: Normal respiratory effort  Gastrointestinal:  Soft, non-tender and non-distended without masses, hepatosplenomegaly or hernias noted.  No guarding or rebound tenderness.     Cardiac: No clubbing or edema.  No cyanosis. Normal posterior tibial pedal pulses noted.  Psych:  Alert and cooperative. Normal mood and affect.  Musculoskeletal:  Normal gait. Head normocephalic, atraumatic. Symmetrical without gross deformities. 5/5 Lower extremity strength bilaterally.  Skin: Warm. Intact without significant lesions or rashes. No jaundice.  Neck: Supple, trachea midline  Lymph: No cervical  lymphadenopathy  Psych:  Alert and oriented x3, Alert and cooperative. Normal mood and affect.  Labs: CMP     Component Value Date/Time   NA 141 05/26/2021 0416   K 4.1 05/26/2021 0416   CL 103 05/26/2021 0416   CO2 30 05/26/2021 0416   GLUCOSE 102 (H) 05/26/2021 0416   BUN 19 05/26/2021 0416   CREATININE 1.16 05/26/2021 0416   CALCIUM 10.0 05/26/2021 0416   PROT 7.4 05/26/2021 0416   ALBUMIN 4.2 05/26/2021 0416   AST 31 05/26/2021 0416   ALT 37 05/26/2021 0416   ALKPHOS 45 05/26/2021 0416   BILITOT 0.4 05/26/2021 0416   GFRNONAA >60 05/26/2021 0416   GFRAA  02/23/2009 1513    >60  The eGFR has been calculated using the MDRD equation. This calculation has not been validated in all clinical situations. eGFR's persistently <60 mL/min signify possible Chronic Kidney Disease.   Lab Results  Component Value Date   WBC 6.6 05/26/2021   HGB 13.7 05/26/2021   HCT 44.0 05/26/2021   MCV 83.5 05/26/2021   PLT 218 05/26/2021    Imaging Studies:   Assessment and Plan:   Lamarr Feenstra is a 70 y.o. y/o male here for follow-up and is reporting an episode of nausea vomiting for 1 day last week, and alteration in bowel habits as well  Given nausea vomiting and possible loose stools, will rule out infectious etiology  He is also reporting abdominal bloating.  This may be related to incomplete evacuation given that he is having 3 small bowel movements in the morning, compared to his baseline, and this may be causing the bloating.  However, I will check H. pylori breath test as well  Start Metamucil to allow for better evacuation with 1-2 bowel movements a day instead of 3 small bowel movements that he is having.  Since he also reports a bowel movements are softer and if he is having loose stools, Metamucil will also help bulk stool and make it more formed  Patient is requesting the Metamucil prescription to be sent to the pharmacy as he states since he is a [place] patient,  it is covered for him. Prescribed  Dysphagia has improved at this time.  And there have been no obstructive findings on upper endoscopy or upper GI series.  If symptoms return, consider swallow study, versus a motility study.  Patient states symptoms seem to have started after his neck surgery.  Upper GI series was done to look for cricopharyngeal bar but was not seen on that study as well     Dr [name]

## 2021-08-16 ENCOUNTER — Telehealth: Payer: Self-pay | Admitting: Gastroenterology

## 2021-08-16 ENCOUNTER — Other Ambulatory Visit: Payer: Self-pay | Admitting: Gastroenterology

## 2021-08-16 LAB — H. PYLORI BREATH TEST: H pylori Breath Test: NEGATIVE

## 2021-08-16 NOTE — Telephone Encounter (Signed)
Pt.. needs paperwork from pre op 04/02/21 and 04/10/21 to be faxed over to the [place] at [phone]. Attention Travel Dept. Pt requesting call back if you have any questions. Almost hard to understand the request that is needed

## 2021-08-17 ENCOUNTER — Encounter (INDEPENDENT_AMBULATORY_CARE_PROVIDER_SITE_OTHER): Payer: Self-pay

## 2021-08-20 LAB — GI PROFILE, STOOL, PCR

## 2021-08-20 NOTE — Telephone Encounter (Signed)
Left message on voicemail  I need to know what exactly he needs from me.... I can give a note saying he was seen in the office at his office visit and possibly discharge summary from procedure day. He had nothing done on May 24th, his procedure was May 23rd

## 2021-08-23 ENCOUNTER — Telehealth: Payer: Self-pay | Admitting: Gastroenterology

## 2021-08-23 NOTE — Telephone Encounter (Signed)
Pt. Says that he was supposed to get a prescription for metamucil to send to the [facility]

## 2021-08-28 NOTE — Telephone Encounter (Signed)
This is not a Rx and is purchased over the counter

## 2021-08-28 NOTE — Telephone Encounter (Signed)
Left message on voicemail.

## 2021-08-28 NOTE — Telephone Encounter (Signed)
Letter faxed with date of OV and procedure to [facility] per pt request

## 2021-09-06 NOTE — Telephone Encounter (Signed)
Contacted the [facility] and advised them of the pt's pulmonary rehab scheduled date and they sending over a new authorization with the new dates.

## 2021-09-06 NOTE — Telephone Encounter (Signed)
Called patient to see if he was interested in participating in the Pulmonary Rehab Program. Patient stated yes. Patient will come in for orientation on 09/10/2021@9 :00am and will attend the 10:15am exercise class.

## 2021-09-10 ENCOUNTER — Other Ambulatory Visit: Payer: Self-pay

## 2021-09-10 ENCOUNTER — Encounter (HOSPITAL_COMMUNITY)
Admission: RE | Admit: 2021-09-10 | Discharge: 2021-09-10 | Disposition: A | Discharge status: Home / Self Care | Payer: No Typology Code available for payment source | Source: Ambulatory Visit | Attending: Pulmonary Disease | Admitting: Pulmonary Disease

## 2021-09-10 ENCOUNTER — Encounter (HOSPITAL_COMMUNITY): Payer: Self-pay

## 2021-09-10 VITALS — BP 130/60 | HR 61 | Ht 68.0 in | Wt 245.6 lb

## 2021-09-10 DIAGNOSIS — J449 Chronic obstructive pulmonary disease, unspecified: Secondary | ICD-10-CM | POA: Diagnosis present

## 2021-09-10 NOTE — Progress Notes (Signed)
Jose Patrick arrived for orientation/walk test in pulmonary rehab today.  He was A&O, equal grips, lungs clear, no peripheral edema, bilateral 2+ posterior tibial pulses present.

## 2021-09-10 NOTE — Progress Notes (Signed)
Jose Patrick 71 y.o. male  Initial Psychosocial Assessment  Pt psychosocial assessment reveals pt lives with their spouse. Wife assists him with household duties that he cannot do. Pt is currently unemployed, disabled from [employer]. Pt current hobbies are going to the [employer] to do his water aerobics and use the spa. Pt reports he does not have stress. Pt does not exhibit signs of depression. [name] has Bipolar disorder and is not currently taking them due to side effects. Pt shows good  coping skills with positive outlook. Offered emotional support and reassurance.Will continue to monitor.    09/10/2021 11:12 AM

## 2021-09-10 NOTE — Progress Notes (Addendum)
Jose Patrick 70 y.o. male Pulmonary Rehab Orientation Note This patient who was referred to Pulmonary Rehab by Dr. Gwenette Greet ([place]) with the diagnosis of COPD Stage II arrived today in Cardiac and Pulmonary Rehab. He arrived with ambulatory normal gait. He does not carry portable oxygen. He does have a CPAP for OSA but is not compliant. Color good, skin warm and dry. Patient is oriented to time and place. Patient's medical history, psychosocial health, and medications reviewed. Psychosocial assessment reveals pt lives with spouse. Jose Patrick is currently unemployed, disabled. Pt regularly attends [employer] to do water aerobics and use the spa. Pt reports does not report any stress. Pt does not exhibit signs of depression. PHQ2/9 score 0/3. Jose Patrick shows good  coping skills with positive outlook on life. Will continue to monitor. Physical assessment reveals heart rate is normal, breath sounds clear to auscultation, no wheezes, rales, or rhonchi. Grip strength equal, strong. Distal pulses present. Jose Patrick reports he takes most medications as prescribed. Jose Patrick does not take his Bipolar medications due to side effects. Patient states he  follows a Regular diet. The patient has been trying to lose weight through a healthy diet and exercise program.. Patient's weight will be monitored closely. Demonstration and practice of PLB using pulse oximeter. Jose Patrick able to return demonstration satisfactorily. Safety and hand hygiene in the exercise area reviewed with patient. Jose Patrick voices understanding of the information reviewed. Department expectations discussed with patient and achievable goals were set. The patient shows enthusiasm about attending the program and we look forward to working with Jose Patrick. Jose Patrick completed a 6 min walk test today and is scheduled to begin exercise on 09/18/21 at 10:15am.   9:10-11:50  Jose Else, MS, ACSM-CEP

## 2021-09-10 NOTE — Progress Notes (Signed)
Pulmonary Individual Treatment Plan  Patient Details  Name: Jose Patrick MRN: [id number] Date of Birth: 05/22/1951 Referring Provider:   April Manson Pulmonary Rehab Walk Test from 09/10/2021 in [place]  Referring Provider Loanne Drilling  [[facility]]       Initial Encounter Date:  Flowsheet Row Pulmonary Rehab Walk Test from 09/10/2021 in [place]  Date 09/10/21       Visit Diagnosis: Stage 2 moderate COPD by GOLD classification ([place])  Patient's Home Medications on Admission:   Current Outpatient Medications:    aspirin 81 MG chewable tablet, Chew 1 tablet (81 mg total) by mouth daily., Disp: 90 tablet, Rfl: 3   Cholecalciferol (VITAMIN D) 2000 UNITS CAPS, Take 1 capsule by mouth 2 (two) times daily. , Disp: , Rfl:    gabapentin (NEURONTIN) 800 MG tablet, Take 800 mg by mouth 2 (two) times daily., Disp: , Rfl:    JARDIANCE 10 MG TABS tablet, Take 10 mg by mouth daily., Disp: , Rfl:    Pyridoxine HCl (VITAMIN B6 PO), Take 1 tablet by mouth daily. , Disp: , Rfl:    Semaglutide,0.25 or 0.5MG /DOS, (OZEMPIC, 0.25 OR 0.5 MG/DOSE,) 2 MG/1.5ML SOPN, Inject 0.5 mg into the skin once a week., Disp: , Rfl:    sitaGLIPtin (JANUVIA) 100 MG tablet, Take 100 mg by mouth daily., Disp: , Rfl:    clonazePAM (KLONOPIN) 0.5 MG tablet, Take 0.5 mg by mouth 2 (two) times daily. (Patient not taking: Reported on 09/10/2021), Disp: , Rfl:    insulin glargine (LANTUS) 100 UNIT/ML injection, 20 Units. (Patient not taking: Reported on 09/10/2021), Disp: , Rfl:    metFORMIN (GLUCOPHAGE) 500 MG tablet, Take 1 tablet (500 mg total) by mouth 2 (two) times daily., Disp: 180 tablet, Rfl: 3   psyllium (METAMUCIL SMOOTH TEXTURE) 58.6 % powder, Take 1 packet by mouth daily. (Patient not taking: Reported on 09/10/2021), Disp: 90 packet, Rfl: 0   QUEtiapine (SEROQUEL) 300 MG tablet, Take 300 mg by mouth at bedtime. (Patient not taking: Reported on 09/10/2021),  Disp: , Rfl:   Past Medical History: Past Medical History:  Diagnosis Date   ALLERGIC RHINITIS 10/08/2007   Qualifier: Diagnosis of  By: Jenny Reichmann MD, [place] PROSTATIC HYPERTROPHY 09/28/2010   Qualifier: Diagnosis of  By: Jenny Reichmann MD, [place]    Bipolar affective disorder ([place]) 09/30/2012   COLONIC POLYPS, HX OF 10/08/2007   Qualifier: Diagnosis of  By: Jenny Reichmann MD, [place]    Complication of anesthesia    sleeps awhile afterwards per patient   CONSTIPATION, CHRONIC, HX OF 08/10/2007   Qualifier: Diagnosis of  By: [place], [place], TYPE II 10/08/2007   Qualifier: Diagnosis of  By: Jenny Reichmann MD, [place]    ERECTILE DYSFUNCTION 10/08/2007   Qualifier: Diagnosis of  By: Jenny Reichmann MD, [place]    HYPERLIPIDEMIA 10/08/2007   Qualifier: Diagnosis of  By: Jenny Reichmann MD, [place]    HYPERTENSION 03/20/2009   Qualifier: Diagnosis of  By: Jenny Reichmann MD, [place]    PTSD (post-traumatic stress disorder) 09/30/2012   SPINAL STENOSIS, LUMBAR 10/08/2007   Per Dr Arnoldo Morale - [facility]   Qualifier: Diagnosis of  By: Jenny Reichmann MD, [place]      Tobacco Use: Social History   Tobacco Use  Smoking Status Former   Packs/day: 1.50   Years: 41.00   Pack years: 61.50   Types: Cigarettes  Quit date: 2013   Years since quitting: 9.8  Smokeless Tobacco Never    Labs: Recent Review Flowsheet Data     Labs for ITP Cardiac and Pulmonary Rehab Latest Ref Rng & Units 07/03/2009 05/10/2010 09/21/2010 04/02/2011 09/30/2012   Cholestrol 0 - 200 mg/dL 121 174 198 173 142   LDLCALC 0 - 99 mg/dL 70 108(H) 138(H) 114(H) 85   LDLDIRECT mg/dL - - - - -   HDL >39.00 mg/dL 38.70(L) 49.10 42.20 44.80 40.90   Trlycerides 0.0 - 149.0 mg/dL 63.0 85.0 90.0 71.0 83.0   Hemoglobin A1c 4.6 - 6.5 % 7.1(H) 7.9(H) - 6.6(H) 7.6(H)       Capillary Blood Glucose: Lab Results  Component Value Date   GLUCAP 97 04/09/2021   GLUCAP 141 (H) 01/01/2012   GLUCAP 138 (H) 03/16/2010   GLUCAP 117 (H) 03/16/2010    GLUCAP 172 (H) 03/03/2009     Pulmonary Assessment Scores:  Pulmonary Assessment Scores     Row Name 09/10/21 1014         ADL UCSD   ADL Phase Entry     SOB Score total 58       CAT Score   CAT Score 23       mMRC Score   mMRC Score 4             UCSD: Self-administered rating of dyspnea associated with activities of daily living (ADLs) 6-point scale (0 = "not at all" to 5 = "maximal or unable to do because of breathlessness")  Scoring Scores range from 0 to 120.  Minimally important difference is 5 units  CAT: CAT can identify the health impairment of COPD patients and is better correlated with disease progression.  CAT has a scoring range of zero to 40. The CAT score is classified into four groups of low (less than 10), medium (10 - 20), high (21-30) and very high (31-40) based on the impact level of disease on health status. A CAT score over 10 suggests significant symptoms.  A worsening CAT score could be explained by an exacerbation, poor medication adherence, poor inhaler technique, or progression of COPD or comorbid conditions.  CAT MCID is 2 points  mMRC: mMRC (Modified Medical Research Council) Dyspnea Scale is used to assess the degree of baseline functional disability in patients of respiratory disease due to dyspnea. No minimal important difference is established. A decrease in score of 1 point or greater is considered a positive change.   Pulmonary Function Assessment:   Exercise Target Goals: Exercise Program Goal: Individual exercise prescription set using results from initial 6 min walk test and THRR while considering  patient's activity barriers and safety.   Exercise Prescription Goal: Initial exercise prescription builds to 30-45 minutes a day of aerobic activity, 2-3 days per week.  Home exercise guidelines will be given to patient during program as part of exercise prescription that the participant will acknowledge.  Activity Barriers & Risk  Stratification:  Activity Barriers & Cardiac Risk Stratification - 09/10/21 1003       Activity Barriers & Cardiac Risk Stratification   Activity Barriers Neck/Spine Problems;Back Problems;Deconditioning;Muscular Weakness;Shortness of Breath;Balance Concerns;History of Falls;Assistive Device;Arthritis    Cardiac Risk Stratification High             6 Minute Walk:  6 Minute Walk     Row Name 09/10/21 1121         6 Minute Walk   Phase Initial     Distance  628 feet     Walk Time 6 minutes     # of Rest Breaks 0     MPH 1.19     METS 0.96     RPE 11     Perceived Dyspnea  1     VO2 Peak 3.37     Symptoms No     Resting HR 74 bpm     Resting BP 130/60     Resting Oxygen Saturation  97 %     Exercise Oxygen Saturation  during 6 min walk 96 %     Max Ex. HR 79 bpm     Max Ex. BP 132/60     2 Minute Post BP 120/58       Interval HR   1 Minute HR 77     2 Minute HR 83     3 Minute HR 78     4 Minute HR 78     5 Minute HR 79     6 Minute HR 78     2 Minute Post HR 63     Interval Heart Rate? Yes       Interval Oxygen   Interval Oxygen? Yes     1 Minute Oxygen Saturation % 97 %     1 Minute Liters of Oxygen 0 L     2 Minute Oxygen Saturation % 96 %     2 Minute Liters of Oxygen 0 L     3 Minute Oxygen Saturation % 97 %     3 Minute Liters of Oxygen 0 L     4 Minute Oxygen Saturation % 96 %     4 Minute Liters of Oxygen 0 L     5 Minute Oxygen Saturation % 96 %     5 Minute Liters of Oxygen 0 L     6 Minute Oxygen Saturation % 96 %     6 Minute Liters of Oxygen 0 L     2 Minute Post Oxygen Saturation % 96 %     2 Minute Post Liters of Oxygen 0 L              Oxygen Initial Assessment:  Oxygen Initial Assessment - 09/10/21 1006       Home Oxygen   Home Oxygen Device None    Sleep Oxygen Prescription CPAP   does not use   Home Exercise Oxygen Prescription None    Home Resting Oxygen Prescription None    Compliance with Home Oxygen Use No       Initial 6 min Walk   Oxygen Used None      Program Oxygen Prescription   Program Oxygen Prescription None      Intervention   Short Term Goals To learn and exhibit compliance with exercise, home and travel O2 prescription;To learn and understand importance of monitoring SPO2 with pulse oximeter and demonstrate accurate use of the pulse oximeter.;To learn and understand importance of maintaining oxygen saturations>88%;To learn and demonstrate proper pursed lip breathing techniques or other breathing techniques. ;To learn and demonstrate proper use of respiratory medications    Long  Term Goals Exhibits compliance with exercise, home  and travel O2 prescription;Verbalizes importance of monitoring SPO2 with pulse oximeter and return demonstration;Maintenance of O2 saturations>88%;Exhibits proper breathing techniques, such as pursed lip breathing or other method taught during program session;Compliance with respiratory medication;Demonstrates proper use of MDI's             Oxygen  Re-Evaluation:   Oxygen Discharge (Final Oxygen Re-Evaluation):   Initial Exercise Prescription:  Initial Exercise Prescription - 09/10/21 1100       Date of Initial Exercise RX and Referring Provider   Date 09/10/21    Referring Provider Loanne Drilling   [facility]   Expected Discharge Date 11/15/21      Recumbant Bike   Level 1    Minutes 15    METs 1      NuStep   Level 1    SPM 70    Minutes 15      Prescription Details   Frequency (times per week) 2    Duration Progress to 30 minutes of continuous aerobic without signs/symptoms of physical distress      Intensity   THRR 40-80% of Max Heartrate 60-120    Ratings of Perceived Exertion 11-13    Perceived Dyspnea 0-4      Progression   Progression Continue progressive overload as per policy without signs/symptoms or physical distress.      Resistance Training   Training Prescription Yes    Weight Red bands    Reps 10-15             Perform  Capillary Blood Glucose checks as needed.  Exercise Prescription Changes:   Exercise Comments:   Exercise Goals and Review:   Exercise Goals     Row Name 09/10/21 1128             Exercise Goals   Increase Physical Activity Yes       Intervention Provide advice, education, support and counseling about physical activity/exercise needs.;Develop an individualized exercise prescription for aerobic and resistive training based on initial evaluation findings, risk stratification, comorbidities and participant's personal goals.       Expected Outcomes Short Term: Attend rehab on a regular basis to increase amount of physical activity.;Long Term: Add in home exercise to make exercise part of routine and to increase amount of physical activity.;Long Term: Exercising regularly at least 3-5 days a week.       Increase Strength and Stamina Yes       Intervention Provide advice, education, support and counseling about physical activity/exercise needs.;Develop an individualized exercise prescription for aerobic and resistive training based on initial evaluation findings, risk stratification, comorbidities and participant's personal goals.       Expected Outcomes Short Term: Increase workloads from initial exercise prescription for resistance, speed, and METs.;Short Term: Perform resistance training exercises routinely during rehab and add in resistance training at home;Long Term: Improve cardiorespiratory fitness, muscular endurance and strength as measured by increased METs and functional capacity (6MWT)       Able to understand and use rate of perceived exertion (RPE) scale Yes       Intervention Provide education and explanation on how to use RPE scale       Expected Outcomes Short Term: Able to use RPE daily in rehab to express subjective intensity level;Long Term:  Able to use RPE to guide intensity level when exercising independently       Able to understand and use Dyspnea scale Yes        Intervention Provide education and explanation on how to use Dyspnea scale       Expected Outcomes Short Term: Able to use Dyspnea scale daily in rehab to express subjective sense of shortness of breath during exertion;Long Term: Able to use Dyspnea scale to guide intensity level when exercising independently       Knowledge  and understanding of Target Heart Rate Range (THRR) Yes       Intervention Provide education and explanation of THRR including how the numbers were predicted and where they are located for reference       Expected Outcomes Short Term: Able to state/look up THRR;Short Term: Able to use daily as guideline for intensity in rehab;Long Term: Able to use THRR to govern intensity when exercising independently       Understanding of Exercise Prescription Yes       Intervention Provide education, explanation, and written materials on patient's individual exercise prescription       Expected Outcomes Long Term: Able to explain home exercise prescription to exercise independently;Short Term: Able to explain program exercise prescription                Exercise Goals Re-Evaluation :   Discharge Exercise Prescription (Final Exercise Prescription Changes):   Nutrition:  Target Goals: Understanding of nutrition guidelines, daily intake of sodium 1500mg , cholesterol 200mg , calories 30% from fat and 7% or less from saturated fats, daily to have 5 or more servings of fruits and vegetables.  Biometrics:  Pre Biometrics - 09/10/21 1005       Pre Biometrics   Height 5\' 8"  (1.727 m)    Weight 111.4 kg    BMI (Calculated) 37.35    Grip Strength 28 kg              Nutrition Therapy Plan and Nutrition Goals:   Nutrition Assessments:  MEDIFICTS Score Key: ?70 Need to make dietary changes  40-70 Heart Healthy Diet ? 40 Therapeutic Level Cholesterol Diet   Picture Your Plate Scores: <37 Unhealthy dietary pattern with much room for improvement. 41-50 Dietary pattern  unlikely to meet recommendations for good health and room for improvement. 51-60 More healthful dietary pattern, with some room for improvement.  >60 Healthy dietary pattern, although there may be some specific behaviors that could be improved.    Nutrition Goals Re-Evaluation:   Nutrition Goals Discharge (Final Nutrition Goals Re-Evaluation):   Psychosocial: Target Goals: Acknowledge presence or absence of significant depression and/or stress, maximize coping skills, provide positive support system. Participant is able to verbalize types and ability to use techniques and skills needed for reducing stress and depression.  Initial Review & Psychosocial Screening:  Initial Psych Review & Screening - 09/10/21 1057       Initial Review   Current issues with --   Has Bipolar and is not taking meds due to side effects     Family Dynamics   Good Support System? Yes   wife assists him w/ ADL's   Comments No concerns at this time      Barriers   Psychosocial barriers to participate in program There are no identifiable barriers or psychosocial needs.      Screening Interventions   Interventions Encouraged to exercise    Expected Outcomes Short Term goal: Utilizing psychosocial counselor, staff and physician to assist with identification of specific Stressors or current issues interfering with healing process. Setting desired goal for each stressor or current issue identified.;Long Term Goal: Stressors or current issues are controlled or eliminated.             Quality of Life Scores:  Scores of 19 and below usually indicate a poorer quality of life in these areas.  A difference of  2-3 points is a clinically meaningful difference.  A difference of 2-3 points in the total score of the Quality  of Life Index has been associated with significant improvement in overall quality of life, self-image, physical symptoms, and general health in studies assessing change in quality of  life.  PHQ-9: Recent Review Flowsheet Data     Depression screen [facility] 2/9 09/10/2021 09/10/2021 04/13/2014   Decreased Interest 0 0 3   Down, Depressed, Hopeless 0 0 3   PHQ - 2 Score 0 0 6   Altered sleeping 1  - 3   Tired, decreased energy 0 - 2   Change in appetite 0 - 2   Feeling bad or failure about yourself  0 - 3   Trouble concentrating 1  - 0   Moving slowly or fidgety/restless 1  - 2   Suicidal thoughts 0 - 1   PHQ-9 Score 3 - 19   Difficult doing work/chores Somewhat difficult - -      Interpretation of Total Score  Total Score Depression Severity:  1-4 = Minimal depression, 5-9 = Mild depression, 10-14 = Moderate depression, 15-19 = Moderately severe depression, 20-27 = Severe depression   Psychosocial Evaluation and Intervention:  Psychosocial Evaluation - 09/10/21 1006       Psychosocial Evaluation & Interventions   Interventions --   Has psychiatrist   Continue Psychosocial Services  Follow up required by staff             Psychosocial Re-Evaluation:   Psychosocial Discharge (Final Psychosocial Re-Evaluation):   Education: Education Goals: Education classes will be provided on a weekly basis, covering required topics. Participant will state understanding/return demonstration of topics presented.  Learning Barriers/Preferences:  Learning Barriers/Preferences - 09/10/21 1008       Learning Barriers/Preferences   Learning Barriers None    Learning Preferences Audio;Computer/Internet;Group Instruction;Individual Instruction;Pictoral;Skilled Demonstration;Verbal Instruction;Video;Written Material             Education Topics: Risk Factor Reduction:  -Group instruction that is supported by a PowerPoint presentation. Instructor discusses the definition of a risk factor, different risk factors for pulmonary disease, and how the heart and lungs work together.     Nutrition for Pulmonary Patient:  -Group instruction provided by PowerPoint slides,  verbal discussion, and written materials to support subject matter. The instructor gives an explanation and review of healthy diet recommendations, which includes a discussion on weight management, recommendations for fruit and vegetable consumption, as well as protein, fluid, caffeine, fiber, sodium, sugar, and alcohol. Tips for eating when patients are short of breath are discussed.   Pursed Lip Breathing:  -Group instruction that is supported by demonstration and informational handouts. Instructor discusses the benefits of pursed lip and diaphragmatic breathing and detailed demonstration on how to preform both.     Oxygen Safety:  -Group instruction provided by PowerPoint, verbal discussion, and written material to support subject matter. There is an overview of "What is Oxygen" and "Why do we need it".  Instructor also reviews how to create a safe environment for oxygen use, the importance of using oxygen as prescribed, and the risks of noncompliance. There is a brief discussion on traveling with oxygen and resources the patient may utilize.   Oxygen Equipment:  -Group instruction provided by [facility] Staff utilizing handouts, written materials, and equipment demonstrations.   Signs and Symptoms:  -Group instruction provided by written material and verbal discussion to support subject matter. Warning signs and symptoms of infection, stroke, and heart attack are reviewed and when to call the physician/911 reinforced. Tips for preventing the spread of infection discussed.   Advanced  Directives:  -Group instruction provided by verbal instruction and written material to support subject matter. Instructor reviews Advanced Directive laws and proper instruction for filling out document.   Pulmonary Video:  -Group video education that reviews the importance of medication and oxygen compliance, exercise, good nutrition, pulmonary hygiene, and pursed lip and diaphragmatic breathing for the  pulmonary patient.   Exercise for the Pulmonary Patient:  -Group instruction that is supported by a PowerPoint presentation. Instructor discusses benefits of exercise, core components of exercise, frequency, duration, and intensity of an exercise routine, importance of utilizing pulse oximetry during exercise, safety while exercising, and options of places to exercise outside of rehab.     Pulmonary Medications:  -Verbally interactive group education provided by instructor with focus on inhaled medications and proper administration.   Anatomy and Physiology of the Respiratory System and Intimacy:  -Group instruction provided by PowerPoint, verbal discussion, and written material to support subject matter. Instructor reviews respiratory cycle and anatomical components of the respiratory system and their functions. Instructor also reviews differences in obstructive and restrictive respiratory diseases with examples of each. Intimacy, Sex, and Sexuality differences are reviewed with a discussion on how relationships can change when diagnosed with pulmonary disease. Common sexual concerns are reviewed.   MD DAY -A group question and answer session with a medical doctor that allows participants to ask questions that relate to their pulmonary disease state.   OTHER EDUCATION -Group or individual verbal, written, or video instructions that support the educational goals of the pulmonary rehab program.   Holiday Eating Survival Tips:  -Group instruction provided by PowerPoint slides, verbal discussion, and written materials to support subject matter. The instructor gives patients tips, tricks, and techniques to help them not only survive but enjoy the holidays despite the onslaught of food that accompanies the holidays.   Knowledge Questionnaire Score:  Knowledge Questionnaire Score - 09/10/21 1013       Knowledge Questionnaire Score   Pre Score 15/18             Core Components/Risk  Factors/Patient Goals at Admission:  Personal Goals and Risk Factors at Admission - 09/10/21 1009       Core Components/Risk Factors/Patient Goals on Admission   Improve shortness of breath with ADL's Yes    Intervention Provide education, individualized exercise plan and daily activity instruction to help decrease symptoms of SOB with activities of daily living.    Expected Outcomes Short Term: Improve cardiorespiratory fitness to achieve a reduction of symptoms when performing ADLs;Long Term: Be able to perform more ADLs without symptoms or delay the onset of symptoms             Core Components/Risk Factors/Patient Goals Review:    Core Components/Risk Factors/Patient Goals at Discharge (Final Review):    ITP Comments:   Comments:

## 2021-09-12 ENCOUNTER — Telehealth: Payer: Self-pay | Admitting: Gastroenterology

## 2021-09-12 NOTE — Telephone Encounter (Signed)
Pt. Requesting a phone call back he says his prescription was sent to [place] and it was supposed to be sent to the [place]. He said the [place] pays for all his medicines and they have been waiting on the prescription.

## 2021-09-14 MED ORDER — METAMUCIL SMOOTH TEXTURE 58.6 % PO POWD: 1.0000 | Freq: Every day | ORAL | 0 refills | Status: DC

## 2021-09-14 NOTE — Telephone Encounter (Signed)
[place] added and Metamucil Rx sent per pt request

## 2021-09-18 ENCOUNTER — Encounter: Payer: Self-pay | Admitting: Gastroenterology

## 2021-09-18 ENCOUNTER — Other Ambulatory Visit: Payer: Self-pay

## 2021-09-18 ENCOUNTER — Encounter (HOSPITAL_COMMUNITY)
Admission: RE | Admit: 2021-09-18 | Discharge: 2021-09-18 | Disposition: A | Discharge status: Home / Self Care | Payer: No Typology Code available for payment source | Source: Ambulatory Visit | Attending: Pulmonary Disease | Admitting: Pulmonary Disease

## 2021-09-18 ENCOUNTER — Ambulatory Visit (INDEPENDENT_AMBULATORY_CARE_PROVIDER_SITE_OTHER): Payer: No Typology Code available for payment source | Admitting: Gastroenterology

## 2021-09-18 VITALS — Wt 244.3 lb

## 2021-09-18 VITALS — BP 134/72 | HR 60 | Temp 98.5°F | Wt 243.4 lb

## 2021-09-18 DIAGNOSIS — K219 Gastro-esophageal reflux disease without esophagitis: Secondary | ICD-10-CM

## 2021-09-18 DIAGNOSIS — R195 Other fecal abnormalities: Secondary | ICD-10-CM | POA: Diagnosis not present

## 2021-09-18 DIAGNOSIS — J449 Chronic obstructive pulmonary disease, unspecified: Secondary | ICD-10-CM | POA: Diagnosis present

## 2021-09-18 LAB — GLUCOSE, CAPILLARY
Glucose-Capillary: 176 mg/dL — ABNORMAL HIGH (ref 70–99)
Glucose-Capillary: 118 mg/dL — ABNORMAL HIGH (ref 70–99)

## 2021-09-18 MED ORDER — FAMOTIDINE 20 MG PO TABS: 20.0000 mg | ORAL_TABLET | Freq: Every day | ORAL | 0 refills | Status: DC

## 2021-09-18 MED ORDER — METAMUCIL SMOOTH TEXTURE 58.6 % PO POWD: 1.0000 | Freq: Every day | ORAL | 0 refills | Status: AC

## 2021-09-18 NOTE — Progress Notes (Signed)
Daily Session Note  Patient Details  Name: Jose Patrick MRN: [id number] Date of Birth: October 21, 1951 Referring Provider:   April Manson Pul[facility] Rehab Walk Test from 09/10/2021 in [place]  Referring Provider Loanne Drilling  [[facility]]       Encounter Date: 09/18/2021  Check In:  Session Check In - 09/18/21 1048       Check-In   Supervising physician immediately available to respond to emergencies [facility] immediately available    Physician(s) Dr. Dwyane Dee    Location [facility]    Staff Present Rosebud Poles, RN, Quentin Ore, MS, ACSM-CEP, Exercise Physiologist;Simran Bomkamp Ysidro Evert, RN    Virtual Visit No    Medication changes reported     No    Fall or balance concerns reported    No    Tobacco Cessation No Change    Warm-up and Cool-down Performed as group-led instruction    Resistance Training Performed Yes    VAD Patient? No    PAD/SET Patient? No      Pain Assessment   Currently in Pain? No/denies    Multiple Pain Sites No             Capillary Blood Glucose: No results found for this or any previous visit (from the past 24 hour(s)).  POCT Glucose - 09/18/21 1159       POCT Blood Glucose   Pre-Exercise 176 mg/dL    Post-Exercise 118 mg/dL             Exercise Prescription Changes - 09/18/21 1200       Response to Exercise   Blood Pressure (Admit) 110/60    Blood Pressure (Exercise) 126/68    Heart Rate (Admit) 1.67 bpm    Heart Rate (Exercise) 63 bpm    Heart Rate (Exit) 89 bpm    Oxygen Saturation (Admit) 64 %    Oxygen Saturation (Exercise) 95 %    Oxygen Saturation (Exit) 93 %    Rating of Perceived Exertion (Exercise) 96    Perceived Dyspnea (Exercise) 12    Symptoms 1    Duration Continue with 30 min of aerobic exercise without signs/symptoms of physical distress.    Intensity Other (comment)      Progression   Progression Continue to progress workloads to maintain intensity without  signs/symptoms of physical distress.      Resistance Training   Training Prescription Yes    Weight Red bands    Reps 10-15    Time 10 Minutes      Recumbant Bike   Level 2.4    Minutes 15    METs 2.2      NuStep   Level 1    SPM 70    Minutes 15    METs 1.8             Social History   Tobacco Use  Smoking Status Former   Packs/day: 1.50   Years: 41.00   Pack years: 61.50   Types: Cigarettes   Quit date: 2013   Years since quitting: 9.8  Smokeless Tobacco Never    Goals Met:  Exercise tolerated well No report of concerns or symptoms today Strength training completed today  Goals Unmet:  Not Applicable  Comments: Service time is from 1010 to [employer] completed his first day of exercise.   Dr. Rodman Pickle is Medical Director for Pulmonary Rehab at [facility].

## 2021-09-18 NOTE — Progress Notes (Signed)
[name], MD [address]  [place]  [place], [place] [address]  Main: [phone]  Fax: [phone]   Primary Care Physician: [facility]  Chief complaint: Reflux  HPI: Jose Patrick is a 70 y.o. male here for follow-up.  He states he thinks he is on a brown and white capsule that he thinks is for reflux.  However, per review of his current and previous medications did not show any PPI or H2 RA therapy currently.  He reports having improvement in symptoms of reflux since losing weight.  Reports having heartburn only when he is laying down.  He has to sit back up as he has burning sensation in his chest, and that eventually is able to lay back down and go back to sleep.  No further dysphagia.  No nausea or vomiting.  Also reports globus sensation.  Patient has completed EGD, upper GI series and colonoscopy recently prior  ROS: All ROS reviewed and negative except as per HPI   Past Medical History:  Diagnosis Date   ALLERGIC RHINITIS 10/08/2007   Qualifier: Diagnosis of  By: Jenny Reichmann MD, Hunt Oris    BENIGN PROSTATIC HYPERTROPHY 09/28/2010   Qualifier: Diagnosis of  By: Jenny Reichmann MD, Hunt Oris    Bipolar affective disorder ([place]) 09/30/2012   COLONIC POLYPS, HX OF 10/08/2007   Qualifier: Diagnosis of  By: Jenny Reichmann MD, Hunt Oris    Complication of anesthesia    sleeps awhile afterwards per patient   CONSTIPATION, CHRONIC, HX OF 08/10/2007   Qualifier: Diagnosis of  By: [place], [place], TYPE II 10/08/2007   Qualifier: Diagnosis of  By: Jenny Reichmann MD, Hunt Oris    ERECTILE DYSFUNCTION 10/08/2007   Qualifier: Diagnosis of  By: Jenny Reichmann MD, Hunt Oris    HYPERLIPIDEMIA 10/08/2007   Qualifier: Diagnosis of  By: Jenny Reichmann MD, Hunt Oris    HYPERTENSION 03/20/2009   Qualifier: Diagnosis of  By: Jenny Reichmann MD, Hunt Oris    PTSD (post-traumatic stress disorder) 09/30/2012   SPINAL STENOSIS, LUMBAR 10/08/2007   Per Dr Arnoldo Morale - [facility]   Qualifier:  Diagnosis of  By: Jenny Reichmann MD, Hunt Oris      Past Surgical History:  Procedure Laterality Date   COLONOSCOPY WITH PROPOFOL N/A 04/09/2021   Procedure: COLONOSCOPY WITH PROPOFOL;  Surgeon: Virgel Manifold, MD;  Location: [facility] END[facility]COPY;  Service: Endoscopy;  Laterality: N/A;   cyst off right buttock     ESOPHAGOGASTRODUODENOSCOPY (EGD) WITH PROPOFOL N/A 04/09/2021   Procedure: ESOPHAGOGASTRODUODENOSCOPY (EGD) WITH PROPOFOL;  Surgeon: Virgel Manifold, MD;  Location: [facility] ENDOSCOPY;  Service: Endoscopy;  Laterality: N/A;   PREPATELLAR BURSA EXCISION     removal right   s/p bilat hydrocelectomy  2008   x 2   TONSILLECTOMY      Prior to Admission medications   Medication Sig Start Date End Date Taking? Authorizing Provider  aspirin 81 MG chewable tablet Chew 1 tablet (81 mg total) by mouth daily. 12/19/11  Yes Biagio Borg, MD  Cholecalciferol (VITAMIN D) 2000 UNITS CAPS Take 1 capsule by mouth 2 (two) times daily.    Yes [provider]  clonazePAM (KLONOPIN) 0.5 MG tablet Take 0.5 mg by mouth 2 (two) times daily. 08/06/13  Yes [provider]  famotidine (PEPCID) 20 MG tablet Take 1 tablet (20 mg total) by mouth at bedtime. 09/18/21 10/18/21 Yes Virgel Manifold, MD  gabapentin (NEURONTIN) 800 MG tablet Take 800 mg by  mouth 2 (two) times daily.   Yes [provider]  insulin glargine (LANTUS) 100 UNIT/ML injection 20 Units.   Yes [provider]  JARDIANCE 10 MG TABS tablet Take 10 mg by mouth daily. 02/23/21  Yes [provider]  Pyridoxine HCl (VITAMIN B6 PO) Take 1 tablet by mouth daily.    Yes [provider]  QUEtiapine (SEROQUEL) 300 MG tablet Take 300 mg by mouth at bedtime.   Yes [provider]  Semaglutide,0.25 or 0.5MG/DOS, (OZEMPIC, 0.25 OR 0.5 MG/DOSE,) 2 MG/1.5ML SOPN Inject 0.5 mg into the skin once a week.   Yes [provider]  sitaGLIPtin (JANUVIA) 100 MG tablet Take 100 mg by mouth daily.   Yes  [provider]  metFORMIN (GLUCOPHAGE) 1000 MG tablet Take 1,000 mg by mouth 2 (two) times daily. 09/14/21   [provider]  psyllium (METAMUCIL SMOOTH TEXTURE) 58.6 % powder Take 1 packet by mouth daily. 09/18/21 12/17/21  Virgel Manifold, MD    Family History  Problem Relation Age of Onset   Cancer Mother    Ulcers Mother        Stomach   Depression Mother    Anxiety disorder Mother    Allergies Sister      Social History   Tobacco Use   Smoking status: Former    Packs/day: 1.50    Years: 41.00    Pack years: 61.50    Types: Cigarettes    Quit date: 2013    Years since quitting: 9.8   Smokeless tobacco: Never  Substance Use Topics   Alcohol use: Yes   Drug use: No    Allergies as of 09/18/2021 - Review Complete 09/18/2021  Allergen Reaction Noted   Chlorzoxazone Swelling 08/15/2011   Tramadol Other (See Comments) 03/10/2017    Physical Examination:  Constitutional: General:   Alert,  Well-developed, well-nourished, pleasant and cooperative in NAD BP 134/72   Pulse 60   Temp 98.5 F (36.9 C) (Oral)   Wt 243 lb 6.4 oz (110.4 kg)   BMI 37.01 kg/m   Respiratory: Normal respiratory effort  Gastrointestinal:  Soft, non-tender and non-distended without masses, hepatosplenomegaly or hernias noted.  No guarding or rebound tenderness.     Cardiac: No clubbing or edema.  No cyanosis. Normal posterior tibial pedal pulses noted.  Psych:  Alert and cooperative. Normal mood and affect.  Musculoskeletal:  Normal gait. Head normocephalic, atraumatic. Symmetrical without gross deformities. 5/5 Lower extremity strength bilaterally.  Skin: Warm. Intact without significant lesions or rashes. No jaundice.  Neck: Supple, trachea midline  Lymph: No cervical lymphadenopathy  Psych:  Alert and oriented x3, Alert and cooperative. Normal mood and affect.  Labs: CMP     Component Value Date/Time   NA 141 05/26/2021 0416   K 4.1 05/26/2021 0416   CL  103 05/26/2021 0416   CO2 30 05/26/2021 0416   GLUCOSE 102 (H) 05/26/2021 0416   BUN 19 05/26/2021 0416   CREATININE 1.16 05/26/2021 0416   CALCIUM 10.0 05/26/2021 0416   PROT 7.4 05/26/2021 0416   ALBUMIN 4.2 05/26/2021 0416   AST 31 05/26/2021 0416   ALT 37 05/26/2021 0416   ALKPHOS 45 05/26/2021 0416   BILITOT 0.4 05/26/2021 0416   GFRNONAA >60 05/26/2021 0416   GFRAA  02/23/2009 1513    >60        The eGFR has been calculated using the MDRD equation. This calculation has not been validated in all clinical situations. eGFR's  persistently <60 mL/min signify possible Chronic Kidney Disease.   Lab Results  Component Value Date   WBC 6.6 05/26/2021   HGB 13.7 05/26/2021   HCT 44.0 05/26/2021   MCV 83.5 05/26/2021   PLT 218 05/26/2021    Imaging Studies:   Assessment and Plan:   Jose Patrick is a 70 y.o. y/o male here for follow-up of reflux  Symptoms have improved since losing weight However, symptoms of occur when patient was laying down.  He is not on any PPI or H2 RA therapy Given symptoms only when laying down, start Pepcid at bedtime for 30 days  Patient educated on acid reflux lifestyle modification, including using a bed wedge, not eating 3 hrs before bedtime, diet modifications  He also describes globus sensation, which may improve with above measures as well. EGD completed  No further dysphagia. If dysphagia reoccurs consider swallow study vis manometry  Patient states he has not started taking Metamucil.  He only had 1 or 2 bowel movements both in the morning.  And he described ECGs.  Does not have any further bowel sounds throughout the day.  Therefore, he is not having any significant diarrhea.  Metamucil will help bulk stool.  This was sent to the pharmacy but patient states that since he would not like to pick it up over-the-counter and would [facility] for [facility] to [facility] it, he will be getting it from the [place] soon   Dr [name]

## 2021-09-20 ENCOUNTER — Other Ambulatory Visit: Payer: Self-pay

## 2021-09-20 ENCOUNTER — Encounter (HOSPITAL_COMMUNITY)
Admission: RE | Admit: 2021-09-20 | Discharge: 2021-09-20 | Disposition: A | Discharge status: Home / Self Care | Payer: No Typology Code available for payment source | Source: Ambulatory Visit | Attending: Pulmonary Disease | Admitting: Pulmonary Disease

## 2021-09-20 DIAGNOSIS — J449 Chronic obstructive pulmonary disease, unspecified: Secondary | ICD-10-CM

## 2021-09-20 LAB — GLUCOSE, CAPILLARY
Glucose-Capillary: 154 mg/dL — ABNORMAL HIGH (ref 70–99)
Glucose-Capillary: 91 mg/dL (ref 70–99)

## 2021-09-20 NOTE — Progress Notes (Signed)
Daily Session Note  Patient Details  Name: Jose Patrick MRN: [id number] Date of Birth: [birth year] Referring Provider:   April Manson Pulmonary Rehab Walk Test from 09/10/2021 in [place]  Referring Provider Loanne Drilling  [[facility]]       Encounter Date: 09/20/2021  Check In:  Session Check In - 09/20/21 1126       Check-In   Supervising physician immediately available to respond to emergencies [facility] immediately available    Physician(s) Dr. Frederic Jericho    Location [facility]    Staff Present Rosebud Poles, RN, Quentin Ore, MS, ACSM-CEP, Exercise Physiologist    Virtual Visit No    Medication changes reported     No    Fall or balance concerns reported    No    Tobacco Cessation No Change    Warm-up and Cool-down Performed as group-led instruction    Resistance Training Performed Yes    VAD Patient? No    PAD/SET Patient? No      Pain Assessment   Currently in Pain? No/denies    Multiple Pain Sites No             Capillary Blood Glucose: No results found for this or any previous visit (from the past 24 hour(s)).    Social History   Tobacco Use  Smoking Status Former   Packs/day: 1.50   Years: 41.00   Pack years: 61.50   Types: Cigarettes   Quit date: 2013   Years since quitting: 9.8  Smokeless Tobacco Never    Goals Met:  Proper associated with RPD/PD & O2 Sat Exercise tolerated well No report of concerns or symptoms today Strength training completed today  Goals Unmet:  Not Applicable  Comments: Service time is from 1015 to 1133.    Dr. Rodman Pickle is Medical Director for Pulmonary Rehab at [facility].

## 2021-09-24 NOTE — Progress Notes (Signed)
Pulmonary Individual Treatment Plan  Patient Details  Name: Jose Patrick MRN: [id number] Date of Birth: 1951-08-03 Referring Provider:   April Manson Pulmonary Rehab Walk Test from 09/10/2021 in [place]  Referring Provider Loanne Drilling  [[facility]]       Initial Encounter Date:  Flowsheet Row Pulmonary Rehab Walk Test from 09/10/2021 in [place]  Date 09/10/21       Visit Diagnosis: Stage 2 moderate COPD by GOLD classification ([place])  Patient's Home Medications on Admission:   Current Outpatient Medications:    aspirin 81 MG chewable tablet, Chew 1 tablet (81 mg total) by mouth daily., Disp: 90 tablet, Rfl: 3   Cholecalciferol (VITAMIN D) 2000 UNITS CAPS, Take 1 capsule by mouth 2 (two) times daily. , Disp: , Rfl:    clonazePAM (KLONOPIN) 0.5 MG tablet, Take 0.5 mg by mouth 2 (two) times daily., Disp: , Rfl:    famotidine (PEPCID) 20 MG tablet, Take 1 tablet (20 mg total) by mouth at bedtime., Disp: 30 tablet, Rfl: 0   gabapentin (NEURONTIN) 800 MG tablet, Take 800 mg by mouth 2 (two) times daily., Disp: , Rfl:    insulin glargine (LANTUS) 100 UNIT/ML injection, 20 Units., Disp: , Rfl:    JARDIANCE 10 MG TABS tablet, Take 10 mg by mouth daily., Disp: , Rfl:    metFORMIN (GLUCOPHAGE) 1000 MG tablet, Take 1,000 mg by mouth 2 (two) times daily., Disp: , Rfl:    psyllium (METAMUCIL SMOOTH TEXTURE) 58.6 % powder, Take 1 packet by mouth daily., Disp: 90 packet, Rfl: 0   Pyridoxine HCl (VITAMIN B6 PO), Take 1 tablet by mouth daily. , Disp: , Rfl:    QUEtiapine (SEROQUEL) 300 MG tablet, Take 300 mg by mouth at bedtime., Disp: , Rfl:    Semaglutide,0.25 or 0.5MG/DOS, (OZEMPIC, 0.25 OR 0.5 MG/DOSE,) 2 MG/1.5ML SOPN, Inject 0.5 mg into the skin once a week., Disp: , Rfl:    sitaGLIPtin (JANUVIA) 100 MG tablet, Take 100 mg by mouth daily., Disp: , Rfl:   Past Medical History: Past Medical History:  Diagnosis Date   ALLERGIC  RHINITIS 10/08/2007   Qualifier: Diagnosis of  By: Jenny Reichmann MD, Hunt Oris    BENIGN PROSTATIC HYPERTROPHY 09/28/2010   Qualifier: Diagnosis of  By: Jenny Reichmann MD, Hunt Oris    Bipolar affective disorder ([place]) 09/30/2012   COLONIC POLYPS, HX OF 10/08/2007   Qualifier: Diagnosis of  By: Jenny Reichmann MD, Hunt Oris    Complication of anesthesia    sleeps awhile afterwards per patient   CONSTIPATION, CHRONIC, HX OF 08/10/2007   Qualifier: Diagnosis of  By: [place], [place], TYPE II 10/08/2007   Qualifier: Diagnosis of  By: Jenny Reichmann MD, Hunt Oris    ERECTILE DYSFUNCTION 10/08/2007   Qualifier: Diagnosis of  By: Jenny Reichmann MD, Hunt Oris    HYPERLIPIDEMIA 10/08/2007   Qualifier: Diagnosis of  By: Jenny Reichmann MD, Hunt Oris    HYPERTENSION 03/20/2009   Qualifier: Diagnosis of  By: Jenny Reichmann MD, Hunt Oris    PTSD (post-traumatic stress disorder) 09/30/2012   SPINAL STENOSIS, LUMBAR 10/08/2007   Per Dr Arnoldo Morale - [facility]   Qualifier: Diagnosis of  By: Jenny Reichmann MD, Hunt Oris      Tobacco Use: Social History   Tobacco Use  Smoking Status Former   Packs/day: 1.50   Years: 41.00   Pack years: 61.50   Types: Cigarettes   Quit date: 2013  Years since quitting: 9.8  Smokeless Tobacco Never    Labs: Recent Review Flowsheet Data     Labs for ITP Cardiac and Pulmonary Rehab Latest Ref Rng & Units 07/03/2009 05/10/2010 09/21/2010 04/02/2011 09/30/2012   Cholestrol 0 - 200 mg/dL 121 174 198 173 142   LDLCALC 0 - 99 mg/dL 70 108(H) 138(H) 114(H) 85   LDLDIRECT mg/dL - - - - -   HDL >39.00 mg/dL 38.70(L) 49.10 42.20 44.80 40.90   Trlycerides 0.0 - 149.0 mg/dL 63.0 85.0 90.0 71.0 83.0   Hemoglobin A1c 4.6 - 6.5 % 7.1(H) 7.9(H) - 6.6(H) 7.6(H)       Capillary Blood Glucose: Lab Results  Component Value Date   GLUCAP 91 09/20/2021   GLUCAP 154 (H) 09/20/2021   GLUCAP 118 (H) 09/18/2021   GLUCAP 176 (H) 09/18/2021   GLUCAP 97 04/09/2021    POCT Glucose     Row Name 09/18/21 1159             POCT  Blood Glucose   Pre-Exercise 176 mg/dL       Post-Exercise 118 mg/dL                Pulmonary Assessment Scores:  Pulmonary Assessment Scores     Row Name 09/10/21 1014         ADL UCSD   ADL Phase Entry     SOB Score total 58       CAT Score   CAT Score 23       mMRC Score   mMRC Score 4             UCSD: Self-administered rating of dyspnea associated with activities of daily living (ADLs) 6-point scale (0 = "not at all" to 5 = "maximal or unable to do because of breathlessness")  Scoring Scores range from 0 to 120.  Minimally important difference is 5 units  CAT: CAT can identify the health impairment of COPD patients and is better correlated with disease progression.  CAT has a scoring range of zero to 40. The CAT score is classified into four groups of low (less than 10), medium (10 - 20), high (21-30) and very high (31-40) based on the impact level of disease on health status. A CAT score over 10 suggests significant symptoms.  A worsening CAT score could be explained by an exacerbation, poor medication adherence, poor inhaler technique, or progression of COPD or comorbid conditions.  CAT MCID is 2 points  mMRC: mMRC (Modified Medical Research Council) Dyspnea Scale is used to assess the degree of baseline functional disability in patients of respiratory disease due to dyspnea. No minimal important difference is established. A decrease in score of 1 point or greater is considered a positive change.   Pulmonary Function Assessment:   Exercise Target Goals: Exercise Program Goal: Individual exercise prescription set using results from initial 6 min walk test and THRR while considering  patient's activity barriers and safety.   Exercise Prescription Goal: Initial exercise prescription builds to 30-45 minutes a day of aerobic activity, 2-3 days per week.  Home exercise guidelines will be given to patient during program as part of exercise prescription that the  participant will acknowledge.  Activity Barriers & Risk Stratification:  Activity Barriers & Cardiac Risk Stratification - 09/10/21 1003       Activity Barriers & Cardiac Risk Stratification   Activity Barriers Neck/Spine Problems;Back Problems;Deconditioning;Muscular Weakness;Shortness of Breath;Balance Concerns;History of Falls;Assistive Device;Arthritis    Cardiac Risk Stratification High  6 Minute Walk:  6 Minute Walk     Row Name 09/10/21 1121         6 Minute Walk   Phase Initial     Distance 628 feet     Walk Time 6 minutes     # of Rest Breaks 0     MPH 1.19     METS 0.96     RPE 11     Perceived Dyspnea  1     VO2 Peak 3.37     Symptoms No     Resting HR 74 bpm     Resting BP 130/60     Resting Oxygen Saturation  97 %     Exercise Oxygen Saturation  during 6 min walk 96 %     Max Ex. HR 79 bpm     Max Ex. BP 132/60     2 Minute Post BP 120/58       Interval HR   1 Minute HR 77     2 Minute HR 83     3 Minute HR 78     4 Minute HR 78     5 Minute HR 79     6 Minute HR 78     2 Minute Post HR 63     Interval Heart Rate? Yes       Interval Oxygen   Interval Oxygen? Yes     1 Minute Oxygen Saturation % 97 %     1 Minute Liters of Oxygen 0 L     2 Minute Oxygen Saturation % 96 %     2 Minute Liters of Oxygen 0 L     3 Minute Oxygen Saturation % 97 %     3 Minute Liters of Oxygen 0 L     4 Minute Oxygen Saturation % 96 %     4 Minute Liters of Oxygen 0 L     5 Minute Oxygen Saturation % 96 %     5 Minute Liters of Oxygen 0 L     6 Minute Oxygen Saturation % 96 %     6 Minute Liters of Oxygen 0 L     2 Minute Post Oxygen Saturation % 96 %     2 Minute Post Liters of Oxygen 0 L              Oxygen Initial Assessment:  Oxygen Initial Assessment - 09/10/21 1006       Home Oxygen   Home Oxygen Device None    Sleep Oxygen Prescription CPAP   does not use   Home Exercise Oxygen Prescription None    Home Resting Oxygen  Prescription None    Compliance with Home Oxygen Use No      Initial 6 min Walk   Oxygen Used None      Program Oxygen Prescription   Program Oxygen Prescription None      Intervention   Short Term Goals To learn and exhibit compliance with exercise, home and travel O2 prescription;To learn and understand importance of monitoring SPO2 with pulse oximeter and demonstrate accurate use of the pulse oximeter.;To learn and understand importance of maintaining oxygen saturations>88%;To learn and demonstrate proper pursed lip breathing techniques or other breathing techniques. ;To learn and demonstrate proper use of respiratory medications    Long  Term Goals Exhibits compliance with exercise, home  and travel O2 prescription;Verbalizes importance of monitoring SPO2 with pulse oximeter and return demonstration;Maintenance of O2 saturations>88%;Exhibits  proper breathing techniques, such as pursed lip breathing or other method taught during program session;Compliance with respiratory medication;Demonstrates proper use of MDI's             Oxygen Re-Evaluation:  Oxygen Re-Evaluation     Row Name 09/17/21 0941             Program Oxygen Prescription   Program Oxygen Prescription None         Home Oxygen   Home Oxygen Device None       Sleep Oxygen Prescription CPAP  does not use       Home Exercise Oxygen Prescription None       Home Resting Oxygen Prescription None       Compliance with Home Oxygen Use No         Goals/Expected Outcomes   Short Term Goals To learn and exhibit compliance with exercise, home and travel O2 prescription;To learn and understand importance of monitoring SPO2 with pulse oximeter and demonstrate accurate use of the pulse oximeter.;To learn and understand importance of maintaining oxygen saturations>88%;To learn and demonstrate proper pursed lip breathing techniques or other breathing techniques. ;To learn and demonstrate proper use of respiratory medications        Long  Term Goals Exhibits compliance with exercise, home  and travel O2 prescription;Verbalizes importance of monitoring SPO2 with pulse oximeter and return demonstration;Maintenance of O2 saturations>88%;Exhibits proper breathing techniques, such as pursed lip breathing or other method taught during program session;Compliance with respiratory medication;Demonstrates proper use of MDI's                Oxygen Discharge (Final Oxygen Re-Evaluation):  Oxygen Re-Evaluation - 09/17/21 0941       Program Oxygen Prescription   Program Oxygen Prescription None      Home Oxygen   Home Oxygen Device None    Sleep Oxygen Prescription CPAP   does not use   Home Exercise Oxygen Prescription None    Home Resting Oxygen Prescription None    Compliance with Home Oxygen Use No      Goals/Expected Outcomes   Short Term Goals To learn and exhibit compliance with exercise, home and travel O2 prescription;To learn and understand importance of monitoring SPO2 with pulse oximeter and demonstrate accurate use of the pulse oximeter.;To learn and understand importance of maintaining oxygen saturations>88%;To learn and demonstrate proper pursed lip breathing techniques or other breathing techniques. ;To learn and demonstrate proper use of respiratory medications    Long  Term Goals Exhibits compliance with exercise, home  and travel O2 prescription;Verbalizes importance of monitoring SPO2 with pulse oximeter and return demonstration;Maintenance of O2 saturations>88%;Exhibits proper breathing techniques, such as pursed lip breathing or other method taught during program session;Compliance with respiratory medication;Demonstrates proper use of MDI's             Initial Exercise Prescription:  Initial Exercise Prescription - 09/10/21 1100       Date of Initial Exercise RX and Referring Provider   Date 09/10/21    Referring Provider Loanne Drilling   [facility]   Expected Discharge Date 11/15/21      Recumbant Bike    Level 1    Minutes 15    METs 1      NuStep   Level 1    SPM 70    Minutes 15      Prescription Details   Frequency (times per week) 2    Duration Progress to 30 minutes of continuous aerobic without signs/symptoms of  physical distress      Intensity   THRR 40-80% of Max Heartrate 60-120    Ratings of Perceived Exertion 11-13    Perceived Dyspnea 0-4      Progression   Progression Continue progressive overload as per policy without signs/symptoms or physical distress.      Resistance Training   Training Prescription Yes    Weight Red bands    Reps 10-15             Perform Capillary Blood Glucose checks as needed.  Exercise Prescription Changes:   Exercise Prescription Changes     Row Name 09/18/21 1200             Response to Exercise   Blood Pressure (Admit) 110/60       Blood Pressure (Exercise) 126/68       Heart Rate (Admit) 1.67 bpm       Heart Rate (Exercise) 63 bpm       Heart Rate (Exit) 89 bpm       Oxygen Saturation (Admit) 64 %       Oxygen Saturation (Exercise) 95 %       Oxygen Saturation (Exit) 93 %       Rating of Perceived Exertion (Exercise) 96       Perceived Dyspnea (Exercise) 12       Symptoms 1       Duration Continue with 30 min of aerobic exercise without signs/symptoms of physical distress.       Intensity Other (comment)         Progression   Progression Continue to progress workloads to maintain intensity without signs/symptoms of physical distress.         Resistance Training   Training Prescription Yes       Weight Red bands       Reps 10-15       Time 10 Minutes         Recumbant Bike   Level 2.4       Minutes 15       METs 2.2         NuStep   Level 1       SPM 70       Minutes 15       METs 1.8                Exercise Comments:   Exercise Comments     Row Name 09/18/21 1217           Exercise Comments Pt completed first day of exercise. Denise exercised 15 min on NuStep and recumbent bike. He  tolerated both exercise modes well. K[place]ler exercised at a higher level than his initial EXRX on the recumbent bike. He performed the warm up and cool down standing. He had slight hip pain during the cool down and sat down for 1 min.                Exercise Goals and Review:   Exercise Goals     Row Name 09/10/21 1128 09/18/21 1503           Exercise Goals   Increase Physical Activity Yes Yes      Intervention Provide advice, education, support and counseling about physical activity/exercise needs.;Develop an individualized exercise prescription for aerobic and resistive training based on initial evaluation findings, risk stratification, comorbidities and participant's personal goals. Provide advice, education, support and counseling about physical activity/exercise needs.;Develop an individualized exercise prescription  for aerobic and resistive training based on initial evaluation findings, risk stratification, comorbidities and participant's personal goals.      Expected Outcomes Short Term: Attend rehab on a regular basis to increase amount of physical activity.;Long Term: Add in home exercise to make exercise part of routine and to increase amount of physical activity.;Long Term: Exercising regularly at least 3-5 days a week. Short Term: Attend rehab on a regular basis to increase amount of physical activity.;Long Term: Add in home exercise to make exercise part of routine and to increase amount of physical activity.;Long Term: Exercising regularly at least 3-5 days a week.      Increase Strength and Stamina Yes Yes      Intervention Provide advice, education, support and counseling about physical activity/exercise needs.;Develop an individualized exercise prescription for aerobic and resistive training based on initial evaluation findings, risk stratification, comorbidities and participant's personal goals. Provide advice, education, support and counseling about physical activity/exercise  needs.;Develop an individualized exercise prescription for aerobic and resistive training based on initial evaluation findings, risk stratification, comorbidities and participant's personal goals.      Expected Outcomes Short Term: Increase workloads from initial exercise prescription for resistance, speed, and METs.;Short Term: Perform resistance training exercises routinely during rehab and add in resistance training at home;Long Term: Improve cardiorespiratory fitness, muscular endurance and strength as measured by increased METs and functional capacity (6MWT) Short Term: Increase workloads from initial exercise prescription for resistance, speed, and METs.;Short Term: Perform resistance training exercises routinely during rehab and add in resistance training at home;Long Term: Improve cardiorespiratory fitness, muscular endurance and strength as measured by increased METs and functional capacity (6MWT)      Able to understand and use rate of perceived exertion (RPE) scale Yes Yes      Intervention Provide education and explanation on how to use RPE scale Provide education and explanation on how to use RPE scale      Expected Outcomes Short Term: Able to use RPE daily in rehab to express subjective intensity level;Long Term:  Able to use RPE to guide intensity level when exercising independently Short Term: Able to use RPE daily in rehab to express subjective intensity level;Long Term:  Able to use RPE to guide intensity level when exercising independently      Able to understand and use Dyspnea scale Yes Yes      Intervention Provide education and explanation on how to use Dyspnea scale Provide education and explanation on how to use Dyspnea scale      Expected Outcomes Short Term: Able to use Dyspnea scale daily in rehab to express subjective sense of shortness of breath during exertion;Long Term: Able to use Dyspnea scale to guide intensity level when exercising independently Short Term: Able to use  Dyspnea scale daily in rehab to express subjective sense of shortness of breath during exertion;Long Term: Able to use Dyspnea scale to guide intensity level when exercising independently      Knowledge and understanding of Target Heart Rate Range (THRR) Yes Yes      Intervention Provide education and explanation of THRR including how the numbers were predicted and where they are located for reference Provide education and explanation of THRR including how the numbers were predicted and where they are located for reference      Expected Outcomes Short Term: Able to state/look up THRR;Short Term: Able to use daily as guideline for intensity in rehab;Long Term: Able to use THRR to govern intensity when exercising independently Short Term:  Able to state/look up THRR;Short Term: Able to use daily as guideline for intensity in rehab;Long Term: Able to use THRR to govern intensity when exercising independently      Understanding of Exercise Prescription Yes Yes      Intervention Provide education, explanation, and written materials on patient's individual exercise prescription Provide education, explanation, and written materials on patient's individual exercise prescription      Expected Outcomes Long Term: Able to explain home exercise prescription to exercise independently;Short Term: Able to explain program exercise prescription Long Term: Able to explain home exercise prescription to exercise independently;Short Term: Able to explain program exercise prescription               Exercise Goals Re-Evaluation :  Exercise Goals Re-Evaluation     [place] Name 09/17/21 0939 09/18/21 1504           Exercise Goal Re-Evaluation   Exercise Goals Review Increase Physical Activity;Increase Strength and Stamina;Able to understand and use rate of perceived exertion (RPE) scale;Able to understand and use Dyspnea scale;Knowledge and understanding of Target Heart Rate Range (THRR);Understanding of Exercise  Prescription Increase Physical Activity;Increase Strength and Stamina;Able to understand and use rate of perceived exertion (RPE) scale;Able to understand and use Dyspnea scale;Knowledge and understanding of Target Heart Rate Range (THRR);Understanding of Exercise Prescription      Comments Izzac is scheduled to begin exercising this week. Will monitor and progress as able. Khaiden has completed 1 exercise session. He exercised for 15 min on the NuStep and recumbent bike. He increased his level on the recumbent bike from initial EXRX. He tolerated both exercise modes well and performed the warm up and cool down standing. It is too soon to note any discernable progressions. Will continue to monitor and progress as able.      Expected Outcomes Through exercise at rehab and home the patient will decrease shortness of breath with daily activities and feel confident in carrying out an exercise regimen at home Through exercise at rehab and home the patient will decrease shortness of breath with daily activities and feel confident in carrying out an exercise regimen at home               Discharge Exercise Prescription (Final Exercise Prescription Changes):  Exercise Prescription Changes - 09/18/21 1200       Response to Exercise   Blood Pressure (Admit) 110/60    Blood Pressure (Exercise) 126/68    Heart Rate (Admit) 1.67 bpm    Heart Rate (Exercise) 63 bpm    Heart Rate (Exit) 89 bpm    Oxygen Saturation (Admit) 64 %    Oxygen Saturation (Exercise) 95 %    Oxygen Saturation (Exit) 93 %    Rating of Perceived Exertion (Exercise) 96    Perceived Dyspnea (Exercise) 12    Symptoms 1    Duration Continue with 30 min of aerobic exercise without signs/symptoms of physical distress.    Intensity Other (comment)      Progression   Progression Continue to progress workloads to maintain intensity without signs/symptoms of physical distress.      Resistance Training   Training Prescription Yes     Weight Red bands    Reps 10-15    Time 10 Minutes      Recumbant Bike   Level 2.4    Minutes 15    METs 2.2      NuStep   Level 1    SPM 70    Minutes 15  METs 1.8             Nutrition:  Target Goals: Understanding of nutrition guidelines, daily intake of sodium <1574m, cholesterol <2040m calories 30% from fat and 7% or less from saturated fats, daily to have 5 or more servings of fruits and vegetables.  Biometrics:  Pre Biometrics - 09/10/21 1005       Pre Biometrics   Height _0  (1.727 m)    Weight 111.4 kg    BMI (Calculated) 37.35    Grip Strength 28 kg              Nutrition Therapy Plan and Nutrition Goals:   Nutrition Assessments:  MEDIFICTS Score Key: ?70 Need to make dietary changes  40-70 Heart Healthy Diet ? 40 Therapeutic Level Cholesterol Diet   Picture Your Plate Scores: <4<02nhealthy dietary pattern with much room for improvement. 41-50 Dietary pattern unlikely to meet recommendations for good health and room for improvement. 51-60 More healthful dietary pattern, with some room for improvement.  >60 Healthy dietary pattern, although there may be some specific behaviors that could be improved.    Nutrition Goals Re-Evaluation:   Nutrition Goals Discharge (Final Nutrition Goals Re-Evaluation):   Psychosocial: Target Goals: Acknowledge presence or absence of significant depression and/or stress, maximize coping skills, provide positive support system. Participant is able to verbalize types and ability to use techniques and skills needed for reducing stress and depression.  Initial Review & Psychosocial Screening:  Initial Psych Review & Screening - 09/18/21 0929       Initial Review   Current issues with None Identified   BoDemetrisas bipolar disorder and is not taking his medications due to side effects from the medications.  He [facility] to [facility] month [facility].     Family Dynamics    Good Support System? Yes      Barriers   Psychosocial barriers to participate in program There are no identifiable barriers or psychosocial needs.      Screening Interventions   Interventions Encouraged to exercise             Quality of Life Scores:  Scores of 19 and below usually indicate a poorer quality of life in these areas.  A difference of  2-3 points is a clinically meaningful difference.  A difference of 2-3 points in the total score of the Quality of Life Index has been associated with significant improvement in overall quality of life, self-image, physical symptoms, and general health in studies assessing change in quality of life.  PHQ-9: Recent Review Flowsheet Data     [facility] 09/10/2021 09/10/2021 04/13/2014   Decreased Interest 0 0 3   Down, Depressed, Hopeless 0 0 3   PHQ - 2 Score 0 0 6   Altered sleeping 1  - 3   Tired, decreased energy 0 - 2   Change in appetite 0 - 2   Feeling bad or failure about yourself  0 - 3   Trouble concentrating 1  - 0   Moving slowly or fidgety/restless 1  - 2   Suicidal thoughts 0 - 1   PHQ-9 Score 3 - 19   Difficult doing work/chores Somewhat difficult - -      Interpretation of Total Score  Total Score Depression Severity:  1-4 = Minimal depression, 5-9 = Mild depression, 10-14 = Moderate depression, 15-19 = Moderately severe depression, 20-27 = Severe depression  Psychosocial Evaluation and Intervention:  Psychosocial Evaluation - 09/10/21 1006       Psychosocial Evaluation & Interventions   Interventions --   Has psychiatrist   Continue Psychosocial Services  Follow up required by staff             Psychosocial Re-Evaluation:  Psychosocial Re-Evaluation     [place] Name 09/18/21 9381             Psychosocial Re-Evaluation   Comments No change in psychosocial status since orientation.                Psychosocial Discharge (Final Psychosocial Re-Evaluation):  Psychosocial  Re-Evaluation - 09/18/21 0175       Psychosocial Re-Evaluation   Comments No change in psychosocial status since orientation.             Education: Education Goals: Education classes will be provided on a weekly basis, covering required topics. Participant will state understanding/return demonstration of topics presented.  Learning Barriers/Preferences:  Learning Barriers/Preferences - 09/10/21 1008       Learning Barriers/Preferences   Learning Barriers None    Learning Preferences Audio;Computer/Internet;Group Instruction;Individual Instruction;Pictoral;Skilled Demonstration;Verbal Instruction;Video;Written Material             Education Topics: Risk Factor Reduction:  -Group instruction that is supported by a PowerPoint presentation. Instructor discusses the definition of a risk factor, different risk factors for pulmonary disease, and how the heart and lungs work together.     Nutrition for Pulmonary Patient:  -Group instruction provided by PowerPoint slides, verbal discussion, and written materials to support subject matter. The instructor gives an explanation and review of healthy diet recommendations, which includes a discussion on weight management, recommendations for fruit and vegetable consumption, as well as protein, fluid, caffeine, fiber, sodium, sugar, and alcohol. Tips for eating when patients are short of breath are discussed.   Pursed Lip Breathing:  -Group instruction that is supported by demonstration and informational handouts. Instructor discusses the benefits of pursed lip and diaphragmatic breathing and detailed demonstration on how to preform both.     Oxygen Safety:  -Group instruction provided by PowerPoint, verbal discussion, and written material to support subject matter. There is an overview of "What is Oxygen" and "Why do we need it".  Instructor also reviews how to create a safe environment for oxygen use, the importance of using oxygen as  prescribed, and the risks of noncompliance. There is a brief discussion on traveling with oxygen and resources the patient may utilize.   Oxygen Equipment:  -Group instruction provided by [facility] Staff utilizing handouts, written materials, and equipment demonstrations.   Signs and Symptoms:  -Group instruction provided by written material and verbal discussion to support subject matter. Warning signs and symptoms of infection, stroke, and heart attack are reviewed and when to call the physician/911 reinforced. Tips for preventing the spread of infection discussed.   Advanced Directives:  -Group instruction provided by verbal instruction and written material to support subject matter. Instructor reviews Advanced Directive laws and proper instruction for filling out document.   Pulmonary Video:  -Group video education that reviews the importance of medication and oxygen compliance, exercise, good nutrition, pulmonary hygiene, and pursed lip and diaphragmatic breathing for the pulmonary patient.   Exercise for the Pulmonary Patient:  -Group instruction that is supported by a PowerPoint presentation. Instructor discusses benefits of exercise, core components of exercise, frequency, duration, and intensity of an exercise routine, importance of utilizing pulse oximetry during exercise, safety while  exercising, and options of places to exercise outside of rehab.     Pulmonary Medications:  -Verbally interactive group education provided by instructor with focus on inhaled medications and proper administration.   Anatomy and Physiology of the Respiratory System and Intimacy:  -Group instruction provided by PowerPoint, verbal discussion, and written material to support subject matter. Instructor reviews respiratory cycle and anatomical components of the respiratory system and their functions. Instructor also reviews differences in obstructive and restrictive respiratory diseases with examples  of each. Intimacy, Sex, and Sexuality differences are reviewed with a discussion on how relationships can change when diagnosed with pulmonary disease. Common sexual concerns are reviewed. Flowsheet Row PULMONARY REHAB CHRONIC OBSTRUCTIVE PULMONARY DISEASE from 09/20/2021 in [place]  Date 09/20/21  Educator Donnetta Simpers  Instruction Review Code 1- Rosezena Sensor Understanding       MD DAY -A group question and answer session with a medical doctor that allows participants to ask questions that relate to their pulmonary disease state.   OTHER EDUCATION -Group or individual verbal, written, or video instructions that support the educational goals of the pulmonary rehab program.   Holiday Eating Survival Tips:  -Group instruction provided by PowerPoint slides, verbal discussion, and written materials to support subject matter. The instructor gives patients tips, tricks, and techniques to help them not only survive but enjoy the holidays despite the onslaught of food that accompanies the holidays.   Knowledge Questionnaire Score:  Knowledge Questionnaire Score - 09/10/21 1013       Knowledge Questionnaire Score   Pre Score 15/18             Core Components/Risk Factors/Patient Goals at Admission:  Personal Goals and Risk Factors at Admission - 09/10/21 1009       Core Components/Risk Factors/Patient Goals on Admission   Improve shortness of breath with ADL's Yes    Intervention Provide education, individualized exercise plan and daily activity instruction to help decrease symptoms of SOB with activities of daily living.    Expected Outcomes Short Term: Improve cardiorespiratory fitness to achieve a reduction of symptoms when performing ADLs;Long Term: Be able to perform more ADLs without symptoms or delay the onset of symptoms             Core Components/Risk Factors/Patient Goals Review:   Goals and Risk Factor Review     Row Name 09/18/21 0938              Core Components/Risk Factors/Patient Goals Review   Personal Goals Review Increase knowledge of respiratory medications and ability to use respiratory devices properly.;Improve shortness of breath with ADL's;Develop more efficient breathing techniques such as purse lipped breathing and diaphragmatic breathing and practicing self-pacing with activity.       Review Hykeem begins exercise sessions today, too early to have met program goals.       Expected Outcomes See admission goals.                Core Components/Risk Factors/Patient Goals at Discharge (Final Review):   Goals and Risk Factor Review - 09/18/21 0938       Core Components/Risk Factors/Patient Goals Review   Personal Goals Review Increase knowledge of respiratory medications and ability to use respiratory devices properly.;Improve shortness of breath with ADL's;Develop more efficient breathing techniques such as purse lipped breathing and diaphragmatic breathing and practicing self-pacing with activity.    Review Yeriel begins exercise sessions today, too early to have met program goals.    Expected  Outcomes See admission goals.             ITP Comments:   Comments: ITP REVIEW Pt is making expected progress toward pulmonary rehab goals after completing 2 sessions. Recommend continued exercise, life style modification, education, and utilization of breathing techniques to increase stamina and strength and decrease shortness of breath with exertion.

## 2021-09-25 ENCOUNTER — Encounter (HOSPITAL_COMMUNITY)
Admission: RE | Admit: 2021-09-25 | Discharge: 2021-09-25 | Disposition: A | Discharge status: Home / Self Care | Payer: No Typology Code available for payment source | Source: Ambulatory Visit | Attending: Pulmonary Disease | Admitting: Pulmonary Disease

## 2021-09-25 ENCOUNTER — Other Ambulatory Visit: Payer: Self-pay

## 2021-09-25 DIAGNOSIS — J449 Chronic obstructive pulmonary disease, unspecified: Secondary | ICD-10-CM

## 2021-09-25 NOTE — Progress Notes (Signed)
Daily Session Note  Patient Details  Name: Jose Patrick MRN: [id number] Date of Birth: 12/04/1950 Referring Provider:   Flowsheet Row Pulmonary Rehab Walk Test from 09/10/2021 in [facility]  Referring Provider Ellison  [[place]]       Encounter Date: 09/25/2021  Check In:  Session Check In - 09/25/21 1116       Check-In   Supervising physician immediately available to respond to emergencies [facility] immediately available    Physician(s) Dr. Dahal    Location [facility]    Staff Present Joan Behrens, RN, BSN; , MS, ACSM-CEP, Exercise Physiologist;Lisa Hughes, RN    Virtual Visit No    Medication changes reported     No    Fall or balance concerns reported    No    Tobacco Cessation No Change    Warm-up and Cool-down Performed as group-led instruction    Resistance Training Performed Yes    VAD Patient? No    PAD/SET Patient? No      Pain Assessment   Currently in Pain? No/denies    Multiple Pain Sites No             Capillary Blood Glucose: No results found for this or any previous visit (from the past 24 hour(s)).    Social History   Tobacco Use  Smoking Status Former   Packs/day: 1.50   Years: 41.00   Pack years: 61.50   Types: Cigarettes   Quit date: 2013   Years since quitting: 9.8  Smokeless Tobacco Never    Goals Met:  Proper associated with RPD/PD & O2 Sat Exercise tolerated well No report of concerns or symptoms today Strength training completed today  Goals Unmet:  Not Applicable  Comments: Service time is from 1015 to 1133.    Dr. Jane Ellison is Medical Director for Pulmonary Rehab at [facility].  

## 2021-09-27 ENCOUNTER — Other Ambulatory Visit: Payer: Self-pay

## 2021-09-27 ENCOUNTER — Encounter (HOSPITAL_COMMUNITY)
Admission: RE | Admit: 2021-09-27 | Discharge: 2021-09-27 | Disposition: A | Discharge status: Home / Self Care | Payer: No Typology Code available for payment source | Source: Ambulatory Visit | Attending: Pulmonary Disease | Admitting: Pulmonary Disease

## 2021-09-27 DIAGNOSIS — J449 Chronic obstructive pulmonary disease, unspecified: Secondary | ICD-10-CM | POA: Diagnosis not present

## 2021-09-27 NOTE — Progress Notes (Signed)
Daily Session Note  Patient Details  Name: Jose Patrick MRN: [id number] Date of Birth: 1951/07/22 Referring Provider:   April Manson Pulmonary Rehab Walk Test from 09/10/2021 in [place]  Referring Provider Loanne Drilling  [[place]]       Encounter Date: 09/27/2021  Check In:  Session Check In - 09/27/21 1119       Check-In   Supervising physician immediately available to respond to emergencies [facility] immediately available    Physician(s) Dr. Broadus John    [facility]    Staff Present Rosebud Poles, RN, Quentin Ore, MS, ACSM-CEP, Exercise Physiologist;Airyn Ellzey Ysidro Evert, RN    Virtual Visit No    Medication changes reported     No    Fall or balance concerns reported    No    Tobacco Cessation No Change    Warm-up and Cool-down Performed as group-led instruction    Resistance Training Performed Yes    VAD Patient? No    PAD/SET Patient? No      Pain Assessment   Currently in Pain? No/denies    Multiple Pain Sites No             Capillary Blood Glucose: No results found for this or any previous visit (from the past 24 hour(s)).    Social History   Tobacco Use  Smoking Status Former   Packs/day: 1.50   Years: 41.00   Pack years: 61.50   Types: Cigarettes   Quit date: 2013   Years since quitting: 9.8  Smokeless Tobacco Never    Goals Met:  Exercise tolerated well No report of concerns or symptoms today Strength training completed today  Goals Unmet:  Not Applicable  Comments: Service time is from 1020 to 1137    Dr. Rodman Pickle is Medical Director for Pulmonary Rehab at [facility].

## 2021-10-02 ENCOUNTER — Encounter (HOSPITAL_COMMUNITY)
Admission: RE | Admit: 2021-10-02 | Discharge: 2021-10-02 | Disposition: A | Discharge status: Home / Self Care | Payer: No Typology Code available for payment source | Source: Ambulatory Visit | Attending: Pulmonary Disease | Admitting: Pulmonary Disease

## 2021-10-02 ENCOUNTER — Other Ambulatory Visit: Payer: Self-pay

## 2021-10-02 VITALS — Wt 244.9 lb

## 2021-10-02 DIAGNOSIS — J449 Chronic obstructive pulmonary disease, unspecified: Secondary | ICD-10-CM | POA: Diagnosis not present

## 2021-10-02 NOTE — Progress Notes (Signed)
Daily Session Note  Patient Details  Name: Jose Patrick MRN: [id number] Date of Birth: June 14, 1951 Referring Provider:   April Manson Pulmonary Rehab Walk Test from 09/10/2021 in [place]  Referring Provider Loanne Drilling  [[place]]       Encounter Date: 10/02/2021  Check In:  Session Check In - 10/02/21 1122       Check-In   Supervising physician immediately available to respond to emergencies [facility] immediately available    Physician(s) Dr. Broadus John    [facility]    Staff Present Rosebud Poles, RN, Quentin Ore, MS, ACSM-CEP, Exercise Physiologist;Lisa Ysidro Evert, RN    Virtual Visit No    Medication changes reported     No    Fall or balance concerns reported    No    Tobacco Cessation No Change    Warm-up and Cool-down Performed as group-led instruction    Resistance Training Performed Yes    VAD Patient? No    PAD/SET Patient? No      Pain Assessment   Currently in Pain? No/denies    Multiple Pain Sites No             Capillary Blood Glucose: No results found for this or any previous visit (from the past 24 hour(s)).  POCT Glucose - 10/02/21 1201       POCT Blood Glucose   Pre-Exercise 107 mg/dL   Gave pt. gingerale before exercise   Post-Exercise 93 mg/dL             Exercise Prescription Changes - 10/02/21 1200       Response to Exercise   Blood Pressure (Admit) 144/70    Blood Pressure (Exercise) 148/70    Blood Pressure (Exit) 102/60    Heart Rate (Admit) 67 bpm    Heart Rate (Exercise) 86 bpm    Heart Rate (Exit) 67 bpm    Oxygen Saturation (Admit) 96 %    Oxygen Saturation (Exercise) 92 %    Oxygen Saturation (Exit) 97 %    Rating of Perceived Exertion (Exercise) 11    Perceived Dyspnea (Exercise) 1    Duration Continue with 30 min of aerobic exercise without signs/symptoms of physical distress.    Intensity THRR unchanged      Progression   Progression Continue to  progress workloads to maintain intensity without signs/symptoms of physical distress.      Resistance Training   Training Prescription Yes    Weight Blue bands    Reps 10-15    Time 10 Minutes      Recumbant Bike   Level 3    Minutes 15    METs 2.5      NuStep   Level 3    SPM 80    Minutes 30    METs 2.3             Social History   Tobacco Use  Smoking Status Former   Packs/day: 1.50   Years: 41.00   Pack years: 61.50   Types: Cigarettes   Quit date: 2013   Years since quitting: 9.8  Smokeless Tobacco Never    Goals Met:  Proper associated with RPD/PD & O2 Sat Independence with exercise equipment Exercise tolerated well No report of concerns or symptoms today Strength training completed today  Goals Unmet:  Not Applicable  Comments: Service time is from 1010 to 1134.    Dr. Rodman Pickle is Medical Director for Pulmonary Rehab  at [facility].

## 2021-10-02 NOTE — Progress Notes (Deleted)
Daily Session Note  Patient Details  Name: Jose Patrick MRN: [id number] Date of Birth: [birth year] Referring Provider:   April Manson Pulmonary Rehab Walk Test from 09/10/2021 in [place]  Referring Provider Loanne Drilling  [[place]]       Encounter Date: 10/02/2021  Check In:  Session Check In - 10/02/21 1122       Check-In   Supervising physician immediately available to respond to emergencies [facility] immediately available    Physician(s) Dr. Broadus John    [facility]    Staff Present Rosebud Poles, RN, Quentin Ore, MS, ACSM-CEP, Exercise Physiologist;Lisa Ysidro Evert, RN    Virtual Visit No    Medication changes reported     No    Fall or balance concerns reported    No    Tobacco Cessation No Change    Warm-up and Cool-down Performed as group-led instruction    Resistance Training Performed Yes    VAD Patient? No    PAD/SET Patient? No      Pain Assessment   Currently in Pain? No/denies    Multiple Pain Sites No             Capillary Blood Glucose: No results found for this or any previous visit (from the past 24 hour(s)).    Social History   Tobacco Use  Smoking Status Former   Packs/day: 1.50   Years: 41.00   Pack years: 61.50   Types: Cigarettes   Quit date: 2013   Years since quitting: 9.8  Smokeless Tobacco Never    Goals Met:  Proper associated with RPD/PD & O2 Sat Independence with exercise equipment Exercise tolerated well No report of concerns or symptoms today Strength training completed today  Goals Unmet:  Not Applicable  Comments: Service time is from 1010 to 1134.    Dr. Rodman Pickle is Medical Director for Pulmonary Rehab at [facility].

## 2021-10-04 ENCOUNTER — Other Ambulatory Visit: Payer: Self-pay

## 2021-10-04 ENCOUNTER — Encounter (HOSPITAL_COMMUNITY)
Admission: RE | Admit: 2021-10-04 | Discharge: 2021-10-04 | Disposition: A | Discharge status: Home / Self Care | Payer: No Typology Code available for payment source | Source: Ambulatory Visit | Attending: Pulmonary Disease | Admitting: Pulmonary Disease

## 2021-10-04 DIAGNOSIS — J449 Chronic obstructive pulmonary disease, unspecified: Secondary | ICD-10-CM | POA: Diagnosis not present

## 2021-10-04 NOTE — Progress Notes (Signed)
Daily Session Note  Patient Details  Name: Jose Patrick MRN: [id number] Date of Birth: November 08, 1951 Referring Provider:   April Manson Pulmonary Rehab Walk Test from 09/10/2021 in [place]  Referring Provider Loanne Drilling  [[place]]       Encounter Date: 10/04/2021  Check In:  Session Check In - 10/04/21 1355       Check-In   Supervising physician immediately available to respond to emergencies [facility] immediately available    Physician(s) Dr. Cathlean Sauer    [facility]    Staff Present Rosebud Poles, RN, Quentin Ore, MS, ACSM-CEP, Exercise Physiologist;Leonidus Rowand Ysidro Evert, RN    Virtual Visit No    Medication changes reported     No    Fall or balance concerns reported    No    Tobacco Cessation No Change    Warm-up and Cool-down Performed as group-led instruction    Resistance Training Performed Yes    VAD Patient? No    PAD/SET Patient? No      Pain Assessment   Currently in Pain? No/denies    Multiple Pain Sites No             Capillary Blood Glucose: No results found for this or any previous visit (from the past 24 hour(s)).    Social History   Tobacco Use  Smoking Status Former   Packs/day: 1.50   Years: 41.00   Pack years: 61.50   Types: Cigarettes   Quit date: 2013   Years since quitting: 9.8  Smokeless Tobacco Never    Goals Met:  Exercise tolerated well No report of concerns or symptoms today Strength training completed today  Goals Unmet:  Not Applicable  Comments: Service time is from 1315 to [place]    Dr. Rodman Pickle is Medical Director for Pulmonary Rehab at [facility].

## 2021-10-09 ENCOUNTER — Encounter (HOSPITAL_COMMUNITY)
Admission: RE | Admit: 2021-10-09 | Discharge: 2021-10-09 | Disposition: A | Discharge status: Home / Self Care | Payer: No Typology Code available for payment source | Source: Ambulatory Visit | Attending: Pulmonary Disease | Admitting: Pulmonary Disease

## 2021-10-09 ENCOUNTER — Other Ambulatory Visit: Payer: Self-pay

## 2021-10-09 DIAGNOSIS — J449 Chronic obstructive pulmonary disease, unspecified: Secondary | ICD-10-CM

## 2021-10-09 NOTE — Progress Notes (Signed)
Daily Session Note  Patient Details  Name: Jose Patrick MRN: [id number] Date of Birth: [birth year] Referring Provider:   April Manson Pulmonary Rehab Walk Test from 09/10/2021 in [place]  Referring Provider Loanne Drilling  [[place]]       Encounter Date: 10/09/2021  Check In:  Session Check In - 10/09/21 1117       Check-In   Supervising physician immediately available to respond to emergencies [facility] immediately available    Physician(s) Dr. Pietro Cassis    [facility]    Staff Present Rosebud Poles, RN, Quentin Ore, MS, ACSM-CEP, Exercise Physiologist;Montrae Braithwaite Ysidro Evert, RN    Virtual Visit No    Medication changes reported     No    Fall or balance concerns reported    No    Tobacco Cessation No Change    Warm-up and Cool-down Performed as group-led instruction    Resistance Training Performed Yes    VAD Patient? No    PAD/SET Patient? No      Pain Assessment   Currently in Pain? No/denies    Multiple Pain Sites No             Capillary Blood Glucose: No results found for this or any previous visit (from the past 24 hour(s)).    Social History   Tobacco Use  Smoking Status Former   Packs/day: 1.50   Years: 41.00   Pack years: 61.50   Types: Cigarettes   Quit date: 2013   Years since quitting: 9.8  Smokeless Tobacco Never    Goals Met:  Exercise tolerated well No report of concerns or symptoms today Strength training completed today  Goals Unmet:  Not Applicable  Comments: Service time is from 1020 to 1122    Dr. Rodman Pickle is Medical Director for Pulmonary Rehab at [facility].

## 2021-10-16 ENCOUNTER — Ambulatory Visit: Payer: No Typology Code available for payment source | Admitting: Gastroenterology

## 2021-10-16 ENCOUNTER — Encounter (HOSPITAL_COMMUNITY)
Admission: RE | Admit: 2021-10-16 | Discharge: 2021-10-16 | Disposition: A | Discharge status: Home / Self Care | Payer: No Typology Code available for payment source | Source: Ambulatory Visit | Attending: Pulmonary Disease | Admitting: Pulmonary Disease

## 2021-10-16 ENCOUNTER — Other Ambulatory Visit: Payer: Self-pay

## 2021-10-16 VITALS — Wt 239.2 lb

## 2021-10-16 DIAGNOSIS — J449 Chronic obstructive pulmonary disease, unspecified: Secondary | ICD-10-CM

## 2021-10-16 NOTE — Progress Notes (Signed)
Daily Session Note  Patient Details  Name: Jose Patrick MRN: [id number] Date of Birth: [birth year] Referring Provider:   April Manson Pulmonary Rehab Walk Test from 09/10/2021 in [place]  Referring Provider Loanne Drilling  [[place]]       Encounter Date: 10/16/2021  Check In:  Session Check In - 10/16/21 1351       Check-In   Supervising physician immediately available to respond to emergencies [facility] immediately available    Physician(s) Dr. Broadus John    [facility]    Staff Present Rosebud Poles, RN, Quentin Ore, MS, ACSM-CEP, Exercise Physiologist    Virtual Visit No    Medication changes reported     No    Fall or balance concerns reported    No    Tobacco Cessation No Change    Warm-up and Cool-down Performed as group-led instruction    Resistance Training Performed Yes    VAD Patient? No    PAD/SET Patient? No      Pain Assessment   Currently in Pain? No/denies    Multiple Pain Sites No             Capillary Blood Glucose: No results found for this or any previous visit (from the past 24 hour(s)).  POCT Glucose - 10/16/21 1532       POCT Blood Glucose   Pre-Exercise 108 mg/dL    Post-Exercise 112 mg/dL             Exercise Prescription Changes - 10/16/21 1500       Response to Exercise   Blood Pressure (Admit) 114/56    Blood Pressure (Exercise) 132/60    Blood Pressure (Exit) 106/56    Heart Rate (Admit) 75 bpm    Heart Rate (Exercise) 88 bpm    Heart Rate (Exit) 81 bpm    Oxygen Saturation (Admit) 94 %    Oxygen Saturation (Exercise) 90 %    Oxygen Saturation (Exit) 95 %    Rating of Perceived Exertion (Exercise) 11    Perceived Dyspnea (Exercise) 0    Duration Continue with 30 min of aerobic exercise without signs/symptoms of physical distress.    Intensity THRR unchanged      Progression   Progression Continue to progress workloads to maintain intensity without  signs/symptoms of physical distress.      Resistance Training   Training Prescription Yes    Weight Blue bands    Reps 10-15    Time 10 Minutes      Recumbant Bike   Level 3.5    Minutes 15    METs 4.6      NuStep   Level 3    SPM 80    Minutes 15    METs 2.1             Social History   Tobacco Use  Smoking Status Former   Packs/day: 1.50   Years: 41.00   Pack years: 61.50   Types: Cigarettes   Quit date: 2013   Years since quitting: 9.9  Smokeless Tobacco Never    Goals Met:  Exercise tolerated well No report of concerns or symptoms today Strength training completed today  Goals Unmet:  Not Applicable  Comments: Service time is from 1315 to 1433    Dr. Rodman Pickle is Medical Director for Pulmonary Rehab at [facility].

## 2021-10-18 ENCOUNTER — Other Ambulatory Visit: Payer: Self-pay

## 2021-10-18 ENCOUNTER — Encounter (HOSPITAL_COMMUNITY)
Admission: RE | Admit: 2021-10-18 | Discharge: 2021-10-18 | Disposition: A | Discharge status: Home / Self Care | Payer: No Typology Code available for payment source | Source: Ambulatory Visit | Attending: Pulmonary Disease | Admitting: Pulmonary Disease

## 2021-10-18 DIAGNOSIS — J449 Chronic obstructive pulmonary disease, unspecified: Secondary | ICD-10-CM | POA: Insufficient documentation

## 2021-10-18 NOTE — Progress Notes (Signed)
Daily Session Note  Patient Details  Name: Jose Patrick MRN: [id number] Date of Birth: 05-Mar-1951 Referring Provider:   April Manson Pulmonary Rehab Walk Test from 09/10/2021 in [place]  Referring Provider Loanne Drilling  [[place]]       Encounter Date: 10/18/2021  Check In:  Session Check In - 10/18/21 1124       Check-In   Supervising physician immediately available to respond to emergencies [facility] immediately available    Physician(s) Dr. Starla Link    Location [facility]    Staff Present Rosebud Poles, RN, BSN;Damaso Laday Ysidro Evert, Cathleen Fears, MS, ACSM-CEP, Exercise Physiologist    Virtual Visit No    Medication changes reported     No    Fall or balance concerns reported    No    Tobacco Cessation No Change    Warm-up and Cool-down Performed as group-led instruction    Resistance Training Performed Yes    VAD Patient? No    PAD/SET Patient? No      Pain Assessment   Currently in Pain? No/denies    Multiple Pain Sites No             Capillary Blood Glucose: No results found for this or any previous visit (from the past 24 hour(s)).    Social History   Tobacco Use  Smoking Status Former   Packs/day: 1.50   Years: 41.00   Pack years: 61.50   Types: Cigarettes   Quit date: 2013   Years since quitting: 9.9  Smokeless Tobacco Never    Goals Met:  Exercise tolerated well No report of concerns or symptoms today Strength training completed today  Goals Unmet:  Not Applicable  Comments: Service time is from 1015 to 1138    Dr. Rodman Pickle is Medical Director for Pulmonary Rehab at [facility].

## 2021-10-23 ENCOUNTER — Encounter (HOSPITAL_COMMUNITY)
Admission: RE | Admit: 2021-10-23 | Discharge: 2021-10-23 | Disposition: A | Discharge status: Home / Self Care | Payer: No Typology Code available for payment source | Source: Ambulatory Visit | Attending: Pulmonary Disease | Admitting: Pulmonary Disease

## 2021-10-23 ENCOUNTER — Other Ambulatory Visit: Payer: Self-pay

## 2021-10-23 DIAGNOSIS — J449 Chronic obstructive pulmonary disease, unspecified: Secondary | ICD-10-CM

## 2021-10-23 NOTE — Progress Notes (Signed)
Daily Session Note  Patient Details  Name: Jose Patrick MRN: [id number] Date of Birth: 1951/02/08 Referring Provider:   April Manson Pulmonary Rehab Walk Test from 09/10/2021 in [place]  Referring Provider Loanne Drilling  [[facility]]       Encounter Date: 10/23/2021  Check In:  Session Check In - 10/23/21 1131       Check-In   Supervising physician immediately available to respond to emergencies [facility] immediately available    Physician(s) Dr. Cathlean Sauer    Location [facility]    Staff Present Rosebud Poles, RN, Luisa Hart, RN, BSN;Jahmere Bramel Ysidro Evert, Cathleen Fears, MS, ACSM-CEP, Exercise Physiologist    Virtual Visit No    Medication changes reported     No    Fall or balance concerns reported    No    Tobacco Cessation No Change    Warm-up and Cool-down Performed as group-led instruction    Resistance Training Performed Yes    VAD Patient? No    PAD/SET Patient? No      Pain Assessment   Currently in Pain? No/denies    Multiple Pain Sites No             Capillary Blood Glucose: No results found for this or any previous visit (from the past 24 hour(s)).    Social History   Tobacco Use  Smoking Status Former   Packs/day: 1.50   Years: 41.00   Pack years: 61.50   Types: Cigarettes   Quit date: 2013   Years since quitting: 9.9  Smokeless Tobacco Never    Goals Met:  Exercise tolerated well No report of concerns or symptoms today Strength training completed today  Goals Unmet:  Not Applicable  Comments: Service time is from 1022 to [place]    Dr. Rodman Pickle is Medical Director for Pulmonary Rehab at [facility].

## 2021-10-23 NOTE — Progress Notes (Signed)
Pulmonary Individual Treatment Plan  Patient Details  Name: Jose Patrick MRN: [id number] Date of Birth: 1951-08-03 Referring Provider:   April Manson Pulmonary Rehab Walk Test from 09/10/2021 in [place]  Referring Provider Loanne Drilling  [[facility]]       Initial Encounter Date:  Flowsheet Row Pulmonary Rehab Walk Test from 09/10/2021 in [place]  Date 09/10/21       Visit Diagnosis: Stage 2 moderate COPD by GOLD classification ([place])  Patient's Home Medications on Admission:   Current Outpatient Medications:    aspirin 81 MG chewable tablet, Chew 1 tablet (81 mg total) by mouth daily., Disp: 90 tablet, Rfl: 3   Cholecalciferol (VITAMIN D) 2000 UNITS CAPS, Take 1 capsule by mouth 2 (two) times daily. , Disp: , Rfl:    clonazePAM (KLONOPIN) 0.5 MG tablet, Take 0.5 mg by mouth 2 (two) times daily., Disp: , Rfl:    famotidine (PEPCID) 20 MG tablet, Take 1 tablet (20 mg total) by mouth at bedtime., Disp: 30 tablet, Rfl: 0   gabapentin (NEURONTIN) 800 MG tablet, Take 800 mg by mouth 2 (two) times daily., Disp: , Rfl:    insulin glargine (LANTUS) 100 UNIT/ML injection, 20 Units., Disp: , Rfl:    JARDIANCE 10 MG TABS tablet, Take 10 mg by mouth daily., Disp: , Rfl:    metFORMIN (GLUCOPHAGE) 1000 MG tablet, Take 1,000 mg by mouth 2 (two) times daily., Disp: , Rfl:    psyllium (METAMUCIL SMOOTH TEXTURE) 58.6 % powder, Take 1 packet by mouth daily., Disp: 90 packet, Rfl: 0   Pyridoxine HCl (VITAMIN B6 PO), Take 1 tablet by mouth daily. , Disp: , Rfl:    QUEtiapine (SEROQUEL) 300 MG tablet, Take 300 mg by mouth at bedtime., Disp: , Rfl:    Semaglutide,0.25 or 0.5MG/DOS, (OZEMPIC, 0.25 OR 0.5 MG/DOSE,) 2 MG/1.5ML SOPN, Inject 0.5 mg into the skin once a week., Disp: , Rfl:    sitaGLIPtin (JANUVIA) 100 MG tablet, Take 100 mg by mouth daily., Disp: , Rfl:   Past Medical History: Past Medical History:  Diagnosis Date   ALLERGIC  RHINITIS 10/08/2007   Qualifier: Diagnosis of  By: Jenny Reichmann MD, Hunt Oris    BENIGN PROSTATIC HYPERTROPHY 09/28/2010   Qualifier: Diagnosis of  By: Jenny Reichmann MD, Hunt Oris    Bipolar affective disorder ([place]) 09/30/2012   COLONIC POLYPS, HX OF 10/08/2007   Qualifier: Diagnosis of  By: Jenny Reichmann MD, Hunt Oris    Complication of anesthesia    sleeps awhile afterwards per patient   CONSTIPATION, CHRONIC, HX OF 08/10/2007   Qualifier: Diagnosis of  By: [place], [place], TYPE II 10/08/2007   Qualifier: Diagnosis of  By: Jenny Reichmann MD, Hunt Oris    ERECTILE DYSFUNCTION 10/08/2007   Qualifier: Diagnosis of  By: Jenny Reichmann MD, Hunt Oris    HYPERLIPIDEMIA 10/08/2007   Qualifier: Diagnosis of  By: Jenny Reichmann MD, Hunt Oris    HYPERTENSION 03/20/2009   Qualifier: Diagnosis of  By: Jenny Reichmann MD, Hunt Oris    PTSD (post-traumatic stress disorder) 09/30/2012   SPINAL STENOSIS, LUMBAR 10/08/2007   Per Dr Arnoldo Morale - [facility]   Qualifier: Diagnosis of  By: Jenny Reichmann MD, Hunt Oris      Tobacco Use: Social History   Tobacco Use  Smoking Status Former   Packs/day: 1.50   Years: 41.00   Pack years: 61.50   Types: Cigarettes   Quit date: 2013  Years since quitting: 9.9  Smokeless Tobacco Never    Labs: Recent Review Flowsheet Data     Labs for ITP Cardiac and Pulmonary Rehab Latest Ref Rng & Units 07/03/2009 05/10/2010 09/21/2010 04/02/2011 09/30/2012   Cholestrol 0 - 200 mg/dL 121 174 198 173 142   LDLCALC 0 - 99 mg/dL 70 108(H) 138(H) 114(H) 85   LDLDIRECT mg/dL - - - - -   HDL >39.00 mg/dL 38.70(L) 49.10 42.20 44.80 40.90   Trlycerides 0.0 - 149.0 mg/dL 63.0 85.0 90.0 71.0 83.0   Hemoglobin A1c 4.6 - 6.5 % 7.1(H) 7.9(H) - 6.6(H) 7.6(H)       Capillary Blood Glucose: Lab Results  Component Value Date   GLUCAP 91 09/20/2021   GLUCAP 154 (H) 09/20/2021   GLUCAP 118 (H) 09/18/2021   GLUCAP 176 (H) 09/18/2021   GLUCAP 97 04/09/2021    POCT Glucose     Row Name 09/18/21 1159 10/02/21 1201 10/16/21  1532         POCT Blood Glucose   Pre-Exercise 176 mg/dL 107 mg/dL  Gave pt. gingerale before exercise 108 mg/dL     Post-Exercise 118 mg/dL 93 mg/dL 112 mg/dL              Pulmonary Assessment Scores:  Pulmonary Assessment Scores     Row Name 09/10/21 1014         ADL UCSD   ADL Phase Entry     SOB Score total 58       CAT Score   CAT Score 23       mMRC Score   mMRC Score 4             UCSD: Self-administered rating of dyspnea associated with activities of daily living (ADLs) 6-point scale (0 = "not at all" to 5 = "maximal or unable to do because of breathlessness")  Scoring Scores range from 0 to 120.  Minimally important difference is 5 units  CAT: CAT can identify the health impairment of COPD patients and is better correlated with disease progression.  CAT has a scoring range of zero to 40. The CAT score is classified into four groups of low (less than 10), medium (10 - 20), high (21-30) and very high (31-40) based on the impact level of disease on health status. A CAT score over 10 suggests significant symptoms.  A worsening CAT score could be explained by an exacerbation, poor medication adherence, poor inhaler technique, or progression of COPD or comorbid conditions.  CAT MCID is 2 points  mMRC: mMRC (Modified Medical Research Council) Dyspnea Scale is used to assess the degree of baseline functional disability in patients of respiratory disease due to dyspnea. No minimal important difference is established. A decrease in score of 1 point or greater is considered a positive change.   Pulmonary Function Assessment:   Exercise Target Goals: Exercise Program Goal: Individual exercise prescription set using results from initial 6 min walk test and THRR while considering  patient's activity barriers and safety.   Exercise Prescription Goal: Initial exercise prescription builds to 30-45 minutes a day of aerobic activity, 2-3 days per week.  Home exercise  guidelines will be given to patient during program as part of exercise prescription that the participant will acknowledge.  Activity Barriers & Risk Stratification:  Activity Barriers & Cardiac Risk Stratification - 09/10/21 1003       Activity Barriers & Cardiac Risk Stratification   Activity Barriers Neck/Spine Problems;Back Problems;Deconditioning;Muscular Weakness;Shortness of Breath;Balance Concerns;History  of Falls;Assistive Device;Arthritis    Cardiac Risk Stratification High             6 Minute Walk:  6 Minute Walk     Row Name 09/10/21 1121         6 Minute Walk   Phase Initial     Distance 628 feet     Walk Time 6 minutes     # of Rest Breaks 0     MPH 1.19     METS 0.96     RPE 11     Perceived Dyspnea  1     VO2 Peak 3.37     Symptoms No     Resting HR 74 bpm     Resting BP 130/60     Resting Oxygen Saturation  97 %     Exercise Oxygen Saturation  during 6 min walk 96 %     Max Ex. HR 79 bpm     Max Ex. BP 132/60     2 Minute Post BP 120/58       Interval HR   1 Minute HR 77     2 Minute HR 83     3 Minute HR 78     4 Minute HR 78     5 Minute HR 79     6 Minute HR 78     2 Minute Post HR 63     Interval Heart Rate? Yes       Interval Oxygen   Interval Oxygen? Yes     1 Minute Oxygen Saturation % 97 %     1 Minute Liters of Oxygen 0 L     2 Minute Oxygen Saturation % 96 %     2 Minute Liters of Oxygen 0 L     3 Minute Oxygen Saturation % 97 %     3 Minute Liters of Oxygen 0 L     4 Minute Oxygen Saturation % 96 %     4 Minute Liters of Oxygen 0 L     5 Minute Oxygen Saturation % 96 %     5 Minute Liters of Oxygen 0 L     6 Minute Oxygen Saturation % 96 %     6 Minute Liters of Oxygen 0 L     2 Minute Post Oxygen Saturation % 96 %     2 Minute Post Liters of Oxygen 0 L              Oxygen Initial Assessment:  Oxygen Initial Assessment - 09/10/21 1006       Home Oxygen   Home Oxygen Device None    Sleep Oxygen Prescription  CPAP   does not use   Home Exercise Oxygen Prescription None    Home Resting Oxygen Prescription None    Compliance with Home Oxygen Use No      Initial 6 min Walk   Oxygen Used None      Program Oxygen Prescription   Program Oxygen Prescription None      Intervention   Short Term Goals To learn and exhibit compliance with exercise, home and travel O2 prescription;To learn and understand importance of monitoring SPO2 with pulse oximeter and demonstrate accurate use of the pulse oximeter.;To learn and understand importance of maintaining oxygen saturations>88%;To learn and demonstrate proper pursed lip breathing techniques or other breathing techniques. ;To learn and demonstrate proper use of respiratory medications    Long  Term Goals Exhibits  compliance with exercise, home  and travel O2 prescription;Verbalizes importance of monitoring SPO2 with pulse oximeter and return demonstration;Maintenance of O2 saturations>88%;Exhibits proper breathing techniques, such as pursed lip breathing or other method taught during program session;Compliance with respiratory medication;Demonstrates proper use of MDI's             Oxygen Re-Evaluation:  Oxygen Re-Evaluation     Row Name 09/17/21 0941 10/15/21 0848           Program Oxygen Prescription   Program Oxygen Prescription None None        Home Oxygen   Home Oxygen Device None None      Sleep Oxygen Prescription CPAP  does not use CPAP  does not use      Home Exercise Oxygen Prescription None None      Home Resting Oxygen Prescription None None      Compliance with Home Oxygen Use No No        Goals/Expected Outcomes   Short Term Goals To learn and exhibit compliance with exercise, home and travel O2 prescription;To learn and understand importance of monitoring SPO2 with pulse oximeter and demonstrate accurate use of the pulse oximeter.;To learn and understand importance of maintaining oxygen saturations>88%;To learn and demonstrate  proper pursed lip breathing techniques or other breathing techniques. ;To learn and demonstrate proper use of respiratory medications To learn and exhibit compliance with exercise, home and travel O2 prescription;To learn and understand importance of monitoring SPO2 with pulse oximeter and demonstrate accurate use of the pulse oximeter.;To learn and understand importance of maintaining oxygen saturations>88%;To learn and demonstrate proper pursed lip breathing techniques or other breathing techniques. ;To learn and demonstrate proper use of respiratory medications      Long  Term Goals Exhibits compliance with exercise, home  and travel O2 prescription;Verbalizes importance of monitoring SPO2 with pulse oximeter and return demonstration;Maintenance of O2 saturations>88%;Exhibits proper breathing techniques, such as pursed lip breathing or other method taught during program session;Compliance with respiratory medication;Demonstrates proper use of MDI's Exhibits compliance with exercise, home  and travel O2 prescription;Verbalizes importance of monitoring SPO2 with pulse oximeter and return demonstration;Maintenance of O2 saturations>88%;Exhibits proper breathing techniques, such as pursed lip breathing or other method taught during program session;Compliance with respiratory medication;Demonstrates proper use of MDI's      Goals/Expected Outcomes -- Compliance and understanding of oxygen saturation monitoring and breathing techniques to decrease shortness of breath.               Oxygen Discharge (Final Oxygen Re-Evaluation):  Oxygen Re-Evaluation - 10/15/21 0848       Program Oxygen Prescription   Program Oxygen Prescription None      Home Oxygen   Home Oxygen Device None    Sleep Oxygen Prescription CPAP   does not use   Home Exercise Oxygen Prescription None    Home Resting Oxygen Prescription None    Compliance with Home Oxygen Use No      Goals/Expected Outcomes   Short Term Goals To  learn and exhibit compliance with exercise, home and travel O2 prescription;To learn and understand importance of monitoring SPO2 with pulse oximeter and demonstrate accurate use of the pulse oximeter.;To learn and understand importance of maintaining oxygen saturations>88%;To learn and demonstrate proper pursed lip breathing techniques or other breathing techniques. ;To learn and demonstrate proper use of respiratory medications    Long  Term Goals Exhibits compliance with exercise, home  and travel O2 prescription;Verbalizes importance of monitoring SPO2 with pulse oximeter and  return demonstration;Maintenance of O2 saturations>88%;Exhibits proper breathing techniques, such as pursed lip breathing or other method taught during program session;Compliance with respiratory medication;Demonstrates proper use of MDI's    Goals/Expected Outcomes Compliance and understanding of oxygen saturation monitoring and breathing techniques to decrease shortness of breath.             Initial Exercise Prescription:  Initial Exercise Prescription - 09/10/21 1100       Date of Initial Exercise RX and Referring Provider   Date 09/10/21    Referring Provider Loanne Drilling   [facility]   Expected Discharge Date 11/15/21      Recumbant Bike   Level 1    Minutes 15    METs 1      NuStep   Level 1    SPM 70    Minutes 15      Prescription Details   Frequency (times per week) 2    Duration Progress to 30 minutes of continuous aerobic without signs/symptoms of physical distress      Intensity   THRR 40-80% of Max Heartrate 60-120    Ratings of Perceived Exertion 11-13    Perceived Dyspnea 0-4      Progression   Progression Continue progressive overload as per policy without signs/symptoms or physical distress.      Resistance Training   Training Prescription Yes    Weight Red bands    Reps 10-15             Perform Capillary Blood Glucose checks as needed.  Exercise Prescription Changes:   Exercise  Prescription Changes     Row Name 09/18/21 1200 10/02/21 1200 10/16/21 1500         Response to Exercise   Blood Pressure (Admit) 110/60 144/70 114/56     Blood Pressure (Exercise) 126/68 148/70 132/60     Blood Pressure (Exit) -- 102/60 106/56     Heart Rate (Admit) 1.67 bpm 67 bpm 75 bpm     Heart Rate (Exercise) 63 bpm 86 bpm 88 bpm     Heart Rate (Exit) 89 bpm 67 bpm 81 bpm     Oxygen Saturation (Admit) 64 % 96 % 94 %     Oxygen Saturation (Exercise) 95 % 92 % 90 %     Oxygen Saturation (Exit) 93 % 97 % 95 %     Rating of Perceived Exertion (Exercise) 96 11 11     Perceived Dyspnea (Exercise) 12 1 0     Symptoms 1 -- --     Duration Continue with 30 min of aerobic exercise without signs/symptoms of physical distress. Continue with 30 min of aerobic exercise without signs/symptoms of physical distress. Continue with 30 min of aerobic exercise without signs/symptoms of physical distress.     Intensity Other (comment) THRR unchanged THRR unchanged       Progression   Progression Continue to progress workloads to maintain intensity without signs/symptoms of physical distress. Continue to progress workloads to maintain intensity without signs/symptoms of physical distress. Continue to progress workloads to maintain intensity without signs/symptoms of physical distress.       Resistance Training   Training Prescription Yes Yes Yes     Weight Red bands Blue bands Blue bands     Reps 10-15 10-15 10-15     Time 10 Minutes 10 Minutes 10 Minutes       Recumbant Bike   Level 2.4 3 3.5     Minutes 15 15 15  METs 2.2 2.5 4.6       NuStep   Level $Remo'1 3 3     'PsemM$ SPM 70 80 80     Minutes $Remove'15 30 15     'QfyhUAj$ METs 1.8 2.3 2.1              Exercise Comments:   Exercise Comments     Row Name 09/18/21 1217           Exercise Comments Pt completed first day of exercise. Aaditya exercised 15 min on NuStep and recumbent bike. He tolerated both exercise modes well. Ardith exercised at a higher  level than his initial EXRX on the recumbent bike. He performed the warm up and cool down standing. He had slight hip pain during the cool down and sat down for 1 min.                Exercise Goals and Review:   Exercise Goals     Row Name 09/10/21 1128 09/18/21 1503 10/15/21 0834         Exercise Goals   Increase Physical Activity Yes Yes Yes     Intervention Provide advice, education, support and counseling about physical activity/exercise needs.;Develop an individualized exercise prescription for aerobic and resistive training based on initial evaluation findings, risk stratification, comorbidities and participant's personal goals. Provide advice, education, support and counseling about physical activity/exercise needs.;Develop an individualized exercise prescription for aerobic and resistive training based on initial evaluation findings, risk stratification, comorbidities and participant's personal goals. Provide advice, education, support and counseling about physical activity/exercise needs.;Develop an individualized exercise prescription for aerobic and resistive training based on initial evaluation findings, risk stratification, comorbidities and participant's personal goals.     Expected Outcomes Short Term: Attend rehab on a regular basis to increase amount of physical activity.;Long Term: Add in home exercise to make exercise part of routine and to increase amount of physical activity.;Long Term: Exercising regularly at least 3-5 days a week. Short Term: Attend rehab on a regular basis to increase amount of physical activity.;Long Term: Add in home exercise to make exercise part of routine and to increase amount of physical activity.;Long Term: Exercising regularly at least 3-5 days a week. Short Term: Attend rehab on a regular basis to increase amount of physical activity.;Long Term: Add in home exercise to make exercise part of routine and to increase amount of physical activity.;Long  Term: Exercising regularly at least 3-5 days a week.     Increase Strength and Stamina Yes Yes Yes     Intervention Provide advice, education, support and counseling about physical activity/exercise needs.;Develop an individualized exercise prescription for aerobic and resistive training based on initial evaluation findings, risk stratification, comorbidities and participant's personal goals. Provide advice, education, support and counseling about physical activity/exercise needs.;Develop an individualized exercise prescription for aerobic and resistive training based on initial evaluation findings, risk stratification, comorbidities and participant's personal goals. Provide advice, education, support and counseling about physical activity/exercise needs.;Develop an individualized exercise prescription for aerobic and resistive training based on initial evaluation findings, risk stratification, comorbidities and participant's personal goals.     Expected Outcomes Short Term: Increase workloads from initial exercise prescription for resistance, speed, and METs.;Short Term: Perform resistance training exercises routinely during rehab and add in resistance training at home;Long Term: Improve cardiorespiratory fitness, muscular endurance and strength as measured by increased METs and functional capacity (6MWT) Short Term: Increase workloads from initial exercise prescription for resistance, speed, and METs.;Short Term: Perform resistance training exercises routinely  during rehab and add in resistance training at home;Long Term: Improve cardiorespiratory fitness, muscular endurance and strength as measured by increased METs and functional capacity (6MWT) Short Term: Increase workloads from initial exercise prescription for resistance, speed, and METs.;Short Term: Perform resistance training exercises routinely during rehab and add in resistance training at home;Long Term: Improve cardiorespiratory fitness, muscular  endurance and strength as measured by increased METs and functional capacity (6MWT)     Able to understand and use rate of perceived exertion (RPE) scale Yes Yes Yes     Intervention Provide education and explanation on how to use RPE scale Provide education and explanation on how to use RPE scale Provide education and explanation on how to use RPE scale     Expected Outcomes Short Term: Able to use RPE daily in rehab to express subjective intensity level;Long Term:  Able to use RPE to guide intensity level when exercising independently Short Term: Able to use RPE daily in rehab to express subjective intensity level;Long Term:  Able to use RPE to guide intensity level when exercising independently Short Term: Able to use RPE daily in rehab to express subjective intensity level;Long Term:  Able to use RPE to guide intensity level when exercising independently     Able to understand and use Dyspnea scale Yes Yes Yes     Intervention Provide education and explanation on how to use Dyspnea scale Provide education and explanation on how to use Dyspnea scale Provide education and explanation on how to use Dyspnea scale     Expected Outcomes Short Term: Able to use Dyspnea scale daily in rehab to express subjective sense of shortness of breath during exertion;Long Term: Able to use Dyspnea scale to guide intensity level when exercising independently Short Term: Able to use Dyspnea scale daily in rehab to express subjective sense of shortness of breath during exertion;Long Term: Able to use Dyspnea scale to guide intensity level when exercising independently Short Term: Able to use Dyspnea scale daily in rehab to express subjective sense of shortness of breath during exertion;Long Term: Able to use Dyspnea scale to guide intensity level when exercising independently     Knowledge and understanding of Target Heart Rate Range (THRR) Yes Yes Yes     Intervention Provide education and explanation of THRR including how  the numbers were predicted and where they are located for reference Provide education and explanation of THRR including how the numbers were predicted and where they are located for reference Provide education and explanation of THRR including how the numbers were predicted and where they are located for reference     Expected Outcomes Short Term: Able to state/look up THRR;Short Term: Able to use daily as guideline for intensity in rehab;Long Term: Able to use THRR to govern intensity when exercising independently Short Term: Able to state/look up THRR;Short Term: Able to use daily as guideline for intensity in rehab;Long Term: Able to use THRR to govern intensity when exercising independently Short Term: Able to state/look up THRR;Short Term: Able to use daily as guideline for intensity in rehab;Long Term: Able to use THRR to govern intensity when exercising independently     Understanding of Exercise Prescription Yes Yes Yes     Intervention Provide education, explanation, and written materials on patient's individual exercise prescription Provide education, explanation, and written materials on patient's individual exercise prescription Provide education, explanation, and written materials on patient's individual exercise prescription     Expected Outcomes Long Term: Able to explain home exercise  prescription to exercise independently;Short Term: Able to explain program exercise prescription Long Term: Able to explain home exercise prescription to exercise independently;Short Term: Able to explain program exercise prescription Long Term: Able to explain home exercise prescription to exercise independently;Short Term: Able to explain program exercise prescription              Exercise Goals Re-Evaluation :  Exercise Goals Re-Evaluation     [place] Name 09/17/21 0939 09/18/21 1504 10/15/21 0835         Exercise Goal Re-Evaluation   Exercise Goals Review Increase Physical Activity;Increase Strength  and Stamina;Able to understand and use rate of perceived exertion (RPE) scale;Able to understand and use Dyspnea scale;Knowledge and understanding of Target Heart Rate Range (THRR);Understanding of Exercise Prescription Increase Physical Activity;Increase Strength and Stamina;Able to understand and use rate of perceived exertion (RPE) scale;Able to understand and use Dyspnea scale;Knowledge and understanding of Target Heart Rate Range (THRR);Understanding of Exercise Prescription Increase Physical Activity;Increase Strength and Stamina;Able to understand and use rate of perceived exertion (RPE) scale;Able to understand and use Dyspnea scale;Knowledge and understanding of Target Heart Rate Range (THRR);Understanding of Exercise Prescription     Comments Deovion is scheduled to begin exercising this week. Will monitor and progress as able. Sundiata has completed 1 exercise session. He exercised for 15 min on the NuStep and recumbent bike. He increased his level on the recumbent bike from initial EXRX. He tolerated both exercise modes well and performed the warm up and cool down standing. It is too soon to note any discernable progressions. Will continue to monitor and progress as able. Hulan has completed 7 exercise sessions. He continues to progress and tolerate progressions well. He exercises 15 min on the Nustep and recumbent bike. He averages 2.4 METs at level 3 on the Nustep and 3.7 METs at level 3 on the recumbent bike. He performs the warmup and cooldown standing. I have increased the resistance for his resistance band. Diana is very motivated to lose weight, so he exercises on a regular basis. We have discussed his home exercise briefly. He attends the [facility] at least 3x/wk. He will bike, walk, or swim. Will continue to monitor and progress as able.     Expected Outcomes Through exercise at rehab and home the patient will decrease shortness of breath with daily activities and feel confident in carrying out an  exercise regimen at home Through exercise at rehab and home the patient will decrease shortness of breath with daily activities and feel confident in carrying out an exercise regimen at home Through exercise at rehab and home the patient will decrease shortness of breath with daily activities and feel confident in carrying out an exercise regimen at home              Discharge Exercise Prescription (Final Exercise Prescription Changes):  Exercise Prescription Changes - 10/16/21 1500       Response to Exercise   Blood Pressure (Admit) 114/56    Blood Pressure (Exercise) 132/60    Blood Pressure (Exit) 106/56    Heart Rate (Admit) 75 bpm    Heart Rate (Exercise) 88 bpm    Heart Rate (Exit) 81 bpm    Oxygen Saturation (Admit) 94 %    Oxygen Saturation (Exercise) 90 %    Oxygen Saturation (Exit) 95 %    Rating of Perceived Exertion (Exercise) 11    Perceived Dyspnea (Exercise) 0    Duration Continue with 30 min of aerobic exercise without signs/symptoms of physical  distress.    Intensity THRR unchanged      Progression   Progression Continue to progress workloads to maintain intensity without signs/symptoms of physical distress.      Resistance Training   Training Prescription Yes    Weight Blue bands    Reps 10-15    Time 10 Minutes      Recumbant Bike   Level 3.5    Minutes 15    METs 4.6      NuStep   Level 3    SPM 80    Minutes 15    METs 2.1             Nutrition:  Target Goals: Understanding of nutrition guidelines, daily intake of sodium '1500mg'$ , cholesterol '200mg'$ , calories 30% from fat and 7% or less from saturated fats, daily to have 5 or more servings of fruits and vegetables.  Biometrics:  Pre Biometrics - 09/10/21 1005       Pre Biometrics   Height $Remov'5\' 8"'VaxXNM$  (1.727 m)    Weight 111.4 kg    BMI (Calculated) 37.35    Grip Strength 28 kg              Nutrition Therapy Plan and Nutrition Goals:   Nutrition Assessments:  MEDIFICTS Score  Key: ?70 Need to make dietary changes  40-70 Heart Healthy Diet ? 40 Therapeutic Level Cholesterol Diet   Picture Your Plate Scores: <03 Unhealthy dietary pattern with much room for improvement. 41-50 Dietary pattern unlikely to meet recommendations for good health and room for improvement. 51-60 More healthful dietary pattern, with some room for improvement.  >60 Healthy dietary pattern, although there may be some specific behaviors that could be improved.    Nutrition Goals Re-Evaluation:   Nutrition Goals Discharge (Final Nutrition Goals Re-Evaluation):   Psychosocial: Target Goals: Acknowledge presence or absence of significant depression and/or stress, maximize coping skills, provide positive support system. Participant is able to verbalize types and ability to use techniques and skills needed for reducing stress and depression.  Initial Review & Psychosocial Screening:  Initial Psych Review & Screening - 09/18/21 0929       Initial Review   Current issues with None Identified   Elgar has bipolar disorder and is not taking his medications due to side effects from the medications.  He continues to talk with a psychiatrist at the [place] on a every 3 month cycle.     Family Dynamics   Good Support System? Yes      Barriers   Psychosocial barriers to participate in program There are no identifiable barriers or psychosocial needs.      Screening Interventions   Interventions Encouraged to exercise             Quality of Life Scores:  Scores of 19 and below usually indicate a poorer quality of life in these areas.  A difference of  2-3 points is a clinically meaningful difference.  A difference of 2-3 points in the total score of the Quality of Life Index has been associated with significant improvement in overall quality of life, self-image, physical symptoms, and general health in studies assessing change in quality of life.  PHQ-9: Recent Review Flowsheet Data      Depression screen [facility] 2/9 09/10/2021 09/10/2021 04/13/2014   Decreased Interest 0 0 3   Down, Depressed, Hopeless 0 0 3   PHQ - 2 Score 0 0 6   Altered sleeping 1  - 3   Tired,  decreased energy 0 - 2   Change in appetite 0 - 2   Feeling bad or failure about yourself  0 - 3   Trouble concentrating 1  - 0   Moving slowly or fidgety/restless 1  - 2   Suicidal thoughts 0 - 1   PHQ-9 Score 3 - 19   Difficult doing work/chores Somewhat difficult - -      Interpretation of Total Score  Total Score Depression Severity:  1-4 = Minimal depression, 5-9 = Mild depression, 10-14 = Moderate depression, 15-19 = Moderately severe depression, 20-27 = Severe depression   Psychosocial Evaluation and Intervention:  Psychosocial Evaluation - 09/10/21 1006       Psychosocial Evaluation & Interventions   Interventions --   Has psychiatrist   Continue Psychosocial Services  Follow up required by staff             Psychosocial Re-Evaluation:  Psychosocial Re-Evaluation     Bedias Name 09/18/21 0938 10/09/21 1045 10/09/21 1155 10/16/21 1408       Psychosocial Re-Evaluation   Current issues with -- None Identified -- None Identified    Comments No change in psychosocial status since orientation. Deronte has been able to participate in PR with no psychosocial barriers. He has been able to continue to manage his health concerns and stress in healthy ways. He denies any current issuses with depression, stress anxiety, or sleep concerns. Rorey has been able to participate in PR without any identifiable barriers or psychosocial issues.His attendance has been excellent and he has been enjoying exercising at PR. He has increased his work loads on the nu step and recubent bike without any issues.     Expected Outcomes -- That Mable will continue to be able to participate in PR with no psychosocial barriers or concerns. He will be able to continue to manage his health concerns and stress in healthy ways. -- That  Read will continue to participate in PR with no psychosocial barriers.He will continue to make progress with his exercise.    Interventions -- Encouraged to attend Cardiac Rehabilitation for the exercise;Encouraged to attend Pulmonary Rehabilitation for the exercise -- Encouraged to attend Cardiac Rehabilitation for the exercise;Encouraged to attend Pulmonary Rehabilitation for the exercise  Jeffie is being seen by a psychiatrist.He is unable to take medications for his Bipolar due to side effects.    Continue Psychosocial Services  -- No Follow up required -- Follow up required by staff             Psychosocial Discharge (Final Psychosocial Re-Evaluation):  Psychosocial Re-Evaluation - 10/16/21 1408       Psychosocial Re-Evaluation   Current issues with None Identified    Comments Nettie has been able to participate in PR without any identifiable barriers or psychosocial issues.His attendance has been excellent and he has been enjoying exercising at PR. He has increased his work loads on the nu step and recubent bike without any issues.     Expected Outcomes That Rithwik will continue to participate in PR with no psychosocial barriers.He will continue to make progress with his exercise.    Interventions Encouraged to attend Cardiac Rehabilitation for the exercise;Encouraged to attend Pulmonary Rehabilitation for the exercise   Evon is being seen by a psychiatrist.He is unable to take medications for his Bipolar due to side effects.   Continue Psychosocial Services  Follow up required by staff             Education: Education Goals:  Education classes will be provided on a weekly basis, covering required topics. Participant will state understanding/return demonstration of topics presented.  Learning Barriers/Preferences:  Learning Barriers/Preferences - 09/10/21 1008       Learning Barriers/Preferences   Learning Barriers None    Learning Preferences Audio;Computer/Internet;Group  Instruction;Individual Instruction;Pictoral;Skilled Demonstration;Verbal Instruction;Video;Written Material             Education Topics: Risk Factor Reduction:  -Group instruction that is supported by a PowerPoint presentation. Instructor discusses the definition of a risk factor, different risk factors for pulmonary disease, and how the heart and lungs work together.   Flowsheet Row PULMONARY REHAB CHRONIC OBSTRUCTIVE PULMONARY DISEASE from 10/18/2021 in [place]  Date 10/18/21  Educator Remo Lipps  [Handout]       Nutrition for Pulmonary Patient:  -Group instruction provided by PowerPoint slides, verbal discussion, and written materials to support subject matter. The instructor gives an explanation and review of healthy diet recommendations, which includes a discussion on weight management, recommendations for fruit and vegetable consumption, as well as protein, fluid, caffeine, fiber, sodium, sugar, and alcohol. Tips for eating when patients are short of breath are discussed.   Pursed Lip Breathing:  -Group instruction that is supported by demonstration and informational handouts. Instructor discusses the benefits of pursed lip and diaphragmatic breathing and detailed demonstration on how to preform both.     Oxygen Safety:  -Group instruction provided by PowerPoint, verbal discussion, and written material to support subject matter. There is an overview of "What is Oxygen" and "Why do we need it".  Instructor also reviews how to create a safe environment for oxygen use, the importance of using oxygen as prescribed, and the risks of noncompliance. There is a brief discussion on traveling with oxygen and resources the patient may utilize. Flowsheet Row PULMONARY REHAB CHRONIC OBSTRUCTIVE PULMONARY DISEASE from 10/18/2021 in [place]  Date 10/04/21  Educator Donnetta Simpers  [Handout]       Oxygen Equipment:  -Group instruction  provided by [facility] Staff utilizing handouts, written materials, and equipment demonstrations. Flowsheet Row PULMONARY REHAB CHRONIC OBSTRUCTIVE PULMONARY DISEASE from 10/18/2021 in [place]  Date 09/27/21  Educator Donnetta Simpers  [Handout]       Signs and Symptoms:  -Group instruction provided by written material and verbal discussion to support subject matter. Warning signs and symptoms of infection, stroke, and heart attack are reviewed and when to call the physician/911 reinforced. Tips for preventing the spread of infection discussed.   Advanced Directives:  -Group instruction provided by verbal instruction and written material to support subject matter. Instructor reviews Advanced Directive laws and proper instruction for filling out document.   Pulmonary Video:  -Group video education that reviews the importance of medication and oxygen compliance, exercise, good nutrition, pulmonary hygiene, and pursed lip and diaphragmatic breathing for the pulmonary patient.   Exercise for the Pulmonary Patient:  -Group instruction that is supported by a PowerPoint presentation. Instructor discusses benefits of exercise, core components of exercise, frequency, duration, and intensity of an exercise routine, importance of utilizing pulse oximetry during exercise, safety while exercising, and options of places to exercise outside of rehab.     Pulmonary Medications:  -Verbally interactive group education provided by instructor with focus on inhaled medications and proper administration.   Anatomy and Physiology of the Respiratory System and Intimacy:  -Group instruction provided by PowerPoint, verbal discussion, and written material to support subject matter. Instructor reviews  respiratory cycle and anatomical components of the respiratory system and their functions. Instructor also reviews differences in obstructive and restrictive respiratory diseases with examples  of each. Intimacy, Sex, and Sexuality differences are reviewed with a discussion on how relationships can change when diagnosed with pulmonary disease. Common sexual concerns are reviewed. Flowsheet Row PULMONARY REHAB CHRONIC OBSTRUCTIVE PULMONARY DISEASE from 10/18/2021 in [place]  Date 09/20/21  Educator Donnetta Simpers  Instruction Review Code 1- Rosezena Sensor Understanding       MD DAY -A group question and answer session with a medical doctor that allows participants to ask questions that relate to their pulmonary disease state.   OTHER EDUCATION -Group or individual verbal, written, or video instructions that support the educational goals of the pulmonary rehab program.   Holiday Eating Survival Tips:  -Group instruction provided by PowerPoint slides, verbal discussion, and written materials to support subject matter. The instructor gives patients tips, tricks, and techniques to help them not only survive but enjoy the holidays despite the onslaught of food that accompanies the holidays.   Knowledge Questionnaire Score:  Knowledge Questionnaire Score - 09/10/21 1013       Knowledge Questionnaire Score   Pre Score 15/18             Core Components/Risk Factors/Patient Goals at Admission:  Personal Goals and Risk Factors at Admission - 09/10/21 1009       Core Components/Risk Factors/Patient Goals on Admission   Improve shortness of breath with ADL's Yes    Intervention Provide education, individualized exercise plan and daily activity instruction to help decrease symptoms of SOB with activities of daily living.    Expected Outcomes Short Term: Improve cardiorespiratory fitness to achieve a reduction of symptoms when performing ADLs;Long Term: Be able to perform more ADLs without symptoms or delay the onset of symptoms             Core Components/Risk Factors/Patient Goals Review:   Goals and Risk Factor Review     Row Name 09/18/21 2440  10/09/21 1056 10/16/21 1428         Core Components/Risk Factors/Patient Goals Review   Personal Goals Review Increase knowledge of respiratory medications and ability to use respiratory devices properly.;Improve shortness of breath with ADL's;Develop more efficient breathing techniques such as purse lipped breathing and diaphragmatic breathing and practicing self-pacing with activity. Improve shortness of breath with ADL's;Develop more efficient breathing techniques such as purse lipped breathing and diaphragmatic breathing and practicing self-pacing with activity.;Increase knowledge of respiratory medications and ability to use respiratory devices properly. Improve shortness of breath with ADL's;Develop more efficient breathing techniques such as purse lipped breathing and diaphragmatic breathing and practicing self-pacing with activity.;Increase knowledge of respiratory medications and ability to use respiratory devices properly.     Review Jasani begins exercise sessions today, too early to have met program goals. Herbert has been participating in the [employer] with no issuses. He has been able to increase his workloads and METS levels on the nustep and recumbent bike.He voices that he has been satisfied with his progress and that he feels that he is getting stronger and his overall health is improving. Isaias has has dropped his weight from 111.1 kg to 108.5 kg since starting PR, which is something that was a goal he had.  He has been able to increase his work loads on the exercise equipment with reports of none to mild shortness of breath. He states that he is enjoying PR and the  exercise.     Expected Outcomes See admission goals. see initial core components See initial core components.              Core Components/Risk Factors/Patient Goals at Discharge (Final Review):   Goals and Risk Factor Review - 10/16/21 1428       Core Components/Risk Factors/Patient Goals Review   Personal Goals  Review Improve shortness of breath with ADL's;Develop more efficient breathing techniques such as purse lipped breathing and diaphragmatic breathing and practicing self-pacing with activity.;Increase knowledge of respiratory medications and ability to use respiratory devices properly.    Review Thelonious has has dropped his weight from 111.1 kg to 108.5 kg since starting PR, which is something that was a goal he had.  He has been able to increase his work loads on the exercise equipment with reports of none to mild shortness of breath. He states that he is enjoying PR and the exercise.    Expected Outcomes See initial core components.             ITP Comments:   Comments: ITP REVIEW Pt is making expected progress toward pulmonary rehab goals after completing 9 sessions. Recommend continued exercise, life style modification, education, and utilization of breathing techniques to increase stamina and strength and decrease shortness of breath with exertion.  Dr. Rodman Pickle is Medical Director for Pulmonary Rehab at [facility].

## 2021-10-23 NOTE — Progress Notes (Addendum)
Home Exercise Prescription I have reviewed a Home Exercise Prescription with [name]. Jose Patrick is currently exercising at the [facility]. He really enjoys going to the [facility] as this is his time to relax. Jose Patrick exercises 3 non-rehab days/wk for 1-2 hrs/day at the [facility]. He has walked the track or done water aerobics. I mentioned to him possibly decreasing his volume since he is getting fluid around his knees and having knee pain. I recommended exercising for 30-60 min/day for 3 non-rehab days/wk. I said he could take as many rest breaks as needed. Jose Patrick and I discussed how to progress their exercise prescription. Jose Patrick stated that they understand the exercise prescription.  The patient stated that their goals were to lost weight. His goal weight is 200 lbs. I discussed healthy weight loss with him. We reviewed exercise guidelines, target heart rate during exercise, RPE Scale, weather conditions, endpoints for exercise, warmup and cool down. The patient is encouraged to come to me with any questions. I will continue to follow up with the patient to assist them with progression and safety.    Sheppard Plumber, MS, ACSM-CEP 10/23/2021 3:21 PM

## 2021-10-25 ENCOUNTER — Other Ambulatory Visit: Payer: Self-pay

## 2021-10-25 ENCOUNTER — Encounter (HOSPITAL_COMMUNITY)
Admission: RE | Admit: 2021-10-25 | Discharge: 2021-10-25 | Disposition: A | Discharge status: Home / Self Care | Payer: No Typology Code available for payment source | Source: Ambulatory Visit | Attending: Pulmonary Disease | Admitting: Pulmonary Disease

## 2021-10-25 DIAGNOSIS — J449 Chronic obstructive pulmonary disease, unspecified: Secondary | ICD-10-CM

## 2021-10-25 NOTE — Progress Notes (Signed)
Daily Session Note  Patient Details  Name: Jose Patrick MRN: [id number] Date of Birth: July 28, 1951 Referring Provider:   April Manson Pulmonary Rehab Walk Test from 09/10/2021 in [place]  Referring Provider Loanne Drilling  [[place]]       Encounter Date: 10/25/2021  Check In:  Session Check In - 10/25/21 1126       Check-In   Supervising physician immediately available to respond to emergencies [facility] immediately available    Physician(s) Dr. Broadus John    Location [facility]    Staff Present Rodney Langton, Cathleen Fears, MS, ACSM-CEP, Exercise Physiologist    Virtual Visit No    Medication changes reported     No    Fall or balance concerns reported    No    Tobacco Cessation No Change    Warm-up and Cool-down Performed as group-led instruction    Resistance Training Performed Yes    VAD Patient? No    PAD/SET Patient? No      Pain Assessment   Currently in Pain? Yes    Pain Score 6     Pain Location Knee    Pain Orientation Right    Pain Descriptors / Indicators Aching    Pain Type Chronic pain    Pain Onset More than a month ago    Pain Frequency Intermittent    Multiple Pain Sites No             Capillary Blood Glucose: No results found for this or any previous visit (from the past 24 hour(s)).    Social History   Tobacco Use  Smoking Status Former   Packs/day: 1.50   Years: 41.00   Pack years: 61.50   Types: Cigarettes   Quit date: 2013   Years since quitting: 9.9  Smokeless Tobacco Never    Goals Met:  Proper associated with RPD/PD & O2 Sat Independence with exercise equipment Exercise tolerated well No report of concerns or symptoms today Strength training completed today  Goals Unmet:  Not Applicable  Comments: Service time is from 1017 to 1135.    Dr. Rodman Pickle is Medical Director for Pulmonary Rehab at [facility].

## 2021-10-26 NOTE — Progress Notes (Signed)
Patient previously followed by Dr. Gwenette Greet at the [facility] who is no longer actively practicing. Patient reports no recent PFTs and has not seen Pulmonary recently. I have advised patient to seek referral for Pulmonary from his PCP.

## 2021-10-30 ENCOUNTER — Other Ambulatory Visit: Payer: Self-pay

## 2021-10-30 ENCOUNTER — Encounter (HOSPITAL_COMMUNITY)
Admission: RE | Admit: 2021-10-30 | Discharge: 2021-10-30 | Disposition: A | Discharge status: Home / Self Care | Payer: No Typology Code available for payment source | Source: Ambulatory Visit | Attending: Pulmonary Disease | Admitting: Pulmonary Disease

## 2021-10-30 VITALS — Wt 240.5 lb

## 2021-10-30 DIAGNOSIS — J449 Chronic obstructive pulmonary disease, unspecified: Secondary | ICD-10-CM

## 2021-10-30 NOTE — Progress Notes (Signed)
Daily Session Note  Patient Details  Name: Jose Patrick MRN: [id number] Date of Birth: 05/24/1951 Referring Provider:   April Manson Pulmonary Rehab Walk Test from 09/10/2021 in [place]  Referring Provider Loanne Drilling  [[facility]]       Encounter Date: 10/30/2021  Check In:  Session Check In - 10/30/21 1125       Check-In   Supervising physician immediately available to respond to emergencies [facility] immediately available    Physician(s) Dr. Broadus John    Location [facility]    Staff Present Seward Carol, MS, ACSM CEP, Exercise Physiologist;Kaylee Rosana Hoes, MS, ACSM-CEP, Exercise Physiologist;Cristian Grieves Ysidro Evert, RN    Virtual Visit No    Medication changes reported     No    Fall or balance concerns reported    No    Tobacco Cessation No Change    Warm-up and Cool-down Performed as group-led instruction    Resistance Training Performed Yes    VAD Patient? No      Pain Assessment   Currently in Pain? No/denies    Multiple Pain Sites No             Capillary Blood Glucose: No results found for this or any previous visit (from the past 24 hour(s)).  POCT Glucose - 10/30/21 1203       POCT Blood Glucose   Pre-Exercise 111 mg/dL    Post-Exercise 139 mg/dL             Exercise Prescription Changes - 10/30/21 1200       Response to Exercise   Blood Pressure (Admit) 114/58    Blood Pressure (Exercise) 142/82    Blood Pressure (Exit) 108/50    Heart Rate (Admit) 62 bpm    Heart Rate (Exercise) 88 bpm    Heart Rate (Exit) 61 bpm    Oxygen Saturation (Admit) 99 %    Oxygen Saturation (Exercise) 94 %    Oxygen Saturation (Exit) 97 %    Rating of Perceived Exertion (Exercise) 11    Perceived Dyspnea (Exercise) 1    Duration Continue with 30 min of aerobic exercise without signs/symptoms of physical distress.    Intensity THRR unchanged      Progression   Progression Continue to progress workloads to maintain  intensity without signs/symptoms of physical distress.      Resistance Training   Training Prescription Yes    Weight Blue bands    Reps 10-15    Time 10 Minutes      Recumbant Bike   Level 4    Minutes 15    METs 2.7      NuStep   Level 4    SPM 80    Minutes 15    METs 3             Social History   Tobacco Use  Smoking Status Former   Packs/day: 1.50   Years: 41.00   Pack years: 61.50   Types: Cigarettes   Quit date: 2013   Years since quitting: 9.9  Smokeless Tobacco Never    Goals Met:  Exercise tolerated well No report of concerns or symptoms today Strength training completed today  Goals Unmet:  Not Applicable  Comments: Service time is from 1015 to [place]    Dr. Rodman Pickle is Medical Director for Pulmonary Rehab at [facility].

## 2021-11-01 ENCOUNTER — Other Ambulatory Visit: Payer: Self-pay

## 2021-11-01 ENCOUNTER — Encounter (HOSPITAL_COMMUNITY)
Admission: RE | Admit: 2021-11-01 | Discharge: 2021-11-01 | Disposition: A | Discharge status: Home / Self Care | Payer: No Typology Code available for payment source | Source: Ambulatory Visit | Attending: Pulmonary Disease | Admitting: Pulmonary Disease

## 2021-11-01 DIAGNOSIS — J449 Chronic obstructive pulmonary disease, unspecified: Secondary | ICD-10-CM

## 2021-11-01 NOTE — Progress Notes (Signed)
Daily Session Note  Patient Details  Name: Jose Patrick MRN: [id number] Date of Birth: 1951/09/01 Referring Provider:   April Manson Pulmonary Rehab Walk Test from 09/10/2021 in [place]  Referring Provider Loanne Drilling  [[facility]]       Encounter Date: 11/01/2021  Check In:  Session Check In - 11/01/21 1132       Check-In   Supervising physician immediately available to respond to emergencies [facility] immediately available    Physician(s) Dr. Maren Beach    Location [facility]    Staff Present Trish Fountain, RN, BSN;Yanin Muhlestein Ysidro Evert, Cathleen Fears, MS, ACSM-CEP, Exercise Physiologist;David Lilyan Punt, MS, ACSM-CEP, CCRP, Exercise Physiologist    Virtual Visit No    Medication changes reported     No    Fall or balance concerns reported    No    Tobacco Cessation No Change    Warm-up and Cool-down Performed as group-led instruction    Resistance Training Performed Yes    VAD Patient? No    PAD/SET Patient? No      Pain Assessment   Currently in Pain? No/denies    Multiple Pain Sites No             Capillary Blood Glucose: No results found for this or any previous visit (from the past 24 hour(s)).    Social History   Tobacco Use  Smoking Status Former   Packs/day: 1.50   Years: 41.00   Pack years: 61.50   Types: Cigarettes   Quit date: 2013   Years since quitting: 9.9  Smokeless Tobacco Never    Goals Met:  Exercise tolerated well No report of concerns or symptoms today Strength training completed today  Goals Unmet:  Not Applicable  Comments: Service time is from 1030 to [place]    Dr. Rodman Pickle is Medical Director for Pulmonary Rehab at [facility].

## 2021-11-06 ENCOUNTER — Other Ambulatory Visit: Payer: Self-pay

## 2021-11-06 ENCOUNTER — Encounter (HOSPITAL_COMMUNITY)
Admission: RE | Admit: 2021-11-06 | Discharge: 2021-11-06 | Disposition: A | Discharge status: Home / Self Care | Payer: No Typology Code available for payment source | Source: Ambulatory Visit | Attending: Pulmonary Disease | Admitting: Pulmonary Disease

## 2021-11-06 DIAGNOSIS — J449 Chronic obstructive pulmonary disease, unspecified: Secondary | ICD-10-CM

## 2021-11-06 NOTE — Progress Notes (Signed)
Daily Session Note  Patient Details  Name: Jose Patrick MRN: [id number] Date of Birth: Apr 18, 1951 Referring Provider:   April Manson Pulmonary Rehab Walk Test from 09/10/2021 in [place]  Referring Provider Loanne Drilling  [[facility]]       Encounter Date: 11/06/2021  Check In:  Session Check In - 11/06/21 1124       Check-In   Supervising physician immediately available to respond to emergencies [facility] immediately available    Physician(s) Dr. Maren Beach    [facility]    Staff Present Rodney Langton, RN;David Lilyan Punt, MS, ACSM-CEP, CCRP, Exercise Physiologist;Kaylee Rosana Hoes, MS, ACSM-CEP, Exercise Physiologist    Virtual Visit No    Medication changes reported     No    Fall or balance concerns reported    No    Tobacco Cessation No Change    Warm-up and Cool-down Performed as group-led instruction    Resistance Training Performed Yes    VAD Patient? No    PAD/SET Patient? No      Pain Assessment   Currently in Pain? No/denies    Multiple Pain Sites No             Capillary Blood Glucose: No results found for this or any previous visit (from the past 24 hour(s)).    Social History   Tobacco Use  Smoking Status Former   Packs/day: 1.50   Years: 41.00   Pack years: 61.50   Types: Cigarettes   Quit date: 2013   Years since quitting: 9.9  Smokeless Tobacco Never    Goals Met:  Exercise tolerated well No report of concerns or symptoms today Strength training completed today  Goals Unmet:  Not Applicable.serv   Comments: Service time is from 1014 to 1140    Dr. Rodman Pickle is Medical Director for Pulmonary Rehab at [facility].

## 2021-11-08 ENCOUNTER — Other Ambulatory Visit: Payer: Self-pay

## 2021-11-08 ENCOUNTER — Encounter (HOSPITAL_COMMUNITY)
Admission: RE | Admit: 2021-11-08 | Discharge: 2021-11-08 | Disposition: A | Discharge status: Home / Self Care | Payer: No Typology Code available for payment source | Source: Ambulatory Visit | Attending: Pulmonary Disease | Admitting: Pulmonary Disease

## 2021-11-08 DIAGNOSIS — J449 Chronic obstructive pulmonary disease, unspecified: Secondary | ICD-10-CM | POA: Diagnosis not present

## 2021-11-08 NOTE — Progress Notes (Signed)
Daily Session Note  Patient Details  Name: Jose Patrick MRN: [id number] Date of Birth: 1951/05/15 Referring Provider:   April Manson Pulmonary Rehab Walk Test from 09/10/2021 in [place]  Referring Provider Loanne Drilling  [[facility]]       Encounter Date: 11/08/2021  Check In:  Session Check In - 11/08/21 1120       Check-In   Supervising physician immediately available to respond to emergencies [facility] immediately available    Physician(s) Dr. [place] Pink    Location [facility]    Staff Present Rosebud Poles, RN, BSN;Carlette Wilber Oliphant, RN, Quentin Ore, MS, ACSM-CEP, Exercise Physiologist;Marlynn Hinckley Ysidro Evert, RN    Virtual Visit No    Medication changes reported     No    Fall or balance concerns reported    No    Tobacco Cessation No Change    Warm-up and Cool-down Performed as group-led instruction    Resistance Training Performed Yes    VAD Patient? No    PAD/SET Patient? No      Pain Assessment   Currently in Pain? No/denies    Multiple Pain Sites No             Capillary Blood Glucose: No results found for this or any previous visit (from the past 24 hour(s)).    Social History   Tobacco Use  Smoking Status Former   Packs/day: 1.50   Years: 41.00   Pack years: 61.50   Types: Cigarettes   Quit date: 2013   Years since quitting: 9.9  Smokeless Tobacco Never    Goals Met:  Exercise tolerated well No report of concerns or symptoms today Strength training completed today  Goals Unmet:  Not Applicable  Comments: Service time is from 1022 to [place]    Dr. Rodman Pickle is Medical Director for Pulmonary Rehab at [facility].

## 2021-11-13 ENCOUNTER — Encounter (HOSPITAL_COMMUNITY)
Admission: RE | Admit: 2021-11-13 | Discharge: 2021-11-13 | Disposition: A | Discharge status: Home / Self Care | Payer: No Typology Code available for payment source | Source: Ambulatory Visit | Attending: Pulmonary Disease | Admitting: Pulmonary Disease

## 2021-11-13 ENCOUNTER — Other Ambulatory Visit: Payer: Self-pay

## 2021-11-13 VITALS — Wt 241.4 lb

## 2021-11-13 DIAGNOSIS — J449 Chronic obstructive pulmonary disease, unspecified: Secondary | ICD-10-CM

## 2021-11-13 NOTE — Progress Notes (Signed)
Daily Session Note  Patient Details  Name: Jose Patrick MRN: [id number] Date of Birth: [birth year] Referring Provider:   Doristine Devoid Pulmonary Rehab Walk Test from 09/10/2021 in [facility]  Referring Provider Everardo All  [[facility]]       Encounter Date: 11/13/2021  Check In:  Session Check In - 11/13/21 1039       Check-In   Supervising physician immediately available to respond to emergencies [facility] immediately available    Physician(s) Dr. Rito Ehrlich    [facility]    Staff Present Haskel Khan, RN, Doris Cheadle, MS, ACSM-CEP, Exercise Physiologist;Lisa Kizzie Bane, RN    Virtual Visit No    Medication changes reported     No    Fall or balance concerns reported    No    Tobacco Cessation No Change    Warm-up and Cool-down Performed as group-led instruction    Resistance Training Performed Yes    VAD Patient? No    PAD/SET Patient? No      Pain Assessment   Currently in Pain? No/denies    Multiple Pain Sites No             Capillary Blood Glucose: No results found for this or any previous visit (from the past 24 hour(s)).  POCT Glucose - 11/13/21 1220       POCT Blood Glucose   Pre-Exercise 164 mg/dL    Post-Exercise 841 mg/dL             Exercise Prescription Changes - 11/13/21 1200       Response to Exercise   Blood Pressure (Admit) 108/60    Blood Pressure (Exercise) 120/60    Blood Pressure (Exit) 102/52    Heart Rate (Admit) 66 bpm    Heart Rate (Exercise) 90 bpm    Heart Rate (Exit) 69 bpm    Oxygen Saturation (Admit) 96 %    Oxygen Saturation (Exercise) 91 %    Oxygen Saturation (Exit) 95 %    Rating of Perceived Exertion (Exercise) 11    Perceived Dyspnea (Exercise) 0    Duration Continue with 30 min of aerobic exercise without signs/symptoms of physical distress.    Intensity THRR unchanged      Progression   Progression Continue to progress workloads to maintain  intensity without signs/symptoms of physical distress.      Resistance Training   Training Prescription Yes    Weight Blue bands    Reps 10-15    Time 10 Minutes      Recumbant Bike   Level 5    Watts 51    Minutes 15      NuStep   Level 5    SPM 90    Minutes 15    METs 2.1             Social History   Tobacco Use  Smoking Status Former   Packs/day: 1.50   Years: 41.00   Pack years: 61.50   Types: Cigarettes   Quit date: 2013   Years since quitting: 9.9  Smokeless Tobacco Never    Goals Met:  Proper associated with RPD/PD & O2 Sat Independence with exercise equipment Exercise tolerated well No report of concerns or symptoms today Strength training completed today  Goals Unmet:  Not Applicable  Comments: Service time is from 1021 to 1141.    Dr. Mechele Collin is Medical Director for Pulmonary Rehab at [facility].

## 2021-11-14 ENCOUNTER — Ambulatory Visit: Payer: No Typology Code available for payment source | Admitting: Gastroenterology

## 2021-11-15 ENCOUNTER — Encounter (HOSPITAL_COMMUNITY): Payer: No Typology Code available for payment source

## 2021-11-20 ENCOUNTER — Other Ambulatory Visit: Payer: Self-pay

## 2021-11-20 ENCOUNTER — Encounter (HOSPITAL_COMMUNITY)
Admission: RE | Admit: 2021-11-20 | Discharge: 2021-11-20 | Disposition: A | Discharge status: Home / Self Care | Payer: No Typology Code available for payment source | Source: Ambulatory Visit | Attending: Pulmonary Disease | Admitting: Pulmonary Disease

## 2021-11-20 DIAGNOSIS — J449 Chronic obstructive pulmonary disease, unspecified: Secondary | ICD-10-CM | POA: Insufficient documentation

## 2021-11-20 NOTE — Progress Notes (Signed)
Discharge Progress Report  Patient Details  Name: Jose Patrick MRN: [id number] Date of Birth: [birth year] Referring Provider:   Doristine Devoid Pulmonary Rehab Walk Test from 09/10/2021 in [facility]  Referring Provider Everardo All  [[facility]]        Number of Visits: 17  Reason for Discharge:  Patient has met program and personal goals.  Smoking History:  Social History   Tobacco Use  Smoking Status Former   Packs/day: 1.50   Years: 41.00   Pack years: 61.50   Types: Cigarettes   Quit date: 2013   Years since quitting: 10.0  Smokeless Tobacco Never    Diagnosis:  Stage 2 moderate COPD by GOLD classification (HCC)  ADL UCSD:  Pulmonary Assessment Scores     Row Name 09/10/21 1014 11/06/21 1552 11/20/21 1222     ADL UCSD   ADL Phase Entry Exit --   SOB Score total 58 64 --     CAT Score   CAT Score 23 21 --     mMRC Score   mMRC Score 4 -- 4            Initial Exercise Prescription:  Initial Exercise Prescription - 09/10/21 1100       Date of Initial Exercise RX and Referring Provider   Date 09/10/21    Referring Provider Everardo All   [facility]   Expected Discharge Date 11/15/21      Recumbant Bike   Level 1    Minutes 15    METs 1      NuStep   Level 1    SPM 70    Minutes 15      Prescription Details   Frequency (times per week) 2    Duration Progress to 30 minutes of continuous aerobic without signs/symptoms of physical distress      Intensity   THRR 40-80% of Max Heartrate 60-120    Ratings of Perceived Exertion 11-13    Perceived Dyspnea 0-4      Progression   Progression Continue progressive overload as per policy without signs/symptoms or physical distress.      Resistance Training   Training Prescription Yes    Weight Red bands    Reps 10-15             Discharge Exercise Prescription (Final Exercise Prescription Changes):  Exercise Prescription Changes - 11/13/21 1200       Response to Exercise    Blood Pressure (Admit) 108/60    Blood Pressure (Exercise) 120/60    Blood Pressure (Exit) 102/52    Heart Rate (Admit) 66 bpm    Heart Rate (Exercise) 90 bpm    Heart Rate (Exit) 69 bpm    Oxygen Saturation (Admit) 96 %    Oxygen Saturation (Exercise) 91 %    Oxygen Saturation (Exit) 95 %    Rating of Perceived Exertion (Exercise) 11    Perceived Dyspnea (Exercise) 0    Duration Continue with 30 min of aerobic exercise without signs/symptoms of physical distress.    Intensity THRR unchanged      Progression   Progression Continue to progress workloads to maintain intensity without signs/symptoms of physical distress.      Resistance Training   Training Prescription Yes    Weight Blue bands    Reps 10-15    Time 10 Minutes      Recumbant Bike   Level 5    Watts 51    Minutes 15  NuStep   Level 5    SPM 90    Minutes 15    METs 2.1             Functional Capacity:  6 Minute Walk     Row Name 09/10/21 1121 11/20/21 1204       6 Minute Walk   Phase Initial Discharge    Distance 628 feet 1830 feet    Distance % Change -- 191.4 %    Distance Feet Change -- 1202 ft    Walk Time 6 minutes 6 minutes    # of Rest Breaks 0 0    MPH 1.19 3.47    METS 0.96 3.68    RPE 11 11    Perceived Dyspnea  1 1    VO2 Peak 3.37 12.88    Symptoms No No    Resting HR 74 bpm 77 bpm    Resting BP 130/60 100/40    Resting Oxygen Saturation  97 % 95 %    Exercise Oxygen Saturation  during 6 min walk 96 % 88 %    Max Ex. HR 79 bpm 128 bpm    Max Ex. BP 132/60 140/50    2 Minute Post BP 120/58 134/50      Interval HR   1 Minute HR 77 111    2 Minute HR 83 114    3 Minute HR 78 115    4 Minute HR 78 127    5 Minute HR 79 128    6 Minute HR 78 124    2 Minute Post HR 63 82    Interval Heart Rate? Yes Yes      Interval Oxygen   Interval Oxygen? Yes Yes    Baseline Oxygen Saturation % -- 95 %    1 Minute Oxygen Saturation % 97 % 91 %    1 Minute Liters of Oxygen 0  L 0 L    2 Minute Oxygen Saturation % 96 % 90 %    2 Minute Liters of Oxygen 0 L 0 L    3 Minute Oxygen Saturation % 97 % 88 %    3 Minute Liters of Oxygen 0 L 0 L    4 Minute Oxygen Saturation % 96 % 89 %    4 Minute Liters of Oxygen 0 L 0 L    5 Minute Oxygen Saturation % 96 % 90 %    5 Minute Liters of Oxygen 0 L 0 L    6 Minute Oxygen Saturation % 96 % 89 %    6 Minute Liters of Oxygen 0 L 0 L    2 Minute Post Oxygen Saturation % 96 % 95 %    2 Minute Post Liters of Oxygen 0 L 0 L             Psychological, QOL, Others - Outcomes: PHQ 2/9: Depression screen [facility] 2/9 11/20/2021 09/10/2021 09/10/2021 04/13/2014  Decreased Interest 0 0 0 3  Down, Depressed, Hopeless 0 0 0 3  PHQ - 2 Score 0 0 0 6  Altered sleeping 0 1 - 3  Tired, decreased energy 1 0 - 2  Change in appetite 0 0 - 2  Feeling bad or failure about yourself  0 0 - 3  Trouble concentrating 1 1 - 0  Moving slowly or fidgety/restless 0 1 - 2  Suicidal thoughts 0 0 - 1  PHQ-9 Score 2 3 - 19  Difficult doing work/chores -  Somewhat difficult - -    Quality of Life:   Personal Goals: Goals established at orientation with interventions provided to work toward goal.  Personal Goals and Risk Factors at Admission - 09/10/21 1009       Core Components/Risk Factors/Patient Goals on Admission   Improve shortness of breath with ADL's Yes    Intervention Provide education, individualized exercise plan and daily activity instruction to help decrease symptoms of SOB with activities of daily living.    Expected Outcomes Short Term: Improve cardiorespiratory fitness to achieve a reduction of symptoms when performing ADLs;Long Term: Be able to perform more ADLs without symptoms or delay the onset of symptoms              Personal Goals Discharge:  Goals and Risk Factor Review     Row Name 09/18/21 5027 10/09/21 1056 10/16/21 1428         Core Components/Risk Factors/Patient Goals Review   Personal Goals Review  Increase knowledge of respiratory medications and ability to use respiratory devices properly.;Improve shortness of breath with ADL's;Develop more efficient breathing techniques such as purse lipped breathing and diaphragmatic breathing and practicing self-pacing with activity. Improve shortness of breath with ADL's;Develop more efficient breathing techniques such as purse lipped breathing and diaphragmatic breathing and practicing self-pacing with activity.;Increase knowledge of respiratory medications and ability to use respiratory devices properly. Improve shortness of breath with ADL's;Develop more efficient breathing techniques such as purse lipped breathing and diaphragmatic breathing and practicing self-pacing with activity.;Increase knowledge of respiratory medications and ability to use respiratory devices properly.     Review Gio begins exercise sessions today, too early to have met program goals. Ayomide has been participating in the [employer] with no issuses. He has been able to increase his workloads and METS levels on the nustep and recumbent bike.He voices that he has been satisfied with his progress and that he feels that he is getting stronger and his overall health is improving. Tahje has has dropped his weight from 111.1 kg to 108.5 kg since starting PR, which is something that was a goal he had.  He has been able to increase his work loads on the exercise equipment with reports of none to mild shortness of breath. He states that he is enjoying PR and the exercise.     Expected Outcomes See admission goals. see initial core components See initial core components.              Exercise Goals and Review:  Exercise Goals     Row Name 09/10/21 1128 09/18/21 1503 10/15/21 0834         Exercise Goals   Increase Physical Activity Yes Yes Yes     Intervention Provide advice, education, support and counseling about physical activity/exercise needs.;Develop an individualized exercise  prescription for aerobic and resistive training based on initial evaluation findings, risk stratification, comorbidities and participant's personal goals. Provide advice, education, support and counseling about physical activity/exercise needs.;Develop an individualized exercise prescription for aerobic and resistive training based on initial evaluation findings, risk stratification, comorbidities and participant's personal goals. Provide advice, education, support and counseling about physical activity/exercise needs.;Develop an individualized exercise prescription for aerobic and resistive training based on initial evaluation findings, risk stratification, comorbidities and participant's personal goals.     Expected Outcomes Short Term: Attend rehab on a regular basis to increase amount of physical activity.;Long Term: Add in home exercise to make exercise part of routine and to increase amount of physical activity.;Long  Term: Exercising regularly at least 3-5 days a week. Short Term: Attend rehab on a regular basis to increase amount of physical activity.;Long Term: Add in home exercise to make exercise part of routine and to increase amount of physical activity.;Long Term: Exercising regularly at least 3-5 days a week. Short Term: Attend rehab on a regular basis to increase amount of physical activity.;Long Term: Add in home exercise to make exercise part of routine and to increase amount of physical activity.;Long Term: Exercising regularly at least 3-5 days a week.     Increase Strength and Stamina Yes Yes Yes     Intervention Provide advice, education, support and counseling about physical activity/exercise needs.;Develop an individualized exercise prescription for aerobic and resistive training based on initial evaluation findings, risk stratification, comorbidities and participant's personal goals. Provide advice, education, support and counseling about physical activity/exercise needs.;Develop an  individualized exercise prescription for aerobic and resistive training based on initial evaluation findings, risk stratification, comorbidities and participant's personal goals. Provide advice, education, support and counseling about physical activity/exercise needs.;Develop an individualized exercise prescription for aerobic and resistive training based on initial evaluation findings, risk stratification, comorbidities and participant's personal goals.     Expected Outcomes Short Term: Increase workloads from initial exercise prescription for resistance, speed, and METs.;Short Term: Perform resistance training exercises routinely during rehab and add in resistance training at home;Long Term: Improve cardiorespiratory fitness, muscular endurance and strength as measured by increased METs and functional capacity (6MWT) Short Term: Increase workloads from initial exercise prescription for resistance, speed, and METs.;Short Term: Perform resistance training exercises routinely during rehab and add in resistance training at home;Long Term: Improve cardiorespiratory fitness, muscular endurance and strength as measured by increased METs and functional capacity (6MWT) Short Term: Increase workloads from initial exercise prescription for resistance, speed, and METs.;Short Term: Perform resistance training exercises routinely during rehab and add in resistance training at home;Long Term: Improve cardiorespiratory fitness, muscular endurance and strength as measured by increased METs and functional capacity (6MWT)     Able to understand and use rate of perceived exertion (RPE) scale Yes Yes Yes     Intervention Provide education and explanation on how to use RPE scale Provide education and explanation on how to use RPE scale Provide education and explanation on how to use RPE scale     Expected Outcomes Short Term: Able to use RPE daily in rehab to express subjective intensity level;Long Term:  Able to use RPE to guide  intensity level when exercising independently Short Term: Able to use RPE daily in rehab to express subjective intensity level;Long Term:  Able to use RPE to guide intensity level when exercising independently Short Term: Able to use RPE daily in rehab to express subjective intensity level;Long Term:  Able to use RPE to guide intensity level when exercising independently     Able to understand and use Dyspnea scale Yes Yes Yes     Intervention Provide education and explanation on how to use Dyspnea scale Provide education and explanation on how to use Dyspnea scale Provide education and explanation on how to use Dyspnea scale     Expected Outcomes Short Term: Able to use Dyspnea scale daily in rehab to express subjective sense of shortness of breath during exertion;Long Term: Able to use Dyspnea scale to guide intensity level when exercising independently Short Term: Able to use Dyspnea scale daily in rehab to express subjective sense of shortness of breath during exertion;Long Term: Able to use Dyspnea scale to guide intensity  level when exercising independently Short Term: Able to use Dyspnea scale daily in rehab to express subjective sense of shortness of breath during exertion;Long Term: Able to use Dyspnea scale to guide intensity level when exercising independently     Knowledge and understanding of Target Heart Rate Range (THRR) Yes Yes Yes     Intervention Provide education and explanation of THRR including how the numbers were predicted and where they are located for reference Provide education and explanation of THRR including how the numbers were predicted and where they are located for reference Provide education and explanation of THRR including how the numbers were predicted and where they are located for reference     Expected Outcomes Short Term: Able to state/look up THRR;Short Term: Able to use daily as guideline for intensity in rehab;Long Term: Able to use THRR to govern intensity when  exercising independently Short Term: Able to state/look up THRR;Short Term: Able to use daily as guideline for intensity in rehab;Long Term: Able to use THRR to govern intensity when exercising independently Short Term: Able to state/look up THRR;Short Term: Able to use daily as guideline for intensity in rehab;Long Term: Able to use THRR to govern intensity when exercising independently     Understanding of Exercise Prescription Yes Yes Yes     Intervention Provide education, explanation, and written materials on patient's individual exercise prescription Provide education, explanation, and written materials on patient's individual exercise prescription Provide education, explanation, and written materials on patient's individual exercise prescription     Expected Outcomes Long Term: Able to explain home exercise prescription to exercise independently;Short Term: Able to explain program exercise prescription Long Term: Able to explain home exercise prescription to exercise independently;Short Term: Able to explain program exercise prescription Long Term: Able to explain home exercise prescription to exercise independently;Short Term: Able to explain program exercise prescription              Exercise Goals Re-Evaluation:  Exercise Goals Re-Evaluation     [place] Name 09/17/21 0939 09/18/21 1504 10/15/21 0835         Exercise Goal Re-Evaluation   Exercise Goals Review Increase Physical Activity;Increase Strength and Stamina;Able to understand and use rate of perceived exertion (RPE) scale;Able to understand and use Dyspnea scale;Knowledge and understanding of Target Heart Rate Range (THRR);Understanding of Exercise Prescription Increase Physical Activity;Increase Strength and Stamina;Able to understand and use rate of perceived exertion (RPE) scale;Able to understand and use Dyspnea scale;Knowledge and understanding of Target Heart Rate Range (THRR);Understanding of Exercise Prescription Increase  Physical Activity;Increase Strength and Stamina;Able to understand and use rate of perceived exertion (RPE) scale;Able to understand and use Dyspnea scale;Knowledge and understanding of Target Heart Rate Range (THRR);Understanding of Exercise Prescription     Comments Trini is scheduled to begin exercising this week. Will monitor and progress as able. Ferdinand has completed 1 exercise session. He exercised for 15 min on the NuStep and recumbent bike. He increased his level on the recumbent bike from initial EXRX. He tolerated both exercise modes well and performed the warm up and cool down standing. It is too soon to note any discernable progressions. Will continue to monitor and progress as able. Nester has completed 7 exercise sessions. He continues to progress and tolerate progressions well. He exercises 15 min on the Nustep and recumbent bike. He averages 2.4 METs at level 3 on the Nustep and 3.7 METs at level 3 on the recumbent bike. He performs the warmup and cooldown standing. I have increased the  resistance for his resistance band. Skylur is very motivated to lose weight, so he exercises on a regular basis. We have discussed his home exercise briefly. He attends the [facility] at least 3x/wk. He will bike, walk, or swim. Will continue to monitor and progress as able.     Expected Outcomes Through exercise at rehab and home the patient will decrease shortness of breath with daily activities and feel confident in carrying out an exercise regimen at home Through exercise at rehab and home the patient will decrease shortness of breath with daily activities and feel confident in carrying out an exercise regimen at home Through exercise at rehab and home the patient will decrease shortness of breath with daily activities and feel confident in carrying out an exercise regimen at home              Nutrition & Weight - Outcomes:  Pre Biometrics - 09/10/21 1005       Pre Biometrics   Height $Remov'5\' 8"'lPhxOo$  (1.727 m)     Weight 111.4 kg    BMI (Calculated) 37.35    Grip Strength 28 kg             Post Biometrics - 11/20/21 1215        Post  Biometrics   Grip Strength 29 kg             Nutrition:   Nutrition Discharge:   Education Questionnaire Score:  Knowledge Questionnaire Score - 11/06/21 1552       Knowledge Questionnaire Score   Post Score 16/18             Goals reviewed with patient; copy given to patient.

## 2021-12-20 ENCOUNTER — Encounter: Payer: Self-pay | Admitting: Gastroenterology

## 2021-12-20 ENCOUNTER — Other Ambulatory Visit: Payer: Self-pay

## 2021-12-20 MED ORDER — METAMUCIL SMOOTH TEXTURE 58.6 % PO POWD: 1.0000 | Freq: Every day | ORAL | 1 refills | Status: DC

## 2022-02-11 ENCOUNTER — Encounter: Payer: Self-pay | Admitting: Gastroenterology

## 2022-02-11 ENCOUNTER — Ambulatory Visit (INDEPENDENT_AMBULATORY_CARE_PROVIDER_SITE_OTHER): Payer: No Typology Code available for payment source | Admitting: Gastroenterology

## 2022-02-11 ENCOUNTER — Other Ambulatory Visit: Payer: Self-pay

## 2022-02-11 VITALS — BP 126/69 | HR 68 | Temp 98.6°F | Wt 237.0 lb

## 2022-02-11 DIAGNOSIS — R1319 Other dysphagia: Secondary | ICD-10-CM

## 2022-02-11 DIAGNOSIS — R1013 Epigastric pain: Secondary | ICD-10-CM | POA: Diagnosis not present

## 2022-02-11 MED ORDER — FAMOTIDINE 20 MG PO TABS: 20.0000 mg | ORAL_TABLET | Freq: Every day | ORAL | 2 refills | Status: DC

## 2022-02-11 NOTE — Progress Notes (Signed)
? ? ?[facility] Physician: [facility] ? ?Primary Gastroenterologist:  Dr. Lucilla Lame ? ?Chief Complaint  ?Patient presents with  ? Follow-up  ? Transitions Of Care  ?  From Dr Bonna Gains  ? Medication Refill  ?  Famotidine  ? ? ?HPI: Jose Patrick is a 71 y.o. male here after being seen by one of my partners in the past.  The patient had an EGD and colonoscopy with a colonoscopy showing tubular adenomas and was recommended to have a repeat colonoscopy in 7 years.  The patient had biopsies of the stomach during the EGD that was done for dysphagia.  The esophagus was normal at that time and biopsies of the esophagus showed signs of reflux. ?The patient reports that he has not noticed any difference in his chest discomfort or feeling like food is getting stuck in his distal esophagus when he takes a medication but he has noticed significant decrease in burping when he takes the famotidine once before bedtime.  There is no report of any unexplained weight loss fevers chills nausea vomiting black stools or bloody stools ? ?Past Medical History:  ?Diagnosis Date  ? ALLERGIC RHINITIS 10/08/2007  ? Qualifier: Diagnosis of  By: Jenny Reichmann MD, Hunt Oris   ? BENIGN PROSTATIC HYPERTROPHY 09/28/2010  ? Qualifier: Diagnosis of  By: Jenny Reichmann MD, Hunt Oris   ? Bipolar affective disorder ([place]) 09/30/2012  ? COLONIC POLYPS, HX OF 10/08/2007  ? Qualifier: Diagnosis of  By: Jenny Reichmann MD, Hunt Oris   ? Complication of anesthesia   ? sleeps awhile afterwards per patient  ? CONSTIPATION, CHRONIC, HX OF 08/10/2007  ? Qualifier: Diagnosis of  By: Elveria Royals   ? DIABETES MELLITUS, TYPE II 10/08/2007  ? Qualifier: Diagnosis of  By: Jenny Reichmann MD, Hunt Oris   ? ERECTILE DYSFUNCTION 10/08/2007  ? Qualifier: Diagnosis of  By: Jenny Reichmann MD, Hunt Oris   ? HYPERLIPIDEMIA 10/08/2007  ? Qualifier: Diagnosis of  By: Jenny Reichmann MD, Hunt Oris   ? HYPERTENSION 03/20/2009  ? Qualifier: Diagnosis of  By: Jenny Reichmann MD, Hunt Oris   ? PTSD (post-traumatic stress disorder)  09/30/2012  ? SPINAL STENOSIS, LUMBAR 10/08/2007  ? Per Dr Arnoldo Morale - [facility]   Qualifier: Diagnosis of  By: Jenny Reichmann MD, Hunt Oris    ? ? ?Current Outpatient Medications  ?Medication Sig Dispense Refill  ? aspirin 81 MG chewable tablet Chew 1 tablet (81 mg total) by mouth daily. 90 tablet 3  ? Cholecalciferol (VITAMIN D) 2000 UNITS CAPS Take 1 capsule by mouth 2 (two) times daily.     ? clonazePAM (KLONOPIN) 0.5 MG tablet Take 0.5 mg by mouth 2 (two) times daily.    ? famotidine (PEPCID) 20 MG tablet Take 1 tablet (20 mg total) by mouth at bedtime. 30 tablet 0  ? gabapentin (NEURONTIN) 800 MG tablet Take 800 mg by mouth 2 (two) times daily.    ? insulin glargine (LANTUS) 100 UNIT/ML injection 20 Units.    ? JARDIANCE 10 MG TABS tablet Take 10 mg by mouth daily.    ? metFORMIN (GLUCOPHAGE) 1000 MG tablet Take 1,000 mg by mouth 2 (two) times daily.    ? psyllium (METAMUCIL SMOOTH TEXTURE) 58.6 % powder Take 1 packet by mouth daily. 90 packet 1  ? Pyridoxine HCl (VITAMIN B6 PO) Take 1 tablet by mouth daily.     ? QUEtiapine (SEROQUEL) 300 MG tablet Take 300 mg by mouth at bedtime.    ? Semaglutide,0.25 or 0.5MG /DOS, (  OZEMPIC, 0.25 OR 0.5 MG/DOSE,) 2 MG/1.5ML SOPN Inject 0.5 mg into the skin once a week.    ? sitaGLIPtin (JANUVIA) 100 MG tablet Take 100 mg by mouth daily.    ? ?No current facility-administered medications for this visit.  ? ? ?Allergies as of 02/11/2022 - Review Complete 02/11/2022  ?Allergen Reaction Noted  ? Chlorzoxazone Swelling 08/15/2011  ? Tramadol Other (See Comments) 03/10/2017  ? ? ?ROS: ? ?General: Negative for anorexia, weight loss, fever, chills, fatigue, weakness. ?ENT: Negative for hoarseness, difficulty swallowing , nasal congestion. ?CV: Negative for chest pain, angina, palpitations, dyspnea on exertion, peripheral edema.  ?Respiratory: Negative for dyspnea at rest, dyspnea on exertion, cough, sputum, wheezing.  ?GI: See history of present illness. ?GU:  Negative for dysuria,  hematuria, urinary incontinence, urinary frequency, nocturnal urination.  ?Endo: Negative for unusual weight change.  ?  ?Physical Examination: ? ? BP 126/69   Pulse 68   Temp 98.6 ?F (37 ?C) (Oral)   Wt 237 lb (107.5 kg)   BMI 36.04 kg/m?  ? ?General: Well-nourished, well-developed in no acute distress.  ?Eyes: No icterus. Conjunctivae pink. ?Lungs: Clear to auscultation bilaterally. Non-labored. ?Heart: Regular rate and rhythm, no murmurs rubs or gallops.  ?Abdomen: Bowel sounds are normal, nontender, nondistended, no hepatosplenomegaly or masses, no abdominal bruits or hernia , no rebound or guarding.   ?Extremities: No lower extremity edema. No clubbing or deformities. ?Neuro: Alert and oriented x 3.  Grossly intact. ?Skin: Warm and dry, no jaundice.   ?Psych: Alert and cooperative, normal mood and affect. ? ?Labs:  ?  ?Imaging Studies: ?No results found. ? ?Assessment and Plan:  ? ?Jose Patrick is a 71 y.o. y/o male who comes in today with a biopsy of his esophagus showing reflux and dyspepsia with burping when he does not take his H2 blocker.  The patient will be given a refill of his famotidine to be taken nightly.  The patient has been explained the plan and agrees with it. ? ? ? ? ?Lucilla Lame, MD. Marval Regal ? ? ? Note: This dictation was prepared with Dragon dictation along with smaller phrase technology. Any transcriptional errors that result from this process are unintentional.  ?

## 2022-03-19 ENCOUNTER — Ambulatory Visit (INDEPENDENT_AMBULATORY_CARE_PROVIDER_SITE_OTHER): Payer: No Typology Code available for payment source | Admitting: Pulmonary Disease

## 2022-03-19 ENCOUNTER — Encounter: Payer: Self-pay | Admitting: Pulmonary Disease

## 2022-03-19 VITALS — BP 132/68 | HR 69 | Ht 68.0 in | Wt 238.6 lb

## 2022-03-19 DIAGNOSIS — J449 Chronic obstructive pulmonary disease, unspecified: Secondary | ICD-10-CM

## 2022-03-19 MED ORDER — SPIRIVA RESPIMAT 2.5 MCG/ACT IN AERS: 2.0000 | INHALATION_SPRAY | Freq: Every day | RESPIRATORY_TRACT | 0 refills | Status: DC

## 2022-03-19 MED ORDER — FLUTICASONE-SALMETEROL 250-50 MCG/ACT IN AEPB: INHALATION_SPRAY | RESPIRATORY_TRACT | 1 refills | Status: DC

## 2022-03-19 MED ORDER — ALBUTEROL SULFATE HFA 108 (90 BASE) MCG/ACT IN AERS
2.0000 | INHALATION_SPRAY | Freq: Four times a day (QID) | RESPIRATORY_TRACT | 5 refills | Status: DC | PRN
Start: 2022-03-19 — End: 2024-05-14

## 2022-03-19 MED ORDER — SPIRIVA RESPIMAT 2.5 MCG/ACT IN AERS: 2.0000 | INHALATION_SPRAY | Freq: Every morning | RESPIRATORY_TRACT | 1 refills | Status: DC

## 2022-03-19 MED ORDER — GUAIFENESIN ER 600 MG PO TB12: 600.0000 mg | ORAL_TABLET | Freq: Two times a day (BID) | ORAL | 1 refills | Status: DC | PRN

## 2022-03-19 NOTE — Progress Notes (Signed)
? ? ?Subjective:  ? ?PATIENT ID: Jose Patrick GENDER: male DOB: 11/05/1951, MRN: [id number] ? ? ?HPI ? ?Chief Complaint  ?Patient presents with  ? Consult  ?  COPD  ? ? ?Reason for Visit: New consult for COPD ? ?[name] is a 71 year old male with hx lung cancer s/p left upper lobe lobectomy in 2013, COPD with emphysema, OSA nonadherent to CPAP, DM, HTN, reflux who presents as new consult for COPD ? ?[facility] reviewed. He was recently completed Pulmonary Rehab in January. He is prescribed Spiriva and Wixela 250 ? ?He was diagnosed with COPD for > 30 years ago. He is been taking Wixela two puffs in the morning and has not been taking Spiriva. He continues to have shortness of breath, non-productive coughing, chest congestion and wheezing. Symptoms occur at night. Friends will often ask if he is ok. He reports issues with acid reflux that is worsened on CPAP. He is scheduled to be seen in CPAP clinic tomorrow. Despite his symptoms he is active at baseline, swimming most days. Also enjoys the sauna to help his mind and breathing. ? 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 Quit 10 years. 62 pack-years. ? ?I have personally reviewed patient's past medical/family/social history, allergies, current medications. ? ?Past Medical History:  ?Diagnosis Date  ? ALLERGIC RHINITIS 10/08/2007  ? Qualifier: Diagnosis of  By: Jenny Reichmann MD, Hunt Oris   ? BENIGN PROSTATIC HYPERTROPHY 09/28/2010  ? Qualifier: Diagnosis of  By: Jenny Reichmann MD, Hunt Oris   ? Bipolar affective disorder ([place]) 09/30/2012  ? COLONIC POLYPS, HX OF 10/08/2007  ? Qualifier: Diagnosis of  By: Jenny Reichmann MD, Hunt Oris   ? Complication of anesthesia   ? sleeps awhile afterwards per patient  ? CONSTIPATION, CHRONIC, HX OF 08/10/2007  ? Qualifier: Diagnosis of  By: Elveria Royals   ? DIABETES MELLITUS, TYPE II 10/08/2007  ? Qualifier: Diagnosis of  By: Jenny Reichmann MD, Hunt Oris   ? ERECTILE DYSFUNCTION 10/08/2007  ?  Qualifier: Diagnosis of  By: Jenny Reichmann MD, Hunt Oris   ? HYPERLIPIDEMIA 10/08/2007  ? Qualifier: Diagnosis of  By: Jenny Reichmann MD, Hunt Oris   ? HYPERTENSION 03/20/2009  ? Qualifier: Diagnosis of  By: Jenny Reichmann MD, Hunt Oris   ? PTSD (post-traumatic stress disorder) 09/30/2012  ? SPINAL STENOSIS, LUMBAR 10/08/2007  ? Per Dr Arnoldo Morale - [facility]   Qualifier: Diagnosis of  By: Jenny Reichmann MD, Hunt Oris    ?  ? ?Family History  ?Problem Relation Age of Onset  ? Cancer Mother   ? Ulcers Mother   ?     Stomach  ? Depression Mother   ? Anxiety disorder Mother   ? Allergies Sister   ?  ? ?Social History  ? ?Occupational History  ? 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 Smoking status: Former  ?  Packs/day: 1.50  ?  Years: 41.00  ?  Pack years: 61.50  ?  Types: Cigarettes  ?  Quit date: 2013  ?  Years since quitting: 10.3  ? Smokeless tobacco: Never  ?Substance and Sexual Activity  ? Alcohol use: Yes  ? Drug use: No  ? Sexual activity: Not on file  ? ? ?Allergies  ?Allergen Reactions  ? Chlorzoxazone Swelling  ? Tramadol Other (See Comments)  ?  Muscles Tightness  ?  Muscles Tightness  ?  ?  ? ?Outpatient Medications Prior to Visit  ?Medication Sig Dispense Refill  ? amLODipine (NORVASC) 10 MG tablet amlodipine 10 mg tablet    ? aspirin 81 MG chewable tablet Chew 1 tablet (81 mg total) by mouth daily. 90 tablet 3  ? Cholecalciferol (VITAMIN D) 2000 UNITS CAPS Take 1 capsule by mouth 2 (two) times daily.     ? clonazePAM (KLONOPIN) 0.5 MG tablet Take 0.5 mg by mouth 2 (two) times daily.    ? gabapentin (NEURONTIN) 800 MG tablet Take 800 mg by mouth 2 (two) times daily.    ? hydrochlorothiazide (HYDRODIURIL) 50 MG tablet hydrochlorothiazide 50 mg tablet    ? JARDIANCE 10 MG TABS tablet Take 10 mg by mouth daily.    ? metFORMIN (GLUCOPHAGE) 1000 MG tablet Take 1,000 mg by mouth 2 (two) times daily.    ? psyllium (METAMUCIL SMOOTH TEXTURE) 58.6 % powder Take 1  packet by mouth daily. 90 packet 1  ? Pyridoxine HCl (VITAMIN B6 PO) Take 1 tablet by mouth daily.     ? Semaglutide,0.25 or 0.5MG /DOS, (OZEMPIC, 0.25 OR 0.5 MG/DOSE,) 2 MG/1.5ML SOPN Inject 0.5 mg into the skin once a week.    ? Testosterone 20.25 MG/ACT (1.62%) GEL testosterone 20.25 mg/1.25 gram per pump act.(1.62 %) transdermal gel    ? albuterol (VENTOLIN HFA) 108 (90 Base) MCG/ACT inhaler ProAir HFA 90 mcg/actuation aerosol inhaler    ? budesonide-formoterol (SYMBICORT) 160-4.5 MCG/ACT inhaler Inhale into the lungs.    ? Tiotropium Bromide Monohydrate (SPIRIVA RESPIMAT) 2.5 MCG/ACT AERS Spiriva Respimat 2.5 mcg/actuation solution for inhalation    ? famotidine (PEPCID) 20 MG tablet Take 1 tablet (20 mg total) by mouth at bedtime. 90 tablet 2  ? insulin glargine (LANTUS) 100 UNIT/ML injection 20 Units. (Patient not taking: Reported on 03/19/2022)    ? QUEtiapine (SEROQUEL) 300 MG tablet Take 300 mg by mouth at bedtime. (Patient not taking: Reported on 03/19/2022)    ? sitaGLIPtin (JANUVIA) 100 MG tablet Take 100 mg by mouth daily. (Patient not taking: Reported on 03/19/2022)    ? ?No facility-administered medications prior to visit.  ? ? ?Review of Systems  ?Constitutional:  Negative for chills, diaphoresis, fever, malaise/fatigue and weight loss.  ?HENT:  Positive for congestion.   ?Respiratory:  Positive for cough, shortness of breath and wheezing. Negative for hemoptysis and sputum production.   ?Cardiovascular:  Negative for chest pain, palpitations and leg swelling.  ? ? ?Objective:  ? ?Vitals:  ? 03/19/22 1023  ?BP: 132/68  ?Pulse: 69  ?SpO2: 97%  ?Weight: 238 lb 9.6 oz (108.2 kg)  ?Height: 5\' 8"  (1.727 m)  ? ?SpO2: 97 % ?O2 Device: None (Room air) ?Physical Exam: ?General: Well-appearing, no acute distress ?HENT: [place], AT ?Eyes: EOMI, no scleral icterus ?Respiratory: Clear to auscultation bilaterally.  No crackles, wheezing or rales ?Cardiovascular: RRR, -M/R/G, no JVD ?Extremities:-Edema,-tenderness ?Neuro: AAO  x4, CNII-XII grossly intact ?Psych: Normal mood, normal affect ? ?Data Reviewed: ? ?Imaging: ?PET/CT 01/01/12 2.2 cm LUL hypermetabolic nodule in LUL. Paraseptal and centrilobular emphysema. ? ?PFT: ?None on file ? ?Labs: ?CBC ?   ?Component Value Date/Time  ? WBC 6.6 05/26/2021 0416  ? RBC 5.27 05/26/2021 0416  ? HGB 13.7 05/26/2021 0416  ? HCT 44.0 05/26/2021 0416  ? PLT 218 05/26/2021 0416  ? MCV 83.5 05/26/2021 0416  ? MCH 26.0 05/26/2021 0416  ? MCHC 31.1 05/26/2021 0416  ? RDW 14.3 05/26/2021 0416  ? LYMPHSABS  1.4 05/26/2021 0416  ? MONOABS 0.8 05/26/2021 0416  ? EOSABS 0.0 05/26/2021 0416  ? BASOSABS 0.0 05/26/2021 0416  ? ?Abs eos 05/26/21 - 0 ? ?   ?Assessment & Plan:  ? ?Discussion: ?71 year old male with hx lung cancer s/p left upper lobe lobectomy in 2013, COPD with emphysema, OSA nonadherent to CPAP, DM, HTN, reflux who presents for new consult for COPD management. Symptomatic however nonadherent to expected inhaler regimen. Discussed clinical course and management of COPD including bronchodilator regimen and action plan for exacerbation. He completed Pulmonary Rehab in 11/2021 and active at baseline. ? ?COPD with emphysema ?--START Spiriva TWO puffs in the morning ?--CONTINUE Wixela ONE puff in the morning and ONE puff in the evening ?--CONTINUE Albuterol AS NEEDED for shortness of breath or wheezing ?--OK to mucinex twice a day as needed ?--ORDER pulmonary function tests ? ?OSA ?Managed at [facility]. Scheduled for CPAP clinic tomorrow ? ?Health Maintenance ?Immunization History  ?Administered Date(s) Administered  ? Fluad Quad(high Dose 65+) 07/31/2020, 09/21/2021  ? Influenza Split 09/19/2014  ? Influenza Whole 10/12/2007, 09/28/2010  ? Influenza, High Dose Seasonal PF 09/03/2017, 08/13/2019  ? Influenza, Seasonal, Injecte, Preservative Fre 08/12/2016  ? Influenza-Unspecified 08/06/2011, 10/18/2012, 08/26/2013, 09/13/2015  ? Moderna Covid-19 Vaccine Bivalent Booster 50yrs & up 09/21/2021  ? Moderna Sars-Covid-2  Vaccination 12/22/2019, 01/19/2020, 11/02/2020  ? Pneumococcal Conjugate-13 11/21/2016  ? Pneumococcal Polysaccharide-23 12/06/2008, 01/19/2018  ? Pneumococcal-Unspecified 11/29/2011  ? Td 12/06/2008

## 2022-03-19 NOTE — Patient Instructions (Signed)
COPD with emphysema ?--START Spiriva TWO puffs in the morning ?--CONTINUE Wixela ONE puff in the morning and ONE puff in the evening ?--CONTINUE Albuterol AS NEEDED for shortness of breath or wheezing ?--OK to mucinex twice a day as needed ? ?Follow-up with me in 3 months ?

## 2022-03-19 NOTE — Addendum Note (Signed)
Addended by: Darliss Ridgel on: 03/19/2022 12:02 PM ? ? Modules accepted: Orders ? ?

## 2022-03-20 ENCOUNTER — Telehealth: Payer: Self-pay | Admitting: Pulmonary Disease

## 2022-03-20 MED ORDER — GUAIFENESIN ER 600 MG PO TB12: 600.0000 mg | ORAL_TABLET | Freq: Two times a day (BID) | ORAL | 6 refills | Status: DC | PRN

## 2022-03-20 NOTE — Telephone Encounter (Signed)
Called and spoke with patient who is requesting to have RX for Mucinex sent over to [facility]. Rx has been sent. Nothing further needed at this time.  ?

## 2022-04-11 ENCOUNTER — Other Ambulatory Visit: Payer: Self-pay

## 2022-04-11 DIAGNOSIS — I739 Peripheral vascular disease, unspecified: Secondary | ICD-10-CM

## 2022-04-11 NOTE — Progress Notes (Signed)
Office Note     CC: Lateral lower extremity leg paresthesias with intermittent cramping Requesting Provider:  [facility], [facility]  HPI: Jose Patrick is a 71 y.o. ([birth year]) male presenting at the request of .Administration, [facility] with bilateral lower extremity leg cramping.  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  He was unfortunately injured in basic training after falling several flights of stairs which is left him with chronic pain and back problems.  Jose Patrick presents today to discuss bilateral lower extremity paresthesias with intermittent cramping.  He appreciates bilateral lower extremity numbness beginning at the knees and involving the lower leg and feet.  This occurs primarily when he is sitting, but can also occur when standing.  When this happens, he is unable to bear weight, which is led him to relying on a cane or a walker.  Ambulation is improved and symptoms improved when leaning over a shopping cart when shopping.  If he walks around, he cannot make it very far otherwise.  He also notes cramping at night, evolving bilateral feet and calves.  Past Medical History:  Diagnosis Date   ALLERGIC RHINITIS 10/08/2007   Qualifier: Diagnosis of  By: Jenny Reichmann MD, [place] PROSTATIC HYPERTROPHY 09/28/2010   Qualifier: Diagnosis of  By: Jenny Reichmann MD, Hunt Oris    Bipolar affective disorder ([place]) 09/30/2012   COLONIC POLYPS, HX OF 10/08/2007   Qualifier: Diagnosis of  By: Jenny Reichmann MD, Hunt Oris    Complication of anesthesia    sleeps awhile afterwards per patient   CONSTIPATION, CHRONIC, HX OF 08/10/2007   Qualifier: Diagnosis of  By: [place], [place], TYPE II 10/08/2007   Qualifier: Diagnosis of  By: Jenny Reichmann MD, Hunt Oris    ERECTILE DYSFUNCTION 10/08/2007   Qualifier: Diagnosis of  By: Jenny Reichmann MD, Hunt Oris    HYPERLIPIDEMIA 10/08/2007   Qualifier: Diagnosis of  By: Jenny Reichmann MD, Hunt Oris    HYPERTENSION 03/20/2009   Qualifier: Diagnosis of  By:  Jenny Reichmann MD, Hunt Oris    PTSD (post-traumatic stress disorder) 09/30/2012   SPINAL STENOSIS, LUMBAR 10/08/2007   Per Dr Arnoldo Morale - [facility]   Qualifier: Diagnosis of  By: Jenny Reichmann MD, Hunt Oris      Past Surgical History:  Procedure Laterality Date   COLONOSCOPY WITH PROPOFOL N/A 04/09/2021   Procedure: COLONOSCOPY WITH PROPOFOL;  Surgeon: Virgel Manifold, MD;  Location: [facility] ENDOSCOPY;  Service: Endoscopy;  Laterality: N/A;   cyst off right buttock     ESOPHAGOGASTRODUODENOSCOPY (EGD) WITH PROPOFOL N/A 04/09/2021   Procedure: ESOPHAGOGASTRODUODENOSCOPY (EGD) WITH PROPOFOL;  Surgeon: Virgel Manifold, MD;  Location: [facility] ENDOSCOPY;  Service: Endoscopy;  Laterality: N/A;   PREPATELLAR BURSA EXCISION     removal right   s/p bilat hydrocelectomy  2008   x 2   TONSILLECTOMY      Social History   Socioeconomic History   Marital status: Married    Spouse name: Not on file   Number of children: 4   Years of education: Not on file   Highest education level: Not on file  Occupational History   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, 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  Tobacco Use   Smoking status: Former    Packs/day: 1.50    Years: 41.00    Pack years: 61.50    Types: Cigarettes    Quit date: 2013  Years since quitting: 10.4   Smokeless tobacco: Never  Substance and Sexual Activity   Alcohol use: Yes   Drug use: No   Sexual activity: Not on file  Other Topics Concern   Not on file  Social History Narrative   Not on file   Social Determinants of Health   Financial Resource Strain: Not on file  Food Insecurity: Not on file  Transportation Needs: Not on file  Physical Activity: Not on file  Stress: Not on file  Social Connections: Not on file  Intimate Partner Violence: Not on file   Family History  Problem Relation Age of Onset   Cancer Mother    Ulcers Mother        Stomach   Depression Mother    Anxiety disorder  Mother    Allergies Sister     Current Outpatient Medications  Medication Sig Dispense Refill   albuterol (VENTOLIN HFA) 108 (90 Base) MCG/ACT inhaler Inhale 2 puffs into the lungs every 6 (six) hours as needed for wheezing or shortness of breath. 18 g 5   amLODipine (NORVASC) 10 MG tablet amlodipine 10 mg tablet     aspirin 81 MG chewable tablet Chew 1 tablet (81 mg total) by mouth daily. 90 tablet 3   Cholecalciferol (VITAMIN D) 2000 UNITS CAPS Take 1 capsule by mouth 2 (two) times daily.      clonazePAM (KLONOPIN) 0.5 MG tablet Take 0.5 mg by mouth 2 (two) times daily.     famotidine (PEPCID) 20 MG tablet Take 1 tablet (20 mg total) by mouth at bedtime. 90 tablet 2   fluticasone-salmeterol (WIXELA INHUB) 250-50 MCG/ACT AEPB ONE puff in the in morning and ONE puff in the evening 180 each 1   gabapentin (NEURONTIN) 800 MG tablet Take 800 mg by mouth 2 (two) times daily.     guaiFENesin (MUCINEX) 600 MG 12 hr tablet Take 1 tablet (600 mg total) by mouth 2 (two) times daily as needed for to loosen phlegm or cough. 180 tablet 6   hydrochlorothiazide (HYDRODIURIL) 50 MG tablet hydrochlorothiazide 50 mg tablet     insulin glargine (LANTUS) 100 UNIT/ML injection 20 Units. (Patient not taking: Reported on 03/19/2022)     JARDIANCE 10 MG TABS tablet Take 10 mg by mouth daily.     metFORMIN (GLUCOPHAGE) 1000 MG tablet Take 1,000 mg by mouth 2 (two) times daily.     psyllium (METAMUCIL SMOOTH TEXTURE) 58.6 % powder Take 1 packet by mouth daily. 90 packet 1   Pyridoxine HCl (VITAMIN B6 PO) Take 1 tablet by mouth daily.      QUEtiapine (SEROQUEL) 300 MG tablet Take 300 mg by mouth at bedtime. (Patient not taking: Reported on 03/19/2022)     Semaglutide,0.25 or 0.5MG /DOS, (OZEMPIC, 0.25 OR 0.5 MG/DOSE,) 2 MG/1.5ML SOPN Inject 0.5 mg into the skin once a week.     sitaGLIPtin (JANUVIA) 100 MG tablet Take 100 mg by mouth daily. (Patient not taking: Reported on 03/19/2022)     Testosterone 20.25 MG/ACT (1.62%)  GEL testosterone 20.25 mg/1.25 gram per pump act.(1.62 %) transdermal gel     Tiotropium Bromide Monohydrate (SPIRIVA RESPIMAT) 2.5 MCG/ACT AERS Inhale 2 puffs into the lungs in the morning. 12 g 1   Tiotropium Bromide Monohydrate (SPIRIVA RESPIMAT) 2.5 MCG/ACT AERS Inhale 2 puffs into the lungs daily. 4 g 0   No current facility-administered medications for this visit.    Allergies  Allergen Reactions   Chlorzoxazone Swelling   Tramadol Other (See  Comments)    Muscles Tightness  Muscles Tightness       REVIEW OF SYSTEMS:  [X]  denotes positive finding, [ ]  denotes negative finding Cardiac  Comments:  Chest pain or chest pressure:    Shortness of breath upon exertion:    Short of breath when lying flat:    Irregular heart rhythm:        Vascular    Pain in calf, thigh, or hip brought on by ambulation:    Pain in feet at night that wakes you up from your sleep:     Blood clot in your veins:    Leg swelling:         Pulmonary    Oxygen at home:    Productive cough:     Wheezing:         Neurologic    Sudden weakness in arms or legs:     Sudden numbness in arms or legs:     Sudden onset of difficulty speaking or slurred speech:    Temporary loss of vision in one eye:     Problems with dizziness:         Gastrointestinal    Blood in stool:     Vomited blood:         Genitourinary    Burning when urinating:     Blood in urine:        Psychiatric    Major depression:         Hematologic    Bleeding problems:    Problems with blood clotting too easily:        Skin    Rashes or ulcers:        Constitutional    Fever or chills:      PHYSICAL EXAMINATION:  There were no vitals filed for this visit.  General:  WDWN in NAD; vital signs documented above Gait: Not observed HENT: WNL, normocephalic Pulmonary: normal non-labored breathing , without wheezing Cardiac: regular HR,  Abdomen: soft, NT, no masses Skin: without rashes Vascular Exam/Pulses:  Right  Left  Radial 2+ (normal) 2+ (normal)  Ulnar 2+ (normal) 2+ (normal)  Femoral    Popliteal    DP 2+ (normal) 2+ (normal)  PT     Extremities: without ischemic changes, without Gangrene , without cellulitis; without open wounds;  Musculoskeletal: no muscle wasting or atrophy  Neurologic: A&O X 3;  No focal weakness or paresthesias are detected Psychiatric:  The pt has Normal affect.   Non-Invasive Vascular Imaging:   +-------+-----------+-----------+------------+------------+  ABI/TBIToday's ABIToday's TBIPrevious ABIPrevious TBI  +-------+-----------+-----------+------------+------------+  Right  1.05       0.75                                 +-------+-----------+-----------+------------+------------+  Left   1.12       0.54                                 +-------+-----------+-----------+------------+------------+     ASSESSMENT/PLAN: Jose Patrick is a 71 y.o. male presenting with intermittent bilateral lower extremity paresthesias, cramping.  ABIs were reviewed demonstrating normal perfusion bilaterally.  Toe pressures are slightly reduced in the left, demonstrating a mild component of peripheral arterial disease.  On physical exam Jose Patrick had palpable pulses bilaterally.  In evaluating his symptoms, paresthesias he appreciates bilaterally are not from arterial  insufficiency.  With his history of injury, and improved ambulation with leaning over a shopping cart, I think spinal stenosis could be the etiology of his current symptoms.  I will refer Jose Patrick to neurosurgery for further work-up. During that he does have a degree of peripheral arterial disease, I will see him yearly with ABI.    Jose John, MD [facility] [phone]

## 2022-04-12 ENCOUNTER — Encounter: Payer: Self-pay | Admitting: *Deleted

## 2022-04-12 ENCOUNTER — Ambulatory Visit (INDEPENDENT_AMBULATORY_CARE_PROVIDER_SITE_OTHER): Payer: No Typology Code available for payment source | Admitting: Vascular Surgery

## 2022-04-12 ENCOUNTER — Encounter: Payer: Self-pay | Admitting: Vascular Surgery

## 2022-04-12 ENCOUNTER — Ambulatory Visit (HOSPITAL_COMMUNITY)
Admission: RE | Admit: 2022-04-12 | Discharge: 2022-04-12 | Disposition: A | Discharge status: Home / Self Care | Payer: No Typology Code available for payment source | Source: Ambulatory Visit | Attending: Vascular Surgery | Admitting: Vascular Surgery

## 2022-04-12 VITALS — BP 119/69 | HR 60 | Temp 98.0°F | Resp 20 | Ht 68.0 in | Wt 231.0 lb

## 2022-04-12 DIAGNOSIS — I739 Peripheral vascular disease, unspecified: Secondary | ICD-10-CM | POA: Diagnosis present

## 2022-04-29 ENCOUNTER — Ambulatory Visit: Payer: No Typology Code available for payment source

## 2022-04-29 DIAGNOSIS — J449 Chronic obstructive pulmonary disease, unspecified: Secondary | ICD-10-CM

## 2022-04-29 LAB — PULMONARY FUNCTION TEST
DL/VA % pred: 100 %
DL/VA: 4.1 ml/min/mmHg/L
DLCO cor % pred: 66 %
DLCO cor: 16.26 ml/min/mmHg
DLCO unc % pred: 66 %
DLCO unc: 16.26 ml/min/mmHg
FEF 25-75 Post: 1 L/sec
FEF 25-75 Pre: 0.88 L/sec
FEF2575-%Change-Post: 13 %
FEF2575-%Pred-Post: 43 %
FEF2575-%Pred-Pre: 38 %
FEV1-%Change-Post: 1 %
FEV1-%Pred-Post: 59 %
FEV1-%Pred-Pre: 58 %
FEV1-Post: 1.59 L
FEV1-Pre: 1.57 L
FEV1FVC-%Change-Post: -1 %
FEV1FVC-%Pred-Pre: 85 %
FEV6-%Change-Post: 4 %
FEV6-%Pred-Post: 72 %
FEV6-%Pred-Pre: 70 %
FEV6-Post: 2.46 L
FEV6-Pre: 2.36 L
FEV6FVC-%Change-Post: 0 %
FEV6FVC-%Pred-Post: 103 %
FEV6FVC-%Pred-Pre: 103 %
FVC-%Change-Post: 3 %
FVC-%Pred-Post: 70 %
FVC-%Pred-Pre: 67 %
FVC-Post: 2.49 L
FVC-Pre: 2.39 L
Post FEV1/FVC ratio: 64 %
Post FEV6/FVC ratio: 99 %
Pre FEV1/FVC ratio: 65 %
Pre FEV6/FVC Ratio: 99 %
RV % pred: 80 %
RV: 1.89 L
TLC % pred: 70 %
TLC: 4.66 L

## 2022-04-30 ENCOUNTER — Telehealth: Payer: Self-pay | Admitting: Pulmonary Disease

## 2022-05-01 NOTE — Telephone Encounter (Signed)
Called and spoke with patient. He stated that he needs a note stating that he was here in our office for a PFT on 04/29/22. He needs this for travel reimbursement for the [place]. I advised him that I would go ahead and get this ready for him. He wishes to have it mailed to him.   Nothing further needed at time of call.

## 2022-05-08 ENCOUNTER — Telehealth: Payer: Self-pay | Admitting: Vascular Surgery

## 2022-05-08 ENCOUNTER — Telehealth: Payer: Self-pay

## 2022-05-08 NOTE — Telephone Encounter (Signed)
Pt called stating that his referral was not sent and wanted to know "who dropped the ball."  Reviewed chart, referral requested by Dr Virl Cagey to neurosurgery. Msg sent to [facility] to f/u.

## 2022-05-08 NOTE — Telephone Encounter (Signed)
Spoke to [facility] and they do not deal with spinal stenosis. I am sending referral to [facility]. Call pt to let him know.

## 2022-05-09 ENCOUNTER — Telehealth: Payer: Self-pay

## 2022-05-09 ENCOUNTER — Telehealth: Payer: Self-pay | Admitting: Vascular Surgery

## 2022-05-09 NOTE — Telephone Encounter (Signed)
Pt called, left vm stating that the referral was done backwards. It needed to be sent to the [facility] for them to send it to [place]. Msg sent to [facility] for callback.

## 2022-05-09 NOTE — Telephone Encounter (Signed)
Sent referral to [facility]. Pt has [place] for insurance. Carita Pian sent referral to [facility]. If they do not have appt for him within  their system in 30 days they will outsource him to [place] [facility] and [facility]. I spoke to patient and he is aware.

## 2022-05-13 ENCOUNTER — Telehealth: Payer: Self-pay | Admitting: Pulmonary Disease

## 2022-05-13 NOTE — Telephone Encounter (Signed)
Please update patient on PFT results:  Moderately severe obstructive and restrictive defect is present that is consistent with your known emphysema and hx lung resection. Please continue your prescribed inhalers and we can further discuss your management at your next scheduled visit in August.

## 2022-05-29 ENCOUNTER — Telehealth: Payer: Self-pay | Admitting: Vascular Surgery

## 2022-05-29 NOTE — Telephone Encounter (Signed)
Dr Virl Cagey requested [facility] for Neurosurgery consult.  Pt has been seen at [facility].  Sent [facility] auth request 05/22/22.  Per [facility] response on 05/27/22 - This request was denied at this time because a CT Myelogram or MRI is required within 1 year for a community care Neurosurgery referral.  The providers here have ordered a Nephrology consult & [facility] has appt on 06/10/22. He needs to see Nephrology due to kidney disease and they will decide if he can have contrast for this imaging. At that point a decision will be made about Neurosurgery.   Thank you, Frederica Kuster, [facility]

## 2022-06-24 ENCOUNTER — Ambulatory Visit (INDEPENDENT_AMBULATORY_CARE_PROVIDER_SITE_OTHER): Payer: No Typology Code available for payment source | Admitting: Pulmonary Disease

## 2022-06-24 ENCOUNTER — Encounter: Payer: Self-pay | Admitting: Pulmonary Disease

## 2022-06-24 VITALS — BP 112/58 | HR 70 | Temp 98.3°F | Ht 68.0 in | Wt 241.6 lb

## 2022-06-24 DIAGNOSIS — J449 Chronic obstructive pulmonary disease, unspecified: Secondary | ICD-10-CM

## 2022-06-24 MED ORDER — FLUTICASONE-SALMETEROL 250-50 MCG/ACT IN AEPB: INHALATION_SPRAY | RESPIRATORY_TRACT | 1 refills | Status: DC

## 2022-06-24 MED ORDER — INCRUSE ELLIPTA 62.5 MCG/ACT IN AEPB: 1.0000 | INHALATION_SPRAY | Freq: Every day | RESPIRATORY_TRACT | 11 refills | Status: DC

## 2022-06-24 NOTE — Patient Instructions (Addendum)
COPD with emphysema Moderate restrictive and obstructive defect in setting of lobectomy and emphysema --STOP Spiriva --START Incruse ONE puff ONCE a day --CONTINUE Wixela ONE puff in the morning and ONE puff in the evening --CONTINUE Albuterol AS NEEDED for shortness of breath or wheezing --OK to mucinex twice a day as needed  OSA Managed at [facility]  Bradycardia --Follow-up with Cardiology when scheduled  Follow-up with me in 4 months

## 2022-06-24 NOTE — Progress Notes (Signed)
Subjective:   PATIENT ID: Jose Patrick GENDER: male DOB: December 03, 1950, MRN: [id number]   HPI  Chief Complaint  Patient presents with   Follow-up    Pt states the Spiriva is to strong for him and he is unable to take it. States his breathing has been bad due to the weather.    Reason for Visit: Follow-up  [name] is a 71 year old male with hx lung cancer s/p left upper lobe lobectomy in 2013, COPD with emphysema, OSA nonadherent to CPAP, DM, HTN, reflux who presents for follow-up.  Initial consult He was diagnosed with COPD for > 30 years ago. He is been taking Wixela two puffs in the morning and has not been taking Spiriva. He continues to have shortness of breath, non-productive coughing, chest congestion and wheezing. Symptoms occur at night. Friends will often ask if he is ok. He reports issues with acid reflux that is worsened on CPAP. He is scheduled to be seen in CPAP clinic tomorrow. Despite his symptoms he is active at baseline, swimming most days. Also enjoys the sauna to help his mind and breathing. He was recently completed Pulmonary Rehab in January. He is prescribed Spiriva and Wixela 250  06/24/22 Unable to tolerate Spiriva to burning in the back of his throat related to use. The heat/humidity will aggravate his breathing and wheezing and will improve with air conditioning. [facility]. Walks the track 30-45 min (sometimes uses his walker) and light upper and body weights for 1 hour. Takes 20 min breaks. Enjoys using sauna and pool. He reports recent issues with bradycardia. Awaiting cardiology referral. He is also having intermittent upper arm numbness.   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 Quit 10 years. 62 pack-years.   Past Medical History:  Diagnosis Date   ALLERGIC RHINITIS 10/08/2007   Qualifier: Diagnosis of  By: Jenny Reichmann MD, [place] PROSTATIC HYPERTROPHY 09/28/2010    Qualifier: Diagnosis of  By: Jenny Reichmann MD, [place] Oris    Bipolar affective disorder ([place]) 09/30/2012   COLONIC POLYPS, HX OF 10/08/2007   Qualifier: Diagnosis of  By: Jenny Reichmann MD, [place] Oris    Complication of anesthesia    sleeps awhile afterwards per patient   CONSTIPATION, CHRONIC, HX OF 08/10/2007   Qualifier: Diagnosis of  By: [place], [place], TYPE II 10/08/2007   Qualifier: Diagnosis of  By: Jenny Reichmann MD, [place] Oris    ERECTILE DYSFUNCTION 10/08/2007   Qualifier: Diagnosis of  By: Jenny Reichmann MD, [place] Oris    HYPERLIPIDEMIA 10/08/2007   Qualifier: Diagnosis of  By: Jenny Reichmann MD, [place] Oris    HYPERTENSION 03/20/2009   Qualifier: Diagnosis of  By: Jenny Reichmann MD, [place] Oris    PTSD (post-traumatic stress disorder) 09/30/2012   SPINAL STENOSIS, LUMBAR 10/08/2007   Per Dr Arnoldo Morale - [facility]   Qualifier: Diagnosis of  By: Jenny Reichmann MD, [place] Oris       Family History  Problem Relation Age of Onset   Cancer Mother    Ulcers Mother        Stomach   Depression Mother    Anxiety disorder Mother    Allergies Sister      Social History   Occupational History   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, 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  Tobacco Use  Smoking status: Former    Packs/day: 1.50    Years: 41.00    Total pack years: 61.50    Types: Cigarettes    Quit date: 2013    Years since quitting: 10.6   Smokeless tobacco: Never  Substance and Sexual Activity   Alcohol use: Yes   Drug use: No   Sexual activity: Not on file    Allergies  Allergen Reactions   Chlorzoxazone Swelling   Tramadol Other (See Comments)    Muscles Tightness  Muscles Tightness       Outpatient Medications Prior to Visit  Medication Sig Dispense Refill   albuterol (VENTOLIN HFA) 108 (90 Base) MCG/ACT inhaler Inhale 2 puffs into the lungs every 6 (six) hours as needed for wheezing or shortness of breath. 18 g 5   amLODipine (NORVASC) 10 MG tablet amlodipine 10 mg tablet      aspirin 81 MG chewable tablet Chew 1 tablet (81 mg total) by mouth daily. 90 tablet 3   Cholecalciferol (VITAMIN D) 2000 UNITS CAPS Take 1 capsule by mouth 2 (two) times daily.      clonazePAM (KLONOPIN) 0.5 MG tablet Take 0.5 mg by mouth 2 (two) times daily.     gabapentin (NEURONTIN) 300 MG capsule      guaiFENesin (MUCINEX) 600 MG 12 hr tablet Take 1 tablet (600 mg total) by mouth 2 (two) times daily as needed for to loosen phlegm or cough. 180 tablet 6   insulin glargine (LANTUS) 100 UNIT/ML injection 20 Units.     JARDIANCE 10 MG TABS tablet Take 10 mg by mouth daily.     metFORMIN (GLUCOPHAGE) 1000 MG tablet Take 1,000 mg by mouth 2 (two) times daily.     psyllium (METAMUCIL SMOOTH TEXTURE) 58.6 % powder Take 1 packet by mouth daily. 90 packet 1   Pyridoxine HCl (VITAMIN B6 PO) Take 1 tablet by mouth daily.      QUEtiapine (SEROQUEL) 300 MG tablet Take 300 mg by mouth at bedtime.     Semaglutide,0.25 or 0.5MG /DOS, (OZEMPIC, 0.25 OR 0.5 MG/DOSE,) 2 MG/1.5ML SOPN Inject 0.5 mg into the skin once a week.     sitaGLIPtin (JANUVIA) 100 MG tablet Take 100 mg by mouth daily.     Testosterone 20.25 MG/ACT (1.62%) GEL testosterone 20.25 mg/1.25 gram per pump act.(1.62 %) transdermal gel     fluticasone-salmeterol (WIXELA INHUB) 250-50 MCG/ACT AEPB ONE puff in the in morning and ONE puff in the evening 180 each 1   Tiotropium Bromide Monohydrate (SPIRIVA RESPIMAT) 2.5 MCG/ACT AERS Inhale 2 puffs into the lungs in the morning. 12 g 1   famotidine (PEPCID) 20 MG tablet Take 1 tablet (20 mg total) by mouth at bedtime. 90 tablet 2   gabapentin (NEURONTIN) 800 MG tablet Take 800 mg by mouth 2 (two) times daily.     hydrochlorothiazide (HYDRODIURIL) 50 MG tablet hydrochlorothiazide 50 mg tablet     No facility-administered medications prior to visit.    Review of Systems  Constitutional:  Negative for chills, diaphoresis, fever, malaise/fatigue and weight loss.  HENT:  Negative for congestion.    Respiratory:  Positive for cough and wheezing. Negative for hemoptysis, sputum production and shortness of breath.   Cardiovascular:  Negative for chest pain, palpitations and leg swelling.     Objective:   Vitals:   06/24/22 1013  BP: (!) 112/58  Pulse: 70  Temp: 98.3 F (36.8 C)  TempSrc: Oral  SpO2: 96%  Weight: 241 lb 9.6 oz (109.6  kg)  Height: 5\' 8"  (1.727 m)   SpO2: 96 % (RA) O2 Device: None (Room air)  Physical Exam: General: Well-appearing, no acute distress HENT: [place], AT Eyes: EOMI, no scleral icterus Respiratory: Clear to auscultation bilaterally.  No crackles, wheezing or rales Cardiovascular: RRR, -M/R/G, no JVD Extremities:-Edema,-tenderness Neuro: AAO x4, CNII-XII grossly intact Psych: Normal mood, normal affect  Data Reviewed:  Imaging: PET/CT 01/01/12 2.2 cm LUL hypermetabolic nodule in LUL. Paraseptal and centrilobular emphysema.  PFT: 04/29/22 FVC 2.49 (70%) FEV1 1.59 (59%) Ratio 64  TLC 70% DLCO 66% Interpretation: Moderately severe obstructive and restrictive defect with mildly reduced DLCO.  Labs: CBC    Component Value Date/Time   WBC 6.6 05/26/2021 0416   RBC 5.27 05/26/2021 0416   HGB 13.7 05/26/2021 0416   HCT 44.0 05/26/2021 0416   PLT 218 05/26/2021 0416   MCV 83.5 05/26/2021 0416   MCH 26.0 05/26/2021 0416   MCHC 31.1 05/26/2021 0416   RDW 14.3 05/26/2021 0416   LYMPHSABS 1.4 05/26/2021 0416   MONOABS 0.8 05/26/2021 0416   EOSABS 0.0 05/26/2021 0416   BASOSABS 0.0 05/26/2021 0416   Abs eos 05/26/21 - 0     Assessment & Plan:   Discussion: 71 year old male with hx lung cancer s/p LUL lobectomy in 2013, COPD with emphysema, OSA not on CPAP, DM2, HTN and refluex who presents for follow-up. Compliant with triple therapy however unable to tolerate to Spiriva due to irritation. Discussed clinical course and management of COPD/asthma including bronchodilator regimen and action plan for exacerbation. Completed Pulmonary Rehab 11/2021  and continues to be very active.  COPD with emphysema Moderate restrictive and obstructive defect in setting of lobectomy and emphysema --STOP Spiriva --START Incruse ONE puff ONCE a day --CONTINUE Wixela ONE puff in the morning and ONE puff in the evening --CONTINUE Albuterol AS NEEDED for shortness of breath or wheezing --OK to mucinex twice a day as needed  OSA Managed at [facility]  Bradycardia --Follow-up with Cardiology when scheduled  Health Maintenance Immunization History  Administered Date(s) Administered   Fluad Quad(high Dose 65+) 07/31/2020, 09/21/2021   Influenza Split 09/19/2014   Influenza Whole 10/12/2007, 09/28/2010   Influenza, High Dose Seasonal PF 09/03/2017, 08/13/2019   Influenza, Seasonal, Injecte, Preservative Fre 08/12/2016   Influenza-Unspecified 08/06/2011, 10/18/2012, 08/26/2013, 09/13/2015   Moderna Covid-19 Vaccine Bivalent Booster 76yrs & up 09/21/2021   Moderna Sars-Covid-2 Vaccination 12/22/2019, 01/19/2020, 11/02/2020   Pneumococcal Conjugate-13 11/21/2016   Pneumococcal Polysaccharide-23 12/06/2008, 01/19/2018   Pneumococcal-Unspecified 11/29/2011   Td 12/06/2008   Tdap 11/29/2011, 10/16/2021   Zoster Recombinat (Shingrix) 02/24/2019, 05/27/2019   Zoster, Live 11/03/2012   CT Lung Screen - hx lung cancer. [facility]  No orders of the defined types were placed in this encounter.  Meds ordered this encounter  Medications   umeclidinium bromide (INCRUSE ELLIPTA) 62.5 MCG/ACT AEPB    Sig: Inhale 1 puff into the lungs daily.    Dispense:  30 each    Refill:  11   fluticasone-salmeterol (WIXELA INHUB) 250-50 MCG/ACT AEPB    Sig: ONE puff in the in morning and ONE puff in the evening    Dispense:  180 each    Refill:  1    Please dispense 3 inhalers    Return in about 4 months (around 10/24/2022).  I have spent a total time of 31-minutes on the day of the appointment including chart review, data review, collecting history,  coordinating care and discussing  medical diagnosis and plan with the patient/family. Past medical history, allergies, medications were reviewed. Pertinent imaging, labs and tests included in this note have been reviewed and interpreted independently by me.  [place], MD [facility] 06/24/2022 10:36 AM  Office Number [phone]

## 2022-07-29 ENCOUNTER — Telehealth: Payer: Self-pay | Admitting: Pharmacy Technician

## 2022-07-29 MED ORDER — ATROVENT HFA 17 MCG/ACT IN AERS
INHALATION_SPRAY | RESPIRATORY_TRACT | 5 refills | Status: DC
Start: 2022-07-29 — End: 2022-08-20

## 2022-07-29 NOTE — Addendum Note (Signed)
Addended by: Rodman Pickle on: 07/29/2022 01:29 PM   Modules accepted: Orders

## 2022-07-29 NOTE — Telephone Encounter (Signed)
[facility] sent a fax that Ipratropium,  Spariva, Stiolto, Combivent, and Advair are alternatives. Would you like to change it to any of these?

## 2022-07-29 NOTE — Telephone Encounter (Signed)
Jose Patrick Telephone Encounter  Called patient to discuss options. Failed Spiriva. Incruse not covered. Will try ipratropium with the understanding that this is not the preferred agent for maintenance.  Ipratropium HFA (Atrovent) two puffs TID

## 2022-08-01 NOTE — Telephone Encounter (Signed)
If the ipratropium doesn't work for the pt and the others aren't appropriate, just let us know the reasons each one will not work for the pt and we'll send that to the [place], so they will fill the Incruse.

## 2022-08-20 ENCOUNTER — Encounter: Payer: Self-pay | Admitting: Physical Medicine and Rehabilitation

## 2022-08-20 MED ORDER — ATROVENT HFA 17 MCG/ACT IN AERS: 2.0000 | INHALATION_SPRAY | Freq: Three times a day (TID) | RESPIRATORY_TRACT | 5 refills | Status: DC

## 2022-08-20 NOTE — Addendum Note (Signed)
Addended by: Rodman Pickle on: 08/20/2022 06:38 PM   Modules accepted: Orders

## 2022-08-21 ENCOUNTER — Telehealth: Payer: Self-pay | Admitting: Pulmonary Disease

## 2022-08-21 NOTE — Telephone Encounter (Signed)
Attempted to call [facility] for directions on inhaler. Left voicemail of instructions. But advised them to call office back if there was still questions. Nothing further needed

## 2022-08-22 ENCOUNTER — Telehealth: Payer: Self-pay | Admitting: Pulmonary Disease

## 2022-08-22 NOTE — Telephone Encounter (Signed)
Spoke to pharmacy and went over instructions of Atrovent with her. She verified understanding. Nothing further needed

## 2022-09-02 ENCOUNTER — Encounter
Payer: No Typology Code available for payment source | Attending: Physical Medicine and Rehabilitation | Admitting: Physical Medicine and Rehabilitation

## 2022-09-02 ENCOUNTER — Encounter: Payer: Self-pay | Admitting: Physical Medicine and Rehabilitation

## 2022-09-02 VITALS — BP 127/72 | HR 69 | Ht 68.0 in | Wt 235.0 lb

## 2022-09-02 DIAGNOSIS — R200 Anesthesia of skin: Secondary | ICD-10-CM | POA: Diagnosis present

## 2022-09-02 DIAGNOSIS — G542 Cervical root disorders, not elsewhere classified: Secondary | ICD-10-CM | POA: Diagnosis present

## 2022-09-02 NOTE — Progress Notes (Signed)
Results of EMG/NCS to be scanned and faxed within 2-3 business days   Gertie Gowda, DO 09/02/2022

## 2022-10-04 ENCOUNTER — Telehealth (HOSPITAL_BASED_OUTPATIENT_CLINIC_OR_DEPARTMENT_OTHER): Payer: Self-pay | Admitting: Pulmonary Disease

## 2022-10-04 NOTE — Telephone Encounter (Signed)
Called and spoke with patient. He wanted to verify the instructions on his Atrovent. I went over the instructions that Dr. Loanne Drilling had on the prescription. He verbalized understanding.   Nothing further needed at time of call.

## 2022-11-04 ENCOUNTER — Emergency Department (HOSPITAL_COMMUNITY)
Admission: EM | Admit: 2022-11-04 | Discharge: 2022-11-05 | Disposition: A | Discharge status: Home / Self Care | Payer: No Typology Code available for payment source | Attending: Emergency Medicine | Admitting: Emergency Medicine

## 2022-11-04 ENCOUNTER — Emergency Department (HOSPITAL_COMMUNITY): Payer: No Typology Code available for payment source

## 2022-11-04 ENCOUNTER — Other Ambulatory Visit: Payer: Self-pay

## 2022-11-04 ENCOUNTER — Encounter (HOSPITAL_COMMUNITY): Payer: Self-pay

## 2022-11-04 DIAGNOSIS — I10 Essential (primary) hypertension: Secondary | ICD-10-CM | POA: Insufficient documentation

## 2022-11-04 DIAGNOSIS — E1165 Type 2 diabetes mellitus with hyperglycemia: Secondary | ICD-10-CM | POA: Insufficient documentation

## 2022-11-04 DIAGNOSIS — J9811 Atelectasis: Secondary | ICD-10-CM | POA: Diagnosis not present

## 2022-11-04 DIAGNOSIS — Z1152 Encounter for screening for COVID-19: Secondary | ICD-10-CM | POA: Diagnosis not present

## 2022-11-04 DIAGNOSIS — Z79899 Other long term (current) drug therapy: Secondary | ICD-10-CM | POA: Diagnosis not present

## 2022-11-04 DIAGNOSIS — R7309 Other abnormal glucose: Secondary | ICD-10-CM

## 2022-11-04 DIAGNOSIS — R5383 Other fatigue: Secondary | ICD-10-CM

## 2022-11-04 DIAGNOSIS — N289 Disorder of kidney and ureter, unspecified: Secondary | ICD-10-CM | POA: Diagnosis not present

## 2022-11-04 DIAGNOSIS — Z794 Long term (current) use of insulin: Secondary | ICD-10-CM | POA: Insufficient documentation

## 2022-11-04 DIAGNOSIS — Z7982 Long term (current) use of aspirin: Secondary | ICD-10-CM | POA: Insufficient documentation

## 2022-11-04 DIAGNOSIS — Z7984 Long term (current) use of oral hypoglycemic drugs: Secondary | ICD-10-CM | POA: Insufficient documentation

## 2022-11-04 LAB — BASIC METABOLIC PANEL
Anion gap: 5 (ref 5–15)
BUN: 23 mg/dL (ref 8–23)
CO2: 30 mmol/L (ref 22–32)
Calcium: 10.2 mg/dL (ref 8.9–10.3)
Chloride: 106 mmol/L (ref 98–111)
Creatinine, Ser: 1.97 mg/dL — ABNORMAL HIGH (ref 0.61–1.24)
GFR, Estimated: 36 mL/min — ABNORMAL LOW (ref >60)
Glucose, Bld: 127 mg/dL — ABNORMAL HIGH (ref 70–99)
Potassium: 4.9 mmol/L (ref 3.5–5.1)
Sodium: 141 mmol/L (ref 135–145)

## 2022-11-04 LAB — CBC WITH DIFFERENTIAL/PLATELET
Abs Immature Granulocytes: 0.01 10*3/uL (ref 0.00–0.07)
Basophils Absolute: 0 10*3/uL (ref 0.0–0.1)
Basophils Relative: 1 %
Eosinophils Absolute: 0.1 10*3/uL (ref 0.0–0.5)
Eosinophils Relative: 1 %
HCT: 54.5 % — ABNORMAL HIGH (ref 39.0–52.0)
Hemoglobin: 16.7 g/dL (ref 13.0–17.0)
Immature Granulocytes: 0 %
Lymphocytes Relative: 35 %
Lymphs Abs: 2.6 10*3/uL (ref 0.7–4.0)
MCH: 24.9 pg — ABNORMAL LOW (ref 26.0–34.0)
MCHC: 30.6 g/dL (ref 30.0–36.0)
MCV: 81.1 fL (ref 80.0–100.0)
Monocytes Absolute: 0.8 10*3/uL (ref 0.1–1.0)
Monocytes Relative: 10 %
Neutro Abs: 4.1 10*3/uL (ref 1.7–7.7)
Neutrophils Relative %: 53 %
Platelets: 222 10*3/uL (ref 150–400)
RBC: 6.72 MIL/uL — ABNORMAL HIGH (ref 4.22–5.81)
RDW: 19 % — ABNORMAL HIGH (ref 11.5–15.5)
WBC: 7.6 10*3/uL (ref 4.0–10.5)
nRBC: 0 % (ref 0.0–0.2)

## 2022-11-04 LAB — TROPONIN I (HIGH SENSITIVITY): Troponin I (High Sensitivity): 11 ng/L (ref <18)

## 2022-11-04 NOTE — ED Provider Notes (Signed)
[place] DEPT Provider Note   CSN: [id number] Arrival date & time: 11/04/22  1515     History  Chief Complaint  Patient presents with   lethargic   Chest Pain   diaphoresis   Shortness of Breath    Jose Patrick is a 71 y.o. male.  The history is provided by the patient.  Chest Pain Associated symptoms: shortness of breath   Shortness of Breath Associated symptoms: chest pain   He has history of hypertension, diabetes, hyperlipidemia, bipolar disorder and comes in because of feeling weak and sleepy.  He also describes some brain fog.  Symptoms started about 1 week after he had a cardiac ablation procedure on 12/4 and started getting worse yesterday and even worse today.  He states that he has difficulty remembering things that he normally remembers.  He denies fever or chills.  He did start to have some sweats today.  He also has noted some palpitations today and some vague chest discomfort.  He denies nausea or vomiting.  He states he has been urinating more frequently than normal without any dysuria.  He denies any dyspnea.   Home Medications Prior to Admission medications   Medication Sig Start Date End Date Taking? Authorizing Provider  albuterol (VENTOLIN HFA) 108 (90 Base) MCG/ACT inhaler Inhale 2 puffs into the lungs every 6 (six) hours as needed for wheezing or shortness of breath. 03/19/22   Margaretha Seeds, MD  amLODipine (NORVASC) 10 MG tablet amlodipine 10 mg tablet 11/23/21   [provider]  aspirin 81 MG chewable tablet Chew 1 tablet (81 mg total) by mouth daily. 12/19/11   Biagio Borg, MD  Cholecalciferol (VITAMIN D) 2000 UNITS CAPS Take 1 capsule by mouth 2 (two) times daily.     [provider]  clonazePAM (KLONOPIN) 0.5 MG tablet Take 0.5 mg by mouth 2 (two) times daily. 08/06/13   [provider]  famotidine (PEPCID) 20 MG tablet Take 1 tablet (20 mg total) by mouth at bedtime. 02/11/22 03/13/22   Lucilla Lame, MD  fluticasone-salmeterol [facility] INHUB) 250-50 MCG/ACT AEPB ONE puff in the in morning and ONE puff in the evening 06/24/22   Margaretha Seeds, MD  gabapentin (NEURONTIN) 300 MG capsule     [provider]  guaiFENesin (MUCINEX) 600 MG 12 hr tablet Take 1 tablet (600 mg total) by mouth 2 (two) times daily as needed for to loosen phlegm or cough. 03/20/22   Margaretha Seeds, MD  insulin glargine (LANTUS) 100 UNIT/ML injection 20 Units.    [provider]  ipratropium (ATROVENT HFA) 17 MCG/ACT inhaler Inhale 2 puffs into the lungs in the morning, at noon, and at bedtime. Inhale 2 puffs every 6 hours three times a day 08/20/22   Margaretha Seeds, MD  JARDIANCE 10 MG TABS tablet Take 10 mg by mouth daily. 02/23/21   [provider]  metFORMIN (GLUCOPHAGE) 1000 MG tablet Take 1,000 mg by mouth 2 (two) times daily. 09/14/21   [provider]  psyllium (METAMUCIL SMOOTH TEXTURE) 58.6 % powder Take 1 packet by mouth daily. 12/20/21   Lucilla Lame, MD  Pyridoxine HCl (VITAMIN B6 PO) Take 1 tablet by mouth daily.     [provider]  QUEtiapine (SEROQUEL) 300 MG tablet Take 300 mg by mouth at bedtime.    [provider]  Semaglutide,0.25 or 0.5MG /DOS, (OZEMPIC, 0.25 OR 0.5 MG/DOSE,) 2 MG/1.5ML SOPN Inject 0.5 mg into the skin once a  week.    [provider]  sitaGLIPtin (JANUVIA) 100 MG tablet Take 100 mg by mouth daily.    [provider]  Testosterone 20.25 MG/ACT (1.62%) GEL testosterone 20.25 mg/1.25 gram per pump act.(1.62 %) transdermal gel 12/24/21   [provider]      Allergies    Chlorzoxazone and Tramadol    Review of Systems   Review of Systems  Respiratory:  Positive for shortness of breath.   Cardiovascular:  Positive for chest pain.  All other systems reviewed and are negative.   Physical Exam Updated Vital Signs BP (!) 150/75 (BP Location: Right Arm)   Pulse 68   Temp 98.4 F (36.9 C) (Oral)    Resp 18   Ht 5\' 8"  (1.727 m)   Wt 106.5 kg   SpO2 92%   BMI 35.69 kg/m  Physical Exam Vitals and nursing note reviewed.   71 year old male, resting comfortably and in no acute distress. Vital signs are significant for elevated blood pressure. Oxygen saturation is 96%, which is normal. Head is normocephalic and atraumatic. PERRLA, EOMI. Oropharynx is clear. Neck is nontender and supple without adenopathy or JVD. Back is nontender and there is no CVA tenderness.  There is no presacral edema. Lungs are clear without rales, wheezes, or rhonchi. Chest is nontender. Heart has regular rate and rhythm without murmur. Abdomen is soft, flat, nontender. Extremities have trace edema, full range of motion is present. Skin is warm and dry without rash. Neurologic: Mental status is normal, cranial nerves are intact, moves all extremities equally.  ED Results / Procedures / Treatments   Labs (all labs ordered are listed, but only abnormal results are displayed) Labs Reviewed  BASIC METABOLIC PANEL - Abnormal; Notable for the following components:      Result Value   Glucose, Bld 127 (*)    Creatinine, Ser 1.97 (*)    GFR, Estimated 36 (*)    All other components within normal limits  CBC WITH DIFFERENTIAL/PLATELET - Abnormal; Notable for the following components:   RBC 6.72 (*)    HCT 54.5 (*)    MCH 24.9 (*)    RDW 19.0 (*)    All other components within normal limits  URINALYSIS, ROUTINE W REFLEX MICROSCOPIC - Abnormal; Notable for the following components:   Glucose, UA >=500 (*)    All other components within normal limits  CREATININE, SERUM - Abnormal; Notable for the following components:   Creatinine, Ser 1.35 (*)    GFR, Estimated 56 (*)    All other components within normal limits  RESP PANEL BY RT-PCR (RSV, FLU A&B, COVID)  [id number]  MAGNESIUM  TROPONIN I (HIGH SENSITIVITY)  TROPONIN I (HIGH SENSITIVITY)    EKG EKG Interpretation  Date/Time:  Monday November 04 2022  16:11:43 EST Ventricular Rate:  75 PR Interval:  170 QRS Duration: 76 QT Interval:  392 QTC Calculation: 437 R Axis:   42 Text Interpretation: Normal sinus rhythm with sinus arrhythmia Right atrial enlargement Possible Anterolateral infarct , age undetermined Abnormal ECG When compared with ECG of 17-Dec-2007 06:30, No significant change was found Confirmed by Delora Fuel ([id number]) on 11/04/2022 11:21:54 PM  Radiology CT Head Wo Contrast  Result Date: 11/05/2022 CLINICAL DATA:  Altered mental status EXAM: CT HEAD WITHOUT CONTRAST TECHNIQUE: Contiguous axial images were obtained from the base of the skull through the vertex without intravenous contrast. RADIATION DOSE REDUCTION: This exam was performed according to the departmental dose-optimization program which includes automated  exposure control, adjustment of the mA and/or kV according to patient size and/or use of iterative reconstruction technique. COMPARISON:  10/07/2007 FINDINGS: Brain: No evidence of acute infarction, hemorrhage, hydrocephalus, extra-axial collection or mass lesion/mass effect. Mild subcortical white matter and periventricular small vessel ischemic changes. Vascular: Intracranial atherosclerosis. Skull: Normal. Negative for fracture or focal lesion. Sinuses/Orbits: Partial opacification of the left maxillary sinus. Visualized paranasal sinuses and mastoid air cells are otherwise clear. Other: None. IMPRESSION: No acute intracranial abnormality. Mild small vessel ischemic changes. Electronically Signed   By: Julian Hy M.D.   On: 11/05/2022 01:02   DG Chest 2 View  Result Date: 11/04/2022 CLINICAL DATA:  Chest pain, diaphoresis, sluggish, not thinking today, shortness of breath; history lung cancer EXAM: CHEST - 2 VIEW COMPARISON:  05/26/2021; correlation PET-CT 01/01/2012, CT chest 02/08/2015 FINDINGS: Upper normal size of cardiac silhouette. Mediastinal contours and pulmonary vascularity normal. Atherosclerotic  calcification aorta. Bibasilar atelectasis versus scarring. New opacity LEFT mid lung due to costal bar at site prior fracture of the posterior LEFT sixth rib. No acute infiltrate, pleural effusion, or pneumothorax. Prior cervical spine fusion. IMPRESSION: Bibasilar atelectasis versus scarring. Aortic Atherosclerosis (ICD10-I70.0). Electronically Signed   By: Lavonia Dana M.D.   On: 11/04/2022 17:09    Procedures Procedures  Cardiac monitor shows sinus rhythm with frequent PVCs and long episodes of ventricular bigeminy, per my interpretation.  Medications Ordered in ED Medications - No data to display  ED Course/ Medical Decision Making/ A&P                           Medical Decision Making Amount and/or Complexity of Data Reviewed Labs: ordered. Radiology: ordered.   Fatigue and brain fog of uncertain cause.  Consider occult infection, medication side effect, electrolyte disturbance.  Frequent PVCs with episodes of bigeminy.  Decreased cardiac output secondary to bigeminy could also relate to some of his symptoms.  I have reviewed and interpreted his electrocardiogram, and my interpretation is sinus arrhythmia without ST or T changes and unchanged from prior.  Chest x-ray shows bibasilar atelectasis but no evidence of pneumonia.  I have independently viewed the images, and agree with radiologist interpretation.  I have reviewed and interpreted his laboratory test, and my interpretation is elevated random glucose, moderate renal insufficiency with creatinine 1.97, normal CBC.  Creatinine is significantly higher than the last creatinine on record in our system which was 1.16 on 05/26/2021.  I have looked on care everywhere, and creatinine was 1.09 on 10/18/2022.  Acute kidney injury could be causing some of his symptoms, but he states that he has been eating and drinking normally.  Troponin is normal with repeat troponin pending.  I have ordered a magnesium level and I have ordered a urinalysis to look  for evidence of occult UTI.  I have also ordered a CT of his head to look for evidence of occult stroke.  In light of acute kidney injury, I have ordered a bolus of IV fluids.  I have reviewed his home medications, and he is not on anything that is nephrotoxic.  Repeat troponin is unchanged.  Urinalysis shows no evidence of UTI.  CT of head shows no acute process.  I have independently viewed the images, and agree with the radiologist's interpretation.  Following IV fluids, creatinine has improved substantially although not back to baseline.  Respiratory pathogen panel was negative for influenza, COVID-19, RSV.  I have explained these findings to the patient.  I believe that most of his symptoms are related to relative dehydration and I have encouraged him to drink plenty of fluids.  I have referred him back to his primary care provider for further outpatient evaluation and treatment.  Return precautions discussed.  Final Clinical Impression(s) / ED Diagnoses Final diagnoses:  Lethargy  Renal insufficiency  Elevated random blood glucose level    Rx / DC Orders ED Discharge Orders     None         Delora Fuel, MD 31/54/00 [phone]

## 2022-11-04 NOTE — ED Triage Notes (Signed)
Patient reports that he has been having fatigue and states he "feels like he is sluggish and not thinking". Patient states while at his Dr.'s appointment today, he began having chest pain, SOB, and diaphoresis.

## 2022-11-04 NOTE — ED Provider Notes (Incomplete)
[facility] DEPT Provider Note   CSN: [id number] Arrival date & time: 11/04/22  1515     History {Add pertinent medical, surgical, social history, OB history to HPI:1} Chief Complaint  Patient presents with  . lethargic  . Chest Pain  . diaphoresis  . Shortness of Breath    Jose Patrick is a 71 y.o. male.  The history is provided by the patient.  Chest Pain Associated symptoms: shortness of breath   Shortness of Breath Associated symptoms: chest pain   He has history of hypertension, diabetes, hyperlipidemia, bipolar disorder   Home Medications Prior to Admission medications   Medication Sig Start Date End Date Taking? Authorizing Provider  albuterol (VENTOLIN HFA) 108 (90 Base) MCG/ACT inhaler Inhale 2 puffs into the lungs every 6 (six) hours as needed for wheezing or shortness of breath. 03/19/22   Margaretha Seeds, MD  amLODipine (NORVASC) 10 MG tablet amlodipine 10 mg tablet 11/23/21   [provider]  aspirin 81 MG chewable tablet Chew 1 tablet (81 mg total) by mouth daily. 12/19/11   Biagio Borg, MD  Cholecalciferol (VITAMIN D) 2000 UNITS CAPS Take 1 capsule by mouth 2 (two) times daily.     [provider]  clonazePAM (KLONOPIN) 0.5 MG tablet Take 0.5 mg by mouth 2 (two) times daily. 08/06/13   [provider]  famotidine (PEPCID) 20 MG tablet Take 1 tablet (20 mg total) by mouth at bedtime. 02/11/22 03/13/22  Lucilla Lame, MD  fluticasone-salmeterol [facility] INHUB) 250-50 MCG/ACT AEPB ONE puff in the in morning and ONE puff in the evening 06/24/22   Margaretha Seeds, MD  gabapentin (NEURONTIN) 300 MG capsule     [provider]  guaiFENesin (MUCINEX) 600 MG 12 hr tablet Take 1 tablet (600 mg total) by mouth 2 (two) times daily as needed for to loosen phlegm or cough. 03/20/22   Margaretha Seeds, MD  insulin glargine (LANTUS) 100 UNIT/ML injection 20 Units.    [provider]  ipratropium  (ATROVENT HFA) 17 MCG/ACT inhaler Inhale 2 puffs into the lungs in the morning, at noon, and at bedtime. Inhale 2 puffs every 6 hours three times a day 08/20/22   Margaretha Seeds, MD  JARDIANCE 10 MG TABS tablet Take 10 mg by mouth daily. 02/23/21   [provider]  metFORMIN (GLUCOPHAGE) 1000 MG tablet Take 1,000 mg by mouth 2 (two) times daily. 09/14/21   [provider]  psyllium (METAMUCIL SMOOTH TEXTURE) 58.6 % powder Take 1 packet by mouth daily. 12/20/21   Lucilla Lame, MD  Pyridoxine HCl (VITAMIN B6 PO) Take 1 tablet by mouth daily.     [provider]  QUEtiapine (SEROQUEL) 300 MG tablet Take 300 mg by mouth at bedtime.    [provider]  Semaglutide,0.25 or 0.5MG /DOS, (OZEMPIC, 0.25 OR 0.5 MG/DOSE,) 2 MG/1.5ML SOPN Inject 0.5 mg into the skin once a week.    [provider]  sitaGLIPtin (JANUVIA) 100 MG tablet Take 100 mg by mouth daily.    [provider]  Testosterone 20.25 MG/ACT (1.62%) GEL testosterone 20.25 mg/1.25 gram per pump act.(1.62 %) transdermal gel 12/24/21   [provider]      Allergies    Chlorzoxazone and Tramadol    Review of Systems   Review of Systems  Respiratory:  Positive for shortness of breath.   Cardiovascular:  Positive for chest pain.  All other systems reviewed and are negative.   Physical  Exam Updated Vital Signs BP (!) 150/75 (BP Location: Right Arm)   Pulse 68   Temp 98.4 F (36.9 C) (Oral)   Resp 18   Ht 5\' 8"  (1.727 m)   Wt 106.5 kg   SpO2 92%   BMI 35.69 kg/m  Physical Exam Vitals and nursing note reviewed.   71 year old male, resting comfortably and in no acute distress. Vital signs are ***. Oxygen saturation is ***%, which is normal. Head is normocephalic and atraumatic. PERRLA, EOMI. Oropharynx is clear. Neck is nontender and supple without adenopathy or JVD. Back is nontender and there is no CVA tenderness. Lungs are clear without rales, wheezes, or rhonchi. Chest is  nontender. Heart has regular rate and rhythm without murmur. Abdomen is soft, flat, nontender without masses or hepatosplenomegaly and peristalsis is normoactive. Extremities have no cyanosis or edema, full range of motion is present. Skin is warm and dry without rash. Neurologic: Mental status is normal, cranial nerves are intact, there are no motor or sensory deficits.  ED Results / Procedures / Treatments   Labs (all labs ordered are listed, but only abnormal results are displayed) Labs Reviewed  BASIC METABOLIC PANEL - Abnormal; Notable for the following components:      Result Value   Glucose, Bld 127 (*)    Creatinine, Ser 1.97 (*)    GFR, Estimated 36 (*)    All other components within normal limits  CBC WITH DIFFERENTIAL/PLATELET - Abnormal; Notable for the following components:   RBC 6.72 (*)    HCT 54.5 (*)    MCH 24.9 (*)    RDW 19.0 (*)    All other components within normal limits  TROPONIN I (HIGH SENSITIVITY)  TROPONIN I (HIGH SENSITIVITY)    EKG None  Radiology DG Chest 2 View  Result Date: 11/04/2022 CLINICAL DATA:  Chest pain, diaphoresis, sluggish, not thinking today, shortness of breath; history lung cancer EXAM: CHEST - 2 VIEW COMPARISON:  05/26/2021; correlation PET-CT 01/01/2012, CT chest 02/08/2015 FINDINGS: Upper normal size of cardiac silhouette. Mediastinal contours and pulmonary vascularity normal. Atherosclerotic calcification aorta. Bibasilar atelectasis versus scarring. New opacity LEFT mid lung due to costal bar at site prior fracture of the posterior LEFT sixth rib. No acute infiltrate, pleural effusion, or pneumothorax. Prior cervical spine fusion. IMPRESSION: Bibasilar atelectasis versus scarring. Aortic Atherosclerosis (ICD10-I70.0). Electronically Signed   By: Lavonia Dana M.D.   On: 11/04/2022 17:09    Procedures Procedures  {Document cardiac monitor, telemetry assessment procedure when appropriate:1}  Medications Ordered in ED Medications  - No data to display  ED Course/ Medical Decision Making/ A&P                           Medical Decision Making  ***  {Document critical care time when appropriate:1} {Document review of labs and clinical decision tools ie heart score, Chads2Vasc2 etc:1}  {Document your independent review of radiology images, and any outside records:1} {Document your discussion with family members, caretakers, and with consultants:1} {Document social determinants of health affecting pt's care:1} {Document your decision making why or why not admission, treatments were needed:1} Final Clinical Impression(s) / ED Diagnoses Final diagnoses:  None    Rx / DC Orders ED Discharge Orders     None

## 2022-11-04 NOTE — ED Provider Triage Note (Signed)
Emergency Medicine Provider Triage Evaluation Note  Laine Giovanetti , a 71 y.o. male  was evaluated in triage.  Pt complains of chest discomfort, diaphoresis, fatigue, and shortness of breath.  He states he has had progressively worsening fatigue for "some time".  He was at a doctor's appointment earlier today when he began to have chest discomfort and was sweating.  His doctor recommended he proceed to ED for further evaluation.  Denies fevers, cough, nausea, vomiting, diarrhea.  Review of Systems  Positive: As above Negative: As above  Physical Exam  BP (!) 154/92 (BP Location: Right Arm)   Pulse 71   Temp 98.9 F (37.2 C) (Oral)   Resp 20   Ht 5\' 8"  (1.727 m)   Wt 106.5 kg   SpO2 92%   BMI 35.69 kg/m  Gen:   Awake, no distress   Resp:  Normal effort  MSK:   Moves extremities without difficulty Other:  Shirt is moist over torso, skin is warm and dry  Medical Decision Making  Medically screening exam initiated at 4:30 PM.  Appropriate orders placed.  Darryon Bastin was informed that the remainder of the evaluation will be completed by another provider, this initial triage assessment does not replace that evaluation, and the importance of remaining in the ED until their evaluation is complete.     Theressa Stamps R, [place] 11/04/22 [phone]

## 2022-11-05 ENCOUNTER — Emergency Department (HOSPITAL_COMMUNITY): Payer: No Typology Code available for payment source

## 2022-11-05 LAB — URINALYSIS, ROUTINE W REFLEX MICROSCOPIC
Bacteria, UA: NONE SEEN
Bilirubin Urine: NEGATIVE
Glucose, UA: >=500 mg/dL — AB
Hgb urine dipstick: NEGATIVE
Ketones, ur: NEGATIVE mg/dL
Leukocytes,Ua: NEGATIVE
Nitrite: NEGATIVE
Protein, ur: NEGATIVE mg/dL
Specific Gravity, Urine: 1.021 (ref 1.005–1.030)
pH: 5 (ref 5.0–8.0)

## 2022-11-05 LAB — RESP PANEL BY RT-PCR (RSV, FLU A&B, COVID)  RVPGX2
Influenza A by PCR: NEGATIVE
Influenza B by PCR: NEGATIVE
Resp Syncytial Virus by PCR: NEGATIVE
SARS Coronavirus 2 by RT PCR: NEGATIVE

## 2022-11-05 LAB — TROPONIN I (HIGH SENSITIVITY): Troponin I (High Sensitivity): 12 ng/L (ref <18)

## 2022-11-05 LAB — CREATININE, SERUM
Creatinine, Ser: 1.35 mg/dL — ABNORMAL HIGH (ref 0.61–1.24)
GFR, Estimated: 56 mL/min — ABNORMAL LOW (ref >60)

## 2022-11-05 LAB — MAGNESIUM: Magnesium: 2.1 mg/dL (ref 1.7–2.4)

## 2022-11-05 MED ORDER — SODIUM CHLORIDE 0.9 % IV BOLUS
1000.0000 mL | Freq: Once | INTRAVENOUS | Status: AC
  Administered 2022-11-05: 1000 mL via INTRAVENOUS

## 2022-11-05 NOTE — Discharge Instructions (Signed)
Your evaluation did not show any sign of any serious illness.  Your kidneys are not working quite as well as they were before, but improved with IV fluids.  Please try to make sure he to drink enough fluids.  At any point, you are welcome to return for reevaluation if you have any new or concerning symptoms.

## 2022-11-05 NOTE — ED Notes (Signed)
Patient transported to CT 

## 2022-11-05 NOTE — ED Notes (Signed)
Magnesium add on in main lab

## 2022-11-20 ENCOUNTER — Encounter (HOSPITAL_BASED_OUTPATIENT_CLINIC_OR_DEPARTMENT_OTHER): Payer: Self-pay | Admitting: Pulmonary Disease

## 2022-11-20 ENCOUNTER — Ambulatory Visit (INDEPENDENT_AMBULATORY_CARE_PROVIDER_SITE_OTHER): Payer: No Typology Code available for payment source | Admitting: Pulmonary Disease

## 2022-11-20 VITALS — BP 130/64 | HR 68 | Ht 68.0 in | Wt 241.4 lb

## 2022-11-20 DIAGNOSIS — J4489 Other specified chronic obstructive pulmonary disease: Secondary | ICD-10-CM

## 2022-11-20 DIAGNOSIS — J441 Chronic obstructive pulmonary disease with (acute) exacerbation: Secondary | ICD-10-CM

## 2022-11-20 DIAGNOSIS — J439 Emphysema, unspecified: Secondary | ICD-10-CM

## 2022-11-20 MED ORDER — PREDNISONE 10 MG PO TABS: ORAL_TABLET | ORAL | 0 refills | Status: AC

## 2022-11-20 MED ORDER — PROMETHAZINE-DM 6.25-15 MG/5ML PO SYRP: 5.0000 mL | ORAL_SOLUTION | Freq: Four times a day (QID) | ORAL | 0 refills | Status: DC | PRN

## 2022-11-20 MED ORDER — SPIRIVA RESPIMAT 2.5 MCG/ACT IN AERS: 2.0000 | INHALATION_SPRAY | Freq: Every day | RESPIRATORY_TRACT | 5 refills | Status: DC

## 2022-11-20 NOTE — Patient Instructions (Signed)
  COPD exacerbation COPD with emphysema Moderate restrictive and obstructive defect in setting of lobectomy and emphysema --Prednisone taper. Call if symptoms persist and will consider evaluation for azithro if needed --RESTART Spiriva --CONTINUE Wixela ONE puff in the morning and ONE puff in the evening --CONTINUE Albuterol AS NEEDED for shortness of breath or wheezing --CONTINUE mucinex twice a day  --Promethazine-DM cough syrup QID PRN  Follow-up with me in March

## 2022-11-20 NOTE — Progress Notes (Signed)
Subjective:   PATIENT ID: Jose Patrick GENDER: male DOB: January 16, 1951, MRN: [id number]   HPI  Chief Complaint  Patient presents with   Follow-up    Getting over lung infection     Reason for Visit: Follow-up  [name] is a 72 year old male with hx lung cancer s/p left upper lobe lobectomy in 2013, COPD with emphysema, OSA nonadherent to CPAP, DM, HTN, reflux who presents for follow-up.  Synopsis He was diagnosed with COPD for > 30 years ago. At baseline has shortness of breath, non-productive coughing, chest congestion and wheezing however not optimized on bronchodilator regimen. Despite his symptoms he is active at baseline, swimming most days. Also enjoys the sauna to help his mind and breathing.   2023 - Completed Pulm Rehab in Jan. Intolerant of Spiriva due to throat irritation. Changed from Spiriva to Atrovent due to coverage. 2024 - Retrial Spiriva due to persistent symptoms.  11/20/21 Since our last visit he was switched to atrovent (nonpreferred). He reports recently taking two courses of antibiotics for exacerbation. He is still having wheezing and productive cough. He reports he is still having palpitations since his ablation. He was switched off beta blocker to Cardizem. He has had confusion with his atrovent inhaler and has called the office multiple times regarding its use. He is willing to try the Spiriva again.  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 Quit 10 years. 62 pack-years.   Past Medical History:  Diagnosis Date   ALLERGIC RHINITIS 10/08/2007   Qualifier: Diagnosis of  By: Jenny Reichmann MD, [place] PROSTATIC HYPERTROPHY 09/28/2010   Qualifier: Diagnosis of  By: Jenny Reichmann MD, Hunt Oris    Bipolar affective disorder ([place]) 09/30/2012   COLONIC POLYPS, HX OF 10/08/2007   Qualifier: Diagnosis of  By: Jenny Reichmann MD, Hunt Oris    Complication of anesthesia    sleeps awhile afterwards per patient   CONSTIPATION, CHRONIC, HX  OF 08/10/2007   Qualifier: Diagnosis of  By: [place], [place], TYPE II 10/08/2007   Qualifier: Diagnosis of  By: Jenny Reichmann MD, Hunt Oris    ERECTILE DYSFUNCTION 10/08/2007   Qualifier: Diagnosis of  By: Jenny Reichmann MD, Hunt Oris    HYPERLIPIDEMIA 10/08/2007   Qualifier: Diagnosis of  By: Jenny Reichmann MD, Hunt Oris    HYPERTENSION 03/20/2009   Qualifier: Diagnosis of  By: Jenny Reichmann MD, Hunt Oris    PTSD (post-traumatic stress disorder) 09/30/2012   SPINAL STENOSIS, LUMBAR 10/08/2007   Per Dr Arnoldo Morale - [facility]   Qualifier: Diagnosis of  By: Jenny Reichmann MD, Hunt Oris       Family History  Problem Relation Age of Onset   Cancer Mother    Ulcers Mother        Stomach   Depression Mother    Anxiety disorder Mother    Allergies Sister      Social History   Occupational History   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, 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  Tobacco Use   Smoking status: Former    Packs/day: 1.50    Years: 41.00    Total pack years: 61.50    Types: Cigarettes    Quit date: 2013    Years since quitting: 11.0   Smokeless tobacco: Never  Vaping Use   Vaping Use: Never used  Substance and Sexual Activity   Alcohol use:  Yes   Drug use: No   Sexual activity: Not on file    Allergies  Allergen Reactions   Chlorzoxazone Swelling     Outpatient Medications Prior to Visit  Medication Sig Dispense Refill   albuterol (VENTOLIN HFA) 108 (90 Base) MCG/ACT inhaler Inhale 2 puffs into the lungs every 6 (six) hours as needed for wheezing or shortness of breath. 18 g 5   amLODipine (NORVASC) 10 MG tablet amlodipine 10 mg tablet     aspirin 81 MG chewable tablet Chew 1 tablet (81 mg total) by mouth daily. 90 tablet 3   Cholecalciferol (VITAMIN D) 2000 UNITS CAPS Take 1 capsule by mouth 2 (two) times daily.      clonazePAM (KLONOPIN) 0.5 MG tablet Take 0.5 mg by mouth 2 (two) times daily.     fluticasone-salmeterol (WIXELA INHUB) 250-50  MCG/ACT AEPB ONE puff in the in morning and ONE puff in the evening 180 each 1   gabapentin (NEURONTIN) 300 MG capsule      guaiFENesin (MUCINEX) 600 MG 12 hr tablet Take 1 tablet (600 mg total) by mouth 2 (two) times daily as needed for to loosen phlegm or cough. 180 tablet 6   insulin glargine (LANTUS) 100 UNIT/ML injection 20 Units.     ipratropium (ATROVENT HFA) 17 MCG/ACT inhaler Inhale 2 puffs into the lungs in the morning, at noon, and at bedtime. Inhale 2 puffs every 6 hours three times a day 1 each 5   JARDIANCE 10 MG TABS tablet Take 10 mg by mouth daily.     metFORMIN (GLUCOPHAGE) 1000 MG tablet Take 1,000 mg by mouth 2 (two) times daily.     psyllium (METAMUCIL SMOOTH TEXTURE) 58.6 % powder Take 1 packet by mouth daily. 90 packet 1   Pyridoxine HCl (VITAMIN B6 PO) Take 1 tablet by mouth daily.      QUEtiapine (SEROQUEL) 300 MG tablet Take 300 mg by mouth at bedtime.     Semaglutide,0.25 or 0.5MG /DOS, (OZEMPIC, 0.25 OR 0.5 MG/DOSE,) 2 MG/1.5ML SOPN Inject 0.5 mg into the skin once a week.     sitaGLIPtin (JANUVIA) 100 MG tablet Take 100 mg by mouth daily.     Testosterone 20.25 MG/ACT (1.62%) GEL testosterone 20.25 mg/1.25 gram per pump act.(1.62 %) transdermal gel     famotidine (PEPCID) 20 MG tablet Take 1 tablet (20 mg total) by mouth at bedtime. 90 tablet 2   No facility-administered medications prior to visit.    Review of Systems  Constitutional:  Negative for chills, diaphoresis, fever, malaise/fatigue and weight loss.  HENT:  Negative for congestion.   Respiratory:  Positive for cough, sputum production, shortness of breath and wheezing. Negative for hemoptysis.   Cardiovascular:  Negative for chest pain, palpitations and leg swelling.     Objective:   Vitals:   11/20/22 1105  BP: 130/64  Pulse: 68  SpO2: 97%  Weight: 241 lb 6.4 oz (109.5 kg)  Height: 5\' 8"  (1.727 m)   SpO2: 97 % O2 Device: None (Room air)  Physical Exam: General: Well-appearing, no acute  distress HENT: [place], AT Eyes: EOMI, no scleral icterus Respiratory: Diminished breath sounds to auscultation bilaterally.  No crackles, wheezing or rales Cardiovascular: RRR, -M/R/G, no JVD Extremities:-Edema,-tenderness Neuro: AAO x4, CNII-XII grossly intact Psych: Normal mood, normal affect  Data Reviewed:  Imaging: PET/CT 01/01/12 2.2 cm LUL hypermetabolic nodule in LUL. Paraseptal and centrilobular emphysema. CXR 11/04/22 - Bibasilar atelectasis  PFT: 04/29/22 FVC 2.49 (70%) FEV1 1.59 (59%) Ratio 64  TLC 70% DLCO 66% Interpretation: Moderately severe obstructive and restrictive defect with mildly reduced DLCO.  Labs: CBC    Component Value Date/Time   WBC 7.6 11/04/2022 1709   RBC 6.72 (H) 11/04/2022 1709   HGB 16.7 11/04/2022 1709   HCT 54.5 (H) 11/04/2022 1709   PLT 222 11/04/2022 1709   MCV 81.1 11/04/2022 1709   MCH 24.9 (L) 11/04/2022 1709   MCHC 30.6 11/04/2022 1709   RDW 19.0 (H) 11/04/2022 1709   LYMPHSABS 2.6 11/04/2022 1709   MONOABS 0.8 11/04/2022 1709   EOSABS 0.1 11/04/2022 1709   BASOSABS 0.0 11/04/2022 1709   Abs eos 05/26/21 - 0     Assessment & Plan:   Discussion: 72 year old male with hx lung cancer s/p LUL lobectomy in 2013, COPD with emphysema, OSA not on CPAP, DM2, HTN, reflux who presents for follow-up. Persistent symptoms on Wixela and atrovent. Previously complained of throat irritation related to Spiriva but willing to try again. Discussed clinical course and management of COPD including bronchodilator regimen and action plan for exacerbation.  COPD exacerbation COPD with emphysema Moderate restrictive and obstructive defect in setting of lobectomy and emphysema --Prednisone taper --RESTART Spiriva --CONTINUE Wixela ONE puff in the morning and ONE puff in the evening --CONTINUE Albuterol AS NEEDED for shortness of breath or wheezing --CONTINUE mucinex twice a day  --Promethazine-DM cough syrup QID PRN  OSA Managed at [facility]  Bradycardia,  palpitations --Follow-up with Cardiology when scheduled  Health Maintenance Immunization History  Administered Date(s) Administered   Fluad Quad(high Dose 65+) 07/31/2020, 09/21/2021   Influenza Split 09/19/2014   Influenza Whole 10/12/2007, 09/28/2010   Influenza, High Dose Seasonal PF 09/03/2017, 08/13/2019   Influenza, Seasonal, Injecte, Preservative Fre 08/12/2016   Influenza-Unspecified 08/06/2011, 10/18/2012, 08/26/2013, 09/13/2015   Moderna Covid-19 Vaccine Bivalent Booster 30yrs & up 09/21/2021   Moderna Sars-Covid-2 Vaccination 12/22/2019, 01/19/2020, 11/02/2020   Pneumococcal Conjugate-13 11/21/2016   Pneumococcal Polysaccharide-23 12/06/2008, 01/19/2018   Pneumococcal-Unspecified 11/29/2011   Td 12/06/2008   Tdap 11/29/2011, 10/16/2021   Zoster Recombinat (Shingrix) 02/24/2019, 05/27/2019   Zoster, Live 11/03/2012   CT Lung Screen - hx lung cancer. Followed by Oncology at the [facility]  No orders of the defined types were placed in this encounter.  Meds ordered this encounter  Medications   Tiotropium Bromide Monohydrate (SPIRIVA RESPIMAT) 2.5 MCG/ACT AERS    Sig: Inhale 2 puffs into the lungs daily.    Dispense:  4 g    Refill:  5   predniSONE (DELTASONE) 10 MG tablet    Sig: Take 4 tablets (40 mg total) by mouth daily with breakfast for 3 days, THEN 3 tablets (30 mg total) daily with breakfast for 3 days, THEN 2 tablets (20 mg total) daily with breakfast for 3 days, THEN 1 tablet (10 mg total) daily with breakfast for 3 days.    Dispense:  30 tablet    Refill:  0   promethazine-dextromethorphan (PROMETHAZINE-DM) 6.25-15 MG/5ML syrup    Sig: Take 5 mLs by mouth 4 (four) times daily as needed for cough.    Dispense:  118 mL    Refill:  0    Return in about 3 months (around 02/19/2023).  I have spent a total time of 31-minutes on the day of the appointment including chart review, data review, collecting history, coordinating care and discussing medical diagnosis and plan  with the patient/family. Past medical history, allergies, medications were reviewed. Pertinent imaging, labs and tests included in this note have  been reviewed and interpreted independently by me.  [place], MD [facility] 11/20/2022 11:26 AM  Office Number [phone]

## 2022-11-21 ENCOUNTER — Telehealth (HOSPITAL_BASED_OUTPATIENT_CLINIC_OR_DEPARTMENT_OTHER): Payer: Self-pay | Admitting: Pulmonary Disease

## 2022-11-22 NOTE — Telephone Encounter (Signed)
Cough suppressants are non-formulary other than the Tessalon Pearls which may still not be covered- this is usually one of the cheaper options other than OTC alternatives.

## 2022-12-04 ENCOUNTER — Telehealth: Payer: Self-pay | Admitting: Pulmonary Disease

## 2022-12-04 MED ORDER — PSEUDOEPH-BROMPHEN-DM 30-2-10 MG/5ML PO SYRP: 5.0000 mL | ORAL_SOLUTION | Freq: Four times a day (QID) | ORAL | 0 refills | Status: DC | PRN

## 2022-12-04 MED ORDER — PREDNISONE 10 MG PO TABS: 40.0000 mg | ORAL_TABLET | Freq: Every day | ORAL | 0 refills | Status: AC

## 2022-12-04 MED ORDER — AMOXICILLIN-POT CLAVULANATE 875-125 MG PO TABS: 1.0000 | ORAL_TABLET | Freq: Two times a day (BID) | ORAL | 0 refills | Status: DC

## 2022-12-04 NOTE — Telephone Encounter (Signed)
Persistent symptoms for COPD exacerbation  Rx augmentin Rx prednisone Advise patient to buy over-the counter cough syrup

## 2022-12-04 NOTE — Telephone Encounter (Signed)
[facility], states they do have Robitussin dm. States done with antibiotics and still coughing up green mucus.

## 2022-12-04 NOTE — Telephone Encounter (Signed)
Spk to pt let me know provider sent in Rx to [facility] pharmacy

## 2022-12-05 ENCOUNTER — Other Ambulatory Visit: Payer: Self-pay | Admitting: Pulmonary Disease

## 2022-12-05 NOTE — Telephone Encounter (Signed)
Note sent from pharmacy. Bromfed DM not available. Please send separate scripts for Tussin DM and Pseudofedrine tablets. Dr. Everardo All please advise.

## 2022-12-05 NOTE — Telephone Encounter (Signed)
Spk to patient advised him he will need to purchase robitussin otc

## 2022-12-30 NOTE — Progress Notes (Unsigned)
Cardiology Office Note:    Date:  01/02/2023   ID:  Jose Patrick, DOB [birth year], MRN [id number]  PCP:  Administration, [place] Providers Cardiologist:  None   Referring MD: [facility], [facility]    History of Present Illness:    Jose Patrick is a 72 y.o. male with a hx of bipolar disorder, PTSD, DMII, HLD, and symptomatic PVCs s/p ablation at [facility] who was referred by the [facility] for further evaluation of palpitations and SOB.   Patient was seen by Cardiology at [facility] on 10/2022. Note reviewed. He has had a PVC ablation but continued to have PVCs post-ablation. There was concern that his PVCs may be epicardial in nature. At that visit, his metop was changed to dilt 240mg  daily. Prior myoview 07/2022 with no ischemia. TTE 07/2022 with EF 55-60%, G1DD, normal RV, no significant valve disease. Prior cardiac monitor (preceded ablation) with 25.5% burden of PVCs.  Today, the patient states that he does feel well due to continued to PVCs. Symptoms have progressed since his ablation 10/21/22. Specifically he feels chest discomfort, palpitations and fatigue when the PVCs occur. No lightheadedness, syncope, or dizziness. Notably, he has chronic SOB with exertion and occasionally at rest which he attributes to severe COPD.   Past Medical History:  Diagnosis Date   ALLERGIC RHINITIS 10/08/2007   Qualifier: Diagnosis of  By: Jenny Reichmann MD, [place] PROSTATIC HYPERTROPHY 09/28/2010   Qualifier: Diagnosis of  By: Jenny Reichmann MD, Hunt Oris    Bipolar affective disorder ([place]) 09/30/2012   COLONIC POLYPS, HX OF 10/08/2007   Qualifier: Diagnosis of  By: Jenny Reichmann MD, Hunt Oris    Complication of anesthesia    sleeps awhile afterwards per patient   CONSTIPATION, CHRONIC, HX OF 08/10/2007   Qualifier: Diagnosis of  By: Sherwood, [place], TYPE II 10/08/2007   Qualifier: Diagnosis of  By: Jenny Reichmann MD, Hunt Oris    ERECTILE DYSFUNCTION 10/08/2007   Qualifier:  Diagnosis of  By: Jenny Reichmann MD, Hunt Oris    HYPERLIPIDEMIA 10/08/2007   Qualifier: Diagnosis of  By: Jenny Reichmann MD, Hunt Oris    HYPERTENSION 03/20/2009   Qualifier: Diagnosis of  By: Jenny Reichmann MD, Hunt Oris    PTSD (post-traumatic stress disorder) 09/30/2012   SPINAL STENOSIS, LUMBAR 10/08/2007   Per Dr Arnoldo Morale - [facility]   Qualifier: Diagnosis of  By: Jenny Reichmann MD, Hunt Oris      Past Surgical History:  Procedure Laterality Date   COLONOSCOPY WITH PROPOFOL N/A 04/09/2021   Procedure: COLONOSCOPY WITH PROPOFOL;  Surgeon: Virgel Manifold, MD;  Location: [facility] ENDOSCOPY;  Service: Endoscopy;  Laterality: N/A;   cyst off right buttock     ESOPHAGOGASTRODUODENOSCOPY (EGD) WITH PROPOFOL N/A 04/09/2021   Procedure: ESOPHAGOGASTRODUODENOSCOPY (EGD) WITH PROPOFOL;  Surgeon: Virgel Manifold, MD;  Location: [facility] ENDOSCOPY;  Service: Endoscopy;  Laterality: N/A;   PREPATELLAR BURSA EXCISION     removal right   s/p bilat hydrocelectomy  2008   x 2   TONSILLECTOMY      Current Medications: Current Meds  Medication Sig   albuterol (VENTOLIN HFA) 108 (90 Base) MCG/ACT inhaler Inhale 2 puffs into the lungs every 6 (six) hours as needed for wheezing or shortness of breath.   amLODipine (NORVASC) 10 MG tablet amlodipine 10 mg tablet   amoxicillin-clavulanate (AUGMENTIN) 875-125 MG tablet Take 1 tablet by mouth 2 (two) times daily.   aspirin 81 MG chewable tablet  Chew 1 tablet (81 mg total) by mouth daily.   Cholecalciferol (VITAMIN D) 2000 UNITS CAPS Take 1 capsule by mouth 2 (two) times daily.    clonazePAM (KLONOPIN) 0.5 MG tablet Take 0.5 mg by mouth 2 (two) times daily.   dextromethorphan-guaiFENesin (TUSSIN DM) 10-100 MG/5ML liquid Take 5 mLs by mouth every 4 (four) hours as needed for cough.   fluticasone-salmeterol (WIXELA INHUB) 250-50 MCG/ACT AEPB ONE puff in the in morning and ONE puff in the evening   gabapentin (NEURONTIN) 300 MG capsule    guaiFENesin (MUCINEX) 600 MG 12 hr tablet Take 1  tablet (600 mg total) by mouth 2 (two) times daily as needed for to loosen phlegm or cough.   insulin glargine (LANTUS) 100 UNIT/ML injection 20 Units.   JARDIANCE 10 MG TABS tablet Take 10 mg by mouth daily.   metFORMIN (GLUCOPHAGE) 1000 MG tablet Take 1,000 mg by mouth 2 (two) times daily.   metoprolol tartrate (LOPRESSOR) 25 MG tablet Take 25 mg by mouth daily. Take 1/2 tablet at bedtime.   promethazine-dextromethorphan (PROMETHAZINE-DM) 6.25-15 MG/5ML syrup Take 5 mLs by mouth 4 (four) times daily as needed for cough.   psyllium (METAMUCIL SMOOTH TEXTURE) 58.6 % powder Take 1 packet by mouth daily.   Pyridoxine HCl (VITAMIN B6 PO) Take 1 tablet by mouth daily.    QUEtiapine (SEROQUEL) 300 MG tablet Take 300 mg by mouth at bedtime.   Semaglutide,0.25 or 0.5MG /DOS, (OZEMPIC, 0.25 OR 0.5 MG/DOSE,) 2 MG/1.5ML SOPN Inject 0.5 mg into the skin once a week.   sitaGLIPtin (JANUVIA) 100 MG tablet Take 100 mg by mouth daily.   Testosterone 20.25 MG/ACT (1.62%) GEL testosterone 20.25 mg/1.25 gram per pump act.(1.62 %) transdermal gel   Tiotropium Bromide Monohydrate (SPIRIVA RESPIMAT) 2.5 MCG/ACT AERS Inhale 2 puffs into the lungs daily.     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  Tobacco Use   Smoking status: Former    Packs/day: 1.50    Years: 41.00    Total pack years: 61.50    Types: Cigarettes    Quit date: 2013    Years since quitting: 11.1   Smokeless tobacco: Never  Vaping Use   Vaping Use: Never used  Substance and Sexual Activity   Alcohol use: Yes   Drug use: No   Sexual activity: Not on file  Other Topics Concern   Not on file  Social History Narrative   Not  on file   Social Determinants of Health   Financial Resource Strain: Not on file  Food Insecurity: Not on file  Transportation Needs: Not on file  Physical Activity: Not on file  Stress: Not on file  Social Connections: Not on file     Family History: The patient's family history includes Allergies in his sister; Anxiety disorder in his mother; Cancer in his mother; Depression in his mother; Ulcers in his mother.  ROS:   Please see the history of present illness.     All other systems reviewed and are negative.  EKGs/Labs/Other Studies Reviewed:    The following studies were reviewed today: Myoview 07/2022: TID ratio =  0.76   Impression  1. There is no evidence of inducible ischemia by myocardial perfusion imaging.  2.  Mildly reduced left ventricular systolic function.  3.  No ECG changes noted with pharmacological stressor to suggest ischemia. PVCs noted throughout the study.   TTE 08/01/22: Left Ventricle: Left ventricle size is normal.    Left Ventricle: Systolic function is normal. EF: 55-60%.    Left Ventricle: Doppler parameters consistent with mild diastolic  dysfunction and low to normal LA pressure.    Right Ventricle: Right ventricle size is normal.    Right Ventricle: Systolic function is normal.    Tricuspid Valve: The right ventricular systolic pressure is normal (<36  mmHg).   No significant valvular abnormalities.   Cardiac Monitor 07/2022: Indication: Palpitations.   Underlying rhythm is Sinus.   The minimum heart rate was 53 bpm at 4:01 AM; the maximum 116 bpm; the average 79 bpm.   Patient remained bradycardic 0.5% of total time.  Patient remained tachycardic 1.3% of total time.   6 supraventricular episodes were found. Patient was asymptomatic.  Longest SVT Episode 17 beat run with HR 137 at 1:49 AM.  Fastest SVT 3 beat run with HR 152 bpm at 2:38 PM.   Very frequent PVCs noted. There were a total of 157,106 PVCs with 275 couplets. Overall  PVC Burden at 25.5 %.  191,478 PVCs noted in bigeminal pattern (21.7%).  10,964 PVCs noted in trigeminal pattern (1.8%).   There were a total of only 1799 PSVCs with 0 couplets. Overall PSVC Burden at 0.3 %.   There is a total of 0 patient events.   The longest R-R interval is 1.1 seconds.   The average Qtc 410 msec. Min Qtc 366 msec, maximum Qtc 438 msec.  Qtc was > 460 msec 1 % of total time.    EKG:  EKG is no ordered today.    Recent Labs: 11/04/2022: BUN 23; Hemoglobin 16.7; Magnesium 2.1; Platelets 222; Potassium 4.9; Sodium 141 11/05/2022: Creatinine, Ser 1.35  Recent Lipid Panel    Component Value Date/Time   CHOL 142 09/30/2012 1031   TRIG 83.0 09/30/2012 1031   HDL 40.90 09/30/2012 1031   CHOLHDL 3 09/30/2012 1031   VLDL 16.6 09/30/2012 1031   LDLCALC 85 09/30/2012 1031   LDLDIRECT 147.8 03/20/2009 1521     Risk Assessment/Calculations:                Physical Exam:    VS:  BP 120/74   Pulse 88   Wt 240 lb (108.9 kg)   SpO2 96%   BMI 36.49 kg/m     Wt Readings from Last 3 Encounters:  01/02/23 240 lb (108.9 kg)  11/20/22 241 lb 6.4 oz (109.5 kg)  11/04/22 234 lb 11.2 oz (106.5 kg)     GEN:  Well nourished, well developed in no acute distress HEENT: Normal NECK: No JVD; No carotid bruits CARDIAC: RRR with occasional PVCs during exam. No murmurs RESPIRATORY: Diminished but clear ABDOMEN: Soft, non-tender, non-distended MUSCULOSKELETAL:  No edema; No deformity  SKIN: Warm and dry NEUROLOGIC:  Alert and oriented x 3 PSYCHIATRIC:  Normal affect   ASSESSMENT:    1. Frequent PVCs   2. Essential hypertension   3. Diabetes mellitus with coincident hypertension ([place])   4. Pulmonary emphysema, unspecified emphysema type ([place])    PLAN:    In order of problems listed above:  #Symptomatic PVCs s/p ablation: Cardiac monitor 07/2022 with 25% burden of PVCs. TTE  with LVEF 55-60%, no significant valve disease and myoview with no reported ischemia.  Underwent ablation at [facility] in 10/2022 but with persistent symptoms. There was discussion that he may need an epicardial ablation, however, this has yet to be performed and patient wishes to transfer care. Will repeat monitor to reassess PVC burden and refer to EP at this time. -Repeat zio monitor -Continue dilt 240mg  daily -Continue metoprolol 25mg  BID -Refer to EP for further evaluation  #HTN: Well controlled and at goal -Continue dilt 240mg  daily -Continue metoprolol 25mg  BID -Continue losartan 25mg  daily  #DMII: Follows with Endocrinology. -On insulin, jardiance and ozempic  #Severe COPD: -Management per PCP           Medication Adjustments/Labs and Tests Ordered: Current medicines are reviewed at length with the patient today.  Concerns regarding medicines are outlined above.  Orders Placed This Encounter  Procedures   Ambulatory referral to Cardiac Electrophysiology   LONG TERM MONITOR (3-14 DAYS)   No orders of the defined types were placed in this encounter.   Patient Instructions  Medication Instructions:   Your physician recommends that you continue on your current medications as directed. Please refer to the Current Medication list given to you today.  *If you need a refill on your cardiac medications before your next appointment, please call your pharmacy*   You have been referred to SEE ELECTROPHYSIOLOGIST DR. WILL CAMNITZ FOR NOTED PVC BURDEN ON MONITOR AND RECENT UNSUCCESSFUL PVC ABLATION    Testing/Procedures:  ZIO XT- Long Term Monitor Instructions  Your physician has requested you wear a ZIO patch monitor for 3 days.  This is a single patch monitor. [employer] supplies one patch monitor per enrollment. Additional stickers are not available. Please do not apply patch if you will be having a Nuclear Stress Test,  Echocardiogram, Cardiac CT, MRI, or Chest Xray during the period you would be wearing the  monitor. The patch cannot be worn during these  tests. You cannot remove and re-apply the  ZIO XT patch monitor.  Your ZIO patch monitor will be mailed 3 day USPS to your address on file. It may take 3-5 days  to receive your monitor after you have been enrolled.  Once you have received your monitor, please review the enclosed instructions. Your monitor  has already been registered assigning a specific monitor serial # to you.  Billing and Patient Assistance Program Information  We have supplied [employer] with any of your insurance information on file for billing purposes. [employer] offers a sliding scale Patient Assistance Program for patients that do not have  insurance, or whose insurance does not completely cover the cost of the ZIO monitor.  You must apply for the Patient Assistance Program to qualify for this discounted rate.  To apply, please call [employer] at [phone], select option 4, select option 2, ask to apply for  Patient Assistance Program. Theodore Demark will ask your household income, and how many people  are in your household. They will quote your out-of-pocket cost based on that information.  [employer] will also be able to set up a 70-month, interest-free payment plan if needed.  Applying the monitor   Shave hair from upper left chest.  Hold abrader disc by orange tab. Rub abrader in 40 strokes over the upper left chest as  indicated in your monitor instructions.  Clean area with 4 enclosed alcohol pads. Let dry.  Apply patch as indicated in monitor instructions. Patch will be placed under collarbone on left  side  of chest with arrow pointing upward.  Rub patch adhesive wings for 2 minutes. Remove white label marked "1". Remove the white  label marked "2". Rub patch adhesive wings for 2 additional minutes.  While looking in a mirror, press and release button in center of patch. A small green light will  flash 3-4 times. This will be your only indicator that the monitor has been turned on.  Do not shower for the first 24  hours. You may shower after the first 24 hours.  Press the button if you feel a symptom. You will hear a small click. Record Date, Time and  Symptom in the Patient Logbook.  When you are ready to remove the patch, follow instructions on the last 2 pages of Patient  Logbook. Stick patch monitor onto the last page of Patient Logbook.  Place Patient Logbook in the blue and white box. Use locking tab on box and tape box closed  securely. The blue and white box has prepaid postage on it. Please place it in the mailbox as  soon as possible. Your physician should have your test results approximately 7 days after the  monitor has been mailed back to [facility].  Call [place] at [phone] if you have questions regarding  your ZIO XT patch monitor. Call them immediately if you see an orange light blinking on your  monitor.  If your monitor falls off in less than 4 days, contact our Monitor department at [phone].  If your monitor becomes loose or falls off after 4 days call [employer] at [phone] for  suggestions on securing your monitor    Follow-Up:  3 MONTHS WITH AN EXTENDER--PLEASE SCHEDULE WITH AN EXTENDER PER DR. Johney Frame     Signed, [name], MD  01/02/2023 10:37 AM    [place]

## 2023-01-02 ENCOUNTER — Ambulatory Visit: Payer: No Typology Code available for payment source | Attending: Cardiology | Admitting: Cardiology

## 2023-01-02 ENCOUNTER — Telehealth: Payer: Self-pay | Admitting: *Deleted

## 2023-01-02 ENCOUNTER — Ambulatory Visit (INDEPENDENT_AMBULATORY_CARE_PROVIDER_SITE_OTHER): Payer: No Typology Code available for payment source

## 2023-01-02 ENCOUNTER — Encounter: Payer: Self-pay | Admitting: Cardiology

## 2023-01-02 VITALS — BP 120/74 | HR 88 | Wt 240.0 lb

## 2023-01-02 DIAGNOSIS — I493 Ventricular premature depolarization: Secondary | ICD-10-CM

## 2023-01-02 DIAGNOSIS — E119 Type 2 diabetes mellitus without complications: Secondary | ICD-10-CM | POA: Diagnosis not present

## 2023-01-02 DIAGNOSIS — I10 Essential (primary) hypertension: Secondary | ICD-10-CM | POA: Diagnosis not present

## 2023-01-02 DIAGNOSIS — J439 Emphysema, unspecified: Secondary | ICD-10-CM | POA: Diagnosis not present

## 2023-01-02 NOTE — Progress Notes (Unsigned)
Enrolled for [employer] to mail a ZIO XT long term holter monitor to the patients address on file.  

## 2023-01-02 NOTE — Telephone Encounter (Signed)
-----   Message from Jennefer Bravo sent at 01/02/2023 10:26 AM EST ----- Regarding: RE: 3 DAY ZIO PER DR. Johney Frame done ----- Message ----- From: Nuala Alpha, LPN Sent: [phone]  10:22 AM EST To: Jennefer Bravo; Katrina Cloyd Stagers Subject: 3 DAY ZIO PER DR. Johney Frame                    Dr. Johney Frame ordered a 3 day zio on this pt for frequent PVCs  Please enroll and let me know when you do?  Thanks [employer]

## 2023-01-02 NOTE — Patient Instructions (Signed)
Medication Instructions:   Your physician recommends that you continue on your current medications as directed. Please refer to the Current Medication list given to you today.  *If you need a refill on your cardiac medications before your next appointment, please call your pharmacy*   You have been referred to SEE ELECTROPHYSIOLOGIST DR. WILL CAMNITZ FOR NOTED PVC BURDEN ON MONITOR AND RECENT UNSUCCESSFUL PVC ABLATION    Testing/Procedures:  ZIO XT- Long Term Monitor Instructions  Your physician has requested you wear a ZIO patch monitor for 3 days.  This is a single patch monitor. [employer] supplies one patch monitor per enrollment. Additional stickers are not available. Please do not apply patch if you will be having a Nuclear Stress Test,  Echocardiogram, Cardiac CT, MRI, or Chest Xray during the period you would be wearing the  monitor. The patch cannot be worn during these tests. You cannot remove and re-apply the  ZIO XT patch monitor.  Your ZIO patch monitor will be mailed 3 day USPS to your address on file. It may take 3-5 days  to receive your monitor after you have been enrolled.  Once you have received your monitor, please review the enclosed instructions. Your monitor  has already been registered assigning a specific monitor serial # to you.  Billing and Patient Assistance Program Information  We have supplied [employer] with any of your insurance information on file for billing purposes. [employer] offers a sliding scale Patient Assistance Program for patients that do not have  insurance, or whose insurance does not completely cover the cost of the ZIO monitor.  You must apply for the Patient Assistance Program to qualify for this discounted rate.  To apply, please call [employer] at [phone], select option 4, select option 2, ask to apply for  Patient Assistance Program. Theodore Demark will ask your household income, and how many people  are in your household. They will quote your  out-of-pocket cost based on that information.  [employer] will also be able to set up a 70-month, interest-free payment plan if needed.  Applying the monitor   Shave hair from upper left chest.  Hold abrader disc by orange tab. Rub abrader in 40 strokes over the upper left chest as  indicated in your monitor instructions.  Clean area with 4 enclosed alcohol pads. Let dry.  Apply patch as indicated in monitor instructions. Patch will be placed under collarbone on left  side of chest with arrow pointing upward.  Rub patch adhesive wings for 2 minutes. Remove white label marked "1". Remove the white  label marked "2". Rub patch adhesive wings for 2 additional minutes.  While looking in a mirror, press and release button in center of patch. A small green light will  flash 3-4 times. This will be your only indicator that the monitor has been turned on.  Do not shower for the first 24 hours. You may shower after the first 24 hours.  Press the button if you feel a symptom. You will hear a small click. Record Date, Time and  Symptom in the Patient Logbook.  When you are ready to remove the patch, follow instructions on the last 2 pages of Patient  Logbook. Stick patch monitor onto the last page of Patient Logbook.  Place Patient Logbook in the blue and white box. Use locking tab on box and tape box closed  securely. The blue and white box has prepaid postage on it. Please place it in the mailbox as  soon as possible.  Your physician should have your test results approximately 7 days after the  monitor has been mailed back to [facility].  Call [place] at [phone] if you have questions regarding  your ZIO XT patch monitor. Call them immediately if you see an orange light blinking on your  monitor.  If your monitor falls off in less than 4 days, contact our Monitor department at [phone].  If your monitor becomes loose or falls off after 4 days call [employer] at  [phone] for  suggestions on securing your monitor    Follow-Up:  3 MONTHS WITH AN EXTENDER--PLEASE SCHEDULE WITH AN EXTENDER PER DR. Johney Frame

## 2023-01-07 DIAGNOSIS — I493 Ventricular premature depolarization: Secondary | ICD-10-CM | POA: Diagnosis not present

## 2023-01-24 ENCOUNTER — Ambulatory Visit (INDEPENDENT_AMBULATORY_CARE_PROVIDER_SITE_OTHER): Payer: No Typology Code available for payment source | Admitting: Pulmonary Disease

## 2023-01-24 ENCOUNTER — Telehealth: Payer: Self-pay | Admitting: Cardiology

## 2023-01-24 ENCOUNTER — Encounter (HOSPITAL_BASED_OUTPATIENT_CLINIC_OR_DEPARTMENT_OTHER): Payer: Self-pay | Admitting: Pulmonary Disease

## 2023-01-24 VITALS — BP 138/62 | HR 88 | Ht 68.0 in | Wt 241.6 lb

## 2023-01-24 DIAGNOSIS — J441 Chronic obstructive pulmonary disease with (acute) exacerbation: Secondary | ICD-10-CM | POA: Diagnosis not present

## 2023-01-24 DIAGNOSIS — R911 Solitary pulmonary nodule: Secondary | ICD-10-CM

## 2023-01-24 MED ORDER — PREDNISONE 10 MG PO TABS: ORAL_TABLET | ORAL | 0 refills | Status: AC

## 2023-01-24 NOTE — Telephone Encounter (Signed)
Spoke with the pt and informed him that he can cancel the appt with his old EP Provider with [facility], and keep his appt with our EP Provider Dr. Curt Bears on 3/22.   We referred the pt to Dr. Curt Bears for 3/22, back at his last OV with Dr. Johney Frame.  We referred him to Dr. Curt Bears for follow-up of his PVC's and recent PVC ablation he had with [facility].  Pt verbalized understanding and agrees with this plan.

## 2023-01-24 NOTE — Patient Instructions (Addendum)
New right lower lobe nodule 10 mm Hx lung cancer s/p LUL lobectomy 2013 --Provided reassurance with recommendation from multidisciplinary team on tumor board. Patient prefers to obtain imaging here at [facility] --ORDER CT Chest with contrast in 3 months  COPD exacerbation COPD with emphysema Moderate restrictive and obstructive defect in setting of lobectomy and emphysema --Prednisone taper --CONTINUE Spiriva TWO puffs in the morning --CONTINUE Wixela 250-50 ONE puff in the morning and ONE puff in the evening --CONTINUE Albuterol AS NEEDED for shortness of breath or wheezing --CONTINUE mucinex twice a day  --Not a candidate for chronic macrolide due to arrhythmia and on other QTC prolongation drugs  Follow-up with me in 3 months

## 2023-01-24 NOTE — Telephone Encounter (Signed)
Pt is returning call.  

## 2023-01-24 NOTE — Telephone Encounter (Signed)
Left the pt a message to call the office back. 

## 2023-01-24 NOTE — Telephone Encounter (Signed)
Pt called in asking for advice from Dr. Johney Frame on if he needs to no longer see the doctor that did his ablation, Dr. Phylliss Blakes with [facility] in [place]. Pt has an appt with Dr. Deno Etienne on 5/7.  Pt also requested to know his results from the heart monitor he wore. Advised pt he has an appt with Dr. Curt Bears on 3/22. Pt states he doesn't want to wait that long to know his results.

## 2023-01-24 NOTE — Progress Notes (Unsigned)
Subjective:   PATIENT ID: Jose Patrick GENDER: male DOB: 1951-06-11, MRN: [id number]   HPI  Chief Complaint  Patient presents with   Follow-up    PET scan dx cancer both lungs    Reason for Visit: Follow-up  [name] is a 72 year old male with hx lung cancer s/p left upper lobe lobectomy in 2013, COPD with emphysema, OSA nonadherent to CPAP, DM, HTN, reflux who presents for follow-up.  Synopsis He was diagnosed with COPD for > 30 years ago. At baseline has shortness of breath, non-productive coughing, chest congestion and wheezing however not optimized on bronchodilator regimen. Despite his symptoms he is active at baseline, swimming most days. Also enjoys the sauna to help his mind and breathing.   2023 - Completed Pulm Rehab in Jan. Intolerant of Spiriva due to throat irritation. Changed from Spiriva to Atrovent due to coverage. 2024 - Retrial Spiriva due to persistent symptoms.  11/20/21 Since our last visit he was switched to atrovent (nonpreferred). He reports recently taking two courses of antibiotics for exacerbation. He is still having wheezing and productive cough. He reports he is still having palpitations since his ablation. He was switched off beta blocker to Cardizem. He has had confusion with his atrovent inhaler and has called the office multiple times regarding its use. He is willing to try the Spiriva again.  01/24/23 Since our last visit he has PET scan which demonstrated hypermetabolic right lower lobe pulmonary nodule measuring 47m. No mediastinal nodal mets. No recurrence in LUL. This nodule was discussed at [facility] tumor board on 3/7 and recommended follow-up CT in 3 months. He and his family have been anxious. He is worried this will delay his cardiac management which has been causing him fatigue and palpitations. Frustrated that all his doctors in the same place and not communicating together.  He continues to have productive cough with green  sputum. Associated with wheezing and shortness of breath. Worsening by his palpitations. Denies fevers, chills.   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 Quit 10 years. 62 pack-years.   Past Medical History:  Diagnosis Date   ALLERGIC RHINITIS 10/08/2007   Qualifier: Diagnosis of  By: JJenny ReichmannMD, JSt. ElmoPROSTATIC HYPERTROPHY 09/28/2010   Qualifier: Diagnosis of  By: JJenny ReichmannMD, JHunt Oris   Bipolar affective disorder ([place] 09/30/2012   COLONIC POLYPS, HX OF 10/08/2007   Qualifier: Diagnosis of  By: JJenny ReichmannMD, JHunt Oris   Complication of anesthesia    sleeps awhile afterwards per patient   CONSTIPATION, CHRONIC, HX OF 08/10/2007   Qualifier: Diagnosis of  By: [place], [place] TYPE II 10/08/2007   Qualifier: Diagnosis of  By: JJenny ReichmannMD, JHunt Oris   ERECTILE DYSFUNCTION 10/08/2007   Qualifier: Diagnosis of  By: JJenny ReichmannMD, JHunt Oris   HYPERLIPIDEMIA 10/08/2007   Qualifier: Diagnosis of  By: JJenny ReichmannMD, JHunt Oris   HYPERTENSION 03/20/2009   Qualifier: Diagnosis of  By: JJenny ReichmannMD, JHunt Oris   PTSD (post-traumatic stress disorder) 09/30/2012   SPINAL STENOSIS, LUMBAR 10/08/2007   Per Dr JArnoldo Morale- [facility]   Qualifier: Diagnosis of  By: JJenny ReichmannMD, JHunt Oris      Family History  Problem Relation Age of Onset   Cancer Mother    Ulcers Mother        Stomach   Depression Mother  Anxiety disorder Mother    Allergies Sister      Social History   Occupational History   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, 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  Tobacco Use   Smoking status: Former    Packs/day: 1.50    Years: 41.00    Total pack years: 61.50    Types: Cigarettes    Quit date: 2013    Years since quitting: 11.1   Smokeless tobacco: Never  Vaping Use   Vaping Use: Never used  Substance and Sexual Activity   Alcohol use: Yes   Drug use: No   Sexual activity: Not on file     Allergies  Allergen Reactions   Chlorzoxazone Swelling     Outpatient Medications Prior to Visit  Medication Sig Dispense Refill   albuterol (VENTOLIN HFA) 108 (90 Base) MCG/ACT inhaler Inhale 2 puffs into the lungs every 6 (six) hours as needed for wheezing or shortness of breath. 18 g 5   amLODipine (NORVASC) 10 MG tablet amlodipine 10 mg tablet     aspirin 81 MG chewable tablet Chew 1 tablet (81 mg total) by mouth daily. 90 tablet 3   Cholecalciferol (VITAMIN D) 2000 UNITS CAPS Take 1 capsule by mouth 2 (two) times daily.      dextromethorphan-guaiFENesin (TUSSIN DM) 10-100 MG/5ML liquid Take 5 mLs by mouth every 4 (four) hours as needed for cough. 236 mL 1   fluticasone-salmeterol (WIXELA INHUB) 250-50 MCG/ACT AEPB ONE puff in the in morning and ONE puff in the evening 180 each 1   gabapentin (NEURONTIN) 300 MG capsule      guaiFENesin (MUCINEX) 600 MG 12 hr tablet Take 1 tablet (600 mg total) by mouth 2 (two) times daily as needed for to loosen phlegm or cough. 180 tablet 6   insulin glargine (LANTUS) 100 UNIT/ML injection 20 Units.     JARDIANCE 10 MG TABS tablet Take 10 mg by mouth daily.     metFORMIN (GLUCOPHAGE) 1000 MG tablet Take 1,000 mg by mouth 2 (two) times daily.     promethazine-dextromethorphan (PROMETHAZINE-DM) 6.25-15 MG/5ML syrup Take 5 mLs by mouth 4 (four) times daily as needed for cough. 118 mL 0   psyllium (METAMUCIL SMOOTH TEXTURE) 58.6 % powder Take 1 packet by mouth daily. 90 packet 1   Pyridoxine HCl (VITAMIN B6 PO) Take 1 tablet by mouth daily.      Ranolazine ER 500 MG PACK Take by mouth.     Semaglutide,0.25 or 0.'5MG'$ /DOS, (OZEMPIC, 0.25 OR 0.5 MG/DOSE,) 2 MG/1.5ML SOPN Inject 0.5 mg into the skin once a week.     sitaGLIPtin (JANUVIA) 100 MG tablet Take 100 mg by mouth daily.     Testosterone 20.25 MG/ACT (1.62%) GEL testosterone 20.25 mg/1.25 gram per pump act.(1.62 %) transdermal gel     Tiotropium Bromide Monohydrate (SPIRIVA RESPIMAT) 2.5 MCG/ACT  AERS Inhale 2 puffs into the lungs daily. 4 g 5   amoxicillin-clavulanate (AUGMENTIN) 875-125 MG tablet Take 1 tablet by mouth 2 (two) times daily. 14 tablet 0   clonazePAM (KLONOPIN) 0.5 MG tablet Take 0.5 mg by mouth 2 (two) times daily.     famotidine (PEPCID) 20 MG tablet Take 1 tablet (20 mg total) by mouth at bedtime. 90 tablet 2   metoprolol tartrate (LOPRESSOR) 25 MG tablet Take 25 mg by mouth daily. Take 1/2 tablet at bedtime. (Patient not taking: Reported on 01/24/2023)     QUEtiapine (SEROQUEL) 300 MG  tablet Take 300 mg by mouth at bedtime. (Patient not taking: Reported on 01/24/2023)     No facility-administered medications prior to visit.    ROS   Objective:   Vitals:   01/24/23 1045  BP: 138/62  Pulse: 88  SpO2: 96%  Weight: 241 lb 9.6 oz (109.6 kg)  Height: '5\' 8"'$  (1.727 m)   SpO2: 96 % O2 Device: None (Room air)  Physical Exam: General: Well-appearing, no acute distress HENT: [place], AT Eyes: EOMI, no scleral icterus Respiratory: Diminished breath sounds bilaterally.  No crackles, wheezing or rales Cardiovascular: RRR, -M/R/G, no JVD Extremities:-Edema,-tenderness Neuro: AAO x4, CNII-XII grossly intact Psych: Normal mood, normal affect  Data Reviewed:  Imaging: PET/CT 01/01/12 2.2 cm LUL hypermetabolic nodule in LUL. Paraseptal and centrilobular emphysema. CXR 11/04/22 - Bibasilar atelectasis PET/CT 01/22/23  (Report only) IMPRESSION: 1. Hypermetabolic RIGHT lower lobe pulmonary nodule concerning for  bronchogenic carcinoma.. Recommend tissue sampling versus short-term  CT follow-up.  2. No evidence of mediastinal nodal metastasis or distant metastatic  disease.  3. No evidence of lung cancer recurrence in the LEFT upper lobe.  4. Aortic atherosclerosis.   PFT: 04/29/22 FVC 2.49 (70%) FEV1 1.59 (59%) Ratio 64  TLC 70% DLCO 66% Interpretation: Moderately severe obstructive and restrictive defect with mildly reduced DLCO.  Labs: CBC    Component Value  Date/Time   WBC 7.6 11/04/2022 1709   RBC 6.72 (H) 11/04/2022 1709   HGB 16.7 11/04/2022 1709   HCT 54.5 (H) 11/04/2022 1709   PLT 222 11/04/2022 1709   MCV 81.1 11/04/2022 1709   MCH 24.9 (L) 11/04/2022 1709   MCHC 30.6 11/04/2022 1709   RDW 19.0 (H) 11/04/2022 1709   LYMPHSABS 2.6 11/04/2022 1709   MONOABS 0.8 11/04/2022 1709   EOSABS 0.1 11/04/2022 1709   BASOSABS 0.0 11/04/2022 1709   Abs eos 05/26/21 - 0     Assessment & Plan:   Discussion: 72 year old male with hx lung cancer s/p LUL lobectomy in 2013, COPD with emphysema, OSA not on CPAP, DM2, HTN, reflux who presents for follow-up. Persistent symptoms on Wixela and atrovent. Previously complained of throat irritation related to Spiriva but willing to try again. Discussed clinical course and management of COPD including bronchodilator regimen and action plan for exacerbation.  72 year old male with hx lung cancer s/p LUL lobectomy in 2013, COPD with emphysema, OSA not on CPAP, DM2, HTN reflux who presents for follow-up. Able to tolerate Wixela now, previously had throat burning.  New right lower lobe nodule 10 mm Hx lung cancer s/p LUL lobectomy 2013 --Provided reassurance with recommendation from multidisciplinary team on tumor board. Plan for CT Chest without contrast at [facility]  COPD exacerbation COPD with emphysema Moderate restrictive and obstructive defect in setting of lobectomy and emphysema --Prednisone taper --CONTINUE Spiriva TWO puffs in the morning --CONTINUE Wixela 250-50 ONE puff in the morning and ONE puff in the evening --CONTINUE Albuterol AS NEEDED for shortness of breath or wheezing --CONTINUE mucinex twice a day  --Not a candidate for chronic macrolide due to arrhythmia and on other QTC prolongation drugs  [facility]aged at [facility] Maintenance Immunization History  Administered Date(s) Administered   Dtap, Unspecified 08/17/1958, 09/15/1958, 11/13/1960   Fluad Quad(high Dose 65+) 07/31/2020,  09/21/2021   Influenza Split 09/19/2014   Influenza Whole 10/12/2007, 09/28/2010   Influenza, High Dose Seasonal PF 09/03/2017, 08/13/2019   Influenza, Seasonal, Injecte, Preservative Fre 08/12/2016   Influenza-Unspecified 08/06/2011, 10/18/2012, 08/26/2013, 09/13/2015  Moderna Covid-19 Vaccine Bivalent Booster 64yr & up 09/21/2021   Moderna Sars-Covid-2 Vaccination 12/22/2019, 01/19/2020, 11/02/2020   Pneumococcal Conjugate-13 11/21/2016   Pneumococcal Polysaccharide-23 12/06/2008, 01/19/2018   Pneumococcal-Unspecified 11/29/2011   Polio, Unspecified 07/11/1958, 08/17/1958, 11/13/1960   Smallpox 07/16/1958   Td 12/06/2008   Tdap 11/29/2011, 10/16/2021   Zoster Recombinat (Shingrix) 02/24/2019, 05/27/2019   Zoster, Live 11/03/2012   CT Lung Screen - hx lung cancer. [facility] No orders of the defined types were placed in this encounter.  No orders of the defined types were placed in this encounter.   No follow-ups on file.  I have spent a total time of***-minutes on the day of the appointment including chart review, data review, collecting history, coordinating care and discussing medical diagnosis and plan with the patient/family. Past medical history, allergies, medications were reviewed. Pertinent imaging, labs and tests included in this note have been reviewed and interpreted independently by me.  [place] MD LAlamoPulmonary [facility] Care 01/24/2023 11:00 AM  Office Number [phone]

## 2023-01-26 DIAGNOSIS — J441 Chronic obstructive pulmonary disease with (acute) exacerbation: Secondary | ICD-10-CM | POA: Insufficient documentation

## 2023-01-26 DIAGNOSIS — R911 Solitary pulmonary nodule: Secondary | ICD-10-CM | POA: Insufficient documentation

## 2023-02-07 ENCOUNTER — Encounter: Payer: Self-pay | Admitting: *Deleted

## 2023-02-07 ENCOUNTER — Ambulatory Visit: Payer: No Typology Code available for payment source | Attending: Cardiology | Admitting: Cardiology

## 2023-02-07 ENCOUNTER — Encounter: Payer: Self-pay | Admitting: Cardiology

## 2023-02-07 VITALS — BP 136/64 | HR 74 | Ht 68.0 in | Wt 246.0 lb

## 2023-02-07 DIAGNOSIS — I493 Ventricular premature depolarization: Secondary | ICD-10-CM

## 2023-02-07 DIAGNOSIS — I2089 Other forms of angina pectoris: Secondary | ICD-10-CM

## 2023-02-07 NOTE — Progress Notes (Signed)
Electrophysiology Office Note   Date:  02/07/2023   ID:  Jose, Patrick Sep 09, 1951, MRN [id number]  PCP:  [facility]  Cardiologist:  [place] Primary Electrophysiologist:  Linde Wilensky Meredith Leeds, MD    Chief Complaint: PVC   History of Present Illness: Jose Patrick is a 72 y.o. male who is being seen today for the evaluation of PVC at the request of [name], MD. Presenting today for electrophysiology evaluation.  He has a history significant bipolar disorder, PTSD, type 2 diabetes, hyperlipidemia, symptomatic PVCs.  He was seen by [facility] cardiology December 2023.  He had a PVC ablation in the RVOT, but PVCs have since continued.  He is currently on metoprolol and diltiazem.  He had an echo with a normal ejection fraction.  Since his ablation he is continue to feel that he has having palpitations.  Fortunately he wore a cardiac monitor for 3 days that showed a less than 1% PVC burden.  EKG today also shows no PVCs.  Today he complains of some chest discomfort that occurs when he exerts himself and improves when he rests.  The discomfort is over the left lateral side of his chest and does not radiate.  He does have potentially some shortness of breath that is associated with this.  Today, he denies symptoms of palpitations,  shortness of breath, orthopnea, PND, lower extremity edema, claudication, dizziness, presyncope, syncope, bleeding, or neurologic sequela. The patient is tolerating medications without difficulties.    Past Medical History:  Diagnosis Date   ALLERGIC RHINITIS 10/08/2007   Qualifier: Diagnosis of  By: Jenny Reichmann MD, [place] PROSTATIC HYPERTROPHY 09/28/2010   Qualifier: Diagnosis of  By: Jenny Reichmann MD, Hunt Oris    Bipolar affective disorder ([place]) 09/30/2012   COLONIC POLYPS, HX OF 10/08/2007   Qualifier: Diagnosis of  By: Jenny Reichmann MD, Hunt Oris    Complication of anesthesia    sleeps awhile afterwards per patient   CONSTIPATION,  CHRONIC, HX OF 08/10/2007   Qualifier: Diagnosis of  By: Sherwood, [place], TYPE II 10/08/2007   Qualifier: Diagnosis of  By: Jenny Reichmann MD, Hunt Oris    ERECTILE DYSFUNCTION 10/08/2007   Qualifier: Diagnosis of  By: Jenny Reichmann MD, Hunt Oris    HYPERLIPIDEMIA 10/08/2007   Qualifier: Diagnosis of  By: Jenny Reichmann MD, Hunt Oris    HYPERTENSION 03/20/2009   Qualifier: Diagnosis of  By: Jenny Reichmann MD, Hunt Oris    PTSD (post-traumatic stress disorder) 09/30/2012   SPINAL STENOSIS, LUMBAR 10/08/2007   Per Dr Arnoldo Morale - [facility]   Qualifier: Diagnosis of  By: Jenny Reichmann MD, Hunt Oris     Past Surgical History:  Procedure Laterality Date   COLONOSCOPY WITH PROPOFOL N/A 04/09/2021   Procedure: COLONOSCOPY WITH PROPOFOL;  Surgeon: Virgel Manifold, MD;  Location: [facility] ENDOSCOPY;  Service: Endoscopy;  Laterality: N/A;   cyst off right buttock     ESOPHAGOGASTRODUODENOSCOPY (EGD) WITH PROPOFOL N/A 04/09/2021   Procedure: ESOPHAGOGASTRODUODENOSCOPY (EGD) WITH PROPOFOL;  Surgeon: Virgel Manifold, MD;  Location: [facility] ENDOSCOPY;  Service: Endoscopy;  Laterality: N/A;   PREPATELLAR BURSA EXCISION     removal right   s/p bilat hydrocelectomy  2008   x 2   TONSILLECTOMY       Current Outpatient Medications  Medication Sig Dispense Refill   albuterol (VENTOLIN HFA) 108 (90 Base) MCG/ACT inhaler Inhale 2 puffs into the lungs every 6 (six) hours as needed for wheezing  or shortness of breath. 18 g 5   amLODipine (NORVASC) 10 MG tablet amlodipine 10 mg tablet     aspirin 81 MG chewable tablet Chew 1 tablet (81 mg total) by mouth daily. 90 tablet 3   Cholecalciferol (VITAMIN D) 2000 UNITS CAPS Take 1 capsule by mouth 2 (two) times daily.      clonazePAM (KLONOPIN) 0.5 MG tablet Take 0.5 mg by mouth 2 (two) times daily.     dextromethorphan-guaiFENesin (TUSSIN DM) 10-100 MG/5ML liquid Take 5 mLs by mouth every 4 (four) hours as needed for cough. 236 mL 1   fluticasone-salmeterol (WIXELA INHUB)  250-50 MCG/ACT AEPB ONE puff in the in morning and ONE puff in the evening 180 each 1   gabapentin (NEURONTIN) 300 MG capsule      guaiFENesin (MUCINEX) 600 MG 12 hr tablet Take 1 tablet (600 mg total) by mouth 2 (two) times daily as needed for to loosen phlegm or cough. 180 tablet 6   insulin glargine (LANTUS) 100 UNIT/ML injection 20 Units.     JARDIANCE 10 MG TABS tablet Take 10 mg by mouth daily.     losartan (COZAAR) 25 MG tablet Take 25 mg by mouth daily. 1/2 tab daily     metFORMIN (GLUCOPHAGE) 1000 MG tablet Take 1,000 mg by mouth 2 (two) times daily.     metoprolol tartrate (LOPRESSOR) 25 MG tablet Take 25 mg by mouth daily. Take 1/2 tablet at bedtime.     promethazine-dextromethorphan (PROMETHAZINE-DM) 6.25-15 MG/5ML syrup Take 5 mLs by mouth 4 (four) times daily as needed for cough. 118 mL 0   psyllium (METAMUCIL SMOOTH TEXTURE) 58.6 % powder Take 1 packet by mouth daily. 90 packet 1   Pyridoxine HCl (VITAMIN B6 PO) Take 1 tablet by mouth daily.      QUEtiapine (SEROQUEL) 300 MG tablet Take 300 mg by mouth at bedtime.     Ranolazine ER 500 MG PACK Take by mouth.     Semaglutide,0.25 or 0.5MG /DOS, (OZEMPIC, 0.25 OR 0.5 MG/DOSE,) 2 MG/1.5ML SOPN Inject 0.5 mg into the skin once a week.     sitaGLIPtin (JANUVIA) 100 MG tablet Take 100 mg by mouth daily.     Testosterone 20.25 MG/ACT (1.62%) GEL testosterone 20.25 mg/1.25 gram per pump act.(1.62 %) transdermal gel     Tiotropium Bromide Monohydrate (SPIRIVA RESPIMAT) 2.5 MCG/ACT AERS Inhale 2 puffs into the lungs daily. 4 g 5   famotidine (PEPCID) 20 MG tablet Take 1 tablet (20 mg total) by mouth at bedtime. 90 tablet 2   No current facility-administered medications for this visit.    Allergies:   Chlorzoxazone   Social History:  The patient  reports that he quit smoking about 11 years ago. His smoking use included cigarettes. He has a 61.50 pack-year smoking history. He has never used smokeless tobacco. He reports current alcohol use.  He reports that he does not use drugs.   Family History:  The patient's family history includes Allergies in his sister; Anxiety disorder in his mother; Cancer in his mother; Depression in his mother; Ulcers in his mother.    ROS:  Please see the history of present illness.   Otherwise, review of systems is positive for none.   All other systems are reviewed and negative.    PHYSICAL EXAM: VS:  BP 136/64   Pulse 74   Ht 5\' 8"  (1.727 m)   Wt 246 lb (111.6 kg)   SpO2 96%   BMI 37.40 kg/m  , BMI  Body mass index is 37.4 kg/m. GEN: Well nourished, well developed, in no acute distress  HEENT: normal  Neck: no JVD, carotid bruits, or masses Cardiac: RRR; no murmurs, rubs, or gallops,no edema  Respiratory:  clear to auscultation bilaterally, normal work of breathing GI: soft, nontender, nondistended, + BS MS: no deformity or atrophy  Skin: warm and dry Neuro:  Strength and sensation are intact Psych: euthymic mood, full affect  EKG:  EKG is ordered today. Personal review of the ekg ordered shows sinus rhythm  Recent Labs: 11/04/2022: BUN 23; Hemoglobin 16.7; Magnesium 2.1; Platelets 222; Potassium 4.9; Sodium 141 11/05/2022: Creatinine, Ser 1.35    Lipid Panel     Component Value Date/Time   CHOL 142 09/30/2012 1031   TRIG 83.0 09/30/2012 1031   HDL 40.90 09/30/2012 1031   CHOLHDL 3 09/30/2012 1031   VLDL 16.6 09/30/2012 1031   LDLCALC 85 09/30/2012 1031   LDLDIRECT 147.8 03/20/2009 1521     Wt Readings from Last 3 Encounters:  02/07/23 246 lb (111.6 kg)  01/24/23 241 lb 9.6 oz (109.6 kg)  01/02/23 240 lb (108.9 kg)      Other studies Reviewed: Additional studies/ records that were reviewed today include: Cardiac monitor 01/24/2023 personally reviewed Review of the above records today demonstrates:    Patch weart time was 2 days and 22 hours   Predominant rhythm was NSR with average HR 78bpm   There were 4 runs of SVT with longest lasting 5 beats   Rare SVE (<1%),  rare VE (<1%)   No sustained arrhythmias or significant pauses   Patient triggered events correlated with NSR with a PAC  TTE 08/01/22 Left Ventricle: Left ventricle size is normal.    Left Ventricle: Systolic function is normal. EF: 55-60%.    Left Ventricle: Doppler parameters consistent with mild diastolic  dysfunction and low to normal LA pressure.    Right Ventricle: Right ventricle size is normal.    Right Ventricle: Systolic function is normal.    Tricuspid Valve: The right ventricular systolic pressure is normal (<36  mmHg).   No significant valvular abnormalities.   ASSESSMENT AND PLAN:  1.  PVC: Status post ablation at [facility] December 2023.  Ablation was performed along the RVOT free wall.  Unfortunately, he continued to have PVCs post ablation.  More recent cardiac monitor with a PVC burden of less than 1%.  Currently on diltiazem and metoprolol.  No PVCs on his EKG today or his recent monitor.  I Aaliyah Cancro have him follow-up with his prior electrophysiologist as he has not seen him post procedure.  If he wishes to change electrophysiologist, I be happy to see him in the future.  2.  Hypertension: Currently well-controlled  3.  Severe COPD: Managed per PCP.  4.  Type 2 diabetes: Follows with endocrinology.  5.  Chest pain: Chest pain does have typical angina symptoms.  With his history of severe COPD and tobacco abuse, Kenyona Rena plan for coronary CTA.  Case discussed with primary cardiology  Current medicines are reviewed at length with the patient today.   The patient does not have concerns regarding his medicines.  The following changes were made today:  none  Labs/ tests ordered today include:  Orders Placed This Encounter  Procedures   CT CORONARY MORPH W/CTA COR W/SCORE W/CA W/CM &/OR WO/CM   EKG 12-Lead     Disposition:   FU with Locklan Canoy  PRN  Signed, Norleen Xie Meredith Leeds, MD  02/07/2023 11:49  AM     [facility] [address] [place] [address] [phone] (office) [phone] (fax)

## 2023-02-07 NOTE — Patient Instructions (Signed)
Medication Instructions:  Your physician recommends that you continue on your current medications as directed. Please refer to the Current Medication list given to you today.  *If you need a refill on your cardiac medications before your next appointment, please call your pharmacy*   Lab Work: None ordered If you have labs (blood work) drawn today and your tests are completely normal, you will receive your results only by: [place] (if you have MyChart) OR A paper copy in the mail If you have any lab test that is abnormal or we need to change your treatment, we will call you to review the results.   Testing/Procedures: Your physician has requested that you have cardiac CT. Cardiac computed tomography (CT) is a painless test that uses an x-ray machine to take clear, detailed pictures of your heart. For further information please visit [email or web address].tn. Please follow instruction sheet as given.   Follow-Up: At [facility], you and your health needs are our priority.  As part of our continuing mission to provide you with exceptional heart care, we have created designated Provider Care Teams.  These Care Teams include your primary Cardiologist (physician) and Advanced Practice Providers (APPs -  Physician Assistants and Nurse Practitioners) who all work together to provide you with the care you need, when you need it.  Your next appointment:   as  needed  The format for your next appointment:   In Person  Provider:   Allegra Lai, MD    Thank you for choosing [facility]!!   Trinidad Curet, RN [phone]  Other Instructions

## 2023-02-10 ENCOUNTER — Telehealth (HOSPITAL_COMMUNITY): Payer: Self-pay | Admitting: *Deleted

## 2023-02-10 NOTE — Telephone Encounter (Signed)
Reaching out to patient to offer assistance regarding upcoming cardiac imaging study; pt verbalizes understanding of appt date/time, parking situation and where to check in, pre-test NPO status and medications ordered, and verified current allergies; name and call back number provided for further questions should they arise  Jose Clement RN Navigator Cardiac Imaging [facility] [phone] office [phone] cell  Patient to take 50mg  metoprolol tartrate two hours prior to his cardiac CT scan.  He is aware to arrive at 3pm.

## 2023-02-11 ENCOUNTER — Other Ambulatory Visit (HOSPITAL_COMMUNITY): Payer: Self-pay | Admitting: *Deleted

## 2023-02-11 ENCOUNTER — Other Ambulatory Visit: Payer: Self-pay | Admitting: *Deleted

## 2023-02-11 ENCOUNTER — Other Ambulatory Visit: Payer: Self-pay

## 2023-02-11 MED ORDER — METOPROLOL TARTRATE 25 MG PO TABS: 25.0000 mg | ORAL_TABLET | Freq: Two times a day (BID) | ORAL | 1 refills | Status: DC

## 2023-02-11 MED ORDER — RANOLAZINE ER 500 MG PO TB12: 500.0000 mg | ORAL_TABLET | Freq: Two times a day (BID) | ORAL | 3 refills | Status: DC

## 2023-02-11 MED ORDER — METOPROLOL TARTRATE 50 MG PO TABS: ORAL_TABLET | ORAL | 0 refills | Status: DC

## 2023-02-11 NOTE — Telephone Encounter (Signed)
Pt calling requesting a refill on Ranolazine 500 mg. Would Dr. Johney Frame like to refill this medication? Please address and reorder medication. Thanks

## 2023-02-11 NOTE — Telephone Encounter (Signed)
How is pt supposed to be taking this medication Ranolazine 500 mg? Please address

## 2023-02-11 NOTE — Telephone Encounter (Signed)
Pt's medication was sent to pt's pharmacy as requested. Confirmation received.  °

## 2023-02-11 NOTE — Telephone Encounter (Signed)
Called the pt to inquire exactly his current dose of Ranexa he is taking.  Pt states his old Cardiologist before Dr. Johney Frame had him taking Ranexa 500 mg po BID.  He has never done the Ranexa 500 mg ER PACK.  He states he is still taking his old script from previous Cardiologist at Ranexa 500 mg po BID.  He is needing a refill of this medication sent to [facility].    Will ask Dr. Johney Frame if I can sent this into his pharmacy of choice as Ranexa 500 mg po BID.

## 2023-02-11 NOTE — Progress Notes (Signed)
Expand All Collapse All  Cardiology Office Note:     Date:  01/02/2023    ID:  Jose Patrick, DOB January 04, 1951, MRN [id number]   PCP:  Administ[facility], [place] Prov[facility] Cardiologist:  None    Referring MD: [facility], [facility]      History of Present Illness:     Jose Patrick is a 72 y.o. male with a hx of bipolar disorder, PTSD, DMII, HLD, and symptomatic PVCs s/p ablation at [facility] who was referred by the [facility] for further evaluation of palpitations and SOB.    Patient was seen by Cardiology at [facility] on 10/2022. Note reviewed. He has had a PVC ablation but continued to have PVCs post-ablation. There was concern that his PVCs may be epicardial in nature. At that visit, his metop was changed to dilt 240mg  daily. Prior myoview 07/2022 with no ischemia. TTE 07/2022 with EF 55-60%, G1DD, normal RV, no significant valve disease. Prior cardiac monitor (preceded ablation) with 25.5% burden of PVCs.   Today, the patient states that he does feel well due to continued to PVCs. Symptoms have progressed since his ablation 10/21/22. Specifically he feels chest discomfort, palpitations and fatigue when the PVCs occur. No lightheadedness, syncope, or dizziness. Notably, he has chronic SOB with exertion and occasionally at rest which he attributes to severe COPD.        Past Medical History:  Diagnosis Date   ALLERGIC RHINITIS 10/08/2007    Qualifier: Diagnosis of  By: Jenny Reichmann MD, [place] PROSTATIC HYPERTROPHY 09/28/2010    Qualifier: Diagnosis of  By: Jenny Reichmann MD, Hunt Oris    Bipolar affective disorder ([place]) 09/30/2012   COLONIC POLYPS, HX OF 10/08/2007    Qualifier: Diagnosis of  By: Jenny Reichmann MD, Hunt Oris    Complication of anesthesia      sleeps awhile afterwards per patient   CONSTIPATION, CHRONIC, HX OF 08/10/2007    Qualifier: Diagnosis of  By: [place], [place], TYPE II 10/08/2007    Qualifier: Diagnosis of  By: Jenny Reichmann MD,  Hunt Oris    ERECTILE DYSFUNCTION 10/08/2007    Qualifier: Diagnosis of  By: Jenny Reichmann MD, Hunt Oris    HYPERLIPIDEMIA 10/08/2007    Qualifier: Diagnosis of  By: Jenny Reichmann MD, Hunt Oris    HYPERTENSION 03/20/2009    Qualifier: Diagnosis of  By: Jenny Reichmann MD, Hunt Oris    PTSD (post-traumatic stress disorder) 09/30/2012   SPINAL STENOSIS, LUMBAR 10/08/2007    Per Dr Arnoldo Morale - [facility]   Qualifier: Diagnosis of  By: Jenny Reichmann MD, Hunt Oris             Past Surgical History:  Procedure Laterality Date   COLONOSCOPY WITH PROPOFOL N/A 04/09/2021    Procedure: COLONOSCOPY WITH PROPOFOL;  Surgeon: Virgel Manifold, MD;  Location: [facility] ENDOSCOPY;  Service: Endoscopy;  Laterality: N/A;   cyst off right buttock       ESOPHAGOGASTRODUODENOSCOPY (EGD) WITH PROPOFOL N/A 04/09/2021    Procedure: ESOPHAGOGASTRODUODENOSCOPY (EGD) WITH PROPOFOL;  Surgeon: Virgel Manifold, MD;  Location: [facility] ENDOSCOPY;  Service: Endoscopy;  Laterality: N/A;   PREPATELLAR BURSA EXCISION        removal right   s/p bilat hydrocelectomy   2008    x 2   TONSILLECTOMY          Current Medications: Active Medications  Current Meds  Medication Sig   albuterol (VENTOLIN HFA) 108 (90 Base) MCG/ACT inhaler Inhale 2 puffs into the lungs every 6 (six) hours as needed for wheezing or shortness of breath.   amLODipine (NORVASC) 10 MG tablet amlodipine 10 mg tablet   amoxicillin-clavulanate (AUGMENTIN) 875-125 MG tablet Take 1 tablet by mouth 2 (two) times daily.   aspirin 81 MG chewable tablet Chew 1 tablet (81 mg total) by mouth daily.   Cholecalciferol (VITAMIN D) 2000 UNITS CAPS Take 1 capsule by mouth 2 (two) times daily.    clonazePAM (KLONOPIN) 0.5 MG tablet Take 0.5 mg by mouth 2 (two) times daily.   dextromethorphan-guaiFENesin (TUSSIN DM) 10-100 MG/5ML liquid Take 5 mLs by mouth every 4 (four) hours as needed for cough.   fluticasone-salmeterol (WIXELA INHUB) 250-50 MCG/ACT AEPB ONE puff in the in morning and ONE puff in  the evening   gabapentin (NEURONTIN) 300 MG capsule     guaiFENesin (MUCINEX) 600 MG 12 hr tablet Take 1 tablet (600 mg total) by mouth 2 (two) times daily as needed for to loosen phlegm or cough.   insulin glargine (LANTUS) 100 UNIT/ML injection 20 Units.   JARDIANCE 10 MG TABS tablet Take 10 mg by mouth daily.   metFORMIN (GLUCOPHAGE) 1000 MG tablet Take 1,000 mg by mouth 2 (two) times daily.   metoprolol tartrate (LOPRESSOR) 25 MG tablet Take 25 mg by mouth daily. Take 1/2 tablet at bedtime.   promethazine-dextromethorphan (PROMETHAZINE-DM) 6.25-15 MG/5ML syrup Take 5 mLs by mouth 4 (four) times daily as needed for cough.   psyllium (METAMUCIL SMOOTH TEXTURE) 58.6 % powder Take 1 packet by mouth daily.   Pyridoxine HCl (VITAMIN B6 PO) Take 1 tablet by mouth daily.    QUEtiapine (SEROQUEL) 300 MG tablet Take 300 mg by mouth at bedtime.   Semaglutide,0.25 or 0.5MG /DOS, (OZEMPIC, 0.25 OR 0.5 MG/DOSE,) 2 MG/1.5ML SOPN Inject 0.5 mg into the skin once a week.   sitaGLIPtin (JANUVIA) 100 MG tablet Take 100 mg by mouth daily.   Testosterone 20.25 MG/ACT (1.62%) GEL testosterone 20.25 mg/1.25 gram per pump act.(1.62 %) transdermal gel   Tiotropium Bromide Monohydrate (SPIRIVA RESPIMAT) 2.5 MCG/ACT AERS Inhale 2 puffs into the lungs daily.        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  Tobacco Use   Smoking status: Former      Packs/day: 1.50      Years: 41.00      Total pack years: 61.50      Types: Cigarettes      Quit date: 2013      Years since quitting: 11.1   Smokeless tobacco: Never  Vaping Use   Vaping Use: Never used  Substance and Sexual Activity    Alcohol use: Yes   Drug use: No   Sexual activity: Not on file  Other Topics Concern   Not on file  Social History Narrative   Not on file    Social Determinants of Health    Financial Resource Strain: Not on file  Food Insecurity: Not on file  Transportation Needs: Not on file  Physical Activity: Not on file  Stress: Not on file  Social Connections: Not on file      Family History: The patient's family history includes Allergies in his sister; Anxiety disorder in his mother; Cancer in his mother; Depression in his mother; Ulcers in his mother.   ROS:   Please see the history of present illness.     All other systems reviewed and are negative.   EKGs/Labs/Other Studies Reviewed:     The following studies were reviewed today: Myoview 07/2022: TID ratio = 0.76   Impression  1. There is no evidence of inducible ischemia by myocardial perfusion imaging.  2.  Mildly reduced left ventricular systolic function.  3.  No ECG changes noted with pharmacological stressor to suggest ischemia. PVCs noted throughout the study.    TTE 08/01/22: Left Ventricle: Left ventricle size is normal.    Left Ventricle: Systolic function is normal. EF: 55-60%.    Left Ventricle: Doppler parameters consistent with mild diastolic  dysfunction and low to normal LA pressure.    Right Ventricle: Right ventricle size is normal.    Right Ventricle: Systolic function is normal.    Tricuspid Valve: The right ventricular systolic pressure is normal (<36  mmHg).   No significant valvular abnormalities.    Cardiac Monitor 07/2022: Indication: Palpitations.   Underlying rhythm is Sinus.   The minimum heart rate was 53 bpm at 4:01 AM; the maximum 116 bpm; the average 79 bpm.   Patient remained bradycardic 0.5% of total time.  Patient remained tachycardic 1.3% of total time.   6 supraventricular episodes were found. Patient was asymptomatic.  Longest SVT Episode 17 beat run with HR 137 at 1:49 AM.   Fastest SVT 3 beat run with HR 152 bpm at 2:38 PM.   Very frequent PVCs noted. There were a total of 157,106 PVCs with 275 couplets. Overall PVC Burden at 25.5 %.  [id number] PVCs noted in bigeminal pattern (21.7%).  10,964 PVCs noted in trigeminal pattern (1.8%).   There were a total of only 1799 PSVCs with 0 couplets. Overall PSVC Burden at 0.3 %.   There is a total of 0 patient events.   The longest R-R interval is 1.1 seconds.   The average Qtc 410 msec. Min Qtc 366 msec, maximum Qtc 438 msec.  Qtc was > 460 msec 1 % of total time.      EKG:  EKG is no ordered today.     Recent Labs: 11/04/2022: BUN 23; Hemoglobin 16.7; Magnesium 2.1; Platelets 222; Potassium 4.9; Sodium 141 11/05/2022: Creatinine, Ser 1.35  Recent Lipid Panel Labs (Brief)          Component Value Date/Time    CHOL 142 09/30/2012 1031    TRIG 83.0 09/30/2012 1031    HDL 40.90 09/30/2012 1031    CHOLHDL 3 09/30/2012 1031    VLDL 16.6 09/30/2012 1031    LDLCALC 85 09/30/2012 1031    LDLDIRECT 147.8 03/20/2009 1521          Risk Assessment/Calculations:            as indicated below at last OV appt Dr. Johney Frame had with the pt, he is to continue current dose of metoprolol tartrate 25 mg BID.  This was also advised by Dr. Curt Bears EP MD at recent [place] with the pt.  Pt will be needing a refill of this medication.  Sent the refill of this medication to the pts confirmed  pharmacy of choice, [facility].   Pt aware and was gracious for all the assistance provided.        Physical Exam:     VS:  BP 120/74   Pulse 88   Wt 240 lb (108.9 kg)   SpO2 96%   BMI 36.49 kg/m         Wt Readings from Last 3 Encounters:  01/02/23 240 lb (108.9 kg)  11/20/22 241 lb 6.4 oz (109.5 kg)  11/04/22 234 lb 11.2 oz (106.5 kg)      GEN:  Well nourished, well developed in no acute distress HEENT: Normal NECK: No JVD; No carotid bruits CARDIAC: RRR with occasional PVCs during exam. No  murmurs RESPIRATORY: Diminished but clear ABDOMEN: Soft, non-tender, non-distended MUSCULOSKELETAL:  No edema; No deformity  SKIN: Warm and dry NEUROLOGIC:  Alert and oriented x 3 PSYCHIATRIC:  Normal affect    ASSESSMENT:     1. Frequent PVCs   2. Essential hypertension   3. Diabetes mellitus with coincident hypertension ([place])   4. Pulmonary emphysema, unspecified emphysema type ([place])     PLAN:     In order of problems listed above:   #Symptomatic PVCs s/p ablation: Cardiac monitor 07/2022 with 25% burden of PVCs. TTE with LVEF 55-60%, no significant valve disease and myoview with no reported ischemia. Underwent ablation at [facility] in 10/2022 but with persistent symptoms. There was discussion that he may need an epicardial ablation, however, this has yet to be performed and patient wishes to transfer care. Will repeat monitor to reassess PVC burden and refer to EP at this time. -Repeat zio monitor -Continue dilt 240mg  daily -Continue metoprolol 25mg  BID -Refer to EP for further evaluation

## 2023-02-13 ENCOUNTER — Ambulatory Visit (HOSPITAL_BASED_OUTPATIENT_CLINIC_OR_DEPARTMENT_OTHER)
Admission: RE | Admit: 2023-02-13 | Discharge: 2023-02-13 | Disposition: A | Discharge status: Home / Self Care | Payer: No Typology Code available for payment source | Source: Ambulatory Visit | Attending: Internal Medicine | Admitting: Internal Medicine

## 2023-02-13 ENCOUNTER — Ambulatory Visit (HOSPITAL_COMMUNITY)
Admission: RE | Admit: 2023-02-13 | Discharge: 2023-02-13 | Disposition: A | Discharge status: Home / Self Care | Payer: No Typology Code available for payment source | Source: Ambulatory Visit | Attending: Cardiology | Admitting: Cardiology

## 2023-02-13 VITALS — BP 134/59 | HR 70 | Resp 12

## 2023-02-13 DIAGNOSIS — R931 Abnormal findings on diagnostic imaging of heart and coronary circulation: Secondary | ICD-10-CM

## 2023-02-13 DIAGNOSIS — I2089 Other forms of angina pectoris: Secondary | ICD-10-CM | POA: Diagnosis present

## 2023-02-13 DIAGNOSIS — R0600 Dyspnea, unspecified: Secondary | ICD-10-CM | POA: Diagnosis present

## 2023-02-13 DIAGNOSIS — I25118 Atherosclerotic heart disease of native coronary artery with other forms of angina pectoris: Secondary | ICD-10-CM

## 2023-02-13 MED ORDER — IOHEXOL 350 MG/ML SOLN
100.0000 mL | Freq: Once | INTRAVENOUS | Status: AC | PRN
  Administered 2023-02-13: 100 mL via INTRAVENOUS

## 2023-02-13 MED ORDER — NITROGLYCERIN 0.4 MG SL SUBL
0.8000 mg | SUBLINGUAL_TABLET | Freq: Once | SUBLINGUAL | Status: AC
  Administered 2023-02-13: 0.8 mg via SUBLINGUAL

## 2023-02-25 ENCOUNTER — Other Ambulatory Visit: Payer: Self-pay

## 2023-02-25 DIAGNOSIS — I10 Essential (primary) hypertension: Secondary | ICD-10-CM

## 2023-02-26 ENCOUNTER — Ambulatory Visit: Payer: No Typology Code available for payment source | Attending: Cardiology

## 2023-02-26 DIAGNOSIS — I10 Essential (primary) hypertension: Secondary | ICD-10-CM

## 2023-02-27 LAB — LIPID PANEL
Chol/HDL Ratio: 2.7 ratio (ref 0.0–5.0)
Cholesterol, Total: 153 mg/dL (ref 100–199)
HDL: 56 mg/dL (ref >39)
LDL Chol Calc (NIH): 77 mg/dL (ref 0–99)
Triglycerides: 110 mg/dL (ref 0–149)
VLDL Cholesterol Cal: 20 mg/dL (ref 5–40)

## 2023-03-28 NOTE — Progress Notes (Unsigned)
Office Visit    Patient Name: Jose Patrick Date of Encounter: 03/31/2023  Primary Care Provider:  [facility] Primary Cardiologist:  None Primary Electrophysiologist: None   Past Medical History    Past Medical History:  Diagnosis Date   ALLERGIC RHINITIS 10/08/2007   Qualifier: Diagnosis of  By: Jonny Ruiz MD, Len Blalock    BENIGN PROSTATIC HYPERTROPHY 09/28/2010   Qualifier: Diagnosis of  By: Jonny Ruiz MD, Len Blalock    Bipolar affective disorder (HCC) 09/30/2012   COLONIC POLYPS, HX OF 10/08/2007   Qualifier: Diagnosis of  By: Jonny Ruiz MD, Len Blalock    Complication of anesthesia    sleeps awhile afterwards per patient   CONSTIPATION, CHRONIC, HX OF 08/10/2007   Qualifier: Diagnosis of  By: Maris Berger    DIABETES MELLITUS, TYPE II 10/08/2007   Qualifier: Diagnosis of  By: Jonny Ruiz MD, Len Blalock    ERECTILE DYSFUNCTION 10/08/2007   Qualifier: Diagnosis of  By: Jonny Ruiz MD, Len Blalock    HYPERLIPIDEMIA 10/08/2007   Qualifier: Diagnosis of  By: Jonny Ruiz MD, Len Blalock    HYPERTENSION 03/20/2009   Qualifier: Diagnosis of  By: Jonny Ruiz MD, Len Blalock    PTSD (post-traumatic stress disorder) 09/30/2012   SPINAL STENOSIS, LUMBAR 10/08/2007   Per Dr Lovell Sheehan - [facility]   Qualifier: Diagnosis of  By: Jonny Ruiz MD, Len Blalock     Past Surgical History:  Procedure Laterality Date   COLONOSCOPY WITH PROPOFOL N/A 04/09/2021   Procedure: COLONOSCOPY WITH PROPOFOL;  Surgeon: Pasty Spillers, MD;  Location: [facility] ENDOSCOPY;  Service: Endoscopy;  Laterality: N/A;   cyst off right buttock     ESOPHAGOGASTRODUODENOSCOPY (EGD) WITH PROPOFOL N/A 04/09/2021   Procedure: ESOPHAGOGASTRODUODENOSCOPY (EGD) WITH PROPOFOL;  Surgeon: Pasty Spillers, MD;  Location: [facility] ENDOSCOPY;  Service: Endoscopy;  Laterality: N/A;   PREPATELLAR BURSA EXCISION     removal right   s/p bilat hydrocelectomy  2008   x 2   TONSILLECTOMY      Allergies  Allergies  Allergen Reactions   Chlorzoxazone Swelling      History of Present Illness    Jose Patrick  is a 72 year old male with a 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  Jose Patrick was seen initially by Dr. Shari Prows on 01/02/2023.  He was previously followed at [facility] cardiology and was seen due to increased palpitations and shortness of breath.  He was last seen by [facility] cardiology on 10/2022 and at that time PVCs were thought to be possibly epicardial.  He was started on Cardizem and continued metoprolol.  During his visit patient reported chronic shortness of breath with exertion which he attributes to severe COPD.  Most recent 2D echo showed EF of 55 to 60% with no significant valvular abnormalities and Myoview was completed that showed no evidence of ischemia.  He was referred to Dr. Elberta Fortis and ZIO monitor was ordered which revealed no significant arrhythmias or pauses with minimal runs of SVT.  He was seen by Dr. Elberta Fortis on 02/07/2023 and reported some  chest discomfort when he exerts himself.  He underwent a coronary CTA that showed calcium score 416 and moderate stenosis and patient underwent FFR that showed no evidence of significant functional stenosis.  Jose Patrick presents today for 69-month follow-up alone.  Since last  being seen in the office patient reports that he has been experiencing less palpitations and chest discomfort since his previous visit.  He does note increased nausea and dizziness that occurs in the morning when he takes his medications.  This is also associated with increased fatigue and sleepiness.  He reports taking all of his medications at 1 time on an empty stomach.  I advised him to break up his medications and take some after a meal and some in the evenings.  In reviewing his medications he is currently been prescribed atenolol and metoprolol.  He  is aware of triggers for palpitations and what foods to avoid to prevent recurrence.  He is currently wearing an event monitor that his cardiologist at [facility] prescribed for him.  He is quantifying his PVCs post ablation.  Patient denies chest pain, palpitations, dyspnea, PND, orthopnea, nausea, vomiting, dizziness, syncope, edema, weight gain, or early satiety.   Home Medications    Current Outpatient Medications  Medication Sig Dispense Refill   albuterol (VENTOLIN HFA) 108 (90 Base) MCG/ACT inhaler Inhale 2 puffs into the lungs every 6 (six) hours as needed for wheezing or shortness of breath. 18 g 5   amLODipine (NORVASC) 10 MG tablet Take 10 mg by mouth every evening.     aspirin 81 MG chewable tablet Chew 1 tablet (81 mg total) by mouth daily. 90 tablet 3   Cholecalciferol (VITAMIN D) 2000 UNITS CAPS Take 1 capsule by mouth 2 (two) times daily.      clonazePAM (KLONOPIN) 0.5 MG tablet Take 0.5 mg by mouth 2 (two) times daily.     fluticasone-salmeterol (WIXELA INHUB) 250-50 MCG/ACT AEPB ONE puff in the in morning and ONE puff in the evening 180 each 1   gabapentin (NEURONTIN) 300 MG capsule Take 300 mg by mouth 2 (two) times daily.     guaiFENesin (MUCINEX) 600 MG 12 hr tablet Take 1 tablet (600 mg total) by mouth 2 (two) times daily as needed for to loosen phlegm or cough. 180 tablet 6   insulin glargine (LANTUS) 100 UNIT/ML injection 20 Units.     JARDIANCE 10 MG TABS tablet Take 10 mg by mouth daily.     losartan (COZAAR) 25 MG tablet Take 25 mg by mouth daily. 1/2 tab daily     metFORMIN (GLUCOPHAGE) 1000 MG tablet Take 1,000 mg by mouth 2 (two) times daily.     metoprolol tartrate (LOPRESSOR) 25 MG tablet Take 1 tablet (25 mg total) by mouth 2 (two) times daily. 180 tablet 1   metoprolol tartrate (LOPRESSOR) 50 MG tablet Take tablet TWO hours prior to your cardiac CT scan. 1 tablet 0   promethazine-dextromethorphan (PROMETHAZINE-DM) 6.25-15 MG/5ML syrup Take 5 mLs by mouth 4 (four)  times daily as needed for cough. 118 mL 0   psyllium (METAMUCIL SMOOTH TEXTURE) 58.6 % powder Take 1 packet by mouth daily. 90 packet 1   Pyridoxine HCl (VITAMIN B6 PO) Take 1 tablet by mouth daily.      ranolazine (RANEXA) 500 MG 12 hr tablet Take 1 tablet (500 mg total) by mouth 2 (two) times daily. 180 tablet 3   Semaglutide,0.25 or 0.5MG /DOS, (OZEMPIC, 0.25 OR 0.5 MG/DOSE,) 2 MG/1.5ML SOPN Inject 0.5 mg into the skin once a week.     sitaGLIPtin (JANUVIA) 100 MG tablet Take 100 mg by mouth daily.     Testosterone 20.25 MG/ACT (1.62%) GEL testosterone 20.25 mg/1.25 gram per pump act.(1.62 %) transdermal gel     Tiotropium Bromide  Monohydrate (SPIRIVA RESPIMAT) 2.5 MCG/ACT AERS Inhale 2 puffs into the lungs daily. 4 g 5   famotidine (PEPCID) 20 MG tablet Take 1 tablet (20 mg total) by mouth at bedtime. 90 tablet 2   No current facility-administered medications for this visit.     Review of Systems  Please see the history of present illness.    (+) Fatigue (+) Palpitations  All other systems reviewed and are otherwise negative except as noted above.  Physical Exam    Wt Readings from Last 3 Encounters:  03/31/23 250 lb 3.2 oz (113.5 kg)  02/07/23 246 lb (111.6 kg)  01/24/23 241 lb 9.6 oz (109.6 kg)   VS: Vitals:   03/31/23 1026  BP: (!) 142/70  Pulse: 67  SpO2: 95%  ,Body mass index is 38.04 kg/m.  Constitutional:      Appearance: Obese not in distress.  Neck:     Vascular: JVD normal.  Pulmonary:     Effort: Pulmonary effort is normal.     Breath sounds: No wheezing. No rales. Diminished in the bases Cardiovascular:     Normal rate. Regular rhythm. Normal S1. Normal S2.      Murmurs: There is no murmur.  Edema:    Trace lower extremity edema Abdominal:     Palpations: Abdomen is soft non tender. There is no hepatomegaly.  Skin:    General: Skin is warm and dry.  Neurological:     General: No focal deficit present.     Mental Status: Alert and oriented to  person, place and time.     Cranial Nerves: Cranial nerves are intact.  EKG/LABS/ Recent Cardiac Studies    ECG personally reviewed by me today -none completed today  Cardiac Studies & Procedures         MONITORS  LONG TERM MONITOR (3-14 DAYS) 01/24/2023  Narrative   Patch weart time was 2 days and 22 hours   Predominant rhythm was NSR with average HR 78bpm   There were 4 runs of SVT with longest lasting 5 beats   Rare SVE (<1%), rare VE (<1%)   No sustained arrhythmias or significant pauses   Patient triggered events correlated with NSR with a PAC   Patch Wear Time:  2 days and 22 hours (2024-02-20T11:35:58-0500 to 2024-02-23T09:37:57-0500)  Patient had a min HR of 55 bpm, max HR of 141 bpm, and avg HR of 78 bpm. Predominant underlying rhythm was Sinus Rhythm. 4 Supraventricular Tachycardia runs occurred, the run with the fastest interval lasting 4 beats with a max rate of 141 bpm, the longest lasting 5 beats with an avg rate of 116 bpm. Isolated SVEs were rare (<1.0%), SVE Couplets were rare (<1.0%), and SVE Triplets were rare (<1.0%). Isolated VEs were rare (<1.0%, 516), VE Couplets were rare (<1.0%, 3), and VE Triplets were rare (<1.0%, 2). Ventricular Bigeminy was present.  Laurance Flatten, MD   CT SCANS  CT CORONARY MORPH W/CTA COR W/SCORE 02/15/2023  Addendum 02/15/2023  9:57 PM ADDENDUM REPORT: 02/15/2023 21:54  EXAM: OVER-READ INTERPRETATION  CT CHEST  The following report is an over-read performed by radiologist Dr. Aram Candela of [facility] on 02/15/2023. This over-read does not include interpretation of cardiac or coronary anatomy or pathology. The coronary calcium score/coronary CTA interpretation by the cardiologist is attached.  COMPARISON:  None.  FINDINGS: Cardiovascular: There are no significant extracardiac vascular findings.  Mediastinum/Nodes: There are no enlarged lymph nodes within the visualized mediastinum.  Lungs/Pleura:  There is no  pleural effusion. 3 mm and 4 mm noncalcified lung nodules are seen within the inferior aspect of the right upper lobe (axial CT image 9, CT series 13).  Upper abdomen: No significant findings in the visualized upper abdomen.  Musculoskeletal/Chest wall: No chest wall mass or suspicious osseous findings within the visualized chest.  IMPRESSION: 3 mm and 4 mm noncalcified right upper lobe lung nodules. No follow-up needed if patient is low-risk (and has no known or suspected primary neoplasm). Non-contrast chest CT can be considered in 12 months if patient is high-risk. This recommendation follows the consensus statement: Guidelines for Management of Incidental Pulmonary Nodules Detected on CT Images: From the Fleischner Society 2017; Radiology 2017; 284:228-243.   Electronically Signed By: Aram Candela M.D. On: 02/15/2023 21:54  Narrative CLINICAL DATA:  72 Year-old Male  EXAM: Cardiac/Coronary  CTA  TECHNIQUE: The patient was scanned on a [employer].  FINDINGS: Scan was triggered in the descending thoracic aorta. Axial non-contrast 3 mm slices were carried out through the heart. The data set was analyzed on a dedicated work station and scored using the Agatson method. Gantry rotation speed was 250 msecs and collimation was .6 mm. 0.8 mg of sl NTG was given. The 3D data set was reconstructed in 5% intervals of the 67-82 % of the R-R cycle. Diastolic phases were analyzed on a dedicated work station using MPR, MIP and VRT modes. The patient received 100 cc of contrast.  Coronary Arteries:  Normal coronary origin.  Right dominance.  Coronary Calcium Score:  Left main: 43  Left anterior descending artery: 145  Left circumflex artery: 227  Right coronary artery: 1  Ramus intermedius artery: 0  Total: 416  Percentile: 84th for age, sex, and race matched control.  RCA is a large dominant artery that gives rise to PDA and PLA, SA node  vessel. There is no significant plaque.  Left main is a large artery that gives rise to LAD, RI, and LCX arteries. Minimal non-obstructive calcified plaque (1-24%) in the distal vessel.  LAD is a large vessel that gives rise to one small diagonal vessel. Minimal non-obstructive calcified plaque (1-24%) in the mid LAD.  LCX is a non-dominant artery that gives rise to one OM1 branch. Minimal non-obstructive calcified plaque (1-24%) in the mid LCX.  There is a ramus intermedius vessel with moderate calcified stenosis (~50%) in the ostium of the vessel.  Other findings:  Aorta: Normal size.   Aortic atherosclerosis.  No dissection.  Main Pulmonary Artery: Normal size of the pulmonary artery.  Systemic Veins: Normal drainage  Aortic Valve:  Tri-leaflet.  AV calcium score 2.  Mitral valve: No calcifications.  Normal variant pulmonary vein drainage into the left atrium; left common pulmonary vein.  Normal left atrial appendage without a thrombus.  Interatrial septum with no clear communications.  Left Ventricle: Normal size  Left Atrium: Normal size  Right Ventricle: Normal size  Right Atrium: Normal size  Pericardium: Normal thickness  Extra-cardiac findings: See attached radiology report for non-cardiac structures.  IMPRESSION: 1. Coronary calcium score of 416. This was 84th percentile for age, sex, and race matched control.  2. Normal coronary origin with Right dominance.  3. CAD-RADS 3. Moderate stenosis. Consider symptom-guided anti-ischemic pharmacotherapy as well as risk factor modification per guideline directed care. Additional analysis with CT FFR will be submitted.  RECOMMENDATIONS: RECOMMENDATIONS The proposed cut-off value of 1,651 AU yielded a 93 % sensitivity and 75 % specificity in grading AS severity in patients  with classical low-flow, low-gradient AS. Proposed different cut-off values to define severe AS for men and women as 2,065 AU and  1,274 AU, respectively. The joint European and American recommendations for the assessment of AS consider the aortic valve calcium score as a continuum - a very high calcium score suggests severe AS and a low calcium score suggests severe AS is unlikely.  Sunday Shams, et al. 2017 ESC/EACTS Guidelines for the management of valvular heart disease. Eur Heart J 2017;38:2739-91.  Coronary artery calcium (CAC) score is a strong predictor of incident coronary heart disease (CHD) and provides predictive information beyond traditional risk factors. CAC scoring is reasonable to use in the decision to withhold, postpone, or initiate statin therapy in intermediate-risk or selected borderline-risk asymptomatic adults (age 59-75 years and LDL-C >=70 to <190 mg/dL) who do not have diabetes or established atherosclerotic cardiovascular disease (ASCVD).* In intermediate-risk (10-year ASCVD risk >=7.5% to <20%) adults or selected borderline-risk (10-year ASCVD risk >=5% to <7.5%) adults in whom a CAC score is measured for the purpose of making a treatment decision the following recommendations have been made:  If CAC = 0, it is reasonable to withhold statin therapy and reassess in 5 to 10 years, as long as higher risk conditions are absent (diabetes mellitus, family history of premature CHD in first degree relatives (males <55 years; females <65 years), cigarette smoking, LDL >=190 mg/dL or other independent risk factors).  If CAC is 1 to 99, it is reasonable to initiate statin therapy for patients >=72 years of age.  If CAC is >=100 or >=75th percentile, it is reasonable to initiate statin therapy at any age.  Cardiology referral should be considered for patients with CAC scores =400 or >=75th percentile.  *2018 AHA/ACC/AACVPR/AAPA/ABC/ACPM/ADA/AGS/APhA/ASPC/NLA/PCNA Guideline on the Management of Blood Cholesterol: A Report of the American College of Cardiology/American  Heart Association Task Force on Clinical Practice Guidelines. J Am Coll Cardiol. 2019;73(24):3168-3209.  Riley Lam, MD  Electronically Signed: By: Riley Lam M.D. On: 02/13/2023 17:31          Risk Assessment/Calculations:      Lab Results  Component Value Date   WBC 7.6 11/04/2022   HGB 16.7 11/04/2022   HCT 54.5 (H) 11/04/2022   MCV 81.1 11/04/2022   PLT 222 11/04/2022   Lab Results  Component Value Date   CREATININE 1.35 (H) 11/05/2022   BUN 23 11/04/2022   NA 141 11/04/2022   K 4.9 11/04/2022   CL 106 11/04/2022   CO2 30 11/04/2022   Lab Results  Component Value Date   ALT 37 05/26/2021   AST 31 05/26/2021   ALKPHOS 45 05/26/2021   BILITOT 0.4 05/26/2021   Lab Results  Component Value Date   CHOL 153 02/26/2023   HDL 56 02/26/2023   LDLCALC 77 02/26/2023   LDLDIRECT 147.8 03/20/2009   TRIG 110 02/26/2023   CHOLHDL 2.7 02/26/2023    Lab Results  Component Value Date   HGBA1C 7.6 (H) 09/30/2012     Assessment & Plan    1.  History of symptomatic PVCs: -s/p prior PVC ablation 10/2022 with recurrence of palpitations.  Patient underwent ZIO monitor that showed 1% PVC burden -Today patient reports no recurrence of chest pain or frequent palpitations since previous visit. -He is currently prescribed atenolol 25 mg in addition to Lopressor 25 mg twice daily.  We will discontinue atenolol today. -Continue metoprolol 25 mg twice daily -He is currently wearing an event monitor  prescribed by [facility] cardiologist to quantify PVC burden. -He is aware to contact our office if PVCs increase in intensity and are not relieved with beta-blocker. -Patient was advised to bring all medications to next visit due to polypharmacy and confusion regarding his current therapeutic regimen.  2.  Chest pain: -Patient underwent coronary CTA that showed calcium score of more than 16 with moderate stenosis and FFR showing no evidence of flow-limiting  blockages. -Today patient reports that his chest pain has resolved and has decreased in intensity and duration since previous visit. -Continue Ranexa 500 mg twice daily  3.  Essential hypertension: -Patient's blood pressure today was slightly elevated at 142/70.  He reports BPs are controlled at home. -Continue Norvasc 10 mg daily, losartan 12.5 mg daily, metoprolol 25 mg twice daily  4.  Hyperlipidemia: -Patient's last LDL was 77 -Continue pravastatin 40 mg daily  5 IDDM type II: -Patient's PCP currently following and managing diabetes. -Continue current treatment plan as prescribed.      Disposition: Follow-up with None or APP in 3 months    Medication Adjustments/Labs and Tests Ordered: Current medicines are reviewed at length with the patient today.  Concerns regarding medicines are outlined above.   Signed, Napoleon Form, Leodis Rains, NP 03/31/2023, 12:36 PM [facility]

## 2023-03-31 ENCOUNTER — Ambulatory Visit: Payer: No Typology Code available for payment source | Attending: Nurse Practitioner | Admitting: Nurse Practitioner

## 2023-03-31 ENCOUNTER — Encounter: Payer: Self-pay | Admitting: Nurse Practitioner

## 2023-03-31 VITALS — BP 142/70 | HR 67 | Ht 68.0 in | Wt 250.2 lb

## 2023-03-31 DIAGNOSIS — R0789 Other chest pain: Secondary | ICD-10-CM | POA: Diagnosis not present

## 2023-03-31 DIAGNOSIS — E1140 Type 2 diabetes mellitus with diabetic neuropathy, unspecified: Secondary | ICD-10-CM

## 2023-03-31 DIAGNOSIS — I10 Essential (primary) hypertension: Secondary | ICD-10-CM | POA: Diagnosis not present

## 2023-03-31 DIAGNOSIS — Z794 Long term (current) use of insulin: Secondary | ICD-10-CM

## 2023-03-31 DIAGNOSIS — E785 Hyperlipidemia, unspecified: Secondary | ICD-10-CM | POA: Diagnosis not present

## 2023-03-31 DIAGNOSIS — I493 Ventricular premature depolarization: Secondary | ICD-10-CM | POA: Diagnosis not present

## 2023-03-31 NOTE — Patient Instructions (Addendum)
Medication Instructions:  TAKE ALL YOUR MORNING MEDICATIONS AFTER EATING BREAKFAST  STOP Atenolol TAKE Norvasc in the evening   *If you need a refill on your cardiac medications before your next appointment, please call your pharmacy*   Lab Work: NONE ORDERED   Testing/Procedures: NONE ORDERED   Follow-Up: At [facility], you and your health needs are our priority.  As part of our continuing mission to provide you with exceptional heart care, we have created designated Provider Care Teams.  These Care Teams include your primary Cardiologist (physician) and Advanced Practice Providers (APPs -  Physician Assistants and Nurse Practitioners) who all work together to provide you with the care you need, when you need it.  We recommend signing up for the patient portal called "MyChart".  Sign up information is provided on this After Visit Summary.  MyChart is used to connect with patients for Virtual Visits (Telemedicine).  Patients are able to view lab/test results, encounter notes, upcoming appointments, etc.  Non-urgent messages can be sent to your provider as well.   To learn more about what you can do with MyChart, go to [email or web address].    Your next appointment:   3 month(s)  Provider:   Meriam Sprague, MD or Robin Searing, NP  Other Instructions

## 2023-05-12 ENCOUNTER — Ambulatory Visit (HOSPITAL_BASED_OUTPATIENT_CLINIC_OR_DEPARTMENT_OTHER)
Admission: RE | Admit: 2023-05-12 | Discharge: 2023-05-12 | Disposition: A | Discharge status: Home / Self Care | Payer: No Typology Code available for payment source | Source: Ambulatory Visit | Attending: Pulmonary Disease | Admitting: Pulmonary Disease

## 2023-05-12 ENCOUNTER — Telehealth: Payer: Self-pay | Admitting: Pulmonary Disease

## 2023-05-12 DIAGNOSIS — R911 Solitary pulmonary nodule: Secondary | ICD-10-CM | POA: Diagnosis present

## 2023-05-12 LAB — POCT I-STAT CREATININE: Creatinine, Ser: 1.5 mg/dL — ABNORMAL HIGH (ref 0.61–1.24)

## 2023-05-12 MED ORDER — IOHEXOL 300 MG/ML  SOLN
100.0000 mL | Freq: Once | INTRAMUSCULAR | Status: AC | PRN
  Administered 2023-05-12: 75 mL via INTRAVENOUS

## 2023-05-12 NOTE — Telephone Encounter (Signed)
Mychart message sent.  CT scan is available for [place] to view at [facility]. No disc needed for visit.

## 2023-05-12 NOTE — Telephone Encounter (Signed)
PT went for CT scan and they gave him the disc. Does he bring it with him to appt or will our Dr. have it since it was w/I [facility]. Please call to advise @ [phone]

## 2023-05-16 ENCOUNTER — Ambulatory Visit (HOSPITAL_BASED_OUTPATIENT_CLINIC_OR_DEPARTMENT_OTHER): Payer: No Typology Code available for payment source | Admitting: Pulmonary Disease

## 2023-05-16 ENCOUNTER — Encounter (HOSPITAL_BASED_OUTPATIENT_CLINIC_OR_DEPARTMENT_OTHER): Payer: Self-pay | Admitting: Pulmonary Disease

## 2023-05-16 VITALS — BP 128/70 | HR 63 | Ht 68.0 in | Wt 246.9 lb

## 2023-05-16 DIAGNOSIS — J439 Emphysema, unspecified: Secondary | ICD-10-CM

## 2023-05-16 DIAGNOSIS — R911 Solitary pulmonary nodule: Secondary | ICD-10-CM | POA: Diagnosis not present

## 2023-05-16 DIAGNOSIS — J4489 Other specified chronic obstructive pulmonary disease: Secondary | ICD-10-CM | POA: Diagnosis not present

## 2023-05-16 NOTE — Patient Instructions (Addendum)
  New right lower lobe nodule 10 mm>110mm Hx lung cancer s/p LUL lobectomy 2013 --[facility] currently following [facility] --Scheduled for navigational bronchoscopy on 05/21/23 --Recommend continuing care with [facility] for now and can return to [facility] afterwards  COPD with emphysema Moderate restrictive and obstructive defect in setting of lobectomy and emphysema --CONTINUE Spiriva TWO puffs in the morning --CONTINUE Wixela 250-50 ONE puff in the morning and ONE puff in the evening --CONTINUE Albuterol AS NEEDED for shortness of breath or wheezing --CONTINUE mucinex twice a day  --Not a candidate for chronic macrolide due to arrhythmia and on other QTC prolongation drugs

## 2023-05-16 NOTE — Progress Notes (Signed)
Subjective:   PATIENT ID: Jose Patrick GENDER: male DOB: 1951-10-08, MRN: [id number]   HPI  Chief Complaint  Patient presents with   Follow-up    3 mo f/u after CT scan. States his breathing has been stable since last visit.     Reason for Visit: Follow-up  Jose Patrick is a 72 year old male with hx lung cancer s/p left upper lobe lobectomy in 2013, COPD with emphysema, OSA nonadherent to CPAP, DM, HTN, reflux who presents for follow-up.  Synopsis He was diagnosed with COPD for > 30 years ago. At baseline has shortness of breath, non-productive coughing, chest congestion and wheezing however not optimized on bronchodilator regimen. Despite his symptoms he is active at baseline, swimming most days. Also enjoys the sauna to help his mind and breathing.   2023 - Completed Pulm Rehab in Jan. Intolerant of Spiriva due to throat irritation. Changed from Spiriva to Atrovent due to coverage. Had two exacerbations at the end of the year. 2024 - Retrial Spiriva due to persistent symptoms. Outpatient exacerbation in Jan and March. PET/CT 01/22/23 with interval development of RLL lung nodule concerning for recurrent cancer  01/24/23 Since our last visit he has PET scan which demonstrated hypermetabolic right lower lobe pulmonary nodule measuring 10mm. No mediastinal nodal mets. No recurrence in LUL. This nodule was discussed at [facility] tumor board on 3/7 and recommended follow-up CT in 3 months. He and his family have been anxious. He is worried this will delay his cardiac management which has been causing him fatigue and palpitations. Frustrated that all his doctors in the same place and not communicating together.  He continues to have productive cough with green sputum. Associated with wheezing and shortness of breath. Worsening by his palpitations. Denies fevers, chills.   05/16/23 Since our last visit he reports general fatigue but is somewhat improved after his ablation back in  December. Compliant with his Wixela and Spiriva and feels it helps. Still coughing productive cough daily. Has been taking mucinex twice a day. Regarding his chest imaging, he has had confusion about who his doctors are and was recently seen by [facility] with Dr. Arabella Merles on 05/01/23 who reviewed CT chest demonstrating enlargement of RLL nodule from 10>12 mm. He is now scheduled for navigational bronchoscopy 05/21/23.  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 Quit 10 years. 62 pack-years.   Past Medical History:  Diagnosis Date   ALLERGIC RHINITIS 10/08/2007   Qualifier: Diagnosis of  By: Jonny Ruiz MD, Len Blalock    BENIGN PROSTATIC HYPERTROPHY 09/28/2010   Qualifier: Diagnosis of  By: Jonny Ruiz MD, Len Blalock    Bipolar affective disorder (HCC) 09/30/2012   COLONIC POLYPS, HX OF 10/08/2007   Qualifier: Diagnosis of  By: Jonny Ruiz MD, Len Blalock    Complication of anesthesia    sleeps awhile afterwards per patient   CONSTIPATION, CHRONIC, HX OF 08/10/2007   Qualifier: Diagnosis of  By: Maris Berger    DIABETES MELLITUS, TYPE II 10/08/2007   Qualifier: Diagnosis of  By: Jonny Ruiz MD, Len Blalock    ERECTILE DYSFUNCTION 10/08/2007   Qualifier: Diagnosis of  By: Jonny Ruiz MD, Len Blalock    HYPERLIPIDEMIA 10/08/2007   Qualifier: Diagnosis of  By: Jonny Ruiz MD, Len Blalock    HYPERTENSION 03/20/2009   Qualifier: Diagnosis of  By: Jonny Ruiz MD, Len Blalock    PTSD (post-traumatic stress disorder) 09/30/2012   SPINAL STENOSIS, LUMBAR 10/08/2007   Per Dr Lovell Sheehan -  [facility]   Qualifier: Diagnosis of  By: Jonny Ruiz MD, Len Blalock       Family History  Problem Relation Age of Onset   Cancer Mother    Ulcers Mother        Stomach   Depression Mother    Anxiety disorder Mother    Allergies Sister      Social History   Occupational History   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, 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  Tobacco Use   Smoking status: Former     Packs/day: 1.50    Years: 41.00    Additional pack years: 0.00    Total pack years: 61.50    Types: Cigarettes    Quit date: 2013    Years since quitting: 11.4   Smokeless tobacco: Never  Vaping Use   Vaping Use: Never used  Substance and Sexual Activity   Alcohol use: Yes   Drug use: No   Sexual activity: Not on file    Allergies  Allergen Reactions   Chlorzoxazone Swelling     Outpatient Medications Prior to Visit  Medication Sig Dispense Refill   albuterol (VENTOLIN HFA) 108 (90 Base) MCG/ACT inhaler Inhale 2 puffs into the lungs every 6 (six) hours as needed for wheezing or shortness of breath. 18 g 5   amLODipine (NORVASC) 10 MG tablet Take 10 mg by mouth every evening.     aspirin 81 MG chewable tablet Chew 1 tablet (81 mg total) by mouth daily. 90 tablet 3   Cholecalciferol (VITAMIN D) 2000 UNITS CAPS Take 1 capsule by mouth 2 (two) times daily.      clonazePAM (KLONOPIN) 0.5 MG tablet Take 0.5 mg by mouth 2 (two) times daily.     fluticasone-salmeterol (WIXELA INHUB) 250-50 MCG/ACT AEPB ONE puff in the in morning and ONE puff in the evening 180 each 1   gabapentin (NEURONTIN) 300 MG capsule Take 300 mg by mouth 2 (two) times daily.     guaiFENesin (MUCINEX) 600 MG 12 hr tablet Take 1 tablet (600 mg total) by mouth 2 (two) times daily as needed for to loosen phlegm or cough. 180 tablet 6   insulin glargine (LANTUS) 100 UNIT/ML injection 20 Units.     JARDIANCE 10 MG TABS tablet Take 10 mg by mouth daily.     losartan (COZAAR) 25 MG tablet Take 25 mg by mouth daily. 1/2 tab daily     metFORMIN (GLUCOPHAGE) 1000 MG tablet Take 1,000 mg by mouth 2 (two) times daily.     metoprolol tartrate (LOPRESSOR) 25 MG tablet Take 1 tablet (25 mg total) by mouth 2 (two) times daily. 180 tablet 1   promethazine-dextromethorphan (PROMETHAZINE-DM) 6.25-15 MG/5ML syrup Take 5 mLs by mouth 4 (four) times daily as needed for cough. 118 mL 0   psyllium (METAMUCIL SMOOTH TEXTURE) 58.6 % powder  Take 1 packet by mouth daily. 90 packet 1   Pyridoxine HCl (VITAMIN B6 PO) Take 1 tablet by mouth daily.      ranolazine (RANEXA) 500 MG 12 hr tablet Take 1 tablet (500 mg total) by mouth 2 (two) times daily. 180 tablet 3   Semaglutide,0.25 or 0.5MG /DOS, (OZEMPIC, 0.25 OR 0.5 MG/DOSE,) 2 MG/1.5ML SOPN Inject 0.5 mg into the skin once a week.     sitaGLIPtin (JANUVIA) 100 MG tablet Take 100 mg by mouth daily.     Testosterone 20.25 MG/ACT (1.62%) GEL testosterone 20.25 mg/1.25 gram  per pump act.(1.62 %) transdermal gel     Tiotropium Bromide Monohydrate (SPIRIVA RESPIMAT) 2.5 MCG/ACT AERS Inhale 2 puffs into the lungs daily. 4 g 5   famotidine (PEPCID) 20 MG tablet Take 1 tablet (20 mg total) by mouth at bedtime. 90 tablet 2   metoprolol tartrate (LOPRESSOR) 50 MG tablet Take tablet TWO hours prior to your cardiac CT scan. 1 tablet 0   No facility-administered medications prior to visit.    Review of Systems  Constitutional:  Negative for chills, diaphoresis, fever, malaise/fatigue and weight loss.  HENT:  Negative for congestion.   Respiratory:  Positive for cough and sputum production. Negative for hemoptysis, shortness of breath and wheezing.   Cardiovascular:  Negative for chest pain, palpitations and leg swelling.     Objective:   Vitals:   05/16/23 1059  BP: 128/70  Pulse: 63  SpO2: 97%  Weight: 246 lb 14.6 oz (112 kg)  Height: 5\' 8"  (1.727 m)   SpO2: 97 % (on RA)  Physical Exam: General: Well-appearing, no acute distress HENT: [place], AT Eyes: EOMI, no scleral icterus Respiratory: Clear to auscultation bilaterally.  No crackles, wheezing or rales Cardiovascular: RRR, -M/R/G, no JVD Extremities:-Edema,-tenderness Neuro: AAO x4, CNII-XII grossly intact Psych: Normal mood, normal affect  Data Reviewed:  Imaging: PET/CT 01/01/12 2.2 cm LUL hypermetabolic nodule in LUL. Paraseptal and centrilobular emphysema.  CXR 11/04/22 - Bibasilar atelectasis  PET/CT 01/22/23  (Report  only) IMPRESSION: 1. Hypermetabolic RIGHT lower lobe pulmonary nodule concerning for  bronchogenic carcinoma.. Recommend tissue sampling versus short-term  CT follow-up.  2. No evidence of mediastinal nodal metastasis or distant metastatic  disease.  3. No evidence of lung cancer recurrence in the LEFT upper lobe.  4. Aortic atherosclerosis.   CT Chest 04/28/23 - Enlargement of RLL to 12 mm  CT Chest 05/12/23 - RLL nodule measuring 10 mm, unchanged size and previously hypermetabolic on imaging  PFT: 04/29/22 FVC 2.49 (70%) FEV1 1.59 (59%) Ratio 64  TLC 70% DLCO 66% Interpretation: Moderately severe obstructive and restrictive defect with mildly reduced DLCO.  Labs: CBC    Component Value Date/Time   WBC 7.6 11/04/2022 1709   RBC 6.72 (H) 11/04/2022 1709   HGB 16.7 11/04/2022 1709   HCT 54.5 (H) 11/04/2022 1709   PLT 222 11/04/2022 1709   MCV 81.1 11/04/2022 1709   MCH 24.9 (L) 11/04/2022 1709   MCHC 30.6 11/04/2022 1709   RDW 19.0 (H) 11/04/2022 1709   LYMPHSABS 2.6 11/04/2022 1709   MONOABS 0.8 11/04/2022 1709   EOSABS 0.1 11/04/2022 1709   BASOSABS 0.0 11/04/2022 1709   Abs eos 05/26/21 - 0     Assessment & Plan:   Discussion: 72 year old male with history of lung cancer status post left upper lobe lobectomy in 2013, COPD with emphysema, OSA not on CPAP, diabetes type 2, hypertension, reflux who presents for follow-up exacerbation from last visit has resolved and symptoms overall controlled on triple therapy except for productive cough.  Regarding his RLL nodule he has had multiple CT scans in different hospital systems including [facility] CT 04/28/23 demonstrating enlarging pulmonary nodule and [facility] CT demonstrating stable nodule. His case has previously been discussed at [facility] Tumor board favoring surveillance. At this point, patient is scheduled for bronchoscopy with [facility] due to enlarging lesion noted. I brought this to the attention to patient that has duplicate specialists  caring for the same medical issues. After discussion would be best for him to follow through with his care at  [facility] since he wishes to pursue bronchoscopy.  New right lower lobe nodule 10 mm>41mm Hx lung cancer s/p LUL lobectomy 2013 --[facility] currently following [facility] and will continue care with them until bronchoscopy results --Scheduled for navigational bronchoscopy on 05/21/23  COPD with emphysema Moderate restrictive and obstructive defect in setting of lobectomy and emphysema --CONTINUE Spiriva TWO puffs in the morning --CONTINUE Wixela 250-50 ONE puff in the morning and ONE puff in the evening --CONTINUE Albuterol AS NEEDED for shortness of breath or wheezing --CONTINUE mucinex twice a day  --Not a candidate for chronic macrolide due to arrhythmia and on other QTC prolongation drugs  OSA Managed at [facility] Maintenance Immunization History  Administered Date(s) Administered   Dtap, Unspecified 08/17/1958, 09/15/1958, 11/13/1960   Fluad Quad(high Dose 65+) 07/31/2020, 09/21/2021   Influenza Split 09/19/2014   Influenza Whole 10/12/2007, 09/28/2010   Influenza, High Dose Seasonal PF 09/03/2017, 08/13/2019   Influenza, Seasonal, Injecte, Preservative Fre 08/12/2016   Influenza-Unspecified 08/06/2011, 10/18/2012, 08/26/2013, 09/13/2015   Moderna Covid-19 Vaccine Bivalent Booster 11yrs & up 09/21/2021   Moderna Sars-Covid-2 Vaccination 12/22/2019, 01/19/2020, 11/02/2020   Pneumococcal Conjugate-13 11/21/2016   Pneumococcal Polysaccharide-23 12/06/2008, 01/19/2018   Pneumococcal-Unspecified 11/29/2011   Polio, Unspecified 07/11/1958, 08/17/1958, 11/13/1960   Smallpox 07/16/1958   Td 12/06/2008   Tdap 11/29/2011, 10/16/2021   Zoster Recombinat (Shingrix) 02/24/2019, 05/27/2019   Zoster, Live 11/03/2012   CT Lung Screen - hx lung cancer. Followed by Oncology at the [facility]  No orders of the defined types were placed in this encounter.  No orders of the defined types were  placed in this encounter.   Return in about 1 year (around 05/15/2024).  I have spent a total time of 35-minutes on the day of the appointment including chart review, data review, collecting history, coordinating care and discussing medical diagnosis and plan with the patient/family. Past medical history, allergies, medications were reviewed. Pertinent imaging, labs and tests included in this note have been reviewed and interpreted independently by me.  Margarite Vessel Mechele Collin, MD [facility] 05/16/2023 11:26 AM  Office Number [phone]

## 2023-05-23 IMAGING — DX DG CHEST 1V PORT
1 series · 1 of 1 positions shown · non-contrast
Comparison: 07/16/2011

CLINICAL DATA: Cough and shortness of breath.  COVID positive

EXAM:
PORTABLE CHEST 1 VIEW

[chest ap]
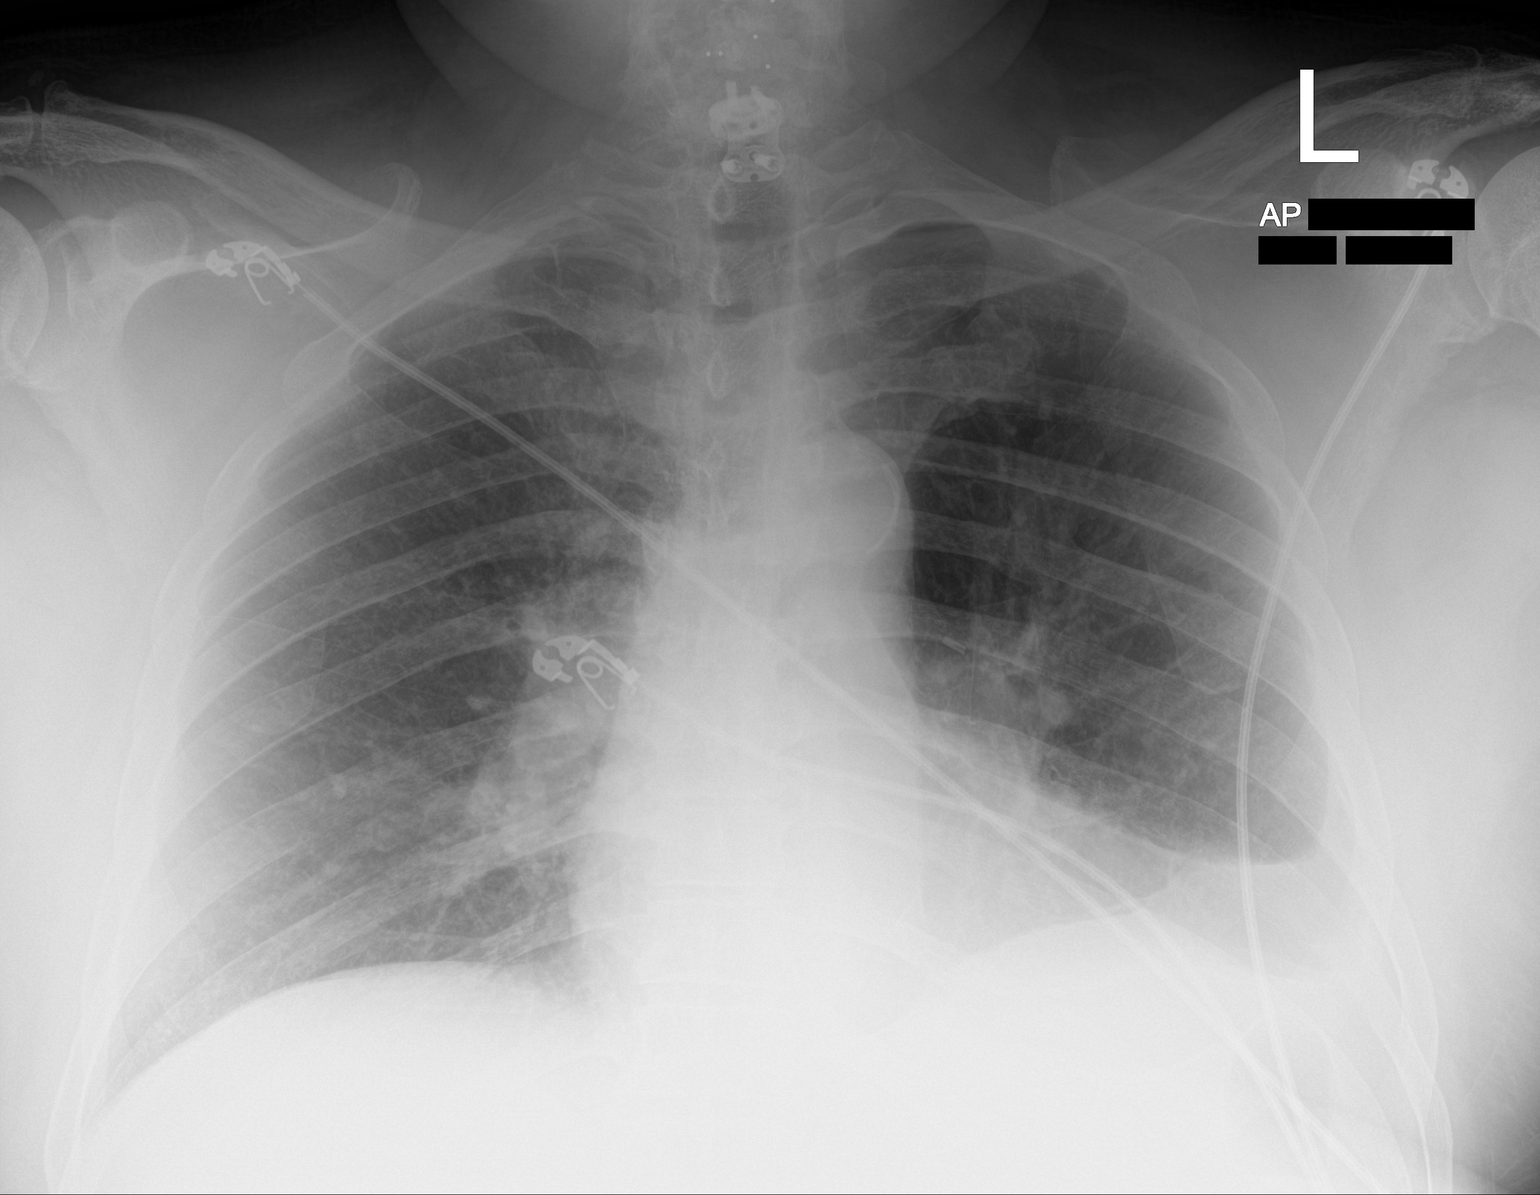

[1 of 1 positions shown; findings below may reference images not displayed]

FINDINGS: Normal heart size. Blunting at the lateral left costophrenic sulcus
which is likely postoperative given history of lobectomy.
Postoperative changes at the left hilum. No focal infiltrate. No
edema.
IMPRESSION: No visible pneumonia.

## 2023-06-27 ENCOUNTER — Ambulatory Visit: Payer: No Typology Code available for payment source | Admitting: Cardiology

## 2023-07-15 ENCOUNTER — Ambulatory Visit: Payer: Federal, State, Local not specified - PPO | Attending: Cardiology | Admitting: Cardiology

## 2023-07-16 ENCOUNTER — Encounter: Payer: Self-pay | Admitting: Cardiology

## 2023-07-17 ENCOUNTER — Telehealth: Payer: Self-pay | Admitting: Cardiology

## 2023-07-17 NOTE — Telephone Encounter (Signed)
Patient is requesting "No Show" from past appt on 08/27 be removed and no charges to his account.

## 2023-07-29 NOTE — Progress Notes (Unsigned)
Cardiology Office Note:   Date:  07/30/2023  ID:  Jose Patrick, DOB 17-Sep-1951, MRN [id number] PCP:  [facility] Providers Cardiologist:  Alverda Skeans, MD Referring MD: Virginia Rochester, FNP   Chief Complaint/Reason for Referral: Cardiology follow-up ASSESSMENT:    1. Frequent PVCs   2. Type 2 diabetes mellitus with complication, with long-term current use of insulin (HCC)   3. Hypertension associated with diabetes (HCC)   4. Hyperlipidemia associated with type 2 diabetes mellitus (HCC)   5. Stage 3b chronic kidney disease (HCC)   6. Chronic obstructive pulmonary disease, unspecified COPD type (HCC)   7. Malignant neoplasm of lung, unspecified laterality, unspecified part of lung (HCC)   8. BMI 38.0-38.9,adult   9. Aortic atherosclerosis (HCC)   10. History of cigarette smoking     PLAN:   In order of problems listed above: 1.  PVCs: Continue Lopressor. 2.  Type 2 diabetes: Continue aspirin, Jardiance, losartan, Ozempic, and pravastatin 40 mg daily. 3.  Hypertension: BP is well controlled today 4.  Hyperlipidemia: Check lipid panel, LFTs, LP(a) today.  Continue statin 5.  Stage III chronic kidney disease: Continue losartan and Jardiance for renal protection. 6.  COPD: Per pulmonary medicine. 7.  Malignant neoplasm of lung: Will obtain echocardiogram as baseline now. 8.  Elevated BMI: Continue Ozempic. 9.  Aortic atherosclerosis: Continue aspirin, start statin as detailed above, and pursue strict blood pressure control. 10.  History of tobacco abuse: The patient does have abnormal ABIs from 2023 but he has no reports of claudication.  Will refer for screening abdominal ultrasound.             Dispo:  No follow-ups on file.      Medication Adjustments/Labs and Tests Ordered: Current medicines are reviewed at length with the patient today.  Concerns regarding medicines are outlined above.  The following changes have been made:      Labs/tests ordered: Orders Placed This Encounter  Procedures   Lipid panel   Lipoprotein A (LPA)   Hepatic function panel   EKG 12-Lead   EKG 12-Lead   ECHOCARDIOGRAM COMPLETE   VAS [place] AAA DUPLEX    Medication Changes: No orders of the defined types were placed in this encounter.   Current medicines are reviewed at length with the patient today.  The patient does not have concerns regarding medicines.  History of Present Illness:   FOCUSED PROBLEM LIST:   PVCs status post RVOT ablation (incomplete) 2023 [facility] Type 2 diabetes on insulin Hypertension Hyperlipidemia COPD CT FFR negative coronary artery disease 2024 CKD stage III BMI 38 Lung cancer s/p left lobectomy 2013 with recurrence on right Aortic atherosclerosis on chest CT 2024 OSA on CPAP  September 2024: The patient is a 72 y.o. male with the indicated medical history here for routine follow-up.  He was seen in May of this year and was doing well.  His medical therapy was continued with no significant changes.  He was recently diagnosed with lung cancer and is supposed to start radiation therapy very soon.  Apparently he had a partial lobectomy a few years ago and unfortunately he has developed recurrence of his lung cancer that is more aggressive on the right side.  He is to undergo radiation therapy for a few sessions.  Previously when he saw cardiology it was after he had had an ablation.  Apparently the ablation was incomplete.  He was referred for a monitor which showed a very small  burden of PVCs.  He was also referred for ABIs which were abnormal.  He is referred for a coronary CTA due to a high amount of coronary calcification which was reassuring.  The patient denies any exertional angina.  He occasionally gets a fleeting chest pains that last maybe seconds.  They are not necessarily associated with exertion.  He denies any severe dyspnea.  He does get cramping in his legs but not with exertion as this mostly  happens at night.  He has had no signs or symptoms of stroke.  He denies any peripheral edema, paroxysmal nocturnal dyspnea, orthopnea.  He is very insightful in regards to his medications and is compliant with this.  He fortunately has not required any emergency room visits or hospitalizations.  He has quite a bit of stress at home he tells me and this is led to some trouble sleeping.    Current Medications: Current Meds  Medication Sig   albuterol (VENTOLIN HFA) 108 (90 Base) MCG/ACT inhaler Inhale 2 puffs into the lungs every 6 (six) hours as needed for wheezing or shortness of breath.   amLODipine (NORVASC) 10 MG tablet Take 10 mg by mouth every evening.   aspirin 81 MG chewable tablet Chew 1 tablet (81 mg total) by mouth daily.   Cholecalciferol (VITAMIN D) 2000 UNITS CAPS Take 1 capsule by mouth daily.   fluticasone-salmeterol (WIXELA INHUB) 250-50 MCG/ACT AEPB ONE puff in the in morning and ONE puff in the evening   gabapentin (NEURONTIN) 300 MG capsule Take 300 mg by mouth 2 (two) times daily.   guaiFENesin (MUCINEX) 600 MG 12 hr tablet Take 1 tablet (600 mg total) by mouth 2 (two) times daily as needed for to loosen phlegm or cough.   insulin glargine (LANTUS) 100 UNIT/ML injection 20 Units.   JARDIANCE 10 MG TABS tablet Take 10 mg by mouth daily.   losartan (COZAAR) 25 MG tablet Take 25 mg by mouth daily. 1/2 tab daily   metFORMIN (GLUCOPHAGE) 1000 MG tablet Take 1,000 mg by mouth 2 (two) times daily.   promethazine-dextromethorphan (PROMETHAZINE-DM) 6.25-15 MG/5ML syrup Take 5 mLs by mouth 4 (four) times daily as needed for cough.   psyllium (METAMUCIL SMOOTH TEXTURE) 58.6 % powder Take 1 packet by mouth daily.   Pyridoxine HCl (VITAMIN B6 PO) Take 1 tablet by mouth daily.    ranolazine (RANEXA) 500 MG 12 hr tablet Take 1 tablet (500 mg total) by mouth 2 (two) times daily.   Semaglutide,0.25 or 0.5MG /DOS, (OZEMPIC, 0.25 OR 0.5 MG/DOSE,) 2 MG/1.5ML SOPN Inject 0.5 mg into the skin once  a week.   sitaGLIPtin (JANUVIA) 100 MG tablet Take 100 mg by mouth daily.   Testosterone 20.25 MG/ACT (1.62%) GEL testosterone 20.25 mg/1.25 gram per pump act.(1.62 %) transdermal gel   Tiotropium Bromide Monohydrate (SPIRIVA RESPIMAT) 2.5 MCG/ACT AERS Inhale 2 puffs into the lungs daily.   [DISCONTINUED] clonazePAM (KLONOPIN) 0.5 MG tablet Take 0.5 mg by mouth 2 (two) times daily.   [DISCONTINUED] metoprolol tartrate (LOPRESSOR) 25 MG tablet Take 1 tablet (25 mg total) by mouth 2 (two) times daily.     Allergies:    Chlorzoxazone   Social History:   Social History   Tobacco Use   Smoking status: Former    Current packs/day: 0.00    Average packs/day: 1.5 packs/day for 41.0 years (61.5 ttl pk-yrs)    Types: Cigarettes    Start date: 62    Quit date: 2013    Years since quitting:  11.7   Smokeless tobacco: Never  Vaping Use   Vaping status: Never Used  Substance Use Topics   Alcohol use: Yes   Drug use: No     Family Hx: Family History  Problem Relation Age of Onset   Cancer Mother    Ulcers Mother        Stomach   Depression Mother    Anxiety disorder Mother    Allergies Sister      Review of Systems:   Please see the history of present illness.    All other systems reviewed and are negative.     EKGs/Labs/Other Test Reviewed:   EKG: ECG performed March 2024 that I personally reviewed demonstrates normal sinus rhythm and nonspecific T wave abnormality  EKG Interpretation Date/Time:  Wednesday July 30 2023 11:06:27 EDT Ventricular Rate:  70 PR Interval:  170 QRS Duration:  80 QT Interval:  414 QTC Calculation: 447 R Axis:   -19  Text Interpretation: Normal sinus rhythm with sinus arrhythmia Normal ECG When compared with ECG of 30-Jul-2023 11:05, No significant change was found Confirmed by Alverda Skeans ([id number]) on 07/30/2023 11:10:34 AM        Prior CV studies reviewed: Cardiac Studies & Procedures         MONITORS  LONG TERM MONITOR (3-14  DAYS) 01/24/2023  Narrative   Patch weart time was 2 days and 22 hours   Predominant rhythm was NSR with average HR 78bpm   There were 4 runs of SVT with longest lasting 5 beats   Rare SVE (<1%), rare VE (<1%)   No sustained arrhythmias or significant pauses   Patient triggered events correlated with NSR with a PAC   Patch Wear Time:  2 days and 22 hours (2024-02-20T11:35:58-0500 to 2024-02-23T09:37:57-0500)  Patient had a min HR of 55 bpm, max HR of 141 bpm, and avg HR of 78 bpm. Predominant underlying rhythm was Sinus Rhythm. 4 Supraventricular Tachycardia runs occurred, the run with the fastest interval lasting 4 beats with a max rate of 141 bpm, the longest lasting 5 beats with an avg rate of 116 bpm. Isolated SVEs were rare (<1.0%), SVE Couplets were rare (<1.0%), and SVE Triplets were rare (<1.0%). Isolated VEs were rare (<1.0%, 516), VE Couplets were rare (<1.0%, 3), and VE Triplets were rare (<1.0%, 2). Ventricular Bigeminy was present.  Laurance Flatten, MD   CT SCANS  CT CORONARY MORPH W/CTA COR W/SCORE 02/13/2023  Addendum 02/15/2023  9:57 PM ADDENDUM REPORT: 02/15/2023 21:54  EXAM: OVER-READ INTERPRETATION  CT CHEST  The following report is an over-read performed by radiologist Dr. Aram Candela of [facility] on 02/15/2023. This over-read does not include interpretation of cardiac or coronary anatomy or pathology. The coronary calcium score/coronary CTA interpretation by the cardiologist is attached.  COMPARISON:  None.  FINDINGS: Cardiovascular: There are no significant extracardiac vascular findings.  Mediastinum/Nodes: There are no enlarged lymph nodes within the visualized mediastinum.  Lungs/Pleura: There is no pleural effusion. 3 mm and 4 mm noncalcified lung nodules are seen within the inferior aspect of the right upper lobe (axial CT image 9, CT series 13).  Upper abdomen: No significant findings in the visualized  upper abdomen.  Musculoskeletal/Chest wall: No chest wall mass or suspicious osseous findings within the visualized chest.  IMPRESSION: 3 mm and 4 mm noncalcified right upper lobe lung nodules. No follow-up needed if patient is low-risk (and has no known or suspected primary neoplasm). Non-contrast chest CT can be considered in  12 months if patient is high-risk. This recommendation follows the consensus statement: Guidelines for Management of Incidental Pulmonary Nodules Detected on CT Images: From the Fleischner Society 2017; Radiology 2017; 284:228-243.   Electronically Signed By: Aram Candela M.D. On: 02/15/2023 21:54  Narrative CLINICAL DATA:  72 Year-old Male  EXAM: Cardiac/Coronary  CTA  TECHNIQUE: The patient was scanned on a [employer].  FINDINGS: Scan was triggered in the descending thoracic aorta. Axial non-contrast 3 mm slices were carried out through the heart. The data set was analyzed on a dedicated work station and scored using the Agatson method. Gantry rotation speed was 250 msecs and collimation was .6 mm. 0.8 mg of sl NTG was given. The 3D data set was reconstructed in 5% intervals of the 67-82 % of the R-R cycle. Diastolic phases were analyzed on a dedicated work station using MPR, MIP and VRT modes. The patient received 100 cc of contrast.  Coronary Arteries:  Normal coronary origin.  Right dominance.  Coronary Calcium Score:  Left main: 43  Left anterior descending artery: 145  Left circumflex artery: 227  Right coronary artery: 1  Ramus intermedius artery: 0  Total: 416  Percentile: 84th for age, sex, and race matched control.  RCA is a large dominant artery that gives rise to PDA and PLA, SA node vessel. There is no significant plaque.  Left main is a large artery that gives rise to LAD, RI, and LCX arteries. Minimal non-obstructive calcified plaque (1-24%) in the distal vessel.  LAD is a large vessel that gives  rise to one small diagonal vessel. Minimal non-obstructive calcified plaque (1-24%) in the mid LAD.  LCX is a non-dominant artery that gives rise to one OM1 branch. Minimal non-obstructive calcified plaque (1-24%) in the mid LCX.  There is a ramus intermedius vessel with moderate calcified stenosis (~50%) in the ostium of the vessel.  Other findings:  Aorta: Normal size.   Aortic atherosclerosis.  No dissection.  Main Pulmonary Artery: Normal size of the pulmonary artery.  Systemic Veins: Normal drainage  Aortic Valve:  Tri-leaflet.  AV calcium score 2.  Mitral valve: No calcifications.  Normal variant pulmonary vein drainage into the left atrium; left common pulmonary vein.  Normal left atrial appendage without a thrombus.  Interatrial septum with no clear communications.  Left Ventricle: Normal size  Left Atrium: Normal size  Right Ventricle: Normal size  Right [facility]rium: Normal size  Pericardium: Normal thickness  Extra-cardiac findings: See attached radiology report for non-cardiac structures.  IMPRESSION: 1. Coronary calcium score of 416. This was 84th percentile for age, sex, and race matched control.  2. Normal coronary origin with Right dominance.  3. CAD-RADS 3. Moderate stenosis. Consider symptom-guided anti-ischemic pharmacotherapy as well as risk factor modification per guideline directed care. Additional analysis with CT FFR will be submitted.  RECOMMENDATIONS: RECOMMENDATIONS The proposed cut-off value of 1,651 AU yielded a 93 % sensitivity and 75 % specificity in grading AS severity in patients with classical low-flow, low-gradient AS. Proposed different cut-off values to define severe AS for men and women as 2,065 AU and 1,274 AU, respectively. The joint European and American recommendations for the assessment of AS consider the aortic valve calcium score as a continuum - a very high calcium score suggests severe AS and a low calcium score  suggests severe AS is unlikely.  Sunday Shams, et al. 2017 ESC/EACTS Guidelines for the management of valvular heart disease. Eur Heart J 2017;38:2739-91.  Coronary  artery calcium (CAC) score is a strong predictor of incident coronary heart disease (CHD) and provides predictive information beyond traditional risk factors. CAC scoring is reasonable to use in the decision to withhold, postpone, or initiate statin therapy in intermediate-risk or selected borderline-risk asymptomatic adults (age 6-75 years and LDL-C >=70 to <190 mg/dL) who do not have diabetes or established atherosclerotic cardiovascular disease (ASCVD).* In intermediate-risk (10-year ASCVD risk >=7.5% to <20%) adults or selected borderline-risk (10-year ASCVD risk >=5% to <7.5%) adults in whom a CAC score is measured for the purpose of making a treatment decision the following recommendations have been made:  If CAC = 0, it is reasonable to withhold statin therapy and reassess in 5 to 10 years, as long as higher risk conditions are absent (diabetes mellitus, family history of premature CHD in first degree relatives (males <55 years; females <65 years), cigarette smoking, LDL >=190 mg/dL or other independent risk factors).  If CAC is 1 to 99, it is reasonable to initiate statin therapy for patients >=42 years of age.  If CAC is >=100 or >=75th percentile, it is reasonable to initiate statin therapy at any age.  Cardiology referral should be considered for patients with CAC scores =400 or >=75th percentile.  *2018 AHA/ACC/AACVPR/AAPA/ABC/ACPM/ADA/AGS/APhA/ASPC/NLA/PCNA Guideline on the Management of Blood Cholesterol: A Report of the American College of Cardiology/American Heart Association Task Force on Clinical Practice Guidelines. J Am Coll Cardiol. 2019;73(24):3168-3209.  Riley Lam, MD  Electronically Signed: By: Riley Lam M.D. On: 02/13/2023 17:31           Recent Labs: 11/04/2022: BUN 23; Hemoglobin 16.7; Magnesium 2.1; Platelets 222; Potassium 4.9; Sodium 141 05/12/2023: Creatinine, Ser 1.50   Lipid Panel    Component Value Date/Time   CHOL 153 02/26/2023 0856   TRIG 110 02/26/2023 0856   HDL 56 02/26/2023 0856   CHOLHDL 2.7 02/26/2023 0856   CHOLHDL 3 09/30/2012 1031   VLDL 16.6 09/30/2012 1031   LDLCALC 77 02/26/2023 0856   LDLDIRECT 147.8 03/20/2009 1521    Risk Assessment/Calculations:          Physical Exam:   VS:  BP 120/70   Pulse 72   Ht 5\' 8"  (1.727 m)   Wt 248 lb 6.4 oz (112.7 kg)   SpO2 97%   BMI 37.77 kg/m        Wt Readings from Last 3 Encounters:  07/30/23 248 lb 6.4 oz (112.7 kg)  05/16/23 246 lb 14.6 oz (112 kg)  03/31/23 250 lb 3.2 oz (113.5 kg)      GENERAL:  No apparent distress, AOx3 HEENT:  No carotid bruits, +2 carotid impulses, no scleral icterus CAR: RRR no murmurs, gallops, rubs, or thrills RES:  Clear to auscultation bilaterally ABD:  Soft, nontender, nondistended, positive bowel sounds x 4 VASC:  +2 radial pulses, +2 carotid pulses NEURO:  CN 2-12 grossly intact; motor and sensory grossly intact PSYCH:  No active depression or anxiety EXT:  No edema, ecchymosis, or cyanosis  Signed, Jose Pyo, MD  07/30/2023 11:41 AM    [facility] [address] [place], [place], [place]  [address] Phone: [phone]; Fax: [phone]   Note:  This document was prepared using Dragon voice recognition software and may include unintentional dictation errors.

## 2023-07-30 ENCOUNTER — Encounter: Payer: Self-pay | Admitting: Internal Medicine

## 2023-07-30 ENCOUNTER — Encounter: Payer: Self-pay | Admitting: *Deleted

## 2023-07-30 ENCOUNTER — Ambulatory Visit: Payer: No Typology Code available for payment source | Attending: Internal Medicine | Admitting: Internal Medicine

## 2023-07-30 VITALS — BP 120/70 | HR 72 | Ht 68.0 in | Wt 248.4 lb

## 2023-07-30 DIAGNOSIS — Z794 Long term (current) use of insulin: Secondary | ICD-10-CM

## 2023-07-30 DIAGNOSIS — I493 Ventricular premature depolarization: Secondary | ICD-10-CM

## 2023-07-30 DIAGNOSIS — N1832 Chronic kidney disease, stage 3b: Secondary | ICD-10-CM

## 2023-07-30 DIAGNOSIS — I700 Atherosclerosis of aorta: Secondary | ICD-10-CM

## 2023-07-30 DIAGNOSIS — E1169 Type 2 diabetes mellitus with other specified complication: Secondary | ICD-10-CM

## 2023-07-30 DIAGNOSIS — E118 Type 2 diabetes mellitus with unspecified complications: Secondary | ICD-10-CM

## 2023-07-30 DIAGNOSIS — E1159 Type 2 diabetes mellitus with other circulatory complications: Secondary | ICD-10-CM | POA: Diagnosis not present

## 2023-07-30 DIAGNOSIS — Z87891 Personal history of nicotine dependence: Secondary | ICD-10-CM

## 2023-07-30 DIAGNOSIS — C3490 Malignant neoplasm of unspecified part of unspecified bronchus or lung: Secondary | ICD-10-CM

## 2023-07-30 DIAGNOSIS — Z6838 Body mass index (BMI) 38.0-38.9, adult: Secondary | ICD-10-CM

## 2023-07-30 DIAGNOSIS — J449 Chronic obstructive pulmonary disease, unspecified: Secondary | ICD-10-CM

## 2023-07-30 DIAGNOSIS — I152 Hypertension secondary to endocrine disorders: Secondary | ICD-10-CM

## 2023-07-30 DIAGNOSIS — E785 Hyperlipidemia, unspecified: Secondary | ICD-10-CM

## 2023-07-30 NOTE — Patient Instructions (Signed)
Medication Instructions:  Your physician recommends that you continue on your current medications as directed. Please refer to the Current Medication list given to you today.  *If you need a refill on your cardiac medications before your next appointment, please call your pharmacy*   Lab Work: Today: lipids/liver/Lp(a)  If you have labs (blood work) drawn today and your tests are completely normal, you will receive your results only by: MyChart Message (if you have MyChart) OR A paper copy in the mail If you have any lab test that is abnormal or we need to change your treatment, we will call you to review the results.   Testing/Procedures: Your physician has requested that you have an echocardiogram. Echocardiography is a painless test that uses sound waves to create images of your heart. It provides your doctor with information about the size and shape of your heart and how well your heart's chambers and valves are working. This procedure takes approximately one hour. There are no restrictions for this procedure. Please do NOT wear cologne, perfume, aftershave, or lotions (deodorant is allowed). Please arrive 15 minutes prior to your appointment time.  Your physician has requested that you have an abdominal aorta duplex. During this test, an ultrasound is used to evaluate the aorta. Allow 30 minutes for this exam. Do not eat after midnight the day before and avoid carbonated beverages   Follow-Up: At [facility], you and your health needs are our priority.  As part of our continuing mission to provide you with exceptional heart care, we have created designated Provider Care Teams.  These Care Teams include your primary Cardiologist (physician) and Advanced Practice Providers (APPs -  Physician Assistants and Nurse Practitioners) who all work together to provide you with the care you need, when you need it.   Your next appointment:   6 month(s)  Provider:   Jari Favre, PA-C,  Ronie Spies, PA-C, Robin Searing, NP, Jacolyn Reedy, PA-C, Eligha Bridegroom, NP, Tereso Newcomer, PA-C, or Perlie Gold, PA-C

## 2023-07-31 LAB — HEPATIC FUNCTION PANEL
ALT: 37 IU/L (ref 0–44)
AST: 25 IU/L (ref 0–40)
Albumin: 4.5 g/dL (ref 3.8–4.8)
Alkaline Phosphatase: 86 IU/L (ref 44–121)
Bilirubin Total: 0.3 mg/dL (ref 0.0–1.2)
Bilirubin, Direct: <0.1 mg/dL (ref 0.00–0.40)
Total Protein: 7 g/dL (ref 6.0–8.5)

## 2023-07-31 LAB — LIPID PANEL
Chol/HDL Ratio: 3.4 ratio (ref 0.0–5.0)
Cholesterol, Total: 165 mg/dL (ref 100–199)
HDL: 48 mg/dL (ref >39)
LDL Chol Calc (NIH): 84 mg/dL (ref 0–99)
Triglycerides: 195 mg/dL — ABNORMAL HIGH (ref 0–149)
VLDL Cholesterol Cal: 33 mg/dL (ref 5–40)

## 2023-07-31 LAB — LIPOPROTEIN A (LPA): Lipoprotein (a): 102.2 nmol/L — ABNORMAL HIGH (ref <75.0)

## 2023-08-04 ENCOUNTER — Telehealth: Payer: Self-pay | Admitting: *Deleted

## 2023-08-04 DIAGNOSIS — E1169 Type 2 diabetes mellitus with other specified complication: Secondary | ICD-10-CM

## 2023-08-04 MED ORDER — ATORVASTATIN CALCIUM 40 MG PO TABS: 40.0000 mg | ORAL_TABLET | Freq: Every day | ORAL | 3 refills | Status: DC

## 2023-08-04 NOTE — Telephone Encounter (Signed)
-----   Message from Orbie Pyo sent at 07/31/2023  3:30 PM EDT ----- Joen Laura discontinue his pravastatin 40 and change him to atorvastatin 40; lipid panel and LFTs in 2 months.

## 2023-08-04 NOTE — Telephone Encounter (Signed)
Reviewed w the patient.  [facility] and let us know if we need to resend/send somewhere else.  He did not want me to mail it to him or to pick it up at office.  Appointment for repeat labs scheduled.

## 2023-08-05 ENCOUNTER — Other Ambulatory Visit: Payer: Self-pay | Admitting: Internal Medicine

## 2023-08-05 DIAGNOSIS — E1169 Type 2 diabetes mellitus with other specified complication: Secondary | ICD-10-CM

## 2023-08-05 DIAGNOSIS — I700 Atherosclerosis of aorta: Secondary | ICD-10-CM

## 2023-08-05 DIAGNOSIS — E118 Type 2 diabetes mellitus with unspecified complications: Secondary | ICD-10-CM

## 2023-08-05 DIAGNOSIS — J449 Chronic obstructive pulmonary disease, unspecified: Secondary | ICD-10-CM

## 2023-08-05 DIAGNOSIS — E1159 Type 2 diabetes mellitus with other circulatory complications: Secondary | ICD-10-CM

## 2023-08-05 DIAGNOSIS — Z6838 Body mass index (BMI) 38.0-38.9, adult: Secondary | ICD-10-CM

## 2023-08-05 DIAGNOSIS — C3490 Malignant neoplasm of unspecified part of unspecified bronchus or lung: Secondary | ICD-10-CM

## 2023-08-05 DIAGNOSIS — I152 Hypertension secondary to endocrine disorders: Secondary | ICD-10-CM

## 2023-08-05 DIAGNOSIS — N1832 Chronic kidney disease, stage 3b: Secondary | ICD-10-CM

## 2023-08-05 DIAGNOSIS — Z87891 Personal history of nicotine dependence: Secondary | ICD-10-CM

## 2023-08-05 DIAGNOSIS — I493 Ventricular premature depolarization: Secondary | ICD-10-CM

## 2023-08-08 ENCOUNTER — Telehealth: Payer: Self-pay | Admitting: Pulmonary Disease

## 2023-08-08 NOTE — Telephone Encounter (Signed)
Pt calling in albuterol (VENTOLIN HFA) 108 (90 Base) MCG/ACT inhaler   [facility]: [facility]

## 2023-08-12 ENCOUNTER — Ambulatory Visit (HOSPITAL_COMMUNITY)
Admission: RE | Admit: 2023-08-12 | Discharge: 2023-08-12 | Disposition: A | Discharge status: Home / Self Care | Payer: No Typology Code available for payment source | Source: Ambulatory Visit | Attending: Cardiovascular Disease | Admitting: Cardiovascular Disease

## 2023-08-12 ENCOUNTER — Encounter (HOSPITAL_COMMUNITY): Payer: Self-pay

## 2023-08-12 DIAGNOSIS — I700 Atherosclerosis of aorta: Secondary | ICD-10-CM | POA: Diagnosis present

## 2023-08-12 DIAGNOSIS — Z87891 Personal history of nicotine dependence: Secondary | ICD-10-CM

## 2023-08-12 DIAGNOSIS — Z136 Encounter for screening for cardiovascular disorders: Secondary | ICD-10-CM | POA: Diagnosis not present

## 2023-08-14 NOTE — Telephone Encounter (Signed)
Pt currently being seen by [facility] per JE last ov note. Patient has requested refill at [facility]. Nothing further needed.

## 2023-08-15 ENCOUNTER — Ambulatory Visit (HOSPITAL_COMMUNITY): Payer: No Typology Code available for payment source | Attending: Internal Medicine

## 2023-08-15 DIAGNOSIS — Z0189 Encounter for other specified special examinations: Secondary | ICD-10-CM | POA: Diagnosis not present

## 2023-08-15 DIAGNOSIS — C3490 Malignant neoplasm of unspecified part of unspecified bronchus or lung: Secondary | ICD-10-CM

## 2023-08-15 LAB — ECHOCARDIOGRAM COMPLETE
Area-P 1/2: 2.92 cm2
S' Lateral: 2.7 cm

## 2023-09-11 ENCOUNTER — Telehealth: Payer: Self-pay | Admitting: Internal Medicine

## 2023-09-11 NOTE — Telephone Encounter (Signed)
Patient states that he has been having severe fatigue for the past couple of weeks. He states that he has seen his PCP and his oncologist. They did some blood work and are going to start him on an iron pill. He reports that he does have shortness of breath but it is at his baseline. She denies any chest pain but states that he can feel his heart thumping at times. He does report some dizziness at times. He states blood sugar has been normal. Recent blood pressures have been 112/66 and 127/60. He states that he feels like symptoms started since switching his metoprolol. He reports taking his metoprolol in the morning along with other medications and then feels fatigued through out the day. I advised to try taking his metoprolol at night to see if that helps. Will make Dr. Lynnette Caffey aware for any further recommendations.

## 2023-09-11 NOTE — Telephone Encounter (Signed)
Pt states he needs to speak with a nurse about his fatigue. Please advise

## 2023-09-12 NOTE — Telephone Encounter (Signed)
Left a message to call back.

## 2023-09-12 NOTE — Telephone Encounter (Signed)
Advised pt of MD response: Yes agree with PM dosing   Advised to try PM dosing for a week or 2 if symptoms don't improve to give our office a call.  Pt had no further concerns at this time.

## 2023-09-17 ENCOUNTER — Telehealth: Payer: Self-pay | Admitting: Cardiology

## 2023-09-17 ENCOUNTER — Ambulatory Visit: Payer: Self-pay | Admitting: Cardiology

## 2023-10-06 ENCOUNTER — Ambulatory Visit: Payer: No Typology Code available for payment source | Attending: Internal Medicine

## 2023-10-06 DIAGNOSIS — E1169 Type 2 diabetes mellitus with other specified complication: Secondary | ICD-10-CM

## 2023-10-06 LAB — HEPATIC FUNCTION PANEL
ALT: 30 [IU]/L (ref 0–44)
AST: 24 [IU]/L (ref 0–40)
Albumin: 4.3 g/dL (ref 3.8–4.8)
Alkaline Phosphatase: 77 [IU]/L (ref 44–121)
Bilirubin Total: 0.3 mg/dL (ref 0.0–1.2)
Bilirubin, Direct: 0.15 mg/dL (ref 0.00–0.40)
Total Protein: 6.7 g/dL (ref 6.0–8.5)

## 2023-11-20 ENCOUNTER — Telehealth: Payer: Self-pay | Admitting: Pulmonary Disease

## 2023-11-20 NOTE — Telephone Encounter (Signed)
 Patient needs his approval from the [place] to continue having appointment with [place].

## 2023-11-21 NOTE — Telephone Encounter (Signed)
 Found [facility] referral. Auth sent 11/21/2023 for 12/08/2023 appointment

## 2023-12-01 NOTE — Telephone Encounter (Signed)
 Fredric Mare have you happened to receive anything from the [place] regarding this patient? Wanted to check with you before calling. Thanks. Auth sent 11/21/2023

## 2023-12-01 NOTE — Telephone Encounter (Signed)
 Patient is calling on a status of [facility] Auth. 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

## 2023-12-02 ENCOUNTER — Emergency Department (HOSPITAL_COMMUNITY)
Admission: EM | Admit: 2023-12-02 | Discharge: 2023-12-02 | Disposition: A | Discharge status: Home / Self Care | Payer: No Typology Code available for payment source | Attending: Emergency Medicine | Admitting: Emergency Medicine

## 2023-12-02 ENCOUNTER — Other Ambulatory Visit: Payer: Self-pay

## 2023-12-02 ENCOUNTER — Emergency Department (HOSPITAL_COMMUNITY): Payer: No Typology Code available for payment source

## 2023-12-02 ENCOUNTER — Encounter (HOSPITAL_COMMUNITY): Payer: Self-pay

## 2023-12-02 DIAGNOSIS — J449 Chronic obstructive pulmonary disease, unspecified: Secondary | ICD-10-CM | POA: Insufficient documentation

## 2023-12-02 DIAGNOSIS — K219 Gastro-esophageal reflux disease without esophagitis: Secondary | ICD-10-CM | POA: Diagnosis not present

## 2023-12-02 DIAGNOSIS — K5900 Constipation, unspecified: Secondary | ICD-10-CM | POA: Insufficient documentation

## 2023-12-02 DIAGNOSIS — R0789 Other chest pain: Secondary | ICD-10-CM

## 2023-12-02 DIAGNOSIS — Z85118 Personal history of other malignant neoplasm of bronchus and lung: Secondary | ICD-10-CM | POA: Diagnosis not present

## 2023-12-02 DIAGNOSIS — E119 Type 2 diabetes mellitus without complications: Secondary | ICD-10-CM | POA: Insufficient documentation

## 2023-12-02 DIAGNOSIS — Z7984 Long term (current) use of oral hypoglycemic drugs: Secondary | ICD-10-CM | POA: Insufficient documentation

## 2023-12-02 DIAGNOSIS — I10 Essential (primary) hypertension: Secondary | ICD-10-CM | POA: Insufficient documentation

## 2023-12-02 DIAGNOSIS — Z79899 Other long term (current) drug therapy: Secondary | ICD-10-CM | POA: Insufficient documentation

## 2023-12-02 DIAGNOSIS — Z794 Long term (current) use of insulin: Secondary | ICD-10-CM | POA: Diagnosis not present

## 2023-12-02 DIAGNOSIS — Z7982 Long term (current) use of aspirin: Secondary | ICD-10-CM | POA: Diagnosis not present

## 2023-12-02 DIAGNOSIS — Z7951 Long term (current) use of inhaled steroids: Secondary | ICD-10-CM | POA: Insufficient documentation

## 2023-12-02 DIAGNOSIS — R0602 Shortness of breath: Secondary | ICD-10-CM | POA: Diagnosis present

## 2023-12-02 DIAGNOSIS — R1084 Generalized abdominal pain: Secondary | ICD-10-CM

## 2023-12-02 LAB — I-STAT CHEM 8, ED
BUN: 14 mg/dL (ref 8–23)
Calcium, Ion: 1.27 mmol/L (ref 1.15–1.40)
Chloride: 99 mmol/L (ref 98–111)
Creatinine, Ser: 1.2 mg/dL (ref 0.61–1.24)
Glucose, Bld: 149 mg/dL — ABNORMAL HIGH (ref 70–99)
HCT: 55 % — ABNORMAL HIGH (ref 39.0–52.0)
Hemoglobin: 18.7 g/dL — ABNORMAL HIGH (ref 13.0–17.0)
Potassium: 4.3 mmol/L (ref 3.5–5.1)
Sodium: 136 mmol/L (ref 135–145)
TCO2: 25 mmol/L (ref 22–32)

## 2023-12-02 LAB — COMPREHENSIVE METABOLIC PANEL
ALT: 25 U/L (ref 0–44)
AST: 23 U/L (ref 15–41)
Albumin: 4 g/dL (ref 3.5–5.0)
Alkaline Phosphatase: 66 U/L (ref 38–126)
Anion gap: 12 (ref 5–15)
BUN: 14 mg/dL (ref 8–23)
CO2: 23 mmol/L (ref 22–32)
Calcium: 9.8 mg/dL (ref 8.9–10.3)
Chloride: 100 mmol/L (ref 98–111)
Creatinine, Ser: 1.11 mg/dL (ref 0.61–1.24)
GFR, Estimated: >60 mL/min (ref >60)
Glucose, Bld: 150 mg/dL — ABNORMAL HIGH (ref 70–99)
Potassium: 4.3 mmol/L (ref 3.5–5.1)
Sodium: 135 mmol/L (ref 135–145)
Total Bilirubin: 0.7 mg/dL (ref 0.0–1.2)
Total Protein: 7.6 g/dL (ref 6.5–8.1)

## 2023-12-02 LAB — CBC WITH DIFFERENTIAL/PLATELET
Abs Immature Granulocytes: 0.02 10*3/uL (ref 0.00–0.07)
Basophils Absolute: 0 10*3/uL (ref 0.0–0.1)
Basophils Relative: 0 %
Eosinophils Absolute: 0.1 10*3/uL (ref 0.0–0.5)
Eosinophils Relative: 1 %
HCT: 52.6 % — ABNORMAL HIGH (ref 39.0–52.0)
Hemoglobin: 16.5 g/dL (ref 13.0–17.0)
Immature Granulocytes: 0 %
Lymphocytes Relative: 19 %
Lymphs Abs: 1.3 10*3/uL (ref 0.7–4.0)
MCH: 27.6 pg (ref 26.0–34.0)
MCHC: 31.4 g/dL (ref 30.0–36.0)
MCV: 88.1 fL (ref 80.0–100.0)
Monocytes Absolute: 0.7 10*3/uL (ref 0.1–1.0)
Monocytes Relative: 11 %
Neutro Abs: 4.6 10*3/uL (ref 1.7–7.7)
Neutrophils Relative %: 69 %
Platelets: 217 10*3/uL (ref 150–400)
RBC: 5.97 MIL/uL — ABNORMAL HIGH (ref 4.22–5.81)
RDW: 13.5 % (ref 11.5–15.5)
WBC: 6.7 10*3/uL (ref 4.0–10.5)
nRBC: 0 % (ref 0.0–0.2)

## 2023-12-02 LAB — URINALYSIS, ROUTINE W REFLEX MICROSCOPIC
Bacteria, UA: NONE SEEN
Bilirubin Urine: NEGATIVE
Glucose, UA: >=500 mg/dL — AB
Hgb urine dipstick: NEGATIVE
Ketones, ur: NEGATIVE mg/dL
Leukocytes,Ua: NEGATIVE
Nitrite: NEGATIVE
Protein, ur: NEGATIVE mg/dL
Specific Gravity, Urine: 1.022 (ref 1.005–1.030)
pH: 5 (ref 5.0–8.0)

## 2023-12-02 LAB — TROPONIN I (HIGH SENSITIVITY)
Troponin I (High Sensitivity): 4 ng/L (ref <18)
Troponin I (High Sensitivity): 5 ng/L (ref <18)

## 2023-12-02 LAB — POC OCCULT BLOOD, ED: Fecal Occult Bld: NEGATIVE

## 2023-12-02 LAB — LIPASE, BLOOD: Lipase: 38 U/L (ref 11–51)

## 2023-12-02 LAB — BRAIN NATRIURETIC PEPTIDE: B Natriuretic Peptide: 38.4 pg/mL (ref 0.0–100.0)

## 2023-12-02 MED ORDER — SUCRALFATE 1 G PO TABS
1.0000 g | ORAL_TABLET | Freq: Three times a day (TID) | ORAL | 0 refills | Status: DC
Start: 2023-12-02 — End: 2024-04-07

## 2023-12-02 MED ORDER — IOHEXOL 350 MG/ML SOLN
100.0000 mL | Freq: Once | INTRAVENOUS | Status: AC | PRN
  Administered 2023-12-02: 100 mL via INTRAVENOUS

## 2023-12-02 MED ORDER — POLYETHYLENE GLYCOL 3350 17 GM/SCOOP PO POWD: 17.0000 g | Freq: Once | ORAL | 0 refills | Status: AC

## 2023-12-02 NOTE — Telephone Encounter (Signed)
 Spoke with Hailey from patients [facility]. Will expedite process and will email me the auth when finished.

## 2023-12-02 NOTE — ED Triage Notes (Signed)
 Patient has multiple complaints. Reports constipation and abdominal pain x 1 week. SOB and feels his throat is tight x 3 weeks. States he has severe acid reflux, states it feels similar to this. BP is 192/78 at time of triage. Patient takes BP medications and has been taking them.

## 2023-12-02 NOTE — ED Provider Notes (Signed)
 [place] [facility] DE[facility]MENT AT [facility] Provider Note   CSN: [id number] Arrival date & time: 12/02/23  1516     History  Chief Complaint  Patient presents with   Shortness of Breath   Abdominal Pain    Jose Patrick is a 73 y.o. male with hx lung cancer s/p left upper lobe lobectomy in 2013, COPD with emphysema, OSA nonadherent to CPAP, DM, HTN, reflux  who presents with multiple complaints inc SOB, abd pain.  He reports has had 3 weeks of worsening abdominal pain, shortness of breath, feeling in his throat like his food is coming back up or like severe acid reflux. He feels like his breathing treatments for COPD are not helping this. Reports he had some chest pain when initially started however has not had any recently. He is also reporting abdominal distention with some nausea but no vomiting. Reports that he has not had a bowel movement in over a week but is passing gas infrequently. A couple weeks ago he had several days of very malodorous black stools. Denies any urinary issues. Does have h/o DVT in BL LEs, doesn't take blood thinner. He was sent over here from [place] urgent care. He also recently had PET scan for surveillance w/ h/o lung cancer on 11/25/22 which noted thickening of distal esophagus concerning for early malignancy.   Past Medical History:  Diagnosis Date   ALLERGIC RHINITIS 10/08/2007   Qualifier: Diagnosis of  By: Norleen MD, Lynwood ORN    BENIGN PROSTATIC HYPERTROPHY 09/28/2010   Qualifier: Diagnosis of  By: Norleen MD, Lynwood ORN    Bipolar affective disorder (HCC) 09/30/2012   COLONIC POLYPS, HX OF 10/08/2007   Qualifier: Diagnosis of  By: Norleen MD, Lynwood ORN    Complication of anesthesia    sleeps awhile afterwards per patient   CONSTIPATION, CHRONIC, HX OF 08/10/2007   Qualifier: Diagnosis of  By: Helga Almarie Caldron    DIABETES MELLITUS, TYPE II 10/08/2007   Qualifier: Diagnosis of  By: Norleen MD, Lynwood ORN    ERECTILE DYSFUNCTION 10/08/2007    Qualifier: Diagnosis of  By: Norleen MD, Lynwood ORN    HYPERLIPIDEMIA 10/08/2007   Qualifier: Diagnosis of  By: Norleen MD, Lynwood ORN    HYPERTENSION 03/20/2009   Qualifier: Diagnosis of  By: Norleen MD, Lynwood ORN    PTSD (post-traumatic stress disorder) 09/30/2012   SPINAL STENOSIS, LUMBAR 10/08/2007   Per Dr Mavis - [facility]   Qualifier: Diagnosis of  By: Norleen MD, Lynwood ORN         Home Medications Prior to Admission medications   Medication Sig Start Date End Date Taking? Authorizing Provider  albuterol  (VENTOLIN  HFA) 108 (90 Base) MCG/ACT inhaler Inhale 2 puffs into the lungs every 6 (six) hours as needed for wheezing or shortness of breath. 03/19/22   Kassie Acquanetta Bradley, MD  amLODipine  (NORVASC ) 10 MG tablet Take 10 mg by mouth every evening. 11/23/21   [provider]  aspirin  81 MG chewable tablet Chew 1 tablet (81 mg total) by mouth daily. 12/19/11   Norleen Lynwood ORN, MD  atorvastatin  (LIPITOR) 40 MG tablet Take 1 tablet (40 mg total) by mouth daily. 08/04/23   Thukkani, Arun K, MD  Cholecalciferol  (VITAMIN D ) 2000 UNITS CAPS Take 1 capsule by mouth daily.    [provider]  famotidine  (PEPCID ) 20 MG tablet Take 1 tablet (20 mg total) by mouth at bedtime. 02/11/22 03/13/22  Jinny Carmine, MD  fluticasone -salmeterol (  WIXELA INHUB) 250-50 MCG/ACT AEPB ONE puff in the in morning and ONE puff in the evening 06/24/22   Kassie Acquanetta Bradley, MD  furosemide  (LASIX ) 20 MG tablet Take 20 mg by mouth as needed for edema.    [provider]  gabapentin  (NEURONTIN ) 300 MG capsule Take 300 mg by mouth 2 (two) times daily.    [provider]  guaiFENesin  (MUCINEX ) 600 MG 12 hr tablet Take 1 tablet (600 mg total) by mouth 2 (two) times daily as needed for to loosen phlegm or cough. 03/20/22   Kassie Acquanetta Bradley, MD  insulin  glargine (LANTUS ) 100 UNIT/ML injection 20 Units.    [provider]  JARDIANCE  10 MG TABS tablet Take 10 mg by mouth daily. 02/23/21   [provider]  losartan  (COZAAR ) 25 MG tablet Take 25 mg by mouth daily. 1/2 tab daily 01/01/23   [provider]  metFORMIN  (GLUCOPHAGE ) 1000 MG tablet Take 1,000 mg by mouth 2 (two) times daily. 09/14/21   [provider]  metoprolol  succinate (TOPROL -XL) 100 MG 24 hr tablet Take 100 mg by mouth daily. Take with or immediately following a meal.    [provider]  promethazine -dextromethorphan  (PROMETHAZINE -DM) 6.25-15 MG/5ML syrup Take 5 mLs by mouth 4 (four) times daily as needed for cough. 11/20/22   Kassie Acquanetta Bradley, MD  psyllium (METAMUCIL SMOOTH TEXTURE) 58.6 % powder Take 1 packet by mouth daily. 12/20/21   Jinny Carmine, MD  Pyridoxine  HCl (VITAMIN B6 PO) Take 1 tablet by mouth daily.     [provider]  ranolazine  (RANEXA ) 500 MG 12 hr tablet Take 1 tablet (500 mg total) by mouth 2 (two) times daily. 02/11/23   Hobart Powell BRAVO, MD  Semaglutide ,0.25 or 0.5MG /DOS, (OZEMPIC , 0.25 OR 0.5 MG/DOSE,) 2 MG/1.5ML SOPN Inject 0.5 mg into the skin once a week.    [provider]  sitaGLIPtin  (JANUVIA ) 100 MG tablet Take 100 mg by mouth daily.    [provider]  Testosterone 20.25 MG/ACT (1.62%) GEL testosterone 20.25 mg/1.25 gram per pump act.(1.62 %) transdermal gel 12/24/21   [provider]  Tiotropium Bromide  Monohydrate (SPIRIVA  RESPIMAT) 2.5 MCG/ACT AERS Inhale 2 puffs into the lungs daily. 11/20/22   Kassie Acquanetta Bradley, MD      Allergies    Chlorzoxazone    Review of Systems   Review of Systems A 10 point review of systems was performed and is negative unless otherwise reported in HPI.  Physical Exam Updated Vital Signs BP (!) 157/90 (BP Location: Left Arm)   Pulse 70   Temp 98 F (36.7 C) (Oral)   Resp 18   Ht 5' 8 (1.727 m)   Wt 112.7 kg   SpO2 96%   BMI 37.78 kg/m  Physical Exam General: Normal appearing male, lying in bed.  HEENT: NCAT. Sclera anicteric, MMM, trachea midline.  Cardiology: RRR, no  murmurs/rubs/gallops. BL radial and DP pulses equal bilaterally. No chest wall TTP. Resp: Normal respiratory rate and effort. CTAB, no wheezes, rhonchi, crackles.  Abd: Soft. Mild central abdominal tenderness to palpation with mild distension.. No rebound tenderness or guarding.  Rectal: Performed with nurse chaperone.  Normal-appearing external and sphincter with no anal fissures or hemorrhoids noted.  No abnormalities on palpation of the rectum with brown stool noted on glove.  No gross blood. MSK: No peripheral edema or signs of trauma. Skin: warm, dry. . Back: No CVA tenderness Neuro: A&Ox4, CNs II-XII grossly intact. MAEs. Sensation grossly intact.  Psych: Normal mood and affect.   ED Results / Procedures / Treatments   Labs (all labs ordered are listed, but only abnormal results are displayed) Labs Reviewed  COMPREHENSIVE METABOLIC PANEL - Abnormal; Notable for the following components:      Result Value   Glucose, Bld 150 (*)    All other components within normal limits  CBC WITH DIFFERENTIAL/PLATELET - Abnormal; Notable for the following components:   RBC 5.97 (*)    HCT 52.6 (*)    All other components within normal limits  URINALYSIS, ROUTINE W REFLEX MICROSCOPIC - Abnormal; Notable for the following components:   Glucose, UA >=500 (*)    All other components within normal limits  I-STAT CHEM 8, ED - Abnormal; Notable for the following components:   Glucose, Bld 149 (*)    Hemoglobin 18.7 (*)    HCT 55.0 (*)    All other components within normal limits  LIPASE, BLOOD  BRAIN NATRIURETIC PEPTIDE  TROPONIN I (HIGH SENSITIVITY)  TROPONIN I (HIGH SENSITIVITY)    EKG EKG Interpretation Date/Time:  Tuesday December 02 2023 15:43:50 EST Ventricular Rate:  72 PR Interval:  182 QRS Duration:  86 QT Interval:  393 QTC Calculation: 431 R Axis:   -23  Text Interpretation: Sinus rhythm Probable left atrial enlargement Borderline left axis deviation Confirmed by Franklyn Gills  [phone]) on 12/02/2023 5:36:50 PM  Radiology DG Chest 2 View Result Date: 12/02/2023 CLINICAL DATA:  Wheezing. EXAM: CHEST - 2 VIEW COMPARISON:  11/04/2022. FINDINGS: Bilateral lungs appear hyperexpanded and hyperlucent with coarse bronchovascular markings, in keeping with COPD. There are nonspecific heterogeneous opacities overlying the right lower lung zone which when correlated with previous chest CT scan from 10/29/2023, corresponds to part solid lung nodule and pneumonitis/atelectasis in the right lower lobe. There is new trace left pleural effusion with probable associated atelectatic changes at the left lung base. Bullous emphysematous changes noted just above the left lateral costophrenic angle. No right pleural effusion. Normal cardio-mediastinal silhouette. No acute osseous abnormalities. The soft tissues are within normal limits. IMPRESSION: 1. COPD. 2. Nonspecific heterogeneous opacities overlying the right lower lung zone, which likely corresponds to part solid lung nodule and pneumonitis/atelectasis in the right lower lobe. 3. New trace left pleural effusion with probable associated atelectatic changes at the left lung base. Electronically Signed   By: Ree Molt M.D.   On: 12/02/2023 16:59        Procedures Procedures    Medications Ordered in ED Medications  iohexol  (OMNIPAQUE ) 350 MG/ML injection 100 mL (100 mLs Intravenous Contrast Given 12/02/23 2134)    ED Course/ Medical Decision Making/ A&P                          Medical Decision Making Amount and/or Complexity of Data Reviewed Labs:  Decision-making details documented in ED Course. Radiology: ordered. Decision-making details documented in ED Course.  Risk Prescription drug management.    This patient presents to the ED for concern of multiple complaints, this involves an extensive number of treatment options, and is a complaint that carries with it a high risk of complications and morbidity.  I considered  the following differential and admission for this acute, potentially life threatening condition. Pt overall is well-appearing, HDS albeit mildly hypertensive intermittently throughout ED stay, and in no acute distress.  MDM:    For DDX for abdominal pain includes but is not limited to:  Abdominal exam without peritoneal signs.  No evidence of acute abdomen at this time. Greatest concern for bowel obstruction or ileus given lack of bowel movements and reported constipation. Also consider chronic/acute mesenteric ischemia w/ report of black stools. He does report sensation of acid reflux or like his food is coming back up and does have h/o hiatal hernia so likely does have reflux component, takes omeprazole  chronically at home which seems to not be helping.  Low suspicion for acute hepatobiliary disease (including acute cholecystitis or cholangitis), acute pancreatitis (neg lipase), PUD (including gastric perforation), acute appendicitis, or diverticulitis.   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 No leg swelling to indicate DVTs or CHF exacerbation/volume overload. No fever/chills to indicate PNA. He has no wheezing on exam today to indicate COPD exacerbation. EKG w/o signs of ischemia or arrhythmia and troponin is reassuringly negative, doubt ACS/angina.    Clinical Course as of 12/07/23 1943  Tue Dec 02, 2023  1734 DG Chest 2 View 1. COPD. 2. Nonspecific heterogeneous opacities overlying the right lower lung zone, which likely corresponds to part solid lung nodule and pneumonitis/atelectasis in the right lower lobe. 3. New trace left pleural effusion with probable associated atelectatic changes at the left lung base.   [HN]  1910 Urinalysis, Routine w reflex microscopic -Urine, Clean Catch(!) Neg, no UTI [HN]  1937 Troponin I (High Sensitivity): 4 neg [HN]  1938  B Natriuretic Peptide: 38.4 Wnl [HN]  2013 Patient did not have IV access for the last 5 hours. I was unaware. D/w nurse who is obtaining access now. Ct has not yet been performed. [HN]  2131 Troponin I (High Sensitivity): 5 Repeat trop neg [HN]  2220 CT ABDOMEN PELVIS W CONTRAST No acute abnormality in the abdomen/pelvis. No bowel obstruction.  Aortic Atherosclerosis (ICD10-I70.0).   [HN]  2220 CT Angio Chest PE W and/or Wo Contrast 1. No pulmonary embolus. 2. The right lower lobe nodule is difficult to define on the current exam due to motion in this area. This is not significantly changed from recent PET. There is adjacent ground-glass density in the right lower lobe that is likely post treatment related change. Recommend attention at follow-up. 3. Chronic cardiomegaly with coronary artery calcifications. 4. Emphysema. Bronchial thickening may have increased from prior imaging. 5. Small hiatal hernia with patulous esophagus, query reflux.   [HN]  2303 Fecal Occult Blood, POC: NEGATIVE Negative for blood. [HN]    Clinical Course User Index [HN] Franklyn Sid SAILOR, MD    Overall patient's workup is reassuring against an emergent cause of his symptoms.  He does have chronic changes post lung malignancy in his right lung and there is no respiratory infection noted.  No laboratory evidence of heart failure or ACS.  No UTI.  Fecal occult is reassuringly negative for blood.  Patient is feeling okay.  Discussed reflux and hiatal hernia with him and will add Carafate  to his regimen. Discussed reflux lifestyle changes including diet.  Advised him to follow-up with his GI physician in clinic about his recurrent reflux symptoms in addition to his possible early esophageal malignancy. He is also constipated and only taking metamucil at home, added miralax  1-2 capfuls per day and instructed him to stay well hydrated. Patient reports understanding. For his chest pain/dyspnea, patient has moderate  heart score d/t age/risk factors. Discussed with patient about close follow-up outpatient with his cardiologist and patient reports  he will call him tomorrow to schedule appointment.  Chest pain could also be due to his reflux or hiatal hernia as patient has also described.  Considered admission for this patient but believe he is stable for discharge. DC w/ discharge instructions/return precautions. All questions answered to patient's satisfaction.     Labs: I Ordered, and personally interpreted labs.  The pertinent results include:  those listed above  Imaging Studies ordered: I ordered imaging studies including CT C/A/P I independently visualized and interpreted imaging. I agree with the radiologist interpretation  Additional history obtained from chart review.  External records from outside source obtained and reviewed including paperwork at bedside.   Cardiac Monitoring: The patient was maintained on a cardiac monitor.  I personally viewed and interpreted the cardiac monitored which showed an underlying rhythm of: NSR  Reevaluation: After the interventions noted above, I reevaluated the patient and found that they have :stayed the same  Social Determinants of Health: Lives independently  Disposition:  DC, instructed to f/u with GI and cardiologist at [facility]  Co morbidities that complicate the patient evaluation  Past Medical History:  Diagnosis Date   ALLERGIC RHINITIS 10/08/2007   Qualifier: Diagnosis of  By: Norleen MD, Lynwood ORN    BENIGN PROSTATIC HYPERTROPHY 09/28/2010   Qualifier: Diagnosis of  By: Norleen MD, Lynwood ORN    Bipolar affective disorder (HCC) 09/30/2012   COLONIC POLYPS, HX OF 10/08/2007   Qualifier: Diagnosis of  By: Norleen MD, Lynwood ORN    Complication of anesthesia    sleeps awhile afterwards per patient   CONSTIPATION, CHRONIC, HX OF 08/10/2007   Qualifier: Diagnosis of  By: Helga Almarie Caldron    DIABETES MELLITUS, TYPE II 10/08/2007   Qualifier: Diagnosis of  By:  Norleen MD, Lynwood ORN    ERECTILE DYSFUNCTION 10/08/2007   Qualifier: Diagnosis of  By: Norleen MD, Lynwood ORN    HYPERLIPIDEMIA 10/08/2007   Qualifier: Diagnosis of  By: Norleen MD, Lynwood ORN    HYPERTENSION 03/20/2009   Qualifier: Diagnosis of  By: Norleen MD, Lynwood ORN    PTSD (post-traumatic stress disorder) 09/30/2012   SPINAL STENOSIS, LUMBAR 10/08/2007   Per Dr Mavis - [facility]   Qualifier: Diagnosis of  By: Norleen MD, Lynwood ORN       Medicines No orders of the defined types were placed in this encounter.   I have reviewed the patients home medicines and have made adjustments as needed  Problem List / ED Course: Problem List Items Addressed This Visit       Digestive   Gastroesophageal reflux disease   Relevant Medications   sucralfate  (CARAFATE ) 1 g tablet     Other   Dyspnea, unspecified   Other Visit Diagnoses       Generalized abdominal pain    -  Primary     Atypical chest pain         Constipation, unspecified constipation type                       This note was created using dictation software, which may contain spelling or grammatical errors.    Franklyn Sid SAILOR, MD 12/08/23 [phone]

## 2023-12-02 NOTE — ED Provider Triage Note (Signed)
 Emergency Medicine Provider Triage Evaluation Note  Jose Patrick , a 73 y.o. male  was evaluated in triage.  Pt complains of multiple complaints.  He reports has had 3 weeks of worsening shortness of breath.  He feels like his breathing treatments are not working.  Reports he had some chest pain when initially started however has not had any.  He is also reporting abdominal distention with some nausea but no vomiting.  Reports that he has not had a bowel movement in over a week but is passing gas infrequently.  Denies any urinary issues.  He was sent over here from [place] urgent care.  Review of Systems  Positive:  Negative:   Physical Exam  BP (!) 192/78   Pulse 76   Temp 99.2 F (37.3 C) (Oral)   Resp 18   Ht 5' 8 (1.727 m)   Wt 112.7 kg   SpO2 98%   BMI 37.78 kg/m  Gen:   Awake, no distress   Resp:  Normal effort  MSK:   Moves extremities without difficulty  Other:  Abdominal distention with diffuse tenderness.  Speaking in full sentences.  Does have some expiratory wheezing, 98% on room air.  Does not appear in any acute distress.  Medical Decision Making  Medically screening exam initiated at 4:02 PM.  Appropriate orders placed.  [name] was informed that the remainder of the evaluation will be completed by another provider, this initial triage assessment does not replace that evaluation, and the importance of remaining in the ED until their evaluation is complete.  CT scan and lab ordered.   Bernis Ernst, [place] 12/02/23 [id number]

## 2023-12-02 NOTE — Discharge Instructions (Addendum)
 Thank you for coming to [facility] Emergency Department. You were seen for shortness of breath, chest discomfort, reflux, black stool, and constipation with abdominal distension/pain. We did an exam, labs, and imaging, and these showed no acute findings. Your stool tested negative for blood. Please continue to take your omeprazole  for your reflux and avoid alcohol , caffeine, chocolate, or fatty foods. Please sleep with the head slightly elevated. Please do a trial of carafate  three times per day for reflux as well. You can also take miralax  1-2 scoops per day for constipation.   Please follow up with your gastroenterologist as scheduled for this week.  Please also make an appointment to follow-up with your cardiologist within 1 to 2 weeks.  Do not hesitate to return to the ED or call 911 if you experience: -Worsening symptoms -Inability to have a bowel movement -Bright red blood in your stool or coffee grounds appearance of stool -Lightheadedness, passing out -Fevers/chills -Anything else that concerns you

## 2023-12-03 ENCOUNTER — Telehealth: Payer: Self-pay | Admitting: Internal Medicine

## 2023-12-03 NOTE — Telephone Encounter (Signed)
 The patient was concerned that the [place] did not know that he is not on diltiazem.  It is not on his current med list.  He confirmed with his other doctors that he is not on it.  He said it is just the [place] that did not have an up to date list, which is his PCP.  No further questions or concerns but was appreciative for information provided.

## 2023-12-03 NOTE — Telephone Encounter (Signed)
 Pt c/o medication issue:  1. Name of Medication: ranolazine  (RANEXA ) 500 MG 12 hr tablet ;   2. How are you currently taking this medication (dosage and times per day)? Take 1 tablet (500 mg total) by mouth 2 (two) times daily.   3. Are you having a reaction (difficulty breathing--STAT)? No   4. What is your medication issue? Patient wants to know if it is safe to take Renexa with other medication prescribed by ER doctor

## 2023-12-03 NOTE — Telephone Encounter (Signed)
 Pt calling saying we need to get [facility] with [facility] before one of us  kill him. Pt would like a call back

## 2023-12-03 NOTE — Telephone Encounter (Signed)
 Left voicemail to return call to office

## 2023-12-08 ENCOUNTER — Ambulatory Visit (HOSPITAL_BASED_OUTPATIENT_CLINIC_OR_DEPARTMENT_OTHER): Payer: No Typology Code available for payment source | Admitting: Pulmonary Disease

## 2023-12-12 NOTE — Telephone Encounter (Signed)
PT is calling back saying the [place] apologizes and he wants to resched. Please call because I am not sure if you want to work him in w/Dr. Everardo All or another Dr. Since she will be on maternity leave. TY. His # is [phone]

## 2023-12-15 NOTE — Telephone Encounter (Signed)
Spoke with patient he has been reschedule to 01/14/24 will contact the [facility] again regarding patients auth

## 2023-12-19 ENCOUNTER — Ambulatory Visit: Payer: No Typology Code available for payment source | Attending: Cardiology | Admitting: Cardiology

## 2023-12-19 ENCOUNTER — Encounter: Payer: Self-pay | Admitting: Cardiology

## 2023-12-19 VITALS — BP 128/68 | HR 62 | Ht 68.0 in | Wt 227.2 lb

## 2023-12-19 DIAGNOSIS — N1831 Chronic kidney disease, stage 3a: Secondary | ICD-10-CM

## 2023-12-19 DIAGNOSIS — R5383 Other fatigue: Secondary | ICD-10-CM

## 2023-12-19 DIAGNOSIS — I10 Essential (primary) hypertension: Secondary | ICD-10-CM | POA: Insufficient documentation

## 2023-12-19 DIAGNOSIS — E785 Hyperlipidemia, unspecified: Secondary | ICD-10-CM

## 2023-12-19 DIAGNOSIS — I493 Ventricular premature depolarization: Secondary | ICD-10-CM

## 2023-12-19 NOTE — Telephone Encounter (Signed)
Per [facility] nurse: "Jose Patrick has two [facility] auths and I believe there was a little confusion.  He requested to return to [facility] for his COPD, but he wants to keep his care for the Pulmonary Nodule at [facility]. I have submitted the request to his PCP will await response."

## 2023-12-19 NOTE — Patient Instructions (Signed)
Medication Instructions:  Your physician recommends that you continue on your current medications as directed. Please refer to the Current Medication list given to you today.  *If you need a refill on your cardiac medications before your next appointment, please call your pharmacy*   Lab Work: Lipids, TSH, Vit D If you have labs (blood work) drawn today and your tests are completely normal, you will receive your results only by: MyChart Message (if you have MyChart) OR A paper copy in the mail If you have any lab test that is abnormal or we need to change your treatment, we will call you to review the results.   Follow-Up: At [facility], you and your health needs are our priority.  As part of our continuing mission to provide you with exceptional heart care, we have created designated Provider Care Teams.  These Care Teams include your primary Cardiologist (physician) and Advanced Practice Providers (APPs -  Physician Assistants and Nurse Practitioners) who all work together to provide you with the care you need, when you need it.  Your next appointment:   6 month(s)  Provider:   Thomasene Ripple, DO

## 2023-12-20 LAB — LIPID PANEL
Chol/HDL Ratio: 2.7 {ratio} (ref 0.0–5.0)
Cholesterol, Total: 108 mg/dL (ref 100–199)
HDL: 40 mg/dL (ref >39)
LDL Chol Calc (NIH): 52 mg/dL (ref 0–99)
Triglycerides: 77 mg/dL (ref 0–149)
VLDL Cholesterol Cal: 16 mg/dL (ref 5–40)

## 2023-12-20 LAB — TSH+T4F+T3FREE
Free T4: 1.11 ng/dL (ref 0.82–1.77)
T3, Free: 3 pg/mL (ref 2.0–4.4)
TSH: 0.628 u[IU]/mL (ref 0.450–4.500)

## 2023-12-20 LAB — VITAMIN D 25 HYDROXY (VIT D DEFICIENCY, FRACTURES): Vit D, 25-Hydroxy: 48.9 ng/mL (ref 30.0–100.0)

## 2023-12-20 NOTE — Progress Notes (Signed)
Cardiology Office Note:    Date:  12/20/2023   ID:  Jose Patrick, DOB 1951/01/29, MRN [id number]  PCP:  [facility]  Cardiologist:  Orbie Pyo, MD  Electrophysiologist:  None   Referring MD: [facility]    History of Present Illness:    Jose Patrick is a 73 y.o. male with a hx of frequent PVCs with ablation, type 2 diabetes, hypertension associated with diabetes, hyperlipidemia, stage III kidney disease, COPD, morbid obesity, aortic atherosclerosis.  He follows with Dr. Lynnette Caffey and was last seen by him September 2024 at that time he appears to be stable from a cardiovascular standpoint.  In the interim he had a PVC ablation at [facility] and has also been following there.   He describes a tightening sensation in the chest, which he initially attributed to gas but is now unsure of the cause. The patient also mentions swelling in the legs, for which he has been prescribed medication to take as needed. The patient's personal life is also causing him stress, with strained family relationships and concerns about his health impacting his mental wellbeing.  Past Medical History:  Diagnosis Date   ALLERGIC RHINITIS 10/08/2007   Qualifier: Diagnosis of  By: Jonny Ruiz MD, Len Blalock    BENIGN PROSTATIC HYPERTROPHY 09/28/2010   Qualifier: Diagnosis of  By: Jonny Ruiz MD, Len Blalock    Bipolar affective disorder (HCC) 09/30/2012   COLONIC POLYPS, HX OF 10/08/2007   Qualifier: Diagnosis of  By: Jonny Ruiz MD, Len Blalock    Complication of anesthesia    sleeps awhile afterwards per patient   CONSTIPATION, CHRONIC, HX OF 08/10/2007   Qualifier: Diagnosis of  By: Maris Berger    DIABETES MELLITUS, TYPE II 10/08/2007   Qualifier: Diagnosis of  By: Jonny Ruiz MD, Len Blalock    ERECTILE DYSFUNCTION 10/08/2007   Qualifier: Diagnosis of  By: Jonny Ruiz MD, Len Blalock    HYPERLIPIDEMIA 10/08/2007   Qualifier: Diagnosis of  By: Jonny Ruiz MD, Len Blalock    HYPERTENSION 03/20/2009   Qualifier:  Diagnosis of  By: Jonny Ruiz MD, Len Blalock    PTSD (post-traumatic stress disorder) 09/30/2012   SPINAL STENOSIS, LUMBAR 10/08/2007   Per Dr Lovell Sheehan - [facility]   Qualifier: Diagnosis of  By: Jonny Ruiz MD, Len Blalock      Past Surgical History:  Procedure Laterality Date   COLONOSCOPY WITH PROPOFOL N/A 04/09/2021   Procedure: COLONOSCOPY WITH PROPOFOL;  Surgeon: Pasty Spillers, MD;  Location: [facility] ENDOSCOPY;  Service: Endoscopy;  Laterality: N/A;   cyst off right buttock     ESOPHAGOGASTRODUODENOSCOPY (EGD) WITH PROPOFOL N/A 04/09/2021   Procedure: ESOPHAGOGASTRODUODENOSCOPY (EGD) WITH PROPOFOL;  Surgeon: Pasty Spillers, MD;  Location: [facility] ENDOSCOPY;  Service: Endoscopy;  Laterality: N/A;   PREPATELLAR BURSA EXCISION     removal right   s/p bilat hydrocelectomy  2008   x 2   TONSILLECTOMY      Current Medications: Current Meds  Medication Sig   albuterol (VENTOLIN HFA) 108 (90 Base) MCG/ACT inhaler Inhale 2 puffs into the lungs every 6 (six) hours as needed for wheezing or shortness of breath.   amLODipine (NORVASC) 10 MG tablet Take 10 mg by mouth every evening.   aspirin 81 MG chewable tablet Chew 1 tablet (81 mg total) by mouth daily.   atorvastatin (LIPITOR) 40 MG tablet Take 1 tablet (40 mg total) by mouth daily.   Cholecalciferol (VITAMIN D) 2000 UNITS CAPS Take 1 capsule by  mouth daily.   fluticasone-salmeterol (WIXELA INHUB) 250-50 MCG/ACT AEPB ONE puff in the in morning and ONE puff in the evening   furosemide (LASIX) 20 MG tablet Take 20 mg by mouth as needed for edema.   gabapentin (NEURONTIN) 300 MG capsule Take 300 mg by mouth 2 (two) times daily.   guaiFENesin (MUCINEX) 600 MG 12 hr tablet Take 1 tablet (600 mg total) by mouth 2 (two) times daily as needed for to loosen phlegm or cough.   insulin glargine (LANTUS) 100 UNIT/ML injection 20 Units.   JARDIANCE 10 MG TABS tablet Take 10 mg by mouth daily.   losartan (COZAAR) 25 MG tablet Take 25 mg by mouth  daily. 1/2 tab daily   metFORMIN (GLUCOPHAGE) 1000 MG tablet Take 1,000 mg by mouth 2 (two) times daily.   metoprolol succinate (TOPROL-XL) 100 MG 24 hr tablet Take 100 mg by mouth daily. Take with or immediately following a meal.   promethazine-dextromethorphan (PROMETHAZINE-DM) 6.25-15 MG/5ML syrup Take 5 mLs by mouth 4 (four) times daily as needed for cough.   psyllium (METAMUCIL SMOOTH TEXTURE) 58.6 % powder Take 1 packet by mouth daily.   Pyridoxine HCl (VITAMIN B6 PO) Take 1 tablet by mouth daily.    ranolazine (RANEXA) 500 MG 12 hr tablet Take 1 tablet (500 mg total) by mouth 2 (two) times daily.   Semaglutide,0.25 or 0.5MG /DOS, (OZEMPIC, 0.25 OR 0.5 MG/DOSE,) 2 MG/1.5ML SOPN Inject 0.5 mg into the skin once a week.   sitaGLIPtin (JANUVIA) 100 MG tablet Take 100 mg by mouth daily.   sucralfate (CARAFATE) 1 g tablet Take 1 tablet (1 g total) by mouth with breakfast, with lunch, and with evening meal.   Testosterone 20.25 MG/ACT (1.62%) GEL testosterone 20.25 mg/1.25 gram per pump act.(1.62 %) transdermal gel   Tiotropium Bromide Monohydrate (SPIRIVA RESPIMAT) 2.5 MCG/ACT AERS Inhale 2 puffs into the lungs daily.     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  Tobacco Use   Smoking status: Former    Current packs/day: 0.00    Average packs/day: 1.5 packs/day for 41.0 years (61.5 ttl pk-yrs)    Types: Cigarettes    Start date: 55    Quit date: 2013    Years since quitting: 12.0   Smokeless tobacco: Never  Vaping Use   Vaping status: Never Used  Substance and Sexual Activity   Alcohol use: Yes   Drug use: No   Sexual activity: Not on file  Other Topics Concern   Not  on file  Social History Narrative   Not on file   Social Drivers of Health   Financial Resource Strain: Low Risk  (12/02/2023)   Received from [facility]   Overall Financial Resource Strain (CARDIA)    Difficulty of Paying Living Expenses: Not hard at all  Food Insecurity: No Food Insecurity (12/02/2023)   Received from [facility]   Hunger Vital Sign    Worried About Running Out of Food in the Last Year: Never true    Ran Out of Food in the Last Year: Never true  Transportation Needs: No Transportation Needs (12/02/2023)  Received from [facility] - Transportation    Lack of Transportation (Medical): No    Lack of Transportation (Non-Medical): No  Physical Activity: Patient Declined (05/06/2023)   Received from [facility], [facility]   Exercise Vital Sign    Days of Exercise per Week: Patient declined    Minutes of Exercise per Session: Patient declined  Stress: Patient Declined (05/06/2023)   Received from [facility] - Occupational Stress Questionnaire    Feeling of Stress : Patient declined  Social Connections: Socially Isolated (05/06/2023)   Received from [facility], [facility]   Social Network    How would you rate your social network (family, work, friends)?: Little participation, lonely and socially isolated     Family History: The patient's family history includes Allergies in his sister; Anxiety disorder in his mother; Cancer in his mother; Depression in his mother; Ulcers in his mother.  ROS:   Review of Systems  Constitution: Negative for decreased appetite, fever and weight gain.  HENT: Negative for congestion, ear discharge, hoarse voice and sore throat.   Eyes: Negative for discharge, redness, vision loss in right eye and visual halos.  Cardiovascular: Negative for chest pain, dyspnea on exertion, leg swelling, orthopnea and palpitations.  Respiratory: Negative for cough,  hemoptysis, shortness of breath and snoring.   Endocrine: Negative for heat intolerance and polyphagia.  Hematologic/Lymphatic: Negative for bleeding problem. Does not bruise/bleed easily.  Skin: Negative for flushing, nail changes, rash and suspicious lesions.  Musculoskeletal: Negative for arthritis, joint pain, muscle cramps, myalgias, neck pain and stiffness.  Gastrointestinal: Negative for abdominal pain, bowel incontinence, diarrhea and excessive appetite.  Genitourinary: Negative for decreased libido, genital sores and incomplete emptying.  Neurological: Negative for brief paralysis, focal weakness, headaches and loss of balance.  Psychiatric/Behavioral: Negative for altered mental status, depression and suicidal ideas.  Allergic/Immunologic: Negative for HIV exposure and persistent infections.    EKGs/Labs/Other Studies Reviewed:    The following studies were reviewed today:   EKG:  The ekg ordered today demonstrates   Recent Labs: 12/02/2023: ALT 25; B Natriuretic Peptide 38.4; BUN 14; Creatinine, Ser 1.20; Hemoglobin 18.7; Platelets 217; Potassium 4.3; Sodium 136 12/19/2023: TSH 0.628  Recent Lipid Panel    Component Value Date/Time   CHOL 108 12/19/2023 1456   TRIG 77 12/19/2023 1456   HDL 40 12/19/2023 1456   CHOLHDL 2.7 12/19/2023 1456   CHOLHDL 3 09/30/2012 1031   VLDL 16.6 09/30/2012 1031   LDLCALC 52 12/19/2023 1456   LDLDIRECT 147.8 03/20/2009 1521    Physical Exam:    VS:  BP 128/68 (BP Location: Right Arm, Patient Position: Sitting, Cuff Size: Normal)   Pulse 62   Ht 5\' 8"  (1.727 m)   Wt 227 lb 3.2 oz (103.1 kg)   SpO2 98%   BMI 34.55 kg/m     Wt Readings from Last 3 Encounters:  12/19/23 227 lb 3.2 oz (103.1 kg)  12/02/23 248 lb 7.3 oz (112.7 kg)  07/30/23 248 lb 6.4 oz (112.7 kg)     GEN: Well nourished, well developed in no acute distress HEENT: Normal NECK: No JVD; No carotid bruits LYMPHATICS: No lymphadenopathy CARDIAC: S1S2 noted,RRR, no  murmurs, rubs, gallops RESPIRATORY:  Clear to auscultation without rales, wheezing or rhonchi  ABDOMEN: Soft, non-tender, non-distended, +bowel sounds, no guarding. EXTREMITIES: No edema, No cyanosis, no clubbing MUSCULOSKELETAL:  No deformity  SKIN: Warm and dry NEUROLOGIC:  Alert  and oriented x 3, non-focal PSYCHIATRIC:  Normal affect, good insight  ASSESSMENT:    1. Fatigue, unspecified type   2. Hyperlipidemia, unspecified hyperlipidemia type   3. CKD stage 3a, GFR 45-59 ml/min (HCC)   4. Morbid obesity (HCC)   5. Essential hypertension   6. 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 Biopsy scheduled for February 24th at the [facility]. -Continue follow-up with oncology at [facility].  Polypharmacy Reports dizziness and fatigue after taking medications. Expressed desire to reduce medication load if possible. -Reviewed each medication and his importance for heart health and kidney protection. No changes made at this time, he now has understanding that he is not on any unnecessary medications.   Hypertension Blood pressure controlled on current regimen of Amlodipine and Losartan. -Continue current regimen.  Hyperlipidemia Last LDL was 84 in September 2024, goal is less than 55 due to diabetes and other risk factors. -Repeat lipid panel today.  Premature Ventricular Contractions (PVCs) Symptomatic PVCs being managed with Metoprolol. -Continue Metoprolol. Had ablation at [facility] in 2023.  Chronic Kidney Disease (CKD) Stage 3 On Losartan for kidney protection due to diabetes. -Continue Losartan.  Chest Pain On Ranexa for chest pain likely due to small vessel disease. -Continue Ranexa.  Peripheral Edema Reports occasional leg swelling. Has medication to take as needed. -Continue current management.  Gastroesophageal Reflux Disease (GERD) Reports chest tightness possibly due  to acid reflux. Currently on treatment. -Continue current treatment.  General Health Maintenance -Encouraged to continue exercise as tolerated. -Repeat echocardiogram due to history of pulmonary regurgitation.  The patient is in agreement with the above plan. The patient left the office in stable condition.  The patient will follow up in   Medication Adjustments/Labs and Tests Ordered: Current medicines are reviewed at length with the patient today.  Concerns regarding medicines are outlined above.  Orders Placed This Encounter  Procedures   Lipid panel   TSH+T4F+T3Free   VITAMIN D 25 Hydroxy (Vit-D Deficiency, Fractures)   No orders of the defined types were placed in this encounter.   Patient Instructions  Medication Instructions:  Your physician recommends that you continue on your current medications as directed. Please refer to the Current Medication list given to you today.  *If you need a refill on your cardiac medications before your next appointment, please call your pharmacy*   Lab Work: Lipids, TSH, Vit D If you have labs (blood work) drawn today and your tests are completely normal, you will receive your results only by: MyChart Message (if you have MyChart) OR A paper copy in the mail If you have any lab test that is abnormal or we need to change your treatment, we will call you to review the results.   Follow-Up: At [facility], you and your health needs are our priority.  As part of our continuing mission to provide you with exceptional heart care, we have created designated Provider Care Teams.  These Care Teams include your primary Cardiologist (physician) and Advanced Practice Providers (APPs -  Physician Assistants and Nurse Practitioners) who all work together to provide you with the care you need, when you need it.  Your next appointment:   6 month(s)  Provider:   Thomasene Ripple, DO    Adopting a Healthy Lifestyle.  Know what a healthy weight  is for you (roughly BMI <25) and aim to maintain this  Aim for 7+ servings of fruits and vegetables daily   65-80+ fluid ounces of water or unsweet tea for healthy kidneys   Limit to max 1 drink of alcohol per day; avoid smoking/tobacco   Limit animal fats in diet for cholesterol and heart health - choose grass fed whenever available   Avoid highly processed foods, and foods high in saturated/trans fats   Aim for low stress - take time to unwind and care for your mental health   Aim for 150 min of moderate intensity exercise weekly for heart health, and weights twice weekly for bone health   Aim for 7-9 hours of sleep daily   When it comes to diets, agreement about the perfect plan isnt easy to find, even among the experts. Experts at the [facility] [employer] developed an idea known as the Healthy Eating Plate. Just imagine a plate divided into logical, healthy portions.   The emphasis is on diet quality:   Load up on vegetables and fruits - one-half of your plate: Aim for color and variety, and remember that potatoes dont count.   Go for whole grains - one-quarter of your plate: Whole wheat, barley, wheat berries, quinoa, oats, brown rice, and foods made with them. If you want pasta, go with whole wheat pasta.   Protein power - one-quarter of your plate: Fish, chicken, beans, and nuts are all healthy, versatile protein sources. Limit red meat.   The diet, however, does go beyond the plate, offering a few other suggestions.   Use healthy plant oils, such as olive, canola, soy, corn, sunflower and peanut. Check the labels, and avoid partially hydrogenated oil, which have unhealthy trans fats.   If youre thirsty, drink water. Coffee and tea are good in moderation, but skip sugary drinks and limit milk and dairy products to one or two daily servings.   The type of carbohydrate in the diet is more important than the amount. Some sources of carbohydrates, such as  vegetables, fruits, whole grains, and beans-are healthier than others.   Finally, stay active  Signed, Thomasene Ripple, DO  12/20/2023 1:22 PM    [facility]

## 2023-12-23 ENCOUNTER — Encounter: Payer: Self-pay | Admitting: Cardiology

## 2024-01-14 ENCOUNTER — Encounter (HOSPITAL_BASED_OUTPATIENT_CLINIC_OR_DEPARTMENT_OTHER): Payer: Self-pay | Admitting: Pulmonary Disease

## 2024-01-14 ENCOUNTER — Ambulatory Visit (HOSPITAL_BASED_OUTPATIENT_CLINIC_OR_DEPARTMENT_OTHER): Payer: No Typology Code available for payment source | Admitting: Pulmonary Disease

## 2024-01-14 VITALS — BP 124/82 | HR 74 | Ht 68.0 in | Wt 248.3 lb

## 2024-01-14 DIAGNOSIS — J4489 Other specified chronic obstructive pulmonary disease: Secondary | ICD-10-CM | POA: Diagnosis not present

## 2024-01-14 DIAGNOSIS — J439 Emphysema, unspecified: Secondary | ICD-10-CM | POA: Diagnosis not present

## 2024-01-14 DIAGNOSIS — R911 Solitary pulmonary nodule: Secondary | ICD-10-CM | POA: Diagnosis not present

## 2024-01-14 MED ORDER — IPRATROPIUM-ALBUTEROL 0.5-2.5 (3) MG/3ML IN SOLN: 3.0000 mL | Freq: Every day | RESPIRATORY_TRACT | 2 refills | Status: DC

## 2024-01-14 MED ORDER — FLUTICASONE-SALMETEROL 250-50 MCG/ACT IN AEPB: INHALATION_SPRAY | RESPIRATORY_TRACT | 3 refills | Status: DC

## 2024-01-14 MED ORDER — SPIRIVA RESPIMAT 2.5 MCG/ACT IN AERS: 2.0000 | INHALATION_SPRAY | Freq: Every day | RESPIRATORY_TRACT | 11 refills | Status: DC

## 2024-01-14 NOTE — Progress Notes (Signed)
 Subjective:   PATIENT ID: Jose Patrick GENDER: male DOB: 01-12-1951, MRN: [id number]   HPI  Chief Complaint  Patient presents with   Follow-up    COPD    Reason for Visit: Follow-up  [name] is a 73 year old male with hx lung cancer s/p left upper lobe lobectomy in 2013, COPD with emphysema, OSA nonadherent to CPAP, DM, HTN, reflux who presents for follow-up.  Synopsis He was diagnosed with COPD for > 30 years ago. At baseline has shortness of breath, non-productive coughing, chest congestion and wheezing however not optimized on bronchodilator regimen. Despite his symptoms he is active at baseline, swimming most days. Also enjoys the sauna to help his mind and breathing.   2023 - Completed Pulm Rehab in Jan. Intolerant of Spiriva due to throat irritation. Changed from Spiriva to Atrovent due to coverage. Had two exacerbations at the end of the year. 2024 - Retrial Spiriva due to persistent symptoms. Outpatient exacerbation in Jan and March. PET/CT 01/22/23 with interval development of RLL lung nodule concerning for recurrent cancer. This nodule was discussed at [facility] tumor board on 3/7. With increase in nodule size on follow-up imaging he underwent navigational bronchoscopy 05/21/23 at [facility] that was neg for [facility] however due to high suspicion, s/p radiation to RLL nodule. 2025 - Completed radiation to RLL lung nodule in Jan.  01/14/24 Since our last visit he underwent navigational bronchoscopy on 05/21/23 and notified there was no evidence of malignancy. Referred to Rad Onco for empiric treatment which he completed in January. He had PET/CT 11/25/22. He reports last exacerbation in January and treated with antibiotics. This has resolved. Compliant with Wixela and Spiriva but reports has been taking Wixela 2 inhalations twice a day and Spiriva 2 puffs twice a day c/o dizziness with use. He reports he needs to use his albuterol frequently especially in the mornings and with  activity. 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 Quit 10 years. 62 pack-years.   Past Medical History:  Diagnosis Date   ALLERGIC RHINITIS 10/08/2007   Qualifier: Diagnosis of  By: Jonny Ruiz MD, Len Blalock    BENIGN PROSTATIC HYPERTROPHY 09/28/2010   Qualifier: Diagnosis of  By: Jonny Ruiz MD, Len Blalock    Bipolar affective disorder (HCC) 09/30/2012   COLONIC POLYPS, HX OF 10/08/2007   Qualifier: Diagnosis of  By: Jonny Ruiz MD, Len Blalock    Complication of anesthesia    sleeps awhile afterwards per patient   CONSTIPATION, CHRONIC, HX OF 08/10/2007   Qualifier: Diagnosis of  By: Maris Berger    DIABETES MELLITUS, TYPE II 10/08/2007   Qualifier: Diagnosis of  By: Jonny Ruiz MD, Len Blalock    ERECTILE DYSFUNCTION 10/08/2007   Qualifier: Diagnosis of  By: Jonny Ruiz MD, Len Blalock    HYPERLIPIDEMIA 10/08/2007   Qualifier: Diagnosis of  By: Jonny Ruiz MD, Len Blalock    HYPERTENSION 03/20/2009   Qualifier: Diagnosis of  By: Jonny Ruiz MD, Len Blalock    PTSD (post-traumatic stress disorder) 09/30/2012   SPINAL STENOSIS, LUMBAR 10/08/2007   Per Dr Lovell Sheehan - [facility]   Qualifier: Diagnosis of  By: Jonny Ruiz MD, Len Blalock       Family History  Problem Relation Age of Onset   Cancer Mother    Ulcers Mother        Stomach   Depression Mother    Anxiety disorder Mother    Allergies Sister  Social History   Occupational History   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, 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  Tobacco Use   Smoking status: Former    Current packs/day: 0.00    Average packs/day: 1.5 packs/day for 41.0 years (61.5 ttl pk-yrs)    Types: Cigarettes    Start date: 76    Quit date: 2013    Years since quitting: 12.1   Smokeless tobacco: Never  Vaping Use   Vaping status: Never Used  Substance and Sexual Activity   Alcohol use: Yes   Drug use: No   Sexual activity: Not on file    Allergies  Allergen Reactions    Chlorzoxazone Swelling     Outpatient Medications Prior to Visit  Medication Sig Dispense Refill   albuterol (VENTOLIN HFA) 108 (90 Base) MCG/ACT inhaler Inhale 2 puffs into the lungs every 6 (six) hours as needed for wheezing or shortness of breath. 18 g 5   amLODipine (NORVASC) 10 MG tablet Take 10 mg by mouth every evening.     aspirin 81 MG chewable tablet Chew 1 tablet (81 mg total) by mouth daily. 90 tablet 3   atorvastatin (LIPITOR) 40 MG tablet Take 1 tablet (40 mg total) by mouth daily. 90 tablet 3   Cholecalciferol (VITAMIN D) 2000 UNITS CAPS Take 1 capsule by mouth daily.     fluticasone-salmeterol (WIXELA INHUB) 250-50 MCG/ACT AEPB ONE puff in the in morning and ONE puff in the evening 180 each 1   furosemide (LASIX) 20 MG tablet Take 20 mg by mouth as needed for edema.     gabapentin (NEURONTIN) 300 MG capsule Take 300 mg by mouth 2 (two) times daily.     guaiFENesin (MUCINEX) 600 MG 12 hr tablet Take 1 tablet (600 mg total) by mouth 2 (two) times daily as needed for to loosen phlegm or cough. 180 tablet 6   insulin glargine (LANTUS) 100 UNIT/ML injection 20 Units.     JARDIANCE 10 MG TABS tablet Take 10 mg by mouth daily.     losartan (COZAAR) 25 MG tablet Take 25 mg by mouth daily. 1/2 tab daily     metFORMIN (GLUCOPHAGE) 1000 MG tablet Take 1,000 mg by mouth 2 (two) times daily.     metoprolol succinate (TOPROL-XL) 100 MG 24 hr tablet Take 100 mg by mouth daily. Take with or immediately following a meal.     promethazine-dextromethorphan (PROMETHAZINE-DM) 6.25-15 MG/5ML syrup Take 5 mLs by mouth 4 (four) times daily as needed for cough. 118 mL 0   psyllium (METAMUCIL SMOOTH TEXTURE) 58.6 % powder Take 1 packet by mouth daily. 90 packet 1   Pyridoxine HCl (VITAMIN B6 PO) Take 1 tablet by mouth daily.      ranolazine (RANEXA) 500 MG 12 hr tablet Take 1 tablet (500 mg total) by mouth 2 (two) times daily. 180 tablet 3   Semaglutide,0.25 or 0.5MG /DOS, (OZEMPIC, 0.25 OR 0.5  MG/DOSE,) 2 MG/1.5ML SOPN Inject 0.5 mg into the skin once a week.     sitaGLIPtin (JANUVIA) 100 MG tablet Take 100 mg by mouth daily.     Testosterone 20.25 MG/ACT (1.62%) GEL testosterone 20.25 mg/1.25 gram per pump act.(1.62 %) transdermal gel     Tiotropium Bromide Monohydrate (SPIRIVA RESPIMAT) 2.5 MCG/ACT AERS Inhale 2 puffs into the lungs daily. 4 g 5   famotidine (PEPCID) 20 MG tablet Take 1 tablet (20 mg total) by mouth at bedtime. 90 tablet 2  sucralfate (CARAFATE) 1 g tablet Take 1 tablet (1 g total) by mouth with breakfast, with lunch, and with evening meal. 90 tablet 0   No facility-administered medications prior to visit.    Review of Systems  Constitutional:  Negative for chills, diaphoresis, fever, malaise/fatigue and weight loss.  HENT:  Negative for congestion.   Respiratory:  Positive for shortness of breath and wheezing. Negative for cough, hemoptysis and sputum production.   Cardiovascular:  Negative for chest pain, palpitations and leg swelling.     Objective:   Vitals:   01/14/24 1127  BP: 124/82  Pulse: 74  SpO2: 93%  Weight: 248 lb 4.8 oz (112.6 kg)  Height: 5\' 8"  (1.727 m)   SpO2: 93 %  Physical Exam: General: Well-appearing, no acute distress HENT: [place], AT Eyes: EOMI, no scleral icterus Respiratory: Clear to auscultation bilaterally.  No crackles, wheezing or rales Cardiovascular: RRR, -M/R/G, no JVD Extremities:-Edema,-tenderness Neuro: AAO x4, CNII-XII grossly intact Psych: Normal mood, normal affect  Data Reviewed:  Imaging: PET/CT 01/01/12 2.2 cm LUL hypermetabolic nodule in LUL. Paraseptal and centrilobular emphysema.  CXR 11/04/22 - Bibasilar atelectasis  PET/CT 01/22/23  (Report only) IMPRESSION: 1. Hypermetabolic RIGHT lower lobe pulmonary nodule concerning for  bronchogenic carcinoma.. Recommend tissue sampling versus short-term  CT follow-up.  2. No evidence of mediastinal nodal metastasis or distant metastatic  disease.  3. No  evidence of lung cancer recurrence in the LEFT upper lobe.  4. Aortic atherosclerosis.   CT Chest 04/28/23 - Enlargement of RLL to 12 mm  CT Chest 05/12/23 - RLL nodule measuring 10 mm, unchanged size and previously hypermetabolic on imaging  PET/CT 11/26/23 (report only) IMPRESSION: 1. Right lower lobe nodule does not show abnormal hypermetabolism, compatible with treatment response. Evolving changes of radiation therapy in the surrounding right lower lobe. 2. Slight hypermetabolism associated with minimal eccentric wall thickening in the distal esophagus. Difficult to exclude early malignancy. 3. Minimally prominent prostate. 4. Aortic atherosclerosis (ICD10-I70.0). 5. Emphysema (ICD10-J43.9).   CTA 12/02/23 - No pulmonary emboli. RLL nodule with motion on exam but noted to be unchanged by radiology. S/p post-treatment changes. Emphysema. Small hiatla hernia  PFT: 04/29/22 FVC 2.49 (70%) FEV1 1.59 (59%) Ratio 64  TLC 70% DLCO 66% Interpretation: Moderately severe obstructive and restrictive defect with mildly reduced DLCO.  Labs: CBC    Component Value Date/Time   WBC 6.7 12/02/2023 1610   RBC 5.97 (H) 12/02/2023 1610   HGB 18.7 (H) 12/02/2023 1616   HCT 55.0 (H) 12/02/2023 1616   PLT 217 12/02/2023 1610   MCV 88.1 12/02/2023 1610   MCH 27.6 12/02/2023 1610   MCHC 31.4 12/02/2023 1610   RDW 13.5 12/02/2023 1610   LYMPHSABS 1.3 12/02/2023 1610   MONOABS 0.7 12/02/2023 1610   EOSABS 0.1 12/02/2023 1610   BASOSABS 0.0 12/02/2023 1610   Abs eos 05/26/21 - 0     Assessment & Plan:   Discussion: 73 year old male with former smoker with hx lung cancer s/p LUL lobectomy 2013, RLL nodule suspicious for malignancy s/p radiation 11/2023, COPD/emphysema, OSA not on CPAP, DM2, HTN and reflux who presents for follow-up. [facility]. Counseled strongly on following directions on bronchodilators to minimize side effects. With persistent symptoms despite  triple therapy, will add daily nebulizer to regimen. Discussed clinical course and management of COPD including bronchodilator regimen, preventive care including vaccinations and action plan for exacerbation.  RLL nodule s/p radiation completed 11/2023 Hx lung cancer s/p LUL  lobectomy 2013 --Following [facility] Rad Onc with CT imaging in 6 months  COPD with emphysema - uncontrolled Moderate restrictive and obstructive defect in setting of lobectomy and emphysema --CONTINUE Spiriva TWO puffs in the morning --CONTINUE Wixela 250-50 ONE puff in the morning and ONE puff in the evening --START Duoneb nebulizer treatment ONCE a day --CONTINUE Albuterol AS NEEDED for shortness of breath or wheezing --CONTINUE mucinex twice a day  --Not a candidate for chronic macrolide due to arrhythmia and on other QTC prolongation drugs  OSA Managed at [facility] Maintenance Immunization History  Administered Date(s) Administered   Dtap, Unspecified 08/17/1958, 09/15/1958, 11/13/1960   Fluad Quad(high Dose 65+) 07/31/2020, 09/21/2021   Influenza Split 09/19/2014   Influenza Whole 10/12/2007, 09/28/2010   Influenza, High Dose Seasonal PF 09/03/2017, 08/13/2019   Influenza, Seasonal, Injecte, Preservative Fre 08/12/2016   Influenza-Unspecified 08/06/2011, 10/18/2012, 08/26/2013, 09/13/2015   Moderna Covid-19 Fall Seasonal Vaccine 29yrs & older 09/25/2023   Moderna Covid-19 Vaccine Bivalent Booster 31yrs & up 09/21/2021   Moderna Sars-Covid-2 Vaccination 12/22/2019, 01/19/2020, 11/02/2020   Pneumococcal Conjugate-13 11/21/2016   Pneumococcal Polysaccharide-23 12/06/2008, 01/19/2018   Pneumococcal-Unspecified 11/29/2011   Polio, Unspecified 07/11/1958, 08/17/1958, 11/13/1960   Smallpox 07/16/1958   Td 12/06/2008   Tdap 11/29/2011, 10/16/2021   Zoster Recombinant(Shingrix) 02/24/2019, 05/27/2019   Zoster, Live 11/03/2012   CT Lung Screen - hx lung cancer. Followed by Oncology at the [facility]  No orders of the  defined types were placed in this encounter.  Meds ordered this encounter  Medications   Tiotropium Bromide Monohydrate (SPIRIVA RESPIMAT) 2.5 MCG/ACT AERS    Sig: Inhale 2 puffs into the lungs daily.    Dispense:  4 g    Refill:  11   fluticasone-salmeterol (WIXELA INHUB) 250-50 MCG/ACT AEPB    Sig: ONE puff in the in morning and ONE puff in the evening    Dispense:  180 each    Refill:  3    Please dispense 3 inhalers   ipratropium-albuterol (DUONEB) 0.5-2.5 (3) MG/3ML SOLN    Sig: Take 3 mLs by nebulization daily.    Dispense:  180 mL    Refill:  2    Return for after PFT in October.  I have spent a total time of 40-minutes on the day of the appointment including chart review, data review, collecting history, coordinating care and discussing medical diagnosis and plan with the patient/family. Past medical history, allergies, medications were reviewed. Pertinent imaging, labs and tests included in this note have been reviewed and interpreted independently by me.  Bryden Darden Mechele Collin, MD [facility] 01/14/2024 11:31 AM  Office Number [phone]

## 2024-01-14 NOTE — Patient Instructions (Addendum)
 COPD with emphysema Moderate restrictive and obstructive defect in setting of lobectomy and emphysema --CONTINUE Spiriva TWO puffs in the morning --CONTINUE Wixela 250-50 ONE puff in the morning and ONE puff in the evening --START Duoneb nebulizer treatment ONCE a day --CONTINUE Albuterol AS NEEDED for shortness of breath or wheezing --CONTINUE mucinex twice a day

## 2024-01-30 ENCOUNTER — Ambulatory Visit: Payer: Self-pay | Admitting: Pulmonary Disease

## 2024-01-30 NOTE — Telephone Encounter (Signed)
 FYI

## 2024-01-30 NOTE — Telephone Encounter (Addendum)
 Call from [employer], spoke to [place], office reporting pt experiencing low O2 levels. Speaking to pt. No appts available.  TRIAGE SUMMARY NOTE: Pt reporting that he has been experiencing significant weakness, fatigue, intermittent dizziness, and more SOB than usual for the past few weeks, with his O2 sats "not staying up," he'll feel some improvement after using his nebulizer but oxygen still "going into the 70s%." Pt reporting that he is "struggling to breathe" and is "all discombobulated" about his meds with his current cancer/radiation. Pt reporting his current pulse ox is 91-92% and "feeling a little better now" since used his nebulizer but "can't trust my pulse ox too much" since still significant SOB. Pt confirms he has been using his maintenance meds but not been using his rescue inhaler, advised pt use his rescue inhaler and have wife drive him to ED, advised follow up with pulm after hospital. Pt verbalized understanding and will head there shortly. Pt also mentioned trouble navigating his nebulizer machine and trusting his pulse oximeter - please advise. Sending message to pulm office to follow up with pt regarding his concerns.  E2C2 Pulmonary Triage - Initial Assessment Questions "Chief Complaint (e.g., cough, sob, wheezing, fever, chills, sweat or additional symptoms) *Go to specific symptom protocol after initial questions. Low oxygen levels for a while, got pneumonia was very difficult, O2 into 70s, can't get it back up, 3rd week since pneumonia, don't have no strength, super fatigued, think it's because low on O2, more SOB than normal, just finished my breathing stuff and wixella and spiriva, got it up to 90%, 92% but it's gonna go back down Need nebulizer checked out to ensure working properly, think may have been putting it in the wrong receptacle and tube keeps popping out, might be using it wrong, there's still mist coming out and I'm breathing it and seems like chest is opening  up All discombobulated Radiation, cancer, getting all confused with my medication Blood sugar fine, blood pressure fine, but oxygen going into 70s Feel so weak and breathing is so shallow Tried to go outside but weather won't let me Struggling to breathe, moderate Do get dizzy sometimes Having hard time breathing, hard time with acid reflux Oxygen level gradually going down Going to sleep usually regenerates me, feeling a little bit better now since took nebulizer  "How long have symptoms been present?" Couple weeks  "Have you used your inhalers/maintenance medication?" Yes If yes, "What medications?" Nebulizer  If inhaler, ask "How many puffs and how often?" Note: Review instructions on medication in the chart. Rescue inhaler, not been using because keep using the wixella and spiriva twice a day each  OXYGEN: "Do you wear supplemental oxygen?" No  "Do you monitor your oxygen levels?" Yes If yes, "What is your reading (oxygen level) today?" Been going into 70s, don't know if can trust my pulse ox 90-91%, can't trust my pulse ox too much   Reason for Disposition  Difficulty breathing  Answer Assessment - Initial Assessment Questions 4. CAUSE: "What do you think is causing the weakness or fatigue?" (e.g., not drinking enough fluids, medical problem, trouble sleeping)     Not getting enough oxygen 6. OTHER SYMPTOMS: "Do you have any other symptoms?" (e.g., chest pain, fever, cough, SOB, vomiting, diarrhea, bleeding, other areas of pain)      Denies chest pain, fever, cough, other symptoms but experiencing significant SOB  Protocols used: Weakness (Generalized) and Fatigue-A-AH

## 2024-02-03 ENCOUNTER — Telehealth: Payer: Self-pay | Admitting: Pulmonary Disease

## 2024-02-03 ENCOUNTER — Other Ambulatory Visit (HOSPITAL_BASED_OUTPATIENT_CLINIC_OR_DEPARTMENT_OTHER): Payer: Self-pay

## 2024-02-03 MED ORDER — IPRATROPIUM-ALBUTEROL 0.5-2.5 (3) MG/3ML IN SOLN: 3.0000 mL | Freq: Every day | RESPIRATORY_TRACT | 3 refills | Status: DC

## 2024-02-03 NOTE — Telephone Encounter (Signed)
 Patient is in emergency room at [facility] and he was told that he needed to get in touch with his pulmonologist. He can be reached at  [phone]

## 2024-02-03 NOTE — Telephone Encounter (Signed)
 Patient has been scheduled for 02/09/24 with Tammy Parrett for qualifying walk for POC. Also notified per CMA script has been sent in.

## 2024-02-03 NOTE — Telephone Encounter (Signed)
 Pt needs an appt to qualify for POC per ER, he also requested a refill on Duoneb rx sent to pharmacy.

## 2024-02-09 ENCOUNTER — Encounter (HOSPITAL_BASED_OUTPATIENT_CLINIC_OR_DEPARTMENT_OTHER): Payer: Self-pay | Admitting: Adult Health

## 2024-02-09 ENCOUNTER — Ambulatory Visit (INDEPENDENT_AMBULATORY_CARE_PROVIDER_SITE_OTHER): Admitting: Adult Health

## 2024-02-09 VITALS — BP 122/78 | HR 79 | Ht 68.0 in | Wt 246.5 lb

## 2024-02-09 DIAGNOSIS — Z85118 Personal history of other malignant neoplasm of bronchus and lung: Secondary | ICD-10-CM

## 2024-02-09 DIAGNOSIS — Z923 Personal history of irradiation: Secondary | ICD-10-CM

## 2024-02-09 DIAGNOSIS — J439 Emphysema, unspecified: Secondary | ICD-10-CM

## 2024-02-09 DIAGNOSIS — G4730 Sleep apnea, unspecified: Secondary | ICD-10-CM | POA: Diagnosis not present

## 2024-02-09 DIAGNOSIS — Z87891 Personal history of nicotine dependence: Secondary | ICD-10-CM

## 2024-02-09 DIAGNOSIS — J4489 Other specified chronic obstructive pulmonary disease: Secondary | ICD-10-CM

## 2024-02-09 DIAGNOSIS — Z902 Acquired absence of lung [part of]: Secondary | ICD-10-CM

## 2024-02-09 DIAGNOSIS — J441 Chronic obstructive pulmonary disease with (acute) exacerbation: Secondary | ICD-10-CM

## 2024-02-09 MED ORDER — PREDNISONE 10 MG PO TABS: ORAL_TABLET | ORAL | 0 refills | Status: DC

## 2024-02-09 MED ORDER — IPRATROPIUM-ALBUTEROL 0.5-2.5 (3) MG/3ML IN SOLN: 3.0000 mL | Freq: Four times a day (QID) | RESPIRATORY_TRACT | Status: DC | PRN

## 2024-02-09 MED ORDER — PROMETHAZINE-DM 6.25-15 MG/5ML PO SYRP: 5.0000 mL | ORAL_SOLUTION | Freq: Every evening | ORAL | 0 refills | Status: DC | PRN

## 2024-02-09 NOTE — Patient Instructions (Addendum)
 BIPAP download.   Sleep study records.  Restart BIPAP At bedtime  and with naps .   Prednisone taper over next week.  Continue on Wixela 1 puff Twice daily, rinse after use.  Continue on Spiriva 2 puffs daily.  Increase Duoneb Twice daily   Begin Oxygen 2l/m with activity  Order for POC   Delsym 2 tsp Twice daily, As needed  cough  Promethazine DM .As needed  for severe cough, use with caution  Follow up Dr. Everardo All or Cyndia Degraff NP in 4-6 weeks and As needed   Please contact office for sooner follow up if symptoms do not improve or worsen or seek emergency care

## 2024-02-09 NOTE — Progress Notes (Unsigned)
 @Patient  ID: Jose Patrick, male    DOB: May 03, 1951, 73 y.o.   MRN: [id number]  Chief Complaint  Patient presents with   Follow-up   Discussed the use of AI scribe software for clinical note transcription with the patient, who gave verbal consent to proceed.  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 At baseline has shortness of breath, non-productive coughing, chest congestion and wheezing however not optimized on bronchodilator regimen. Despite his symptoms he is active at baseline, swimming most days. Also enjoys the sauna to help his mind and breathing.    2023 - Completed Pulm Rehab in Jan. Intolerant of Spiriva due to throat irritation. Changed from Spiriva to Atrovent due to coverage. Had two exacerbations at the end of the year. 2024 - Retrial Spiriva due to persistent symptoms. Outpatient exacerbation in Jan and March. PET/CT 01/22/23 with interval development of RLL lung nodule concerning for recurrent cancer. This nodule was discussed at [facility] tumor board on 3/7. With increase in nodule size on follow-up imaging he underwent navigational bronchoscopy 05/21/23 at [facility] that was neg for [facility] however due to high suspicion, s/p radiation to RLL nodule. 2025 - Completed radiation to RLL lung nodule in Jan. PET scan November 14, 2023 right lower lobe nodule does not show abnormal hypermetabolism, evolving changes of radiation in the right  lower lobe. PFTs April 29, 2022 showed moderate obstruction with FEV1 at 59%, ratio 64, FVC 70%, no significant bronchodilator response DLCO 66%.   02/09/2024 Follow up ; COPD with emphysema, emergency room follow-up Patient presents for a follow-up visit from recent emergency room.  ER notes were reviewed in detail on care everywhere.  Patient had increased shortness of breath went to the emergency room on February 03, 2024.  CT chest was negative for PE showed patchy consolidation in the right lower lobe and emphysema.  Patient did have some exertional desaturations and was recommended to follow-up in pulmonary today.  Patient says he is feeling better.  Walk test in the office shows patient dropped his O2 saturations to 88% on room air requires oxygen 2 L to maintain O2 saturations greater than 88 to 90%.  Patient was able to walk on 2 L pulsed oxygen on POC device at 2 L with O2 saturations remaining adequate above 88 to 90%.  Has OSA on BIPAP At bedtime . Admits that he does not use each night..  Jose Patrick is a 73 year old male with COPD who presents with worsening shortness of breath and cough.  He is experiencing worsening shortness of breath and cough, which he attributes to his COPD. He uses YXLA and Spiriva inhalers, taking two puffs of each twice daily, and has recently started using a nebulizer in the morning. Despite these treatments, his oxygen levels remain low, not exceeding 89% on his oximeter, and he feels excessively tired.  He recently completed radiation therapy, which he believes has further impacted his lung function. His oxygen levels have been low, and he  experiences significant fatigue, which he attributes to his sleep apnea. He does not use his BiPAP machine regularly due to concerns about infections, but acknowledges that his sleep apnea is not well controlled.  He has a history of diabetes, which he states is controlled, and is concerned about recurrent infections due  to his damaged lungs and other medical conditions. His cough has worsened since undergoing radiation therapy, and he experiences significant coughing at night, which disturbs his sleep.  He mentions having 17 grandchildren and describes himself as a 'softie,' indicating that he provides financial support to his family.        Allergies  Allergen Reactions   Chlorzoxazone Swelling    Immunization History  Administered Date(s) Administered   Dtap, Unspecified 08/17/1958, 09/15/1958, 11/13/1960   Fluad Quad(high Dose 65+) 07/31/2020, 09/21/2021   Influenza Split 09/19/2014   Influenza Whole 10/12/2007, 09/28/2010   Influenza, High Dose Seasonal PF 09/03/2017, 08/13/2019   Influenza, Seasonal, Injecte, Preservative Fre 08/12/2016   Influenza-Unspecified 08/06/2011, 10/18/2012, 08/26/2013, 09/13/2015   Moderna Covid-19 Fall Seasonal Vaccine 31yrs & older 09/25/2023   Moderna Covid-19 Vaccine Bivalent Booster 90yrs & up 09/21/2021   Moderna Sars-Covid-2 Vaccination 12/22/2019, 01/19/2020, 11/02/2020   Pneumococcal Conjugate-13 11/21/2016   Pneumococcal Polysaccharide-23 12/06/2008, 01/19/2018   Pneumococcal-Unspecified 11/29/2011   Polio, Unspecified 07/11/1958, 08/17/1958, 11/13/1960   Smallpox 07/16/1958   Td 12/06/2008   Tdap 11/29/2011, 10/16/2021   Zoster Recombinant(Shingrix) 02/24/2019, 05/27/2019   Zoster, Live 11/03/2012    Past Medical History:  Diagnosis Date   ALLERGIC RHINITIS 10/08/2007   Qualifier: Diagnosis of  By: Jonny Ruiz MD, Len Blalock    BENIGN PROSTATIC HYPERTROPHY 09/28/2010   Qualifier: Diagnosis of  By: Jonny Ruiz MD, Len Blalock    Bipolar affective disorder (HCC) 09/30/2012   COLONIC POLYPS, HX OF 10/08/2007   Qualifier: Diagnosis of  By: Jonny Ruiz MD, Len Blalock    Complication of anesthesia    sleeps awhile afterwards per patient   CONSTIPATION, CHRONIC, HX OF 08/10/2007   Qualifier: Diagnosis of  By: Maris Berger    DIABETES MELLITUS, TYPE II 10/08/2007    Qualifier: Diagnosis of  By: Jonny Ruiz MD, Len Blalock    ERECTILE DYSFUNCTION 10/08/2007   Qualifier: Diagnosis of  By: Jonny Ruiz MD, Len Blalock    HYPERLIPIDEMIA 10/08/2007   Qualifier: Diagnosis of  By: Jonny Ruiz MD, Len Blalock    HYPERTENSION 03/20/2009   Qualifier: Diagnosis of  By: Jonny Ruiz MD, Len Blalock    PTSD (post-traumatic stress disorder) 09/30/2012   SPINAL STENOSIS, LUMBAR 10/08/2007   Per Dr Lovell Sheehan - [facility]   Qualifier: Diagnosis of  By: Jonny Ruiz MD, Len Blalock      Tobacco History: Social History   Tobacco Use  Smoking Status Former   Current packs/day: 0.00   Average packs/day: 1.5 packs/day for 41.0 years (61.5 ttl pk-yrs)   Types: Cigarettes   Start date: 75   Quit date: 2013   Years since quitting: 12.2  Smokeless Tobacco Never   Counseling given: Not Answered   Outpatient Medications Prior to Visit  Medication Sig Dispense Refill   albuterol (VENTOLIN HFA) 108 (90 Base) MCG/ACT inhaler Inhale 2 puffs into the lungs every 6 (six) hours as needed for wheezing or shortness of breath. 18 g 5   amLODipine (NORVASC) 10 MG tablet Take 10 mg by mouth every evening.     aspirin 81 MG chewable tablet Chew 1 tablet (81 mg total) by mouth daily. 90 tablet 3   atorvastatin (  LIPITOR) 40 MG tablet Take 1 tablet (40 mg total) by mouth daily. 90 tablet 3   Cholecalciferol (VITAMIN D) 2000 UNITS CAPS Take 1 capsule by mouth daily.     fluticasone-salmeterol (WIXELA INHUB) 250-50 MCG/ACT AEPB ONE puff in the in morning and ONE puff in the evening 180 each 3   furosemide (LASIX) 20 MG tablet Take 20 mg by mouth as needed for edema.     gabapentin (NEURONTIN) 300 MG capsule Take 300 mg by mouth 2 (two) times daily.     guaiFENesin (MUCINEX) 600 MG 12 hr tablet Take 1 tablet (600 mg total) by mouth 2 (two) times daily as needed for to loosen phlegm or cough. 180 tablet 6   insulin glargine (LANTUS) 100 UNIT/ML injection 20 Units.     ipratropium-albuterol (DUONEB) 0.5-2.5 (3) MG/3ML SOLN Take 3  mLs by nebulization daily. 180 mL 3   JARDIANCE 10 MG TABS tablet Take 10 mg by mouth daily.     losartan (COZAAR) 25 MG tablet Take 25 mg by mouth daily. 1/2 tab daily     metFORMIN (GLUCOPHAGE) 1000 MG tablet Take 1,000 mg by mouth 2 (two) times daily.     metoprolol succinate (TOPROL-XL) 100 MG 24 hr tablet Take 100 mg by mouth daily. Take with or immediately following a meal.     promethazine-dextromethorphan (PROMETHAZINE-DM) 6.25-15 MG/5ML syrup Take 5 mLs by mouth 4 (four) times daily as needed for cough. 118 mL 0   psyllium (METAMUCIL SMOOTH TEXTURE) 58.6 % powder Take 1 packet by mouth daily. 90 packet 1   Pyridoxine HCl (VITAMIN B6 PO) Take 1 tablet by mouth daily.      ranolazine (RANEXA) 500 MG 12 hr tablet Take 1 tablet (500 mg total) by mouth 2 (two) times daily. 180 tablet 3   Semaglutide,0.25 or 0.5MG /DOS, (OZEMPIC, 0.25 OR 0.5 MG/DOSE,) 2 MG/1.5ML SOPN Inject 0.5 mg into the skin once a week.     sitaGLIPtin (JANUVIA) 100 MG tablet Take 100 mg by mouth daily.     Testosterone 20.25 MG/ACT (1.62%) GEL testosterone 20.25 mg/1.25 gram per pump act.(1.62 %) transdermal gel     Tiotropium Bromide Monohydrate (SPIRIVA RESPIMAT) 2.5 MCG/ACT AERS Inhale 2 puffs into the lungs daily. 4 g 11   famotidine (PEPCID) 20 MG tablet Take 1 tablet (20 mg total) by mouth at bedtime. 90 tablet 2   sucralfate (CARAFATE) 1 g tablet Take 1 tablet (1 g total) by mouth with breakfast, with lunch, and with evening meal. 90 tablet 0   No facility-administered medications prior to visit.     Review of Systems:   Constitutional:   No  weight loss, night sweats,  Fevers, chills, fatigue, or  lassitude.  HEENT:   No headaches,  Difficulty swallowing,  Tooth/dental problems, or  Sore throat,                No sneezing, itching, ear ache, nasal congestion, post nasal drip,   CV:  No chest pain,  Orthopnea, PND, swelling in lower extremities, anasarca, dizziness, palpitations, syncope.   GI  No  heartburn, indigestion, abdominal pain, nausea, vomiting, diarrhea, change in bowel habits, loss of appetite, bloody stools.   Resp: No shortness of breath with exertion or at rest.  No excess mucus, no productive cough,  No non-productive cough,  No coughing up of blood.  No change in color of mucus.  No wheezing.  No chest wall deformity  Skin: no rash or lesions.  GU:  no dysuria, change in color of urine, no urgency or frequency.  No flank pain, no hematuria   MS:  No joint pain or swelling.  No decreased range of motion.  No back pain.    Physical Exam  BP 122/78   Pulse 79   Ht 5\' 8"  (1.727 m)   Wt 246 lb 8 oz (111.8 kg)   SpO2 93%   BMI 37.48 kg/m   GEN: A/Ox3; pleasant , NAD, well nourished    HEENT:  [place]/AT,  EACs-clear, TMs-wnl, NOSE-clear, THROAT-clear, no lesions, no postnasal drip or exudate noted.   NECK:  Supple w/ fair ROM; no JVD; normal carotid impulses w/o bruits; no thyromegaly or nodules palpated; no lymphadenopathy.    RESP  Clear  P & A; w/o, wheezes/ rales/ or rhonchi. no accessory muscle use, no dullness to percussion  CARD:  RRR, no m/r/g, no peripheral edema, pulses intact, no cyanosis or clubbing.  GI:   Soft & nt; nml bowel sounds; no organomegaly or masses detected.   Musco: Warm bil, no deformities or joint swelling noted.   Neuro: alert, no focal deficits noted.    Skin: Warm, no lesions or rashes    Lab Results:  CBC    Component Value Date/Time   WBC 6.7 12/02/2023 1610   RBC 5.97 (H) 12/02/2023 1610   HGB 18.7 (H) 12/02/2023 1616   HCT 55.0 (H) 12/02/2023 1616   PLT 217 12/02/2023 1610   MCV 88.1 12/02/2023 1610   MCH 27.6 12/02/2023 1610   MCHC 31.4 12/02/2023 1610   RDW 13.5 12/02/2023 1610   LYMPHSABS 1.3 12/02/2023 1610   MONOABS 0.7 12/02/2023 1610   EOSABS 0.1 12/02/2023 1610   BASOSABS 0.0 12/02/2023 1610    BMET    Component Value Date/Time   NA 136 12/02/2023 1616   K 4.3 12/02/2023 1616   CL 99 12/02/2023  1616   CO2 23 12/02/2023 1610   GLUCOSE 149 (H) 12/02/2023 1616   BUN 14 12/02/2023 1616   CREATININE 1.20 12/02/2023 1616   CALCIUM 9.8 12/02/2023 1610   GFRNONAA >60 12/02/2023 1610   GFRAA  02/23/2009 1513    >60        The eGFR has been calculated using the MDRD equation. This calculation has not been validated in all clinical situations. eGFR's persistently <60 mL/min signify possible Chronic Kidney Disease.    BNP    Component Value Date/Time   BNP 38.4 12/02/2023 1610    ProBNP No results found for: "PROBNP"  Imaging: No results found.  Administration History     None          Latest Ref Rng & Units 04/29/2022    3:46 PM  PFT Results  FVC-Pre L 2.39   FVC-Predicted Pre % 67   FVC-Post L 2.49   FVC-Predicted Post % 70   Pre FEV1/FVC % % 65   Post FEV1/FCV % % 64   FEV1-Pre L 1.57   FEV1-Predicted Pre % 58   FEV1-Post L 1.59   DLCO uncorrected ml/min/mmHg 16.26   DLCO UNC% % 66   DLCO corrected ml/min/mmHg 16.26   DLCO COR %Predicted % 66   DLVA Predicted % 100   TLC L 4.66   TLC % Predicted % 70   RV % Predicted % 80     No results found for: "NITRICOXIDE"      Assessment & Plan:   No problem-specific Assessment & Plan notes found for this encounter.  Rubye Oaks, NP 02/09/2024

## 2024-02-10 ENCOUNTER — Telehealth: Payer: Self-pay | Admitting: Adult Health

## 2024-02-10 MED ORDER — DEXTROMETHORPHAN-GUAIFENESIN 10-100 MG/5ML PO LIQD: 5.0000 mL | Freq: Every evening | ORAL | 0 refills | Status: DC | PRN

## 2024-02-10 NOTE — Telephone Encounter (Signed)
 Jose Patrick states they do not have Promethazine DM. They have Tussin DM. Phone number is [phone].

## 2024-02-10 NOTE — Telephone Encounter (Signed)
 Called and spoke with pharmacy staff, verified that they do not have promethazine DM, they have Tussin DM.  Advised I would discuss with the provider and if that was ok with her we would send in a new order.  She verbalized understanding.  Spoke with Tammy, she advised that an order for Tussin DM can be sent to the pharmacy in place of the promethazine DM that they do not have.  Order sent to pharmacy.  Nothing further needed.

## 2024-02-18 ENCOUNTER — Ambulatory Visit: Payer: Self-pay | Admitting: Adult Health

## 2024-02-18 NOTE — Telephone Encounter (Signed)
 Copied from CRM [id number]. Topic: Clinical - Red Word Triage >> Feb 18, 2024 10:18 AM Jose Patrick wrote: Pt has not heard anything from O2 order and O2 has dropped as low as 79  TRIAGE SUMMARY NOTE: Pt reporting that he is still having trouble breathing with exertion, oxygen levels going down into 79-80% but pulse ox readings much better today 89-91%, pt doing all his nebulizer treatments and meds but not using rescue inhaler. Pt is concerned because he was prescribed oxygen therapy 2L for exertion a week ago but spoke with [facility] just before this phone call and they confirmed they have not received the script for pt's oxygen therapy/equipment. Per pt's chart, it appears the script may have been sent to [facility], pt reporting that the script needed to be sent to [facility], phone number [phone] with more info on the approval communication (which was needed for his appt with pulm office as well) in his file. Pt reporting that this SOB is "zapping my energy," also coughing up green sputum and doing so more frequently. Pt [facility] and [facility] his [facility] as well, diagnosed him with pneumonia in the past. Advised that HP message being sent to clarify status of DME prescription and where it will be filled  and that he will receive a call back from office with this info. Also advised that pt utilize his rescue inhaler in the meantime since not yet tried, call back if worsening or new symptoms. Pt verbalized understanding and confirms [facility] will [facility]. Please advise.  E2C2 Pulmonary Triage - Initial Assessment Questions "Chief Complaint (e.g., cough, sob, wheezing, fever, chills, sweat or additional symptoms) *Go to specific symptom protocol after initial questions. Can't hardly walk without needing to stop My concern is that it's been a week and not heard anything from office about need for oxygen tanks and oxygen therapy SOB whether  active or not, keep going to sleep Been in 88-89 then fell down to 79-80, but may have to do with pollen What scares me is it's zapping my energy and have to lay down and sleep all the time SOB not severe just labored Congestion and chest congestion, still sick with cold Coughing up brown changed to green then to clear then multiple events like coming up with crazy frequency not all at one time [facility], they're gonna [facility] him out No chest pain  MEDICINES:   "Have you used any OTC meds to help with symptoms?" Yes If yes, ask "What medications?" Cough medicine feel like made it worse  "Have you used your inhalers/maintenance medication?" Yes If yes, "What medications?" Nebulizer 2x/day, not using rescue inhaler, using green one and the disc 2x/day, nebulizer  OXYGEN: "Do you wear supplemental oxygen?" No If yes, "How many liters are you supposed to use?" Supposed to have oxygen therapy 2L for exertion but not received by pharmacy yet  "Do you monitor your oxygen levels?" Yes If yes, "What is your reading (oxygen level) today?" 89% but when doing nebulizer and all inhalers it had no effect whatsoever, did check his pulse ox to be accurate, may be seasonal 91% better   Needed to be sent to [facility], not [facility] Phone number [phone] on approval communication in file Reason for Disposition  [1] Longstanding difficulty breathing (e.g., CHF, COPD, emphysema) AND [2] WORSE than normal  Protocols used: Breathing Difficulty-A-AH

## 2024-02-19 NOTE — Telephone Encounter (Signed)
 Copied from CRM [id number]. Topic: Clinical - Request for Lab/Test Order >> Feb 19, 2024  1:27 PM Payton Doughty wrote: Reason for CRM: pt following up on request for an O2 tank and it needs to g to [facility]. (Not pharmacy) [facility] and he needs this O2 tank ASAP. Pt states he is consistently at 87 Have you received order? Please follow up w/ the pt.  Raven can you please advise \   Thank you

## 2024-02-23 NOTE — Telephone Encounter (Addendum)
 Spoke with patient regarding prior message.Advised patient that Order has been faxed to the [place] and the Pcc's will follow up on the order.Patient stated he can barley breathe and been trying to keep his Stats above 88. Advised patient that if his Sats go below 88 he would need to go to the ER.Patient stated its he feels embarrassed to go to the ER.Advised patient dont feel embarrassed to go to the ER.Advised patient I will follow up with the pcc's regarding referral. Patient's voice was understanding.

## 2024-02-24 ENCOUNTER — Telehealth: Payer: Self-pay | Admitting: Cardiology

## 2024-02-24 NOTE — Telephone Encounter (Signed)
 STAT if HR is under 50 or over 120 (normal HR is 60-100 beats per minute)  What is your heart rate? 105 - currently while on the phone   Do you have a log of your heart rate readings (document readings)?  100-124 range for about 3-4 days   Do you have any other symptoms?  Fatigue. Pt states when his O2 levels go down, his hr goes up. He states Pulmonary is trying to handle his O2 levels but it wants to know what he should do about his HR. He states he was advised that when his levels got low he should go to the ED but he stated he didn't want to go because that "embarrassing". He states he is also still having SOB with exertion (walking). Please advise.

## 2024-02-24 NOTE — Telephone Encounter (Signed)
 Patient identification verified by 2 forms.   Called and spoke to patient  Patient states:  84% O2, 104 HR  -When my oxygen drops my heart rate goes up. "Is that my heart being stressed?" -The paperwork is was sent to the wrong  -This has been going on for over 3 weeks.   Patient denies:  -Wanting to go to the ED but will go if his heart rate spikes again.        Interventions/Plan: -Thursday at 2pm pt has an appt for reevaluation.  Reviewed ED warning signs/precautions  Patient agrees with plan, no questions at this time

## 2024-02-24 NOTE — Telephone Encounter (Signed)
 Has they said anything about his oxygen order ?

## 2024-02-25 NOTE — Telephone Encounter (Signed)
 OK. Thank you

## 2024-03-08 ENCOUNTER — Ambulatory Visit (HOSPITAL_BASED_OUTPATIENT_CLINIC_OR_DEPARTMENT_OTHER): Payer: Self-pay | Admitting: Pulmonary Disease

## 2024-03-08 NOTE — Telephone Encounter (Signed)
 E2C2 Pulmonary Triage - Initial Assessment Questions "Chief Complaint (e.g., cough, sob, wheezing, fever, chills, sweat or additional symptoms) *Go to specific symptom protocol after initial questions. Patient with hx COPD with complaints of not being able to clear his throat, cough which is nonproductive, shortness of breath, throat pain-very hoarse sounding. Patient is actively coughing while speaking with NT. Patient states his oxygen saturation has been in the mid 80s all week. Patient's pulse oximeter reading currently in 83% on 2L.  Patient reports being very fatigued and very short of breath. Reports he was seen at the [place] in the last week and was started on five days of prednisone  which patient states he felt better for a day and then went back to not feeling well. Patient is recommended to the ED to be evaluated. Patient verbalized understanding of plan and all questions answered.   "How long have symptoms been present?" Going on for a week.   Have you tested for COVID or Flu? Note: If not, ask patient if a home test can be taken. If so, instruct patient to call back for positive results. No  MEDICINES:   "Have you used any OTC meds to help with symptoms?" yes If yes, ask "What medications?" Mucinex   "Have you used your inhalers/maintenance medication?" Yes If yes, "What medications?" Spiriva  albuterol   If inhaler, ask "How many puffs and how often?" Note: Review instructions on medication in the chart. Spiriva  2 puffs Daily Albuterol  2 puffs Q6hrs PRN Duoneb nebulizers 3ml q6hours PRN  OXYGEN: "Do you wear supplemental oxygen?" Yes If yes, "How many liters are you supposed to use?" 2L  "Do you monitor your oxygen levels?" Yes If yes, "What is your reading (oxygen level) today?" 83%  "What is your usual oxygen saturation reading?"  (Note: Pulmonary O2 sats should be 90% or greater) Patient thinks his normal is 94% when he is feeling better   Copied from CRM [id number].  Topic: Clinical - Red Word Triage >> Mar 08, 2024 11:44 AM Justina Oman C wrote: Red Word that prompted transfer to Nurse Triage: Patient [phone] states cannot clear his chest, congestion, oxygen level is not going up it's in the 83, shortness of breath, heavily fatigue, lightheaded, throat pain, denies a fever. Please advise. Reason for Disposition  Oxygen level (e.g., pulse oximetry) 90 percent or lower  Answer Assessment - Initial Assessment Questions 1. RESPIRATORY STATUS: "Describe your breathing?" (e.g., wheezing, shortness of breath, unable to speak, severe coughing)      Shortness of breath, coughing 2. ONSET: "When did this breathing problem begin?"      Started within the week 3. PATTERN "Does the difficult breathing come and go, or has it been constant since it started?"      constant 4. SEVERITY: "How bad is your breathing?" (e.g., mild, moderate, severe)    - MILD: No SOB at rest, mild SOB with walking, speaks normally in sentences, can lie down, no retractions, pulse < 100.    - MODERATE: SOB at rest, SOB with minimal exertion and prefers to sit, cannot lie down flat, speaks in phrases, mild retractions, audible wheezing, pulse 100-120.    - SEVERE: Very SOB at rest, speaks in single words, struggling to breathe, sitting hunched forward, retractions, pulse > 120      moderate 5. RECURRENT SYMPTOM: "Have you had difficulty breathing before?" If Yes, ask: "When was the last time?" and "What happened that time?"      yes 6. CARDIAC HISTORY: "Do you have any  history of heart disease?" (e.g., heart attack, angina, bypass surgery, angioplasty)       7. LUNG HISTORY: "Do you have any history of lung disease?"  (e.g., pulmonary embolus, asthma, emphysema)     COPD on oxygen 8. CAUSE: "What do you think is causing the breathing problem?"      unsure 9. OTHER SYMPTOMS: "Do you have any other symptoms? (e.g., dizziness, runny nose, cough, chest pain, fever)     Throat pain, cough  nonproductive, fatigue 10. O2 SATURATION MONITOR:  "Do you use an oxygen saturation monitor (pulse oximeter) at home?" If Yes, ask: "What is your reading (oxygen level) today?" "What is your usual oxygen saturation reading?" (e.g., 95%)       83% on 2L 12. TRAVEL: "Have you traveled out of the country in the last month?" (e.g., travel history, exposures)       no  Protocols used: Breathing Difficulty-A-AH

## 2024-03-08 NOTE — Telephone Encounter (Signed)
 FYI

## 2024-03-09 ENCOUNTER — Telehealth: Payer: Self-pay | Admitting: Cardiology

## 2024-03-09 ENCOUNTER — Telehealth (HOSPITAL_BASED_OUTPATIENT_CLINIC_OR_DEPARTMENT_OTHER): Payer: Self-pay

## 2024-03-09 NOTE — Telephone Encounter (Signed)
 Spoke with patient and notified him , he states he just got off the phone with the [place] and they are working on finding a DME for him and he did give them the recommendations from the hospital provider

## 2024-03-09 NOTE — Telephone Encounter (Signed)
 Secure chat sent to [facility] to check on status of POC order sent on 02/09/2024 will await response then call patient as hospital who is qualifying him for 3L should be able to place the order for increase.We did a qualify in March for 2L POC

## 2024-03-09 NOTE — Telephone Encounter (Signed)
 Copied from CRM [id number]. Topic: General - Other >> Mar 09, 2024  9:08 AM Laurina Popper O wrote: Reason for CRM: patient is hospitalized in [facility] in [place] .that its recommended that my oxygen be increased to three. 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 Need the prescription put in for portable oxygen . Need this asap . Can not carry the small tanks provided. Need this before being re;eased. This need to sent community care

## 2024-03-09 NOTE — Telephone Encounter (Signed)
 Secure chat sent to provider and Delle Ferdinand, I just got off the phone with [facility] in the [facility] department. I spoke with Crystal. She said that he got set up on his oxygen on April 10. They do not provide POC concentrators so [facility] will have to [facility] another DME vendor for that but he is all good for his regular oxygen delivery orders. He will be set up again in the next 30 days for more oxygen.     [facility] [phone] ext: [phone]( [facility])  Crystal ( DME REP ) ext. [phone]      [facility] [phone] [id number]

## 2024-03-09 NOTE — Telephone Encounter (Signed)
 Pt c/o Shortness Of Breath: STAT if SOB developed within the last 24 hours or pt is noticeably SOB on the phone  1. Are you currently SOB (can you hear that pt is SOB on the phone)?  No   2. How long have you been experiencing SOB?  2+ months, but worse after radiation last month + had pneumonia last month   3. Are you SOB when sitting or when up moving around?  Both   4. Are you currently experiencing any other symptoms?  Severe fatigue/tiredness  Patient currently admitted at [facility]. 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 O2 level had to be increased after 1 week on it. He says radiology has been updated as well, but she hasn't heard back from them yet. He assumes he will have to undergo chemo. 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

## 2024-03-09 NOTE — Telephone Encounter (Signed)
 Patient identification verified by 2 forms. Jose Lucky, RN    Called and spoke to patient  Patient states:   -currently admitted at [facility]   -Provider at [facility] wanted him to provide updates to his doctors about current health   -he has COPD exacerbation, multiple right and left pulmonary nodule, his Heart is fine   -His primary cardiologist is Dr. Emmette Harms  Informed patient message sent  Patient has no other questions or concerns at this time

## 2024-03-12 NOTE — Telephone Encounter (Signed)
 I have called patient and he reports he has received small portable oxygen tanks from the [place]. He is currently happy with these.  He reports [facility] is going [facility] to work with him to see if they can find a refurbished pulsed O2 device. I informed him that our office would close this encounter and that I hoped the [place] can meet his needs for the pulsed oxygen device.   Closing encounter.

## 2024-03-15 ENCOUNTER — Encounter (HOSPITAL_BASED_OUTPATIENT_CLINIC_OR_DEPARTMENT_OTHER): Payer: Self-pay

## 2024-03-17 ENCOUNTER — Ambulatory Visit (HOSPITAL_BASED_OUTPATIENT_CLINIC_OR_DEPARTMENT_OTHER): Admitting: Pulmonary Disease

## 2024-03-19 ENCOUNTER — Ambulatory Visit (HOSPITAL_BASED_OUTPATIENT_CLINIC_OR_DEPARTMENT_OTHER): Payer: Self-pay | Admitting: Pulmonary Disease

## 2024-03-19 MED ORDER — DEXTROMETHORPHAN-GUAIFENESIN 10-100 MG/5ML PO LIQD: 5.0000 mL | ORAL | 1 refills | Status: DC | PRN

## 2024-03-19 NOTE — Telephone Encounter (Signed)
 Please advise on cough syrup as pt has an appt with you on 03/24/2024

## 2024-03-19 NOTE — Telephone Encounter (Signed)
Ok to refill, sent

## 2024-03-19 NOTE — Telephone Encounter (Signed)
 E2C2 Pulmonary Triage - Initial Assessment Questions "Chief Complaint (e.g., cough, sob, wheezing, fever, chills, sweat or additional symptoms) *Go to specific symptom protocol after initial questions. Patient with hx of COPD and admitted 03/08/2024 to [facility] calling to request med refill for dextromethorphan -guaifenesin  (TUSSIN DM) 10-100 MG/5ML for today if possible. Patient states he has been trying to get the medication refilled by PCP and hasn't had any success. Patient was told that pulmonary has to refill. Patient endorses cough and congestion since being admitted to the hospital 03/08/2024. Patient endorses moderate shortness of breath with exertion. Baseline mild shortness of breath with no exertion. Patient states cough is nonproductive.  Pulse ox on 2L 91%. Patient is scheduled for follow up with pulmonary on 03/24/2024-this was a reschedule due to Dr. Washington Hacker going on maternity leave. Patient is asking if cough medication can be refilled today. Utilization of UC and ED explained to patient. Patient was told that office does close at 12 PM today. Patient is needing a follow up call today if possible.  "How long have symptoms been present?" Since discharge from hospital  Have you tested for COVID or Flu? Note: If not, ask patient if a home test can be taken. If so, instruct patient to call back for positive results. No  MEDICINES:   "Have you used any OTC meds to help with symptoms?" No If yes, ask "What medications?"   "Have you used your inhalers/maintenance medication?" Yes If yes, "What medications?" Albuterol   Duoneb Wixela  If inhaler, ask "How many puffs and how often?" Note: Review instructions on medication in the chart.   OXYGEN: "Do you wear supplemental oxygen?" Yes If yes, "How many liters are you supposed to use?" 2L  "Do you monitor your oxygen levels?" Yes If yes, "What is your reading (oxygen level) today?" 90%  "What is your usual oxygen saturation reading?"   (Note: Pulmonary O2 sats should be 90% or greater) 91%    Copied from CRM #[id number]. Topic: Clinical - Pink Word Triage >> Mar 19, 2024  9:17 AM Juliana Ocean wrote: Reason for Triage: pt has been trying to get cough medicine for several weeks. He is a pt of Dr Washington Hacker and says she was filling, but it goes through the [place]. He continues to cough the entire conversation when he got upset over not getting this med.   Pt needing  cough medicine asap for contained coughing w/ PNA Reason for Disposition  [1] MILD difficulty breathing (e.g., minimal/no SOB at rest, SOB with walking, pulse <100) AND [2] still present when not coughing  Answer Assessment - Initial Assessment Questions 1. ONSET: "When did the cough begin?"      Shortness of breath, cough 2. SEVERITY: "How bad is the cough today?"      6 out of 10 3. SPUTUM: "Describe the color of your sputum" (none, dry cough; clear, white, yellow, green)     Dry cough 4. HEMOPTYSIS: "Are you coughing up any blood?" If so ask: "How much?" (flecks, streaks, tablespoons, etc.)     no 5. DIFFICULTY BREATHING: "Are you having difficulty breathing?" If Yes, ask: "How bad is it?" (e.g., mild, moderate, severe)    - MILD: No SOB at rest, mild SOB with walking, speaks normally in sentences, can lie down, no retractions, pulse < 100.    - MODERATE: SOB at rest, SOB with minimal exertion and prefers to sit, cannot lie down flat, speaks in phrases, mild retractions, audible wheezing, pulse 100-120.    -  SEVERE: Very SOB at rest, speaks in single words, struggling to breathe, sitting hunched forward, retractions, pulse > 120      Mild to Moderate 6. FEVER: "Do you have a fever?" If Yes, ask: "What is your temperature, how was it measured, and when did it start?"     no 7. CARDIAC HISTORY: "Do you have any history of heart disease?" (e.g., heart attack, congestive heart failure)      no 8. LUNG HISTORY: "Do you have any history of lung disease?"  (e.g., pulmonary  embolus, asthma, emphysema)     COPD 9. PE RISK FACTORS: "Do you have a history of blood clots?" (or: recent major surgery, recent prolonged travel, bedridden)     no 10. OTHER SYMPTOMS: "Do you have any other symptoms?" (e.g., runny nose, wheezing, chest pain)        12. TRAVEL: "Have you traveled out of the country in the last month?" (e.g., travel history, exposures)       no  Protocols used: Cough - Acute Non-Productive-A-AH

## 2024-03-19 NOTE — Telephone Encounter (Signed)
 Pt is aware of below message and voiced his understanding. Nothing further needed.

## 2024-03-19 NOTE — Addendum Note (Signed)
 Addended by: Antonio Baumgarten on: 03/19/2024 11:29 AM   Modules accepted: Orders

## 2024-03-19 NOTE — Telephone Encounter (Signed)
 Copied From CRM [id number]. Reason for Triage: pt has been trying to get cough medicine for several weeks. He is a pt of Dr Washington Hacker and says she was filling, but it goes through the [place].  He continues to cough the entire conversation when he got upset over not getting this med.   Pt needing  cough medicine asap for contained coughing w/ PNA    This encounter was created in error - please disregard.

## 2024-03-23 ENCOUNTER — Telehealth: Payer: Self-pay

## 2024-03-23 NOTE — Telephone Encounter (Signed)
 Pt is scheduled to see Irby Mannan, NP tomorrow ( 03-24-24) for a f/u visit. I was unable to find a compliance report in [employer]. I tried calling the [place]. The vm stated they could get back in 72 hours. I tried calling pt to see if he was still using anything to treat his OSA but I received his vm. I lvm asking for a return call back.

## 2024-03-24 ENCOUNTER — Telehealth: Payer: Self-pay | Admitting: Primary Care

## 2024-03-24 ENCOUNTER — Encounter: Payer: Self-pay | Admitting: Primary Care

## 2024-03-24 ENCOUNTER — Ambulatory Visit: Admitting: Primary Care

## 2024-03-24 VITALS — BP 118/68 | HR 88 | Temp 98.4°F | Ht 68.0 in | Wt 237.2 lb

## 2024-03-24 DIAGNOSIS — E119 Type 2 diabetes mellitus without complications: Secondary | ICD-10-CM

## 2024-03-24 DIAGNOSIS — Z87891 Personal history of nicotine dependence: Secondary | ICD-10-CM

## 2024-03-24 DIAGNOSIS — Z902 Acquired absence of lung [part of]: Secondary | ICD-10-CM | POA: Diagnosis not present

## 2024-03-24 DIAGNOSIS — R911 Solitary pulmonary nodule: Secondary | ICD-10-CM

## 2024-03-24 DIAGNOSIS — Z794 Long term (current) use of insulin: Secondary | ICD-10-CM

## 2024-03-24 DIAGNOSIS — J449 Chronic obstructive pulmonary disease, unspecified: Secondary | ICD-10-CM | POA: Diagnosis not present

## 2024-03-24 DIAGNOSIS — J439 Emphysema, unspecified: Secondary | ICD-10-CM

## 2024-03-24 DIAGNOSIS — C3491 Malignant neoplasm of unspecified part of right bronchus or lung: Secondary | ICD-10-CM

## 2024-03-24 DIAGNOSIS — G4733 Obstructive sleep apnea (adult) (pediatric): Secondary | ICD-10-CM | POA: Diagnosis not present

## 2024-03-24 MED ORDER — PREDNISONE 10 MG PO TABS: ORAL_TABLET | ORAL | 0 refills | Status: DC

## 2024-03-24 NOTE — Progress Notes (Signed)
 @Patient  ID: Jose Patrick, male    DOB: [birth year], 73 y.o.   MRN: [id number]  No chief complaint on file.   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  ER notes were reviewed in detail on care everywhere.  Patient had increased shortness of breath went to the emergency room on February 03, 2024.  CT chest was negative for PE showed patchy consolidation in the right lower lobe and emphysema.  Patient did have some exertional desaturations and was recommended to follow-up in pulmonary today.  Patient says he is  feeling better but still gets winded easily with low activity tolerance .  Walk test in the office shows patient dropped his O2 saturations to 88% on room air requires oxygen 2 L to maintain O2 saturations greater than 88 to 90%.  Patient was able to walk on 2 L pulsed oxygen on POC device at 2 L with O2 saturations remaining adequate above 88 to 90%.  He is experiencing worsening shortness of breath and cough,does feel he more dyspneic with activities. He use Wixela and Spiriva  inhalers,  Has been using Duoneb neb in the morning.  Despite these treatments, his oxygen levels remain low at times.   and he feels excessively tired.  He recently completed radiation therapy, which he believes has further impacted his lung function. Feels he has not been  breathing as well since XRT completed.   He has a history of diabetes, CAD and HTN.   Has OSA on BIPAP At bedtime . Admits that he does not use each night..  Gets supplies through the [place]. Download requested.     03/24/2024- Interim hx  Discussed the use of AI scribe software for clinical note transcription with the patient, who gave verbal consent to proceed.  History of Present Illness   Jose Patrick is a 73 year old male with COPD and a history of lung cancer who presents with worsening respiratory symptoms and recent pneumonia.  He has a history of lung cancer with a left upper lobectomy in 2013 and recently completed radiation therapy. PET scan 11/13/24 did not show abnormal hypermetabolism, compatible with treatment response. Following radiation, he developed pneumonia and was hospitalized in April for right-sided pneumonia, treated with doxycycline. He was readmitted to the emergency room yesterday with worsening symptoms, including a productive cough with orange sputum. CT chest showed slight worsening right lower lobe and right middle lobe airspace disease with concern for central obstruction lesion.  There is been increase in size and number of pulmonary nodules concerning for metastatic disease.  He was treated with IV Levaquin and sent home with a prescription for seven days of Levaquin and cough syrup.  He experiences significant shortness of breath, even at rest, with oxygen saturation dropping to 87-88% without supplemental oxygen. He uses a home oxygen concentrator but finds it noisy and prefers portable oxygen for mobility. He has difficulty using his BiPAP due to  excessive coughing, particularly at night, which prevents effective use.  His current medication regimen includes Wixela and Spiriva  inhalers, which he uses consistently, and nebulizer treatments three times daily. He also takes Mucinex  for mucus clearance. Despite these treatments, he feels they are not sufficiently  effective, although he noted some improvement after receiving antibiotics in the emergency room yesterday.  He has a history of diabetes, managed with Ozempic , Lantus  insulin, and Jardiance . He monitors his blood sugar, which has been stable around 112-130 mg/dL, though he notes fluctuations with prednisone  use. He has experienced significant weight loss, from 250 lbs to 231 lbs, which he attributes to recent hospitalizations and fluid loss.  He faces difficulty managing his oxygen needs due to issues with his insurance and the physical burden of carrying oxygen tanks, exacerbated by rotator cuff issues in both arms. He is unable to drive or attend church due to these limitations.      TEST/EVENTS :  Synopsis He was diagnosed with COPD for > 30 years ago. At baseline has shortness of breath, non-productive coughing, chest congestion and wheezing  Despite his symptoms he is active at baseline, swimming most days. Also enjoys the sauna to help his mind and breathing.    2023 - Completed Pulm Rehab in Jan. Intolerant of Spiriva  due to throat irritation. Changed from Spiriva  to Atrovent  due to coverage. Had two exacerbations at the end of the year. 2024 - Retrial Spiriva  due to persistent symptoms. Outpatient exacerbation in Jan and March. PET/CT 01/22/23 with interval development of RLL lung nodule concerning for recurrent cancer. This nodule was discussed at [facility] tumor board on 3/7. With increase in nodule size on follow-up imaging he underwent navigational bronchoscopy 05/21/23 at [facility] that was neg for [facility] however due to high suspicion-PET positive s/p radiation to RLL nodule. 2025 - Completed radiation to RLL lung nodule in Jan.  Imaging  PET scan November 14, 2023 right lower lobe nodule does not show abnormal hypermetabolism, evolving changes of radiation in the right lower lobe.  CTA 03/23/24>> Right lower lobe and right middle lobe pneumonia. Bilateral pulmonary nodules are concerning for  metastatic disease. Also noted is mediastinal and right hilar adenopathy   Pulmonary testing PFTs April 29, 2022 showed moderate obstruction with FEV1 at 59%, ratio 64, FVC 70%, no significant bronchodilator response DLCO 66%.  Allergies  Allergen Reactions   Chlorzoxazone Swelling    Immunization History  Administered Date(s) Administered   Dtap, Unspecified 08/17/1958, 09/15/1958, 11/13/1960   Fluad Quad(high Dose 65+) 07/31/2020, 09/21/2021   Influenza Split 09/19/2014   Influenza Whole 10/12/2007, 09/28/2010   Influenza, High Dose Seasonal PF 09/03/2017, 08/13/2019   Influenza, Seasonal, Injecte, Preservative Fre 08/12/2016   Influenza-Unspecified 08/06/2011, 10/18/2012, 08/26/2013, 09/13/2015   Moderna Covid-19 Fall Seasonal Vaccine 91yrs & older 09/25/2023   Moderna Covid-19 Vaccine Bivalent Booster 78yrs & up 09/21/2021   Moderna Sars-Covid-2 Vaccination 12/22/2019, 01/19/2020, 11/02/2020   Pneumococcal Conjugate-13 11/21/2016   Pneumococcal Polysaccharide-23 12/06/2008, 01/19/2018   Pneumococcal-Unspecified 11/29/2011   Polio, Unspecified 07/11/1958, 08/17/1958, 11/13/1960   Smallpox 07/16/1958   Td 12/06/2008   Tdap 11/29/2011, 10/16/2021   Zoster Recombinant(Shingrix) 02/24/2019, 05/27/2019   Zoster, Live 11/03/2012    Past Medical History:  Diagnosis Date   ALLERGIC RHINITIS 10/08/2007   Qualifier: Diagnosis of  By: Autry Legions MD, Alveda Aures    BENIGN PROSTATIC HYPERTROPHY 09/28/2010   Qualifier: Diagnosis of  By: Autry Legions MD, Alveda Aures    Bipolar affective disorder (HCC) 09/30/2012   COLONIC POLYPS, HX  OF 10/08/2007   Qualifier: Diagnosis of  By: Autry Legions MD, Alveda Aures    Complication of anesthesia    sleeps awhile afterwards per patient   CONSTIPATION, CHRONIC, HX OF 08/10/2007   Qualifier: Diagnosis of  By: Jarold Merlin    DIABETES MELLITUS, TYPE II 10/08/2007   Qualifier: Diagnosis of  By: Autry Legions MD, Alveda Aures    ERECTILE DYSFUNCTION 10/08/2007   Qualifier: Diagnosis  of  By: Autry Legions MD, Alveda Aures    HYPERLIPIDEMIA 10/08/2007   Qualifier: Diagnosis of  By: Autry Legions MD, Alveda Aures    HYPERTENSION 03/20/2009   Qualifier: Diagnosis of  By: Autry Legions MD, Alveda Aures    PTSD (post-traumatic stress disorder) 09/30/2012   SPINAL STENOSIS, LUMBAR 10/08/2007   Per Dr Larrie Po - [facility]   Qualifier: Diagnosis of  By: Autry Legions MD, Alveda Aures      Tobacco History: Social History   Tobacco Use  Smoking Status Former   Current packs/day: 0.00   Average packs/day: 1.5 packs/day for 41.0 years (61.5 ttl pk-yrs)   Types: Cigarettes   Start date: 13   Quit date: 2013   Years since quitting: 12.3  Smokeless Tobacco Never   Counseling given: Not Answered   Outpatient Medications Prior to Visit  Medication Sig Dispense Refill   albuterol  (VENTOLIN  HFA) 108 (90 Base) MCG/ACT inhaler Inhale 2 puffs into the lungs every 6 (six) hours as needed for wheezing or shortness of breath. 18 g 5   amLODipine  (NORVASC ) 10 MG tablet Take 10 mg by mouth every evening.     aspirin  81 MG chewable tablet Chew 1 tablet (81 mg total) by mouth daily. 90 tablet 3   atorvastatin  (LIPITOR) 40 MG tablet Take 1 tablet (40 mg total) by mouth daily. 90 tablet 3   Cholecalciferol (VITAMIN D ) 2000 UNITS CAPS Take 1 capsule by mouth daily.     dextromethorphan -guaiFENesin  (TUSSIN DM) 10-100 MG/5ML liquid Take 5 mLs by mouth every 4 (four) hours as needed for cough. 240 mL 1   famotidine  (PEPCID ) 20 MG tablet Take 1 tablet (20 mg total) by mouth at bedtime. 90 tablet 2   fluticasone -salmeterol (WIXELA INHUB) 250-50 MCG/ACT AEPB ONE puff in the in morning and ONE puff in the evening 180 each 3   furosemide (LASIX) 20 MG tablet Take 20 mg by mouth as needed for edema.     gabapentin (NEURONTIN) 300 MG capsule Take 300 mg by mouth 2 (two) times daily.     guaiFENesin  (MUCINEX ) 600 MG 12 hr tablet Take 1 tablet (600 mg total) by mouth 2 (two) times daily as needed for to loosen phlegm or cough. 180 tablet 6    insulin glargine  (LANTUS ) 100 UNIT/ML injection 20 Units.     ipratropium-albuterol  (DUONEB) 0.5-2.5 (3) MG/3ML SOLN Take 3 mLs by nebulization every 6 (six) hours as needed.     JARDIANCE  10 MG TABS tablet Take 10 mg by mouth daily.     losartan  (COZAAR ) 25 MG tablet Take 25 mg by mouth daily. 1/2 tab daily     metFORMIN  (GLUCOPHAGE ) 1000 MG tablet Take 1,000 mg by mouth 2 (two) times daily.     metoprolol  succinate (TOPROL -XL) 100 MG 24 hr tablet Take 100 mg by mouth daily. Take with or immediately following a meal.     predniSONE  (DELTASONE ) 10 MG tablet 4 tabs for 2 days, then 3 tabs for 2 days, 2 tabs for 2 days, then 1 tab for 2 days, then  stop 20 tablet 0   psyllium (METAMUCIL SMOOTH TEXTURE) 58.6 % powder Take 1 packet by mouth daily. 90 packet 1   Pyridoxine HCl (VITAMIN B6 PO) Take 1 tablet by mouth daily.      ranolazine  (RANEXA ) 500 MG 12 hr tablet Take 1 tablet (500 mg total) by mouth 2 (two) times daily. 180 tablet 3   Semaglutide ,0.25 or 0.5MG /DOS, (OZEMPIC , 0.25 OR 0.5 MG/DOSE,) 2 MG/1.5ML SOPN Inject 0.5 mg into the skin once a week.     sitaGLIPtin  (JANUVIA ) 100 MG tablet Take 100 mg by mouth daily.     sucralfate  (CARAFATE ) 1 g tablet Take 1 tablet (1 g total) by mouth with breakfast, with lunch, and with evening meal. 90 tablet 0   Testosterone 20.25 MG/ACT (1.62%) GEL testosterone 20.25 mg/1.25 gram per pump act.(1.62 %) transdermal gel     Tiotropium Bromide Monohydrate  (SPIRIVA  RESPIMAT) 2.5 MCG/ACT AERS Inhale 2 puffs into the lungs daily. 4 g 11   No facility-administered medications prior to visit.   Review of Systems  Review of Systems  Constitutional: Negative.   HENT: Negative.    Respiratory:  Positive for cough.   Cardiovascular: Negative.   Psychiatric/Behavioral: Negative.     Physical Exam  There were no vitals taken for this visit. Physical Exam Constitutional:      Appearance: Normal appearance.  HENT:     Head: Normocephalic and atraumatic.   Cardiovascular:     Rate and Rhythm: Normal rate and regular rhythm.  Pulmonary:     Effort: Pulmonary effort is normal.     Breath sounds: Wheezing present. No rhonchi or rales.     Comments: Wheezing right middle lobe Skin:    General: Skin is warm and dry.  Neurological:     General: No focal deficit present.     Mental Status: He is alert and oriented to person, place, and time. Mental status is at baseline.  Psychiatric:        Mood and Affect: Mood normal.        Behavior: Behavior normal.        Thought Content: Thought content normal.        Judgment: Judgment normal.      Lab Results:  CBC    Component Value Date/Time   WBC 6.7 12/02/2023 1610   RBC 5.97 (H) 12/02/2023 1610   HGB 18.7 (H) 12/02/2023 1616   HCT 55.0 (H) 12/02/2023 1616   PLT 217 12/02/2023 1610   MCV 88.1 12/02/2023 1610   MCH 27.6 12/02/2023 1610   MCHC 31.4 12/02/2023 1610   RDW 13.5 12/02/2023 1610   LYMPHSABS 1.3 12/02/2023 1610   MONOABS 0.7 12/02/2023 1610   EOSABS 0.1 12/02/2023 1610   BASOSABS 0.0 12/02/2023 1610    BMET    Component Value Date/Time   NA 136 12/02/2023 1616   K 4.3 12/02/2023 1616   CL 99 12/02/2023 1616   CO2 23 12/02/2023 1610   GLUCOSE 149 (H) 12/02/2023 1616   BUN 14 12/02/2023 1616   CREATININE 1.20 12/02/2023 1616   CALCIUM  9.8 12/02/2023 1610   GFRNONAA >60 12/02/2023 1610   GFRAA  02/23/2009 1513    >60        The eGFR has been calculated using the MDRD equation. This calculation has not been validated in all clinical situations. eGFR's persistently <60 mL/min signify possible Chronic Kidney Disease.    BNP    Component Value Date/Time   BNP 38.4 12/02/2023 1610  ProBNP No results found for: "PROBNP"  Imaging: No results found.   Assessment & Plan:   1. COPD with chronic bronchitis and emphysema (HCC) (Primary)  2. Obstructive sleep apnea (adult) (pediatric)  3. S/P lobectomy of lung  4. Non-small cell cancer of right lung  (HCC)  Assessment and Plan    Chronic Obstructive Pulmonary Disease (COPD) COPD with significant dyspnea and wheezing. Despite regular use of inhalers and nebulizers, experiences severe dyspnea, particularly nocturnally. Oxygen saturation decreases to 87-88% without supplemental oxygen. Unable to tolerate BiPAP due to excessive coughing and congestion. Desires a portable oxygen concentrator for improved mobility. - Continue Wixela and Spiriva  inhalers. - Use nebulizers three times daily - Ensure use of home oxygen concentrator, consider relocating to reduce noise disturbance. - [facility] will not [facility] P[facility] unless travelling, patient aware and will look into self-pay options . - Not a candidate for chronic macrolide due to arrhythmia and on other QTC prolongation drugs  - Consider adding Ohtuvayre  or daliresp at follow-up   Pneumonia Recent right-sided pneumonia with slight worsening of right lower lobe airspace disease. Patient has a productive cough with orange sputum. Improvement noted with Levaquin and IV antibiotics. Plans to initiate Tussionex cough syrup. - Continue Levaquin for seven days. - Initiate Tussinx cough syrup. - Monitor response to antibiotics and follow up if symptoms worsen.  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  RLL nodule s/p radiation completed 11/2023 Hx lung cancer s/p LUL lobectomy 2013 Recent radiation therapy completed. New nodules on CT scan. Follow-up with oncology scheduled  --PET scan 11/13/24 >> Right lower lobe nodule does not show abnormal hypermetabolism,  compatible with treatment response. Evolving changes of radiation  therapy in the surrounding right lower lobe. Slight hypermetabolism associated with minimal eccentric wall thickening in the distal esophagus. Difficult to exclude early   malignancy.  --CTA 03/23/24 >> Right lower lobe and right middle lobe pneumonia. Bilateral pulmonary nodules are concerning for metastatic disease. Also noted is mediastinal and right hilar adenopathy  --Following [facility] with CT imaging in 6 months --Follow up with oncology next week for further evaluation of lung nodules.  Diabetes Mellitus Diabetes mellitus managed with Ozempic , Lantus , and Jardiance . Blood glucose levels are well-managed, though prednisone  may cause temporary increases.  - Monitor blood glucose levels, especially with prednisone  use. - Continue current diabetes medications: Ozempic , Lantus , and Jardiance .  OSA Managed at [facility], encourage patient resume CPAP use nightly   Antonio Baumgarten, NP 03/24/2024

## 2024-03-24 NOTE — Patient Instructions (Addendum)
-  CHRONIC OBSTRUCTIVE PULMONARY DISEASE (COPD): COPD is a chronic lung disease that makes it hard to breathe. You should continue using your Wixela and Spiriva  inhalers, and use your nebulizer three times daily, increasing to four or five times if your breathing worsens. Ensure you use your home oxygen concentrator and consider relocating it to reduce noise. We will explore options for a portable oxygen concentrator to help with your mobility.  -PNEUMONIA: Pneumonia is an infection that inflames the air sacs in one or both lungs. You should continue taking Levaquin for seven days and start using Tussinx cough syrup. Monitor your symptoms and follow up if they worsen.  -RADIATION FIBROSIS: Radiation fibrosis is scarring of the lung tissue following radiation therapy. We will prescribe prednisone  to help manage the inflammation and associated symptoms. We will put you on longer steroid taper for this.   -LUNG CANCER: Lung cancer is a type of cancer that begins in the lungs. You have completed your recent radiation therapy, and we will follow up with oncology next week to evaluate the new nodules on your PET scan.  -DIABETES MELLITUS: Diabetes mellitus is a condition that affects how your body processes blood sugar. Your blood glucose levels are well-managed with your current medications: Ozempic , Lantus , and Jardiance . Monitor your blood sugar levels, especially while taking prednisone , and continue your current diabetes medications.  INSTRUCTIONS: Please follow up with oncology next week for further evaluation of your lung nodules. Continue monitoring your blood glucose levels, especially while on prednisone . If your respiratory symptoms worsen, please seek medical attention immediately.  Orders: Check on POC throught DME. Order sent in March   Follow-up Please set up visit with Dr. Baldwin Levee in 4-8 weeks (Dr. Washington Hacker on maternity leave- lung cancer)

## 2024-03-24 NOTE — Telephone Encounter (Signed)
 Please let patient know [facility] does not [facility] portable oxygen concentrator, they will provide if travelling. He can look into self pay options

## 2024-03-25 ENCOUNTER — Telehealth (HOSPITAL_BASED_OUTPATIENT_CLINIC_OR_DEPARTMENT_OTHER): Payer: Self-pay

## 2024-03-25 NOTE — Telephone Encounter (Signed)
Can we send the order?

## 2024-03-25 NOTE — Telephone Encounter (Signed)
 Pt called and stated his order for his portable oxygen was sent to [facility] [place] [place] - [address] and should be going to [facility]. Pt stated he needs a new order for portable oxygen to be sent to [facility] for continuous oxygen. Pt's phone number is [phone] ok to leave a vm.

## 2024-03-26 NOTE — Telephone Encounter (Signed)
 Yes we can send an order for POC, tammy qualified patient at her last OV. Please use that qualifying walk

## 2024-03-26 NOTE — Telephone Encounter (Signed)
 I tried calling [employer] for a fax number. I was sent to an automated vm to call back during office hours. I will try calling back Monday as Our office closes at 2

## 2024-03-26 NOTE — Telephone Encounter (Signed)
 I called and spoke to pt. Pt states if we send would send a RX to [facility] for him because he called and spoke to a representative and was told they will cover it if we send them the RX. Pt states he would also like a copy of the order sent to him for him to have. I will route to pts provider for the okay to send POC order.   [employer] fax- ?   [phone]- phone number

## 2024-03-26 NOTE — Telephone Encounter (Signed)
 Patient is calling back cause he say he will come pick up prescription for oxygen instead of having it sent to pharmacy . Did let him know the message that said they they will send prescription to pharmacy . Patient is saying he wants to come pick it up . Calling cal and they are going to check and see if that's possible . Waiting for a response on hold now . The portable oxygen concentrator

## 2024-03-26 NOTE — Telephone Encounter (Signed)
 Spoke with cal and they did check with nurse and they are going to print out the prescription for the portable oxygen concentrator for patient to pick up . Patient says he is on his way . He is not to far .

## 2024-03-26 NOTE — Telephone Encounter (Signed)
 Patient is requesting for the prescription for his portable oxygen to be sent to [facility] instead and/or [facility].  Patient states if not, he can come pick up the prescription.

## 2024-03-26 NOTE — Telephone Encounter (Signed)
 Patient picked up printed order

## 2024-03-29 NOTE — Telephone Encounter (Signed)
 [phone] CAROLINE  [phone] VICTOR  [phone] PHARMACY  I have called [employer] 6 times. I have spoke to a Deadra Everts and Charlcie Conger who would not give me a fax number. I call the pharmacy who transferred me to [facility]. After a breif call will Royston Cornea, I have received the fax number. I will fax over the POC order and complete note.   Fax- [phone]

## 2024-04-01 ENCOUNTER — Emergency Department (HOSPITAL_COMMUNITY)

## 2024-04-01 ENCOUNTER — Ambulatory Visit (HOSPITAL_BASED_OUTPATIENT_CLINIC_OR_DEPARTMENT_OTHER): Payer: Self-pay | Admitting: Pulmonary Disease

## 2024-04-01 ENCOUNTER — Inpatient Hospital Stay (HOSPITAL_COMMUNITY)
Admission: EM | Admit: 2024-04-01 | Discharge: 2024-04-07 | DRG: 190 | Disposition: A | Discharge status: Home / Self Care | Attending: Internal Medicine | Admitting: Internal Medicine

## 2024-04-01 ENCOUNTER — Ambulatory Visit (INDEPENDENT_AMBULATORY_CARE_PROVIDER_SITE_OTHER): Admitting: Pulmonary Disease

## 2024-04-01 ENCOUNTER — Encounter: Payer: Self-pay | Admitting: Pulmonary Disease

## 2024-04-01 ENCOUNTER — Other Ambulatory Visit: Payer: Self-pay

## 2024-04-01 VITALS — BP 114/58 | HR 78 | Ht 68.0 in | Wt 237.0 lb

## 2024-04-01 DIAGNOSIS — J9611 Chronic respiratory failure with hypoxia: Secondary | ICD-10-CM | POA: Diagnosis present

## 2024-04-01 DIAGNOSIS — Z7722 Contact with and (suspected) exposure to environmental tobacco smoke (acute) (chronic): Secondary | ICD-10-CM | POA: Diagnosis not present

## 2024-04-01 DIAGNOSIS — E66812 Obesity, class 2: Secondary | ICD-10-CM | POA: Diagnosis present

## 2024-04-01 DIAGNOSIS — J439 Emphysema, unspecified: Secondary | ICD-10-CM | POA: Diagnosis present

## 2024-04-01 DIAGNOSIS — Z7984 Long term (current) use of oral hypoglycemic drugs: Secondary | ICD-10-CM

## 2024-04-01 DIAGNOSIS — E1165 Type 2 diabetes mellitus with hyperglycemia: Secondary | ICD-10-CM | POA: Diagnosis not present

## 2024-04-01 DIAGNOSIS — I10 Essential (primary) hypertension: Secondary | ICD-10-CM | POA: Diagnosis present

## 2024-04-01 DIAGNOSIS — K219 Gastro-esophageal reflux disease without esophagitis: Secondary | ICD-10-CM | POA: Diagnosis present

## 2024-04-01 DIAGNOSIS — Z885 Allergy status to narcotic agent status: Secondary | ICD-10-CM

## 2024-04-01 DIAGNOSIS — I2510 Atherosclerotic heart disease of native coronary artery without angina pectoris: Secondary | ICD-10-CM | POA: Diagnosis present

## 2024-04-01 DIAGNOSIS — N400 Enlarged prostate without lower urinary tract symptoms: Secondary | ICD-10-CM | POA: Diagnosis present

## 2024-04-01 DIAGNOSIS — Z794 Long term (current) use of insulin: Secondary | ICD-10-CM

## 2024-04-01 DIAGNOSIS — Z7982 Long term (current) use of aspirin: Secondary | ICD-10-CM | POA: Diagnosis not present

## 2024-04-01 DIAGNOSIS — J189 Pneumonia, unspecified organism: Secondary | ICD-10-CM | POA: Diagnosis present

## 2024-04-01 DIAGNOSIS — Z85118 Personal history of other malignant neoplasm of bronchus and lung: Secondary | ICD-10-CM

## 2024-04-01 DIAGNOSIS — F319 Bipolar disorder, unspecified: Secondary | ICD-10-CM | POA: Diagnosis present

## 2024-04-01 DIAGNOSIS — G4733 Obstructive sleep apnea (adult) (pediatric): Secondary | ICD-10-CM | POA: Diagnosis present

## 2024-04-01 DIAGNOSIS — Z1152 Encounter for screening for COVID-19: Secondary | ICD-10-CM | POA: Diagnosis not present

## 2024-04-01 DIAGNOSIS — J440 Chronic obstructive pulmonary disease with acute lower respiratory infection: Secondary | ICD-10-CM | POA: Diagnosis present

## 2024-04-01 DIAGNOSIS — Z902 Acquired absence of lung [part of]: Secondary | ICD-10-CM

## 2024-04-01 DIAGNOSIS — J701 Chronic and other pulmonary manifestations due to radiation: Secondary | ICD-10-CM | POA: Diagnosis present

## 2024-04-01 DIAGNOSIS — E785 Hyperlipidemia, unspecified: Secondary | ICD-10-CM | POA: Diagnosis present

## 2024-04-01 DIAGNOSIS — Z923 Personal history of irradiation: Secondary | ICD-10-CM | POA: Diagnosis not present

## 2024-04-01 DIAGNOSIS — E1140 Type 2 diabetes mellitus with diabetic neuropathy, unspecified: Secondary | ICD-10-CM | POA: Diagnosis present

## 2024-04-01 DIAGNOSIS — Z6836 Body mass index (BMI) 36.0-36.9, adult: Secondary | ICD-10-CM

## 2024-04-01 DIAGNOSIS — Z7985 Long-term (current) use of injectable non-insulin antidiabetic drugs: Secondary | ICD-10-CM | POA: Diagnosis not present

## 2024-04-01 DIAGNOSIS — Y842 Radiological procedure and radiotherapy as the cause of abnormal reaction of the patient, or of later complication, without mention of misadventure at the time of the procedure: Secondary | ICD-10-CM | POA: Diagnosis present

## 2024-04-01 DIAGNOSIS — Z7952 Long term (current) use of systemic steroids: Secondary | ICD-10-CM

## 2024-04-01 DIAGNOSIS — Z87891 Personal history of nicotine dependence: Secondary | ICD-10-CM

## 2024-04-01 DIAGNOSIS — J441 Chronic obstructive pulmonary disease with (acute) exacerbation: Principal | ICD-10-CM | POA: Diagnosis present

## 2024-04-01 DIAGNOSIS — F4310 Post-traumatic stress disorder, unspecified: Secondary | ICD-10-CM | POA: Diagnosis present

## 2024-04-01 DIAGNOSIS — J9610 Chronic respiratory failure, unspecified whether with hypoxia or hypercapnia: Secondary | ICD-10-CM

## 2024-04-01 DIAGNOSIS — Z79899 Other long term (current) drug therapy: Secondary | ICD-10-CM

## 2024-04-01 DIAGNOSIS — Z888 Allergy status to other drugs, medicaments and biological substances status: Secondary | ICD-10-CM

## 2024-04-01 LAB — COMPREHENSIVE METABOLIC PANEL WITH GFR
ALT: 28 U/L (ref 0–44)
AST: 18 U/L (ref 15–41)
Albumin: 2.9 g/dL — ABNORMAL LOW (ref 3.5–5.0)
Alkaline Phosphatase: 58 U/L (ref 38–126)
Anion gap: 8 (ref 5–15)
BUN: 20 mg/dL (ref 8–23)
CO2: 25 mmol/L (ref 22–32)
Calcium: 9.1 mg/dL (ref 8.9–10.3)
Chloride: 104 mmol/L (ref 98–111)
Creatinine, Ser: 1.07 mg/dL (ref 0.61–1.24)
GFR, Estimated: >60 mL/min (ref >60)
Glucose, Bld: 176 mg/dL — ABNORMAL HIGH (ref 70–99)
Potassium: 3.8 mmol/L (ref 3.5–5.1)
Sodium: 137 mmol/L (ref 135–145)
Total Bilirubin: 0.8 mg/dL (ref 0.0–1.2)
Total Protein: 5.8 g/dL — ABNORMAL LOW (ref 6.5–8.1)

## 2024-04-01 LAB — PROTIME-INR
INR: 1 (ref 0.8–1.2)
Prothrombin Time: 13.5 s (ref 11.4–15.2)

## 2024-04-01 LAB — CBC WITH DIFFERENTIAL/PLATELET
Abs Immature Granulocytes: 0.03 10*3/uL (ref 0.00–0.07)
Basophils Absolute: 0 10*3/uL (ref 0.0–0.1)
Basophils Relative: 0 %
Eosinophils Absolute: 0.4 10*3/uL (ref 0.0–0.5)
Eosinophils Relative: 5 %
HCT: 49.3 % (ref 39.0–52.0)
Hemoglobin: 15.8 g/dL (ref 13.0–17.0)
Immature Granulocytes: 0 %
Lymphocytes Relative: 21 %
Lymphs Abs: 1.9 10*3/uL (ref 0.7–4.0)
MCH: 26.2 pg (ref 26.0–34.0)
MCHC: 32 g/dL (ref 30.0–36.0)
MCV: 81.8 fL (ref 80.0–100.0)
Monocytes Absolute: 0.8 10*3/uL (ref 0.1–1.0)
Monocytes Relative: 8 %
Neutro Abs: 5.9 10*3/uL (ref 1.7–7.7)
Neutrophils Relative %: 66 %
Platelets: 270 10*3/uL (ref 150–400)
RBC: 6.03 MIL/uL — ABNORMAL HIGH (ref 4.22–5.81)
RDW: 15.2 % (ref 11.5–15.5)
WBC: 9.1 10*3/uL (ref 4.0–10.5)
nRBC: 0 % (ref 0.0–0.2)

## 2024-04-01 LAB — RESP PANEL BY RT-PCR (RSV, FLU A&B, COVID)  RVPGX2
Influenza A by PCR: NEGATIVE
Influenza B by PCR: NEGATIVE
Resp Syncytial Virus by PCR: NEGATIVE
SARS Coronavirus 2 by RT PCR: NEGATIVE

## 2024-04-01 LAB — I-STAT CG4 LACTIC ACID, ED: Lactic Acid, Venous: 1.3 mmol/L (ref 0.5–1.9)

## 2024-04-01 MED ORDER — IPRATROPIUM-ALBUTEROL 0.5-2.5 (3) MG/3ML IN SOLN
3.0000 mL | Freq: Once | RESPIRATORY_TRACT | Status: AC
  Administered 2024-04-01: 3 mL via RESPIRATORY_TRACT
  Filled 2024-04-01: qty 3

## 2024-04-01 MED ORDER — UMECLIDINIUM BROMIDE 62.5 MCG/ACT IN AEPB
1.0000 | INHALATION_SPRAY | Freq: Every day | RESPIRATORY_TRACT | Status: DC
  Administered 2024-04-02: 1 via RESPIRATORY_TRACT
  Filled 2024-04-01: qty 7

## 2024-04-01 MED ORDER — SODIUM CHLORIDE 0.9 % IV SOLN
500.0000 mg | Freq: Once | INTRAVENOUS | Status: AC
  Administered 2024-04-01: 500 mg via INTRAVENOUS
  Filled 2024-04-01: qty 5

## 2024-04-01 MED ORDER — ACETAMINOPHEN 325 MG PO TABS: 650.0000 mg | ORAL_TABLET | Freq: Four times a day (QID) | ORAL | Status: DC | PRN

## 2024-04-01 MED ORDER — SENNOSIDES-DOCUSATE SODIUM 8.6-50 MG PO TABS: 1.0000 | ORAL_TABLET | Freq: Every evening | ORAL | Status: DC | PRN

## 2024-04-01 MED ORDER — LACTATED RINGERS IV SOLN: INTRAVENOUS | Status: DC

## 2024-04-01 MED ORDER — BENZONATATE 100 MG PO CAPS
200.0000 mg | ORAL_CAPSULE | Freq: Three times a day (TID) | ORAL | Status: DC | PRN
  Administered 2024-04-07 (×2): 200 mg via ORAL
  Filled 2024-04-01 (×2): qty 2

## 2024-04-01 MED ORDER — ONDANSETRON HCL 4 MG PO TABS: 4.0000 mg | ORAL_TABLET | Freq: Four times a day (QID) | ORAL | Status: DC | PRN

## 2024-04-01 MED ORDER — FLUTICASONE FUROATE-VILANTEROL 200-25 MCG/ACT IN AEPB
1.0000 | INHALATION_SPRAY | Freq: Every day | RESPIRATORY_TRACT | Status: DC
  Administered 2024-04-02: 1 via RESPIRATORY_TRACT
  Filled 2024-04-01: qty 28

## 2024-04-01 MED ORDER — ACETAMINOPHEN 650 MG RE SUPP: 650.0000 mg | Freq: Four times a day (QID) | RECTAL | Status: DC | PRN

## 2024-04-01 MED ORDER — TIOTROPIUM BROMIDE MONOHYDRATE 2.5 MCG/ACT IN AERS: 2.0000 | INHALATION_SPRAY | Freq: Every day | RESPIRATORY_TRACT | Status: DC

## 2024-04-01 MED ORDER — SODIUM CHLORIDE 0.9 % IV SOLN
2.0000 g | Freq: Once | INTRAVENOUS | Status: AC
  Administered 2024-04-01: 2 g via INTRAVENOUS
  Filled 2024-04-01: qty 20

## 2024-04-01 MED ORDER — ALUM & MAG HYDROXIDE-SIMETH 200-200-20 MG/5ML PO SUSP
30.0000 mL | Freq: Once | ORAL | Status: AC
  Administered 2024-04-01: 30 mL via ORAL
  Filled 2024-04-01: qty 30

## 2024-04-01 MED ORDER — IOHEXOL 350 MG/ML SOLN
75.0000 mL | Freq: Once | INTRAVENOUS | Status: AC | PRN
  Administered 2024-04-01: 75 mL via INTRAVENOUS

## 2024-04-01 MED ORDER — ENOXAPARIN SODIUM 40 MG/0.4ML IJ SOSY
40.0000 mg | PREFILLED_SYRINGE | INTRAMUSCULAR | Status: DC
  Administered 2024-04-02 – 2024-04-07 (×6): 40 mg via SUBCUTANEOUS
  Filled 2024-04-01 (×6): qty 0.4

## 2024-04-01 MED ORDER — IPRATROPIUM-ALBUTEROL 0.5-2.5 (3) MG/3ML IN SOLN: 3.0000 mL | Freq: Four times a day (QID) | RESPIRATORY_TRACT | Status: DC

## 2024-04-01 MED ORDER — METHYLPREDNISOLONE SODIUM SUCC 125 MG IJ SOLR
125.0000 mg | Freq: Once | INTRAMUSCULAR | Status: AC
  Administered 2024-04-02: 125 mg via INTRAVENOUS
  Filled 2024-04-01: qty 2

## 2024-04-01 MED ORDER — ONDANSETRON HCL 4 MG/2ML IJ SOLN: 4.0000 mg | Freq: Four times a day (QID) | INTRAMUSCULAR | Status: DC | PRN

## 2024-04-01 MED ORDER — DM-GUAIFENESIN ER 30-600 MG PO TB12
2.0000 | ORAL_TABLET | Freq: Two times a day (BID) | ORAL | Status: DC
Start: 2024-04-01 — End: 2024-04-07
  Administered 2024-04-02 – 2024-04-07 (×12): 2 via ORAL
  Filled 2024-04-01 (×12): qty 2

## 2024-04-01 NOTE — Progress Notes (Signed)
 Elink monitoring for the code sepsis protocol.

## 2024-04-01 NOTE — Telephone Encounter (Signed)
 This sounds like he should go to ED he just was seen in clinic and was on oral antibiotics.

## 2024-04-01 NOTE — Telephone Encounter (Signed)
 FYI pt has appt today with you

## 2024-04-01 NOTE — Telephone Encounter (Signed)
 Copied from CRM [id number]. Topic: Clinical - Red Word Triage >> Apr 01, 2024 10:41 AM Isabell A wrote: Red Word that prompted transfer to Nurse Triage: Patient states his cancer doctor has told him to contact his pulmonary doctor in regard to his xrays - his lungs are in the worst condition.  TRIAGE SUMMARY NOTE: Pt is poor historian. Pt reporting that he was told at cancer doc appt today that his x-rays show his lungs are worse than they were when he was hospitalized and, per pt, he was told to see if he can get it with pulm today and if not, go to ED. Pt confirms that he is more SOB all the time "cannot even come off of oxygen," currently driving "illegally, not supposed to drive with this oxygen tank, but got no choice." Pt confirms also experiences chest pain and dizziness but states this is "normal with all these pills," and oncologist "said my heart checked out okay." Pt is speaking in phrases to full sentences, coughing fit over phone. Pt stating he is "right here at building" at [facility]. Advised pt get to hospital with symptoms and advised to go to ED since likely not able to be seen immediately or in next couple hours at pulm. Pt states not going to hospital yet, need to see if can be seen with pulm. Connected to [facility], alerted front desk to situation, CAL reporting that pt was examined in office just last week, merged call with pt, advised pt go to ED or call 911 if worsening, advised have someone else drive him or ambulance, pt verbalized understanding. CAL scheduled pt appt at [facility] office since no availability at [facility].  E2C2 Pulmonary Triage - Initial Assessment Questions "Chief Complaint (e.g., cough, sob, wheezing, fever, chills, sweat or additional symptoms) *Go to specific symptom protocol after initial questions. Contact pulmonologist and get to ED right away, x-rays lungs worse than they were when hospitalized See my pulmonologist and if can't then go to ED Cannot  even come off of oxygen Chest pain and dizziness, normal with all these pills I take, oncologist said my heart checked out okay Right here at building  Reason for Disposition  Difficulty breathing    Completed protocol with limited info provided by pt  Answer Assessment - Initial Assessment Questions 6. CARDIAC HISTORY: "Do you have any history of heart disease?" (e.g., heart attack, angina, bypass surgery, angioplasty)      HTN 7. LUNG HISTORY: "Do you have any history of lung disease?"  (e.g., pulmonary embolus, asthma, emphysema)     significant  Protocols used: Breathing Difficulty-A-AH, Chest Pain-A-AH

## 2024-04-01 NOTE — ED Triage Notes (Signed)
 Pt sent by PCP for Pneumonia that has been going on for 3 months. Pt states he is SHOB, is on continuous O2 [place], and has been very fatigued.

## 2024-04-01 NOTE — Patient Instructions (Signed)
 I am sorry you are not feeling well  I recommend you go to the emergency room for further evaluation  Tell them your cancer doctor and your lung doctor told him to come to the emergency room to be admitted to the hospital  You have been treated with oral antibiotics for 2 weeks and you are worse  Your sleepiness makes me worry about developing sepsis

## 2024-04-01 NOTE — ED Provider Notes (Signed)
 [place] EMERGENCY DEPARTMENT AT [facility] Provider Note   CSN: [id number] Arrival date & time: 04/01/24  2000     History  Chief Complaint  Patient presents with   Fatigue   Shortness of Breath    Jose Patrick is a 73 y.o. male.  73 year old male with past medical history of lung cancer and hypertension presenting to the emergency department today with worsening cough and shortness of breath.  The patient has been treated with Augmentin  as well as Levaquin for this.  He was sent in today for worsening symptoms.  The patient states that he is normally on 2 L nasal cannula.  States that he has had to increase this at home due to shortness of breath to 3 L.  He does get very short of breath with very minimal exertion.  Denies any associated chest pain.  He denies a history of DVT or pulmonary embolism, recent surgeries, recent travel.  Denies any leg pain or swelling.   Shortness of Breath      Home Medications Prior to Admission medications   Medication Sig Start Date End Date Taking? Authorizing Provider  albuterol  (VENTOLIN  HFA) 108 (90 Base) MCG/ACT inhaler Inhale 2 puffs into the lungs every 6 (six) hours as needed for wheezing or shortness of breath. 03/19/22   Quillian Brunt, MD  amLODipine  (NORVASC ) 10 MG tablet Take 10 mg by mouth every evening. 11/23/21   [provider]  aspirin  81 MG chewable tablet Chew 1 tablet (81 mg total) by mouth daily. 12/19/11   Roslyn Coombe, MD  atorvastatin  (LIPITOR) 40 MG tablet Take 1 tablet (40 mg total) by mouth daily. 08/04/23   Thukkani, Arun K, MD  Cholecalciferol (VITAMIN D ) 2000 UNITS CAPS Take 1 capsule by mouth daily.    [provider]  dextromethorphan -guaiFENesin  (TUSSIN DM) 10-100 MG/5ML liquid Take 5 mLs by mouth every 4 (four) hours as needed for cough. 03/19/24   Antonio Baumgarten, NP  famotidine  (PEPCID ) 20 MG tablet Take 1 tablet (20 mg total) by mouth at bedtime. 02/11/22 03/13/22  Marnee Sink, MD  fluticasone -salmeterol Zachery Hermes INHUB) 250-50 MCG/ACT AEPB ONE puff in the in morning and ONE puff in the evening 01/14/24   Quillian Brunt, MD  furosemide (LASIX) 20 MG tablet Take 20 mg by mouth as needed for edema.    [provider]  gabapentin (NEURONTIN) 300 MG capsule Take 300 mg by mouth 2 (two) times daily.    [provider]  guaiFENesin  (MUCINEX ) 600 MG 12 hr tablet Take 1 tablet (600 mg total) by mouth 2 (two) times daily as needed for to loosen phlegm or cough. 03/20/22   Quillian Brunt, MD  insulin glargine  (LANTUS ) 100 UNIT/ML injection 20 Units.    [provider]  ipratropium-albuterol  (DUONEB) 0.5-2.5 (3) MG/3ML SOLN Take 3 mLs by nebulization every 6 (six) hours as needed. 02/09/24   Parrett, Macdonald Savoy, NP  JARDIANCE  10 MG TABS tablet Take 10 mg by mouth daily. 02/23/21   [provider]  losartan  (COZAAR ) 25 MG tablet Take 25 mg by mouth daily. 1/2 tab daily 01/01/23   [provider]  metFORMIN  (GLUCOPHAGE ) 1000 MG tablet Take 1,000 mg by mouth 2 (two) times daily. 09/14/21   [provider]  metoprolol  succinate (TOPROL -XL) 100 MG 24 hr tablet Take 100 mg by mouth daily. Take with or immediately following a meal.    [provider]  predniSONE  (DELTASONE ) 10 MG  tablet Take 4 tablets (40 mg total) by mouth daily with breakfast for 5 days, THEN 3 tablets (30 mg total) daily with breakfast for 5 days, THEN 2 tablets (20 mg total) daily with breakfast for 5 days, THEN 1 tablet (10 mg total) daily with breakfast for 5 days. 4 tabs for  days, then 3 tabs for 2 days, 2 tabs for 2 days, then 1 tab for 2 days, then stop. 03/24/24 04/13/24  Antonio Baumgarten, NP  psyllium (METAMUCIL SMOOTH TEXTURE) 58.6 % powder Take 1 packet by mouth daily. 12/20/21   Marnee Sink, MD  Pyridoxine HCl (VITAMIN B6 PO) Take 1 tablet by mouth daily.     [provider]  ranolazine  (RANEXA ) 500 MG 12 hr tablet Take 1 tablet (500 mg  total) by mouth 2 (two) times daily. 02/11/23   Sonny Dust, MD  Semaglutide ,0.25 or 0.5MG /DOS, (OZEMPIC , 0.25 OR 0.5 MG/DOSE,) 2 MG/1.5ML SOPN Inject 0.5 mg into the skin once a week.    [provider]  sitaGLIPtin  (JANUVIA ) 100 MG tablet Take 100 mg by mouth daily.    [provider]  sucralfate  (CARAFATE ) 1 g tablet Take 1 tablet (1 g total) by mouth with breakfast, with lunch, and with evening meal. 12/02/23 01/01/24  Merdis Stalling, MD  Testosterone 20.25 MG/ACT (1.62%) GEL testosterone 20.25 mg/1.25 gram per pump act.(1.62 %) transdermal gel 12/24/21   [provider]  Tiotropium Bromide Monohydrate  (SPIRIVA  RESPIMAT) 2.5 MCG/ACT AERS Inhale 2 puffs into the lungs daily. 01/14/24   Quillian Brunt, MD      Allergies    Chlorzoxazone    Review of Systems   Review of Systems  Respiratory:  Positive for shortness of breath.   All other systems reviewed and are negative.   Physical Exam Updated Vital Signs BP 120/63   Pulse 79   Temp 98.3 F (36.8 C) (Oral)   Resp 18   SpO2 93%  Physical Exam Vitals and nursing note reviewed.   Gen: NAD, chronically appearing, mild conversational dyspnea noted Eyes: PERRL, EOMI HEENT: no oropharyngeal swelling Neck: trachea midline Resp: Coarse breath sounds throughout right lung field Card: RRR, no murmurs, rubs, or gallops Abd: nontender, nondistended Extremities: no calf tenderness, no edema Vascular: 2+ radial pulses bilaterally, 2+ DP pulses bilaterally Skin: no rashes Psyc: acting appropriately   ED Results / Procedures / Treatments   Labs (all labs ordered are listed, but only abnormal results are displayed) Labs Reviewed  COMPREHENSIVE METABOLIC PANEL WITH GFR - Abnormal; Notable for the following components:      Result Value   Glucose, Bld 176 (*)    Total Protein 5.8 (*)    Albumin 2.9 (*)    All other components within normal limits  CBC WITH DIFFERENTIAL/PLATELET - Abnormal; Notable  for the following components:   RBC 6.03 (*)    All other components within normal limits  RESP PANEL BY RT-PCR (RSV, FLU A&B, COVID)  [id number]  CULTURE, BLOOD (ROUTINE X 2)  CULTURE, BLOOD (ROUTINE X 2)  PROTIME-INR  URINALYSIS, W/ REFLEX TO CULTURE (INFECTION SUSPECTED)  I-STAT CG4 LACTIC ACID, ED  I-STAT CG4 LACTIC ACID, ED    EKG EKG Interpretation Date/Time:  Thursday Apr 01 2024 21:12:55 EDT Ventricular Rate:  75 PR Interval:  166 QRS Duration:  87 QT Interval:  371 QTC Calculation: 415 R Axis:   -16  Text Interpretation: Sinus rhythm Probable left atrial enlargement Borderline left axis deviation Confirmed by Abner Hoffman (  [id number]) on 04/01/2024 9:16:24 PM  Radiology CT Angio Chest PE W and/or Wo Contrast Result Date: 04/01/2024 CLINICAL DATA:  Pulmonary embolism (PE) suspected, high prob. Pneumonia, shortness of breath. EXAM: CT ANGIOGRAPHY CHEST WITH CONTRAST TECHNIQUE: Multidetector CT imaging of the chest was performed using the standard protocol during bolus administration of intravenous contrast. Multiplanar CT image reconstructions and MIPs were obtained to evaluate the vascular anatomy. RADIATION DOSE REDUCTION: This exam was performed according to the departmental dose-optimization program which includes automated exposure control, adjustment of the mA and/or kV according to patient size and/or use of iterative reconstruction technique. CONTRAST:  75mL OMNIPAQUE  IOHEXOL  350 MG/ML SOLN COMPARISON:  12/02/2023 FINDINGS: Cardiovascular: No filling defects in the pulmonary arteries to suggest pulmonary emboli. Heart is normal size. Aorta is normal caliber. Scattered coronary artery and aortic calcifications. Mediastinum/Nodes: Mildly enlarged right paratracheal lymph node measuring 11 mm, likely reactive. No axillary or hilar adenopathy. Trachea and esophagus are unremarkable. Thyroid unremarkable. Lungs/Pleura: Emphysema. Dense consolidation throughout the right lower lobe and in the  inferior right upper lobe, progressed since prior study concerning for pneumonia. Numerous small nodules throughout the left lung, new since prior study. These are all 5 mm or less in size. No effusions. Upper Abdomen: No acute findings Musculoskeletal: Chest wall soft tissues are unremarkable. No acute bony abnormality. Review of the MIP images confirms the above findings. IMPRESSION: No evidence of pulmonary embolus. Dense consolidation in the right lower lobe and inferior right upper lobe concerning for pneumonia. Numerous scattered small nodules throughout the left lung, 5 mm or less in size, new since prior study. Recommend follow-up CT in 3 3-6 months to assess stability. Coronary artery disease. Aortic Atherosclerosis (ICD10-I70.0) and Emphysema (ICD10-J43.9). Electronically Signed   By: Janeece Mechanic M.D.   On: 04/01/2024 23:06   DG Chest Port 1 View Result Date: 04/01/2024 CLINICAL DATA:  Questionable sepsis - evaluate for abnormality. History of pneumonia. Shortness of breath. EXAM: PORTABLE CHEST 1 VIEW COMPARISON:  12/02/2023 FINDINGS: Diffuse bilateral airspace disease, worsening since prior study. These are most pronounced in the lower lobes. Heart is borderline in size. No definite effusions. No pneumothorax. No acute bony abnormality. IMPRESSION: Worsening bilateral airspace disease, most pronounced in the lower lobes. Findings could reflect edema or infection. Electronically Signed   By: Janeece Mechanic M.D.   On: 04/01/2024 22:15    Procedures Procedures    Medications Ordered in ED Medications  lactated ringers infusion ( Intravenous New Bag/Given 04/01/24 2147)  cefTRIAXone (ROCEPHIN) 2 g in sodium chloride  0.9 % 100 mL IVPB (0 g Intravenous Stopped 04/01/24 2210)  azithromycin  (ZITHROMAX ) 500 mg in sodium chloride  0.9 % 250 mL IVPB (500 mg Intravenous New Bag/Given 04/01/24 2130)  ipratropium-albuterol  (DUONEB) 0.5-2.5 (3) MG/3ML nebulizer solution 3 mL (3 mLs Nebulization Given 04/01/24  2115)  ipratropium-albuterol  (DUONEB) 0.5-2.5 (3) MG/3ML nebulizer solution 3 mL (3 mLs Nebulization Given 04/01/24 2138)  iohexol  (OMNIPAQUE ) 350 MG/ML injection 75 mL (75 mLs Intravenous Contrast Given 04/01/24 2230)  alum & mag hydroxide-simeth (MAALOX/MYLANTA) 200-200-20 MG/5ML suspension 30 mL (30 mLs Oral Given 04/01/24 2258)    ED Course/ Medical Decision Making/ A&P                                 Medical Decision Making 73 year old male with past medical history of lung cancer and hypertension presenting to the emergency department today with persistent cough and shortness of breath.  I  will further evaluate the patient here with a sepsis workup.  Will give him a DuoNeb here to see if this helps with his symptoms.  Will repeat an x-ray.  If this is unrevealing we will consider CT angiogram to eval for pulmonary embolism or to further delineate symptoms.  The patient's labs are largely unremarkable.  He was covered initially with Rocephin and azithromycin .  The patient's x-ray shows edema versus worsening consolidation.  CT angiogram is performed and does show worsening consolidation.  Calls placed to hospital service for admission for IV antibiotics.  Amount and/or Complexity of Data Reviewed Labs: ordered. Radiology: ordered.  Risk OTC drugs. Prescription drug management.           Final Clinical Impression(s) / ED Diagnoses Final diagnoses:  Pneumonia due to infectious organism, unspecified laterality, unspecified part of lung    Rx / DC Orders ED Discharge Orders     None         Carin Charleston, MD 04/01/24 2315

## 2024-04-02 ENCOUNTER — Encounter (HOSPITAL_COMMUNITY): Payer: Self-pay | Admitting: Student

## 2024-04-02 DIAGNOSIS — J189 Pneumonia, unspecified organism: Secondary | ICD-10-CM | POA: Diagnosis not present

## 2024-04-02 DIAGNOSIS — J9611 Chronic respiratory failure with hypoxia: Secondary | ICD-10-CM | POA: Diagnosis not present

## 2024-04-02 LAB — CBC
HCT: 49.5 % (ref 39.0–52.0)
Hemoglobin: 15.4 g/dL (ref 13.0–17.0)
MCH: 25.8 pg — ABNORMAL LOW (ref 26.0–34.0)
MCHC: 31.1 g/dL (ref 30.0–36.0)
MCV: 82.9 fL (ref 80.0–100.0)
Platelets: 247 10*3/uL (ref 150–400)
RBC: 5.97 MIL/uL — ABNORMAL HIGH (ref 4.22–5.81)
RDW: 15.3 % (ref 11.5–15.5)
WBC: 8.1 10*3/uL (ref 4.0–10.5)
nRBC: 0 % (ref 0.0–0.2)

## 2024-04-02 LAB — RESPIRATORY PANEL BY PCR

## 2024-04-02 LAB — URINALYSIS, W/ REFLEX TO CULTURE (INFECTION SUSPECTED)
Bacteria, UA: NONE SEEN
Bilirubin Urine: NEGATIVE
Glucose, UA: >=500 mg/dL — AB
Hgb urine dipstick: NEGATIVE
Ketones, ur: NEGATIVE mg/dL
Leukocytes,Ua: NEGATIVE
Nitrite: NEGATIVE
Protein, ur: NEGATIVE mg/dL
Specific Gravity, Urine: 1.028 (ref 1.005–1.030)
pH: 5 (ref 5.0–8.0)

## 2024-04-02 LAB — BASIC METABOLIC PANEL WITH GFR
Anion gap: 7 (ref 5–15)
BUN: 20 mg/dL (ref 8–23)
CO2: 26 mmol/L (ref 22–32)
Calcium: 8.9 mg/dL (ref 8.9–10.3)
Chloride: 103 mmol/L (ref 98–111)
Creatinine, Ser: 1.09 mg/dL (ref 0.61–1.24)
GFR, Estimated: >60 mL/min (ref >60)
Glucose, Bld: 204 mg/dL — ABNORMAL HIGH (ref 70–99)
Potassium: 4.3 mmol/L (ref 3.5–5.1)
Sodium: 136 mmol/L (ref 135–145)

## 2024-04-02 LAB — EXPECTORATED SPUTUM ASSESSMENT W GRAM STAIN, RFLX TO RESP C

## 2024-04-02 LAB — STREP PNEUMONIAE URINARY ANTIGEN: Strep Pneumo Urinary Antigen: NEGATIVE

## 2024-04-02 LAB — GLUCOSE, CAPILLARY
Glucose-Capillary: 203 mg/dL — ABNORMAL HIGH (ref 70–99)
Glucose-Capillary: 238 mg/dL — ABNORMAL HIGH (ref 70–99)
Glucose-Capillary: 228 mg/dL — ABNORMAL HIGH (ref 70–99)
Glucose-Capillary: 248 mg/dL — ABNORMAL HIGH (ref 70–99)

## 2024-04-02 LAB — MRSA NEXT GEN BY PCR, NASAL: MRSA by PCR Next Gen: NOT DETECTED

## 2024-04-02 LAB — PROCALCITONIN: Procalcitonin: <0.1 ng/mL

## 2024-04-02 MED ORDER — RANOLAZINE ER 500 MG PO TB12
500.0000 mg | ORAL_TABLET | Freq: Two times a day (BID) | ORAL | Status: DC
Start: 2024-04-02 — End: 2024-04-07
  Administered 2024-04-02 – 2024-04-07 (×11): 500 mg via ORAL
  Filled 2024-04-02 (×12): qty 1

## 2024-04-02 MED ORDER — ATORVASTATIN CALCIUM 40 MG PO TABS
40.0000 mg | ORAL_TABLET | Freq: Every day | ORAL | Status: DC
  Administered 2024-04-02 – 2024-04-07 (×6): 40 mg via ORAL
  Filled 2024-04-02 (×7): qty 1

## 2024-04-02 MED ORDER — VITAMIN D 25 MCG (1000 UNIT) PO TABS
2000.0000 [IU] | ORAL_TABLET | Freq: Every day | ORAL | Status: DC
  Administered 2024-04-02 – 2024-04-07 (×6): 2000 [IU] via ORAL
  Filled 2024-04-02 (×6): qty 2

## 2024-04-02 MED ORDER — EMPAGLIFLOZIN 10 MG PO TABS
10.0000 mg | ORAL_TABLET | Freq: Every day | ORAL | Status: DC
  Administered 2024-04-02 – 2024-04-07 (×6): 10 mg via ORAL
  Filled 2024-04-02 (×6): qty 1

## 2024-04-02 MED ORDER — AMLODIPINE BESYLATE 10 MG PO TABS
10.0000 mg | ORAL_TABLET | Freq: Every evening | ORAL | Status: DC
Start: 2024-04-02 — End: 2024-04-07
  Administered 2024-04-02 – 2024-04-06 (×3): 10 mg via ORAL
  Filled 2024-04-02 (×4): qty 1

## 2024-04-02 MED ORDER — REVEFENACIN 175 MCG/3ML IN SOLN
175.0000 ug | Freq: Every day | RESPIRATORY_TRACT | Status: DC
  Administered 2024-04-03 – 2024-04-07 (×5): 175 ug via RESPIRATORY_TRACT
  Filled 2024-04-02 (×6): qty 3

## 2024-04-02 MED ORDER — FERROUS SULFATE 325 (65 FE) MG PO TBEC
325.0000 mg | DELAYED_RELEASE_TABLET | Freq: Two times a day (BID) | ORAL | Status: DC
  Filled 2024-04-02: qty 1

## 2024-04-02 MED ORDER — PIPERACILLIN-TAZOBACTAM 3.375 G IVPB
3.3750 g | Freq: Three times a day (TID) | INTRAVENOUS | Status: AC
  Administered 2024-04-02 – 2024-04-06 (×14): 3.375 g via INTRAVENOUS
  Filled 2024-04-02 (×14): qty 50

## 2024-04-02 MED ORDER — AZITHROMYCIN 250 MG PO TABS: 500.0000 mg | ORAL_TABLET | Freq: Every day | ORAL | Status: DC

## 2024-04-02 MED ORDER — ASPIRIN 81 MG PO CHEW
81.0000 mg | CHEWABLE_TABLET | Freq: Every day | ORAL | Status: DC
  Administered 2024-04-02 – 2024-04-07 (×6): 81 mg via ORAL
  Filled 2024-04-02 (×6): qty 1

## 2024-04-02 MED ORDER — LOSARTAN POTASSIUM 25 MG PO TABS
25.0000 mg | ORAL_TABLET | Freq: Every day | ORAL | Status: DC
  Administered 2024-04-02 – 2024-04-07 (×6): 25 mg via ORAL
  Filled 2024-04-02 (×6): qty 1

## 2024-04-02 MED ORDER — PREDNISONE 20 MG PO TABS
40.0000 mg | ORAL_TABLET | Freq: Every day | ORAL | Status: DC
  Filled 2024-04-02: qty 2

## 2024-04-02 MED ORDER — SODIUM CHLORIDE 0.9 % IV SOLN: 1.0000 g | INTRAVENOUS | Status: DC

## 2024-04-02 MED ORDER — EZETIMIBE 10 MG PO TABS
10.0000 mg | ORAL_TABLET | Freq: Every day | ORAL | Status: DC
  Administered 2024-04-02 – 2024-04-07 (×6): 10 mg via ORAL
  Filled 2024-04-02 (×6): qty 1

## 2024-04-02 MED ORDER — INSULIN ASPART 100 UNIT/ML IJ SOLN
0.0000 [IU] | Freq: Every day | INTRAMUSCULAR | Status: DC
  Administered 2024-04-02: 2 [IU] via SUBCUTANEOUS
  Administered 2024-04-03: 4 [IU] via SUBCUTANEOUS
  Administered 2024-04-04: 3 [IU] via SUBCUTANEOUS
  Administered 2024-04-05 – 2024-04-06 (×2): 2 [IU] via SUBCUTANEOUS

## 2024-04-02 MED ORDER — BUDESONIDE 0.25 MG/2ML IN SUSP
0.2500 mg | Freq: Two times a day (BID) | RESPIRATORY_TRACT | Status: DC
Start: 2024-04-02 — End: 2024-04-07
  Administered 2024-04-02 – 2024-04-07 (×10): 0.25 mg via RESPIRATORY_TRACT
  Filled 2024-04-02 (×11): qty 2

## 2024-04-02 MED ORDER — FAMOTIDINE 20 MG PO TABS
20.0000 mg | ORAL_TABLET | Freq: Every day | ORAL | Status: DC
Start: 2024-04-02 — End: 2024-04-07
  Administered 2024-04-02 – 2024-04-06 (×5): 20 mg via ORAL
  Filled 2024-04-02 (×5): qty 1

## 2024-04-02 MED ORDER — PANTOPRAZOLE SODIUM 40 MG PO TBEC
40.0000 mg | DELAYED_RELEASE_TABLET | Freq: Every day | ORAL | Status: DC
  Administered 2024-04-02 – 2024-04-07 (×6): 40 mg via ORAL
  Filled 2024-04-02 (×6): qty 1

## 2024-04-02 MED ORDER — PIPERACILLIN-TAZOBACTAM 3.375 G IVPB 30 MIN
3.3750 g | Freq: Once | INTRAVENOUS | Status: AC
  Administered 2024-04-02: 3.375 g via INTRAVENOUS
  Filled 2024-04-02: qty 50

## 2024-04-02 MED ORDER — GABAPENTIN 300 MG PO CAPS
300.0000 mg | ORAL_CAPSULE | Freq: Two times a day (BID) | ORAL | Status: DC
  Administered 2024-04-02 – 2024-04-07 (×11): 300 mg via ORAL
  Filled 2024-04-02 (×11): qty 1

## 2024-04-02 MED ORDER — ALBUTEROL SULFATE (2.5 MG/3ML) 0.083% IN NEBU: 2.5000 mg | INHALATION_SOLUTION | Freq: Three times a day (TID) | RESPIRATORY_TRACT | Status: DC

## 2024-04-02 MED ORDER — IPRATROPIUM-ALBUTEROL 0.5-2.5 (3) MG/3ML IN SOLN
3.0000 mL | Freq: Four times a day (QID) | RESPIRATORY_TRACT | Status: DC
  Administered 2024-04-02: 3 mL via RESPIRATORY_TRACT
  Filled 2024-04-02: qty 3

## 2024-04-02 MED ORDER — VITAMIN B6 100 MG PO TABS: ORAL_TABLET | Freq: Every day | ORAL | Status: DC

## 2024-04-02 MED ORDER — FLUTICASONE PROPIONATE 50 MCG/ACT NA SUSP
2.0000 | Freq: Every day | NASAL | Status: DC
  Administered 2024-04-02 – 2024-04-07 (×6): 2 via NASAL
  Filled 2024-04-02: qty 16

## 2024-04-02 MED ORDER — ARFORMOTEROL TARTRATE 15 MCG/2ML IN NEBU
15.0000 ug | INHALATION_SOLUTION | Freq: Two times a day (BID) | RESPIRATORY_TRACT | Status: DC
  Administered 2024-04-02 – 2024-04-07 (×10): 15 ug via RESPIRATORY_TRACT
  Filled 2024-04-02 (×11): qty 2

## 2024-04-02 MED ORDER — METOPROLOL SUCCINATE ER 50 MG PO TB24
100.0000 mg | ORAL_TABLET | Freq: Every day | ORAL | Status: DC
  Administered 2024-04-02 – 2024-04-07 (×6): 100 mg via ORAL
  Filled 2024-04-02 (×6): qty 2

## 2024-04-02 MED ORDER — INSULIN GLARGINE-YFGN 100 UNIT/ML ~~LOC~~ SOLN
20.0000 [IU] | Freq: Every day | SUBCUTANEOUS | Status: DC
  Administered 2024-04-02 – 2024-04-05 (×4): 20 [IU] via SUBCUTANEOUS
  Filled 2024-04-02 (×5): qty 0.2

## 2024-04-02 MED ORDER — PYRIDOXINE HCL 25 MG PO TABS
25.0000 mg | ORAL_TABLET | Freq: Every day | ORAL | Status: DC
  Administered 2024-04-02 – 2024-04-07 (×6): 25 mg via ORAL
  Filled 2024-04-02 (×6): qty 1

## 2024-04-02 MED ORDER — FERROUS SULFATE 325 (65 FE) MG PO TABS
325.0000 mg | ORAL_TABLET | Freq: Two times a day (BID) | ORAL | Status: DC
  Administered 2024-04-02 – 2024-04-07 (×11): 325 mg via ORAL
  Filled 2024-04-02 (×12): qty 1

## 2024-04-02 MED ORDER — METHYLPREDNISOLONE SODIUM SUCC 40 MG IJ SOLR
40.0000 mg | Freq: Two times a day (BID) | INTRAMUSCULAR | Status: DC
Start: 2024-04-02 — End: 2024-04-03
  Administered 2024-04-02 – 2024-04-03 (×3): 40 mg via INTRAVENOUS
  Filled 2024-04-02 (×3): qty 1

## 2024-04-02 MED ORDER — INSULIN ASPART 100 UNIT/ML IJ SOLN
0.0000 [IU] | Freq: Three times a day (TID) | INTRAMUSCULAR | Status: DC
  Administered 2024-04-02 (×3): 5 [IU] via SUBCUTANEOUS
  Administered 2024-04-03: 8 [IU] via SUBCUTANEOUS
  Administered 2024-04-03: 5 [IU] via SUBCUTANEOUS
  Administered 2024-04-03: 15 [IU] via SUBCUTANEOUS
  Administered 2024-04-04: 11 [IU] via SUBCUTANEOUS
  Administered 2024-04-04: 3 [IU] via SUBCUTANEOUS
  Administered 2024-04-04: 5 [IU] via SUBCUTANEOUS
  Administered 2024-04-05: 3 [IU] via SUBCUTANEOUS
  Administered 2024-04-05: 5 [IU] via SUBCUTANEOUS
  Administered 2024-04-05: 15 [IU] via SUBCUTANEOUS
  Administered 2024-04-06: 2 [IU] via SUBCUTANEOUS
  Administered 2024-04-06: 5 [IU] via SUBCUTANEOUS

## 2024-04-02 MED ORDER — IPRATROPIUM-ALBUTEROL 0.5-2.5 (3) MG/3ML IN SOLN
3.0000 mL | RESPIRATORY_TRACT | Status: DC | PRN
  Administered 2024-04-03 – 2024-04-07 (×3): 3 mL via RESPIRATORY_TRACT
  Filled 2024-04-02 (×3): qty 3

## 2024-04-02 MED ORDER — IPRATROPIUM-ALBUTEROL 0.5-2.5 (3) MG/3ML IN SOLN: 3.0000 mL | Freq: Three times a day (TID) | RESPIRATORY_TRACT | Status: DC

## 2024-04-02 NOTE — Progress Notes (Signed)
 PROGRESS NOTE    Jose Patrick  GNF:[id number] DOB: 01-Nov-1951 DOA: 04/01/2024 PCP: [name]   Brief Narrative:  73 y.o. male with medical history significant for COPD, chronic hypoxic respiratory failure on 2-3 L [place], lung cancer s/p LUL lobectomy in 2013 and presumed RLL lung cancer s/p recent radiation, OSA on BiPAP, T2DM, CAD, HTN and recent recurrent pneumonia with recent treatment of persistent respiratory symptoms with Levaquin and steroids by his pulmonologist presented with worsening shortness of breath and cough.  On presentation, he was saturating in the 90s on 2 L oxygen.  COVID/influenza/RSV PCR negative.  Chest x-ray showed worsening bilateral airspace opacities most pronounced in the lower lobes. CTA PE study negative for PE but showed right lower lobe and inferior right upper lobe pneumonia.  Patient was started on IV antibiotics.  Assessment & Plan:   Recurrent pneumonia -Patient has had recurrent pneumonia over the last few months treated with multiple rounds of antibiotics.  Was recently on Levaquin and steroids as an outpatient by his pulmonologist. - Imaging as above.  COVID/influenza/RSV PCR negative.  Respiratory virus panel PCR pending.  Procalcitonin less than 0.1.  Currently on Rocephin and Zithromax .  I have consulted pulmonary.  Follow recommendations.  COPD with acute exacerbation Chronic hypoxic respiratory failure Pulmonary fibrosis - Continue Solu-Medrol.  Continue current inhaled and nebulized regimen.  Follow pulmonary recommendations  Lung cancer - Hx of lung cancer s/p LUL lobectomy in 2013 - Presumed RLL lung cancer s/p completion of radiation therapy in January 2025 - Follows with [facility] for serial imaging  Hypertension - Blood pressure mostly stable.  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  Carb modified diet.  Metformin  on hold.  Continue gabapentin and pyridoxine  CAD Hyperlipidemia - Stable.  Continue aspirin , Ranexa , Zetia  and statin  OSA - Unable to tolerate CPAP due to feeling of suffocation  Obesity class II - Outpatient follow-up  GERD - Continue famotidine  and PPI   DVT prophylaxis: Lovenox Code Status: Full Family Communication: Wife at bedside Disposition Plan: Status is: Inpatient Remains inpatient appropriate because: Of severity of illness    Consultants: Pulmonary  Procedures: None  Antimicrobials:  Anti-infectives (From admission, onward)    Start     Dose/Rate Route Frequency Ordered Stop   04/02/24 2030  cefTRIAXone (ROCEPHIN) 1 g in sodium chloride  0.9 % 100 mL IVPB  Status:  Discontinued        1 g 200 mL/hr over 30 Minutes Intravenous Every 24 hours 04/02/24 0326 04/02/24 0326   04/02/24 2030  cefTRIAXone (ROCEPHIN) 1 g in sodium chloride  0.9 % 100 mL IVPB  Status:  Discontinued        1 g 200 mL/hr over 30 Minutes Intravenous Every 24 hours 04/02/24 0326 04/02/24 0848   04/02/24 2000  azithromycin  (ZITHROMAX ) tablet 500 mg  Status:  Discontinued        500 mg Oral Daily 04/02/24 0326 04/02/24 0848   04/01/24 2030  cefTRIAXone (ROCEPHIN) 2 g in sodium chloride  0.9 % 100 mL IVPB        2 g 200 mL/hr over 30 Minutes Intravenous Once 04/01/24 2019 04/01/24 2210   04/01/24 2030  azithromycin  (ZITHROMAX ) 500 mg in sodium chloride  0.9 % 250 mL IVPB        500 mg 250 mL/hr over 60 Minutes Intravenous  Once 04/01/24 2019 04/01/24  2328        Subjective: Patient seen and examined at bedside.  Still complains of cough with shortness of breath with exertion.  No fever, vomiting, chest pain reported.  Objective: Vitals:   04/01/24 2359 04/02/24 0200 04/02/24 0201 04/02/24 0554  BP:   131/61 (!) 152/73  Pulse:   71 63  Resp:   20 18  Temp: 97.9 F (36.6 C)  97.6 F (36.4 C) (!) 97.5 F (36.4 C)  TempSrc:  Oral   Oral  SpO2:   96% 95%  Weight:  107.7 kg    Height:  5\' 8"  (1.727 m)      Intake/Output Summary (Last 24 hours) at 04/02/2024 0842 Last data filed at 04/02/2024 0600 Gross per 24 hour  Intake 1920 ml  Output 1400 ml  Net 520 ml   Filed Weights   04/02/24 0200  Weight: 107.7 kg    Examination:  General exam: Appears calm and comfortable.  On 2 L oxygen by nasal cannula. Respiratory system: Bilateral decreased breath sounds at bases with scattered crackles Cardiovascular system: S1 & S2 heard, Rate controlled Gastrointestinal system: Abdomen is obese, nondistended, soft and nontender. Normal bowel sounds heard. Extremities: No cyanosis, clubbing, edema  Central nervous system: Alert and oriented. No focal neurological deficits. Moving extremities Skin: No rashes, lesions or ulcers Psychiatry: Judgement and insight appear normal. Mood & affect appropriate.     Data Reviewed: I have personally reviewed following labs and imaging studies  CBC: Recent Labs  Lab 04/01/24 2125 04/02/24 0337  WBC 9.1 8.1  NEUTROABS 5.9  --   HGB 15.8 15.4  HCT 49.3 49.5  MCV 81.8 82.9  PLT 270 247   Basic Metabolic Panel: Recent Labs  Lab 04/01/24 2125 04/02/24 0337  NA 137 136  K 3.8 4.3  CL 104 103  CO2 25 26  GLUCOSE 176* 204*  BUN 20 20  CREATININE 1.07 1.09  CALCIUM  9.1 8.9   GFR: Estimated Creatinine Clearance: 72.9 mL/min (by C-G formula based on SCr of 1.09 mg/dL). Liver Function Tests: Recent Labs  Lab 04/01/24 2125  AST 18  ALT 28  ALKPHOS 58  BILITOT 0.8  PROT 5.8*  ALBUMIN 2.9*   No results for input(s): "LIPASE", "AMYLASE" in the last 168 hours. No results for input(s): "AMMONIA" in the last 168 hours. Coagulation Profile: Recent Labs  Lab 04/01/24 2134  INR 1.0   Cardiac Enzymes: No results for input(s): "CKTOTAL", "CKMB", "CKMBINDEX", "TROPONINI" in the last 168 hours. BNP (last 3 results) No results for input(s): "PROBNP" in the last 8760  hours. HbA1C: No results for input(s): "HGBA1C" in the last 72 hours. CBG: No results for input(s): "GLUCAP" in the last 168 hours. Lipid Profile: No results for input(s): "CHOL", "HDL", "LDLCALC", "TRIG", "CHOLHDL", "LDLDIRECT" in the last 72 hours. Thyroid Function Tests: No results for input(s): "TSH", "T4TOTAL", "FREET4", "T3FREE", "THYROIDAB" in the last 72 hours. Anemia Panel: No results for input(s): "VITAMINB12", "FOLATE", "FERRITIN", "TIBC", "IRON", "RETICCTPCT" in the last 72 hours. Sepsis Labs: Recent Labs  Lab 04/01/24 2133 04/02/24 0329  PROCALCITON  --  <0.10  LATICACIDVEN 1.3  --     Recent Results (from the past 240 hours)  Resp panel by RT-PCR (RSV, Flu A&B, Covid) Anterior Nasal Swab     Status: None   Collection Time: 04/01/24  9:13 PM   Specimen: Anterior Nasal Swab  Result Value Ref Range Status   SARS Coronavirus 2 by RT PCR NEGATIVE NEGATIVE Final  Comment: (NOTE) SARS-CoV-2 target nucleic acids are NOT DETECTED.  The SARS-CoV-2 RNA is generally detectable in upper respiratory specimens during the acute phase of infection. The lowest concentration of SARS-CoV-2 viral copies this assay can detect is 138 copies/mL. A negative result does not preclude SARS-Cov-2 infection and should not be used as the sole basis for treatment or other patient management decisions. A negative result may occur with  improper specimen collection/handling, submission of specimen other than nasopharyngeal swab, presence of viral mutation(s) within the areas targeted by this assay, and inadequate number of viral copies(<138 copies/mL). A negative result must be combined with clinical observations, patient history, and epidemiological information. The expected result is Negative.  Fact Sheet for Patients:  BloggerCourse.com  Fact Sheet for Healthcare Providers:  SeriousBroker.it  This test is no t yet approved or cleared  by the [place]  FDA and  has been authorized for detection and/or diagnosis of SARS-CoV-2 by FDA under an Emergency Use Authorization (EUA). This EUA will remain  in effect (meaning this test can be used) for the duration of the COVID-19 declaration under Section 564(b)(1) of the Act, 21 U.S.C.section 360bbb-3(b)(1), unless the authorization is terminated  or revoked sooner.       Influenza A by PCR NEGATIVE NEGATIVE Final   Influenza B by PCR NEGATIVE NEGATIVE Final    Comment: (NOTE) The Xpert Xpress SARS-CoV-2/FLU/RSV plus assay is intended as an aid in the diagnosis of influenza from Nasopharyngeal swab specimens and should not be used as a sole basis for treatment. Nasal washings and aspirates are unacceptable for Xpert Xpress SARS-CoV-2/FLU/RSV testing.  Fact Sheet for Patients: BloggerCourse.com  Fact Sheet for Healthcare Providers: SeriousBroker.it  This test is not yet approved or cleared by the [place]  FDA and has been authorized for detection and/or diagnosis of SARS-CoV-2 by FDA under an Emergency Use Authorization (EUA). This EUA will remain in effect (meaning this test can be used) for the duration of the COVID-19 declaration under Section 564(b)(1) of the Act, 21 U.S.C. section 360bbb-3(b)(1), unless the authorization is terminated or revoked.     Resp Syncytial Virus by PCR NEGATIVE NEGATIVE Final    Comment: (NOTE) Fact Sheet for Patients: BloggerCourse.com  Fact Sheet for Healthcare Providers: SeriousBroker.it  This test is not yet approved or cleared by the [place]  FDA and has been authorized for detection and/or diagnosis of SARS-CoV-2 by FDA under an Emergency Use Authorization (EUA). This EUA will remain in effect (meaning this test can be used) for the duration of the COVID-19 declaration under Section 564(b)(1) of the Act, 21  U.S.C. section 360bbb-3(b)(1), unless the authorization is terminated or revoked.  Performed at [facility], [address], [place], [place] [address]   Blood Culture (routine x 2)     Status: None (Preliminary result)   Collection Time: 04/01/24  9:58 PM   Specimen: BLOOD RIGHT HAND  Result Value Ref Range Status   Specimen Description   Final    BLOOD RIGHT HAND Performed at [facility], [address], [place], [place] [address]    Special Requests   Final    BOTTLES DRAWN AEROBIC AND ANAEROBIC Blood Culture results may not be optimal due to an inadequate volume of blood received in culture bottles Performed at [facility], [address], [place], [place] [address]    Culture PENDING  Incomplete   Report Status PENDING  Incomplete  MRSA Next Gen by PCR, Nasal  Status: None   Collection Time: 04/02/24  6:10 AM   Specimen: Nasopharyngeal Swab; Nasal Swab  Result Value Ref Range Status   MRSA by PCR Next Gen NOT DETECTED NOT DETECTED Final    Comment: (NOTE) The GeneXpert MRSA Assay (FDA approved for NASAL specimens only), is one component of a comprehensive MRSA colonization surveillance program. It is not intended to diagnose MRSA infection nor to guide or monitor treatment for MRSA infections. Test performance is not FDA approved in patients less than 53 years old. Performed at [facility], [address] [place], [place] [address]          Radiology Studies: CT Angio Chest PE W and/or Wo Contrast Result Date: 04/01/2024 CLINICAL DATA:  Pulmonary embolism (PE) suspected, high prob. Pneumonia, shortness of breath. EXAM: CT ANGIOGRAPHY CHEST WITH CONTRAST TECHNIQUE: Multidetector CT imaging of the chest was performed using the standard protocol during bolus administration of intravenous contrast. Multiplanar CT image reconstructions and MIPs were obtained to evaluate the vascular anatomy. RADIATION  DOSE REDUCTION: This exam was performed according to the departmental dose-optimization program which includes automated exposure control, adjustment of the mA and/or kV according to patient size and/or use of iterative reconstruction technique. CONTRAST:  75mL OMNIPAQUE  IOHEXOL  350 MG/ML SOLN COMPARISON:  12/02/2023 FINDINGS: Cardiovascular: No filling defects in the pulmonary arteries to suggest pulmonary emboli. Heart is normal size. Aorta is normal caliber. Scattered coronary artery and aortic calcifications. Mediastinum/Nodes: Mildly enlarged right paratracheal lymph node measuring 11 mm, likely reactive. No axillary or hilar adenopathy. Trachea and esophagus are unremarkable. Thyroid unremarkable. Lungs/Pleura: Emphysema. Dense consolidation throughout the right lower lobe and in the inferior right upper lobe, progressed since prior study concerning for pneumonia. Numerous small nodules throughout the left lung, new since prior study. These are all 5 mm or less in size. No effusions. Upper Abdomen: No acute findings Musculoskeletal: Chest wall soft tissues are unremarkable. No acute bony abnormality. Review of the MIP images confirms the above findings. IMPRESSION: No evidence of pulmonary embolus. Dense consolidation in the right lower lobe and inferior right upper lobe concerning for pneumonia. Numerous scattered small nodules throughout the left lung, 5 mm or less in size, new since prior study. Recommend follow-up CT in 3 3-6 months to assess stability. Coronary artery disease. Aortic Atherosclerosis (ICD10-I70.0) and Emphysema (ICD10-J43.9). Electronically Signed   By: Janeece Mechanic M.D.   On: 04/01/2024 23:06   DG Chest Port 1 View Result Date: 04/01/2024 CLINICAL DATA:  Questionable sepsis - evaluate for abnormality. History of pneumonia. Shortness of breath. EXAM: PORTABLE CHEST 1 VIEW COMPARISON:  12/02/2023 FINDINGS: Diffuse bilateral airspace disease, worsening since prior study. These are most  pronounced in the lower lobes. Heart is borderline in size. No definite effusions. No pneumothorax. No acute bony abnormality. IMPRESSION: Worsening bilateral airspace disease, most pronounced in the lower lobes. Findings could reflect edema or infection. Electronically Signed   By: Janeece Mechanic M.D.   On: 04/01/2024 22:15        Scheduled Meds:  amLODipine   10 mg Oral QPM   aspirin   81 mg Oral Daily   atorvastatin   40 mg Oral Daily   azithromycin   500 mg Oral Daily   cholecalciferol  2,000 Units Oral Daily   dextromethorphan -guaiFENesin   2 tablet Oral BID   empagliflozin   10 mg Oral Daily   enoxaparin (LOVENOX) injection  40 mg Subcutaneous Q24H   ezetimibe   10 mg Oral Daily   famotidine   20 mg Oral  QHS   ferrous sulfate  325 mg Oral BID WC   fluticasone   2 spray Each Nare Daily   fluticasone  furoate-vilanterol  1 puff Inhalation Daily   gabapentin  300 mg Oral BID   insulin aspart  0-15 Units Subcutaneous TID WC   insulin aspart  0-5 Units Subcutaneous QHS   insulin glargine -yfgn  20 Units Subcutaneous QHS   ipratropium-albuterol   3 mL Nebulization Q6H   losartan   25 mg Oral Daily   methylPREDNISolone (SOLU-MEDROL) injection  40 mg Intravenous Q12H   metoprolol  succinate  100 mg Oral Daily   pantoprazole  40 mg Oral Daily   pyridOXINE  25 mg Oral Daily   ranolazine   500 mg Oral BID   umeclidinium bromide   1 puff Inhalation Daily   Continuous Infusions:  cefTRIAXone (ROCEPHIN)  IV            Audria Leather, MD [facility] 04/02/2024, 8:42 AM

## 2024-04-02 NOTE — Plan of Care (Signed)
  Problem: Nutrition: Goal: Adequate nutrition will be maintained Outcome: Progressing   Problem: Activity: Goal: Risk for activity intolerance will decrease Outcome: Progressing   Problem: Pain Managment: Goal: General experience of comfort will improve and/or be controlled Outcome: Progressing   Problem: Safety: Goal: Ability to remain free from injury will improve Outcome: Progressing   Problem: Respiratory: Goal: Ability to maintain adequate ventilation will improve Outcome: Progressing Goal: Ability to maintain a clear airway will improve Outcome: Progressing

## 2024-04-02 NOTE — H&P (Signed)
 History and Physical  Jose Patrick ZOX:[id number] DOB: January 13, 1951 DOA: 04/01/2024  PCP: [name]   Chief Complaint: Shortness of breath, cough  HPI: Jose Patrick is a 73 y.o. male with medical history significant for COPD, chronic hypoxic respiratory failure on 2-3 L [place], lung cancer s/p LUL lobectomy in 2013 and presumed RLL lung cancer s/p recent radiation, OSA on BiPAP, T2DM, CAD and HTN who presented to the ED for evaluation of worsening cough and shortness of breath.  Patient reports he was hospitalized with pneumonia about a month ago with slight improvement in his symptoms however he continued to have persistent shortness of breath and congested cough.  Due to his persistent symptoms, he was started on Levaquin and steroids by his pulmonologist.  He has had worsening dyspnea on exertion, persistent cough with occasional orange sputum and fatigue over the last few days so he called his pulmonologist and was advised to present to the ED for further evaluation.  He also reports rib pain with his persistent cough but denies any fevers, chills, chest pain, dysuria, headache or dizziness.  ED Course: Initial vitals shows patient afebrile, normotensive with SpO2 of 93 to 94% on 2 L.  Labs overall unremarkable with normal CMP, CBC, lactic acid, INR and negative COVID, flu and RSV test. CXR shows worsening bilateral airspace opacities most pronounced in the lower lobes.  CTA PE study negative for PE but shows right lower lobe and inferior right upper lobe pneumonia. Patient received IV Rocephin, IV azithromycin  multiple DuoNebs and started on IV LR infusion. TRH was consulted for admission.  Review of Systems: Please see HPI for pertinent positives and negatives. A complete 10 system review of systems are otherwise negative.  Past Medical History:  Diagnosis Date   ALLERGIC RHINITIS 10/08/2007   Qualifier: Diagnosis of  By: Autry Legions MD, Alveda Aures    BENIGN PROSTATIC HYPERTROPHY  09/28/2010   Qualifier: Diagnosis of  By: Autry Legions MD, Alveda Aures    Bipolar affective disorder (HCC) 09/30/2012   COLONIC POLYPS, HX OF 10/08/2007   Qualifier: Diagnosis of  By: Autry Legions MD, Alveda Aures    Complication of anesthesia    sleeps awhile afterwards per patient   CONSTIPATION, CHRONIC, HX OF 08/10/2007   Qualifier: Diagnosis of  By: Jarold Merlin    DIABETES MELLITUS, TYPE II 10/08/2007   Qualifier: Diagnosis of  By: Autry Legions MD, Alveda Aures    ERECTILE DYSFUNCTION 10/08/2007   Qualifier: Diagnosis of  By: Autry Legions MD, Alveda Aures    HYPERLIPIDEMIA 10/08/2007   Qualifier: Diagnosis of  By: Autry Legions MD, Alveda Aures    HYPERTENSION 03/20/2009   Qualifier: Diagnosis of  By: Autry Legions MD, Alveda Aures    PTSD (post-traumatic stress disorder) 09/30/2012   SPINAL STENOSIS, LUMBAR 10/08/2007   Per Dr Larrie Po - [facility]   Qualifier: Diagnosis of  By: Autry Legions MD, Alveda Aures     Past Surgical History:  Procedure Laterality Date   COLONOSCOPY WITH PROPOFOL  N/A 04/09/2021   Procedure: COLONOSCOPY WITH PROPOFOL ;  Surgeon: Irby Mannan, MD;  Location: [facility] ENDOSCOPY;  Service: Endoscopy;  Laterality: N/A;   cyst off right buttock     ESOPHAGOGASTRODUODENOSCOPY (EGD) WITH PROPOFOL  N/A 04/09/2021   Procedure: ESOPHAGOGASTRODUODENOSCOPY (EGD) WITH PROPOFOL ;  Surgeon: Irby Mannan, MD;  Location: [facility] ENDOSCOPY;  Service: Endoscopy;  Laterality: N/A;   PREPATELLAR BURSA EXCISION     removal right   s/p bilat hydrocelectomy  2008   x 2  TONSILLECTOMY     Social History:  reports that he quit smoking about 12 years ago. His smoking use included cigarettes. He started smoking about 53 years ago. He has a 61.5 pack-year smoking history. He has never used smokeless tobacco. He reports current alcohol use. He reports that he does not use drugs.  Allergies  Allergen Reactions   Chlorzoxazone Swelling    Psychosis   Tramadol Other (See Comments)    Muscles Tightness  Muscles Tightness      Family  History  Problem Relation Age of Onset   Cancer Mother    Ulcers Mother        Stomach   Depression Mother    Anxiety disorder Mother    Allergies Sister      Prior to Admission medications   Medication Sig Start Date End Date Taking? Authorizing Provider  albuterol  (VENTOLIN  HFA) 108 (90 Base) MCG/ACT inhaler Inhale 2 puffs into the lungs every 6 (six) hours as needed for wheezing or shortness of breath. 03/19/22  Yes Quillian Brunt, MD  amLODipine  (NORVASC ) 10 MG tablet Take 10 mg by mouth every evening. 11/23/21  Yes [provider]  aspirin  81 MG chewable tablet Chew 1 tablet (81 mg total) by mouth daily. 12/19/11  Yes Roslyn Coombe, MD  atorvastatin  (LIPITOR) 40 MG tablet Take 1 tablet (40 mg total) by mouth daily. 08/04/23  Yes Thukkani, Arun K, MD  chlorpheniramine-HYDROcodone (TUSSIONEX) 10-8 MG/5ML Take 5 mLs by mouth every 12 (twelve) hours as needed for cough. 03/23/24  Yes [provider]  Cholecalciferol (VITAMIN D ) 2000 UNITS CAPS Take 1 capsule by mouth daily.   Yes [provider]  cycloSPORINE (RESTASIS) 0.05 % ophthalmic emulsion Place 1 drop into both eyes 2 (two) times daily. 01/12/24  Yes [provider]  dextromethorphan -guaiFENesin  (TUSSIN DM) 10-100 MG/5ML liquid Take 5 mLs by mouth every 4 (four) hours as needed for cough. 03/19/24  Yes Antonio Baumgarten, NP  ezetimibe  (ZETIA ) 10 MG tablet Take 10 mg by mouth daily. 12/09/23  Yes [provider]  famotidine  (PEPCID ) 20 MG tablet Take 1 tablet (20 mg total) by mouth at bedtime. 02/11/22 04/02/24 Yes Marnee Sink, MD  ferrous sulfate 325 (65 FE) MG EC tablet Take 325 mg by mouth in the morning and at bedtime.   Yes [provider]  fluticasone  (FLONASE) 50 MCG/ACT nasal spray Place 2 sprays into both nostrils daily. 08/11/23  Yes [provider]  fluticasone -salmeterol (WIXELA INHUB) 250-50 MCG/ACT AEPB ONE puff in the in morning and ONE puff in the evening 01/14/24   Yes Quillian Brunt, MD  furosemide (LASIX) 20 MG tablet Take 20 mg by mouth as needed for edema.   Yes [provider]  gabapentin (NEURONTIN) 300 MG capsule Take 300 mg by mouth 2 (two) times daily.   Yes [provider]  guaiFENesin  (MUCINEX ) 600 MG 12 hr tablet Take 1 tablet (600 mg total) by mouth 2 (two) times daily as needed for to loosen phlegm or cough. 03/20/22  Yes Quillian Brunt, MD  insulin glargine  (LANTUS ) 100 UNIT/ML injection 20 Units.   Yes [provider]  ipratropium-albuterol  (DUONEB) 0.5-2.5 (3) MG/3ML SOLN Take 3 mLs by nebulization every 6 (six) hours as needed. 02/09/24  Yes Parrett, Tammy S, NP  JARDIANCE  10 MG TABS tablet Take 10 mg by mouth daily. 02/23/21  Yes [provider]  losartan  (COZAAR ) 25 MG tablet Take 25 mg by mouth daily. 1/2 tab  daily 01/01/23  Yes [provider]  metFORMIN  (GLUCOPHAGE ) 1000 MG tablet Take 1,000 mg by mouth 2 (two) times daily. 09/14/21  Yes [provider]  metoprolol  succinate (TOPROL -XL) 100 MG 24 hr tablet Take 100 mg by mouth daily. Take with or immediately following a meal.   Yes [provider]  omeprazole  (PRILOSEC) 40 MG capsule Take 40 mg by mouth daily. 12/09/23  Yes [provider]  predniSONE  (DELTASONE ) 10 MG tablet Take 4 tablets (40 mg total) by mouth daily with breakfast for 5 days, THEN 3 tablets (30 mg total) daily with breakfast for 5 days, THEN 2 tablets (20 mg total) daily with breakfast for 5 days, THEN 1 tablet (10 mg total) daily with breakfast for 5 days. 4 tabs for  days, then 3 tabs for 2 days, 2 tabs for 2 days, then 1 tab for 2 days, then stop. 03/24/24 04/13/24 Yes Antonio Baumgarten, NP  psyllium (METAMUCIL SMOOTH TEXTURE) 58.6 % powder Take 1 packet by mouth daily. 12/20/21  Yes Marnee Sink, MD  Pyridoxine HCl (VITAMIN B6 PO) Take 1 tablet by mouth daily.    Yes [provider]  ranolazine  (RANEXA ) 500 MG 12 hr tablet Take 1 tablet (500  mg total) by mouth 2 (two) times daily. 02/11/23  Yes Sonny Dust, MD  Semaglutide , 2 MG/DOSE, 8 MG/3ML SOPN Inject 2 mg into the skin once a week. 02/14/24  Yes [provider]  Testosterone 20.25 MG/ACT (1.62%) GEL testosterone 20.25 mg/1.25 gram per pump act.(1.62 %) transdermal gel 12/24/21  Yes [provider]  Tiotropium Bromide Monohydrate  (SPIRIVA  RESPIMAT) 2.5 MCG/ACT AERS Inhale 2 puffs into the lungs daily. 01/14/24  Yes Quillian Brunt, MD  amoxicillin -clavulanate (AUGMENTIN ) 875-125 MG tablet Take 1 tablet by mouth 2 (two) times daily. Patient not taking: Reported on 04/01/2024 03/10/24   [provider]  azithromycin  (ZITHROMAX ) 250 MG tablet Take 250 mg by mouth as directed. Patient not taking: Reported on 04/01/2024 12/30/23   [provider]  doxycycline (VIBRA-TABS) 100 MG tablet Take 100 mg by mouth 2 (two) times daily. Patient not taking: Reported on 04/01/2024 03/10/24   [provider]  levofloxacin (LEVAQUIN) 750 MG tablet Take 750 mg by mouth daily. Patient not taking: Reported on 04/01/2024 03/23/24   [provider]  PEG-3350/ELECTROLYTES/ASCORBAT 100 g SOLR Take 1 kit by mouth once. Patient not taking: Reported on 04/01/2024 12/10/23   [provider]  Semaglutide ,0.25 or 0.5MG /DOS, (OZEMPIC , 0.25 OR 0.5 MG/DOSE,) 2 MG/1.5ML SOPN Inject 0.5 mg into the skin once a week. Patient not taking: Reported on 04/02/2024    [provider]  sucralfate  (CARAFATE ) 1 g tablet Take 1 tablet (1 g total) by mouth with breakfast, with lunch, and with evening meal. 12/02/23 01/01/24  Merdis Stalling, MD    Physical Exam: BP 131/61 (BP Location: Left Arm)   Pulse 71   Temp 97.6 F (36.4 C)   Resp 20   Ht 5\' 8"  (1.727 m)   Wt 107.7 kg   SpO2 96%   BMI 36.10 kg/m  General: Pleasant, acutely ill-appearing elderly man laying in bed. No acute distress. HEENT: [place]/AT. Anicteric sclera CV: RRR. No murmurs, rubs, or  gallops. No LE edema Pulmonary: On 2 L [place]. Lungs CTAB. Rhonchi at the bases. Decreased air movement. Mild expiratory wheezes bilaterally. Abdominal: Soft, nontender, nondistended. Normal bowel sounds. Extremities: Palpable radial and DP pulses. Normal ROM. Skin: Warm and dry. No obvious rash or lesions.  Neuro: A&Ox3. Moves all extremities. Normal sensation to light touch. No focal deficit. Psych: Normal mood and affect          Labs on Admission:  Basic Metabolic Panel: Recent Labs  Lab 04/01/24 2125  NA 137  K 3.8  CL 104  CO2 25  GLUCOSE 176*  BUN 20  CREATININE 1.07  CALCIUM  9.1   Liver Function Tests: Recent Labs  Lab 04/01/24 2125  AST 18  ALT 28  ALKPHOS 58  BILITOT 0.8  PROT 5.8*  ALBUMIN 2.9*   No results for input(s): "LIPASE", "AMYLASE" in the last 168 hours. No results for input(s): "AMMONIA" in the last 168 hours. CBC: Recent Labs  Lab 04/01/24 2125  WBC 9.1  NEUTROABS 5.9  HGB 15.8  HCT 49.3  MCV 81.8  PLT 270   Cardiac Enzymes: No results for input(s): "CKTOTAL", "CKMB", "CKMBINDEX", "TROPONINI" in the last 168 hours. BNP (last 3 results) Recent Labs    12/02/23 1610  BNP 38.4    ProBNP (last 3 results) No results for input(s): "PROBNP" in the last 8760 hours.  CBG: No results for input(s): "GLUCAP" in the last 168 hours.  Radiological Exams on Admission: CT Angio Chest PE W and/or Wo Contrast Result Date: 04/01/2024 CLINICAL DATA:  Pulmonary embolism (PE) suspected, high prob. Pneumonia, shortness of breath. EXAM: CT ANGIOGRAPHY CHEST WITH CONTRAST TECHNIQUE: Multidetector CT imaging of the chest was performed using the standard protocol during bolus administration of intravenous contrast. Multiplanar CT image reconstructions and MIPs were obtained to evaluate the vascular anatomy. RADIATION DOSE REDUCTION: This exam was performed according to the departmental dose-optimization program which includes automated exposure control,  adjustment of the mA and/or kV according to patient size and/or use of iterative reconstruction technique. CONTRAST:  75mL OMNIPAQUE  IOHEXOL  350 MG/ML SOLN COMPARISON:  12/02/2023 FINDINGS: Cardiovascular: No filling defects in the pulmonary arteries to suggest pulmonary emboli. Heart is normal size. Aorta is normal caliber. Scattered coronary artery and aortic calcifications. Mediastinum/Nodes: Mildly enlarged right paratracheal lymph node measuring 11 mm, likely reactive. No axillary or hilar adenopathy. Trachea and esophagus are unremarkable. Thyroid unremarkable. Lungs/Pleura: Emphysema. Dense consolidation throughout the right lower lobe and in the inferior right upper lobe, progressed since prior study concerning for pneumonia. Numerous small nodules throughout the left lung, new since prior study. These are all 5 mm or less in size. No effusions. Upper Abdomen: No acute findings Musculoskeletal: Chest wall soft tissues are unremarkable. No acute bony abnormality. Review of the MIP images confirms the above findings. IMPRESSION: No evidence of pulmonary embolus. Dense consolidation in the right lower lobe and inferior right upper lobe concerning for pneumonia. Numerous scattered small nodules throughout the left lung, 5 mm or less in size, new since prior study. Recommend follow-up CT in 3 3-6 months to assess stability. Coronary artery disease. Aortic Atherosclerosis (ICD10-I70.0) and Emphysema (ICD10-J43.9). Electronically Signed   By: Janeece Mechanic M.D.   On: 04/01/2024 23:06   DG Chest Port 1 View Result Date: 04/01/2024 CLINICAL DATA:  Questionable sepsis - evaluate for abnormality. History of pneumonia. Shortness of breath. EXAM: PORTABLE CHEST 1 VIEW COMPARISON:  12/02/2023 FINDINGS: Diffuse bilateral airspace disease, worsening since prior study. These are most pronounced in the lower lobes. Heart is borderline in size. No definite effusions. No pneumothorax. No acute bony abnormality. IMPRESSION:  Worsening bilateral airspace disease, most pronounced in the lower lobes. Findings could reflect edema or infection. Electronically Signed   By: Janeece Mechanic M.D.  On: 04/01/2024 22:15   Assessment/Plan Jose Patrick is a 73 y.o. male with medical history significant for COPD, chronic hypoxic respiratory failure on 2-3 L [place], lung cancer s/p LUL lobectomy in 2013 and presumed RLL lung cancer s/p recent radiation, OSA on BiPAP, T2DM, CAD and HTN who presented to the ED for evaluation of worsening cough and shortness of breath and admitted for community-acquired pneumonia.  # Community-acquired pneumonia - Multiple diagnosis of pneumonia over the last few months - Failed outpatient therapy - Complicated by his pulmonary fibrosis - Continue IV Rocephin and azithromycin  - Scheduled Mucinex  and as needed Tessalon  [place] - Check sputum culture, MRSA screen, urinary Legionella and strep pneumo - Follow-up morning procalcitonin - Trend CBC, fever curve - Follows closely with [facility], with his recurrent respiratory infections, will benefit from pulm consult during this hospitalization  # COPD with acute exacerbation # Chronic hypoxic respiratory failure # Pulmonary fibrosis - Recent increase shortness of breath, cough occasionally productive and evidence of wheezing on exam - Element pulmonary fibrosis from radiation therapy complicated by difficulty bringing out phlegm - Remains on his baseline 2 to 3 L [place] - IV Solu-Medrol 125 mg x 1 followed by prednisone  40 mg daily - IV antibiotics as above - Continue home bronchodilators - Scheduled DuoNebs - Incentive spirometer, flutter valve  # Lung cancer - Hx of lung cancer s/p LUL lobectomy in 2013 - Presumed RLL lung cancer s/p completion of radiation therapy in January 2025 - Follows with [facility] for serial imaging  # HTN - BP stable with SBP in the 110s to 120s - Continue losartan  and Toprol  XL  # T2DM -  Blood sugar 176 on CMP - SSI with meals, CBG monitoring - Continue Jardiance  and hold metformin   # CAD # HLD - Continue aspirin , Ranexa , Zetia  and atorvastatin   # GERD - Continue PPI and famotidine   # Neuropathy - Likely secondary to radiation therapy - Continue pyridoxine and  gabapentin  # OSA - States he is unable to tolerate his BiPAP due to feeling like he is suffocating  # Class II obesity Body mass index is 36.1 kg/m. Filed Weights   04/02/24 0200  Weight: 107.7 kg  - Follow-up with PCP for weight loss and nutrition counseling   DVT prophylaxis: Lovenox     Code Status: Full Code  Consults called: None  Family Communication: Discussed admission with spouse at bedside  Severity of Illness: The appropriate patient status for this patient is INPATIENT. Inpatient status is judged to be reasonable and necessary in order to provide the required intensity of service to ensure the patient's safety. The patient's presenting symptoms, physical exam findings, and initial radiographic and laboratory data in the context of their chronic comorbidities is felt to place them at high risk for further clinical deterioration. Furthermore, it is not anticipated that the patient will be medically stable for discharge from the hospital within 2 midnights of admission.   * I certify that at the point of admission it is my clinical judgment that the patient will require inpatient hospital care spanning beyond 2 midnights from the point of admission due to high intensity of service, high risk for further deterioration and high frequency of surveillance required.*  Level of care: Med-Surg   This record has been created using Conservation officer, historic buildings. Errors have been sought and corrected, but may not always be located. Such creation errors do not reflect on the standard of care.   Elayah Klooster,  Annette Killings, MD 04/02/2024, 2:46 AM [facility] Pager: [phone] Isaiah 41:10    If 7PM-7AM, please contact night-coverage [email or web address] Password [email or web address]

## 2024-04-02 NOTE — TOC Initial Note (Addendum)
 Transition of Care [facility]) - Initial/Assessment Note    Patient Details  Name: Jose Patrick MRN: [id number] Date of Birth: [birth year]  [facility] CM/SW Contact:    Bari Leys, RN Phone Number: 04/02/2024, 1:13 PM  Clinical Narrative:  NCM called to patient's room to introduce role of TOC/NCM and review for dc planning, Pt reports he receives his primary care at the [facility] in [place], reports he resides with his spouse, reports he has a HH RN through the [place] he thinks , who comes out to check his legs and his pills. Patient reports home DME; wheelchair, scooter, RW. Patient reports he is on home 02 and that he has home 02 concentrators but no portable 02. NCM will contact [facility] to verify [facility] for portable 02.   -1:30pm NCM called to [place], sw Ke[facility] in [facility], confirmed [facility] does not [facility] battery operated portable 02, reports patient has 12 portable 02 tanks , 02 provided through [facility]. NCM will follow up with patient to have his spouse/family member bring portabl 02 tank to hospital. TOC will continue to follow.       -1:36pm Call to patient's spouse, introduced self and role of TOC/NCM, discussed patient's request for portable 02, [facility] does not pay for battery operated. Spouse reports patient does have portable 02 tanks and that he has one in his hospital room for travel home at dc. TOC will continue to follow.            Expected Discharge Plan: Home/Self Care Barriers to Discharge: Continued Medical Work up   Patient Goals and CMS Choice Patient states their goals for this hospitalization and ongoing recovery are:: return home          Expected Discharge Plan and Services       Living arrangements for the past 2 months: Single Family Home                                      Prior Living Arrangements/Services Living arrangements for the past 2 months: Single Family Home Lives with:: Spouse Patient language and need  for interpreter reviewed:: Yes Do you feel safe going back to the place where you live?: Yes      Need for Family Participation in Patient Care: Yes (Comment) Care giver support system in place?: Yes (comment) Current home services: DME (RW, Scooter, W/C, potty chair, shower chair, home 02) Criminal Activity/Legal Involvement Pertinent to Current Situation/Hospitalization: No - Comment as needed  Activities of Daily Living   ADL Screening (condition at time of admission) Independently performs ADLs?: Yes (appropriate for developmental age) Is the patient deaf or have difficulty hearing?: Yes Does the patient have difficulty seeing, even when wearing glasses/contacts?: No Does the patient have difficulty concentrating, remembering, or making decisions?: Yes  Permission Sought/Granted                  Emotional Assessment   Attitude/Demeanor/Rapport: Engaged Affect (typically observed): Accepting Orientation: : Oriented to Self, Oriented to Place, Oriented to  Time, Oriented to Situation Alcohol / Substance Use: Not Applicable Psych Involvement: No (comment)  Admission diagnosis:  Community acquired pneumonia [J18.9] Pneumonia due to infectious organism, unspecified laterality, unspecified part of lung [J18.9] Patient Active Problem List   Diagnosis Date Noted   Community acquired pneumonia 04/01/2024   Right lower lobe pulmonary nodule 01/26/2023  COPD with acute exacerbation (HCC) 01/26/2023   Hx of colonic polyps    Other dysphagia    Gastric erythema    Hypogonadism in male 04/02/2021   Pain involving joints of fingers of both hands 04/02/2021   Carpal tunnel syndrome, bilateral upper limbs 04/02/2021   Dry eye syndrome of bilateral lacrimal glands 04/02/2021   Dyspnea, unspecified 04/02/2021   Encounter for follow-up examination after completed treatment for malignant neoplasm 04/02/2021   Fractured dental restorative material without loss of material 04/02/2021    Gastroesophageal reflux disease 04/02/2021   Hormone replacement therapy 04/02/2021   Male erectile disorder (CODE) 04/02/2021   Nail dystrophy 04/02/2021   Observation and evaluation for other specified suspected conditions 04/02/2021   Obstructive sleep apnea (adult) (pediatric) 04/02/2021   Pain in joint involving ankle and foot 04/02/2021   Shoulder pain 04/02/2021   Pain in right hand 04/02/2021   Pain in right knee 04/02/2021   Rash and other nonspecific skin eruption 04/02/2021   Stiffness of right hand, not elsewhere classified 04/02/2021   Stress due to family tension 04/02/2021   Type 2 diabetes mellitus with diabetic neuropathy, unspecified (HCC) 04/02/2021   Vitamin B12 deficiency 04/02/2021   Vitamin D  deficiency 04/02/2021   Cervical radiculopathy due to degenerative joint disease of spine 08/23/2019   Abdominal bloating 05/10/2015   Colon polyps 05/10/2015   Rectal pain 05/10/2015   COPD with chronic bronchitis and emphysema (HCC) 01/22/2014   Schizoaffective disorder (HCC) 10/25/2013   S/P lobectomy of lung 01/15/2013   Bipolar affective disorder (HCC) 09/30/2012   PTSD (post-traumatic stress disorder) 09/30/2012   Non-small cell lung cancer (HCC) 01/09/2012   Preventative health care 03/31/2011   BENIGN PROSTATIC HYPERTROPHY 09/28/2010   Cough 05/10/2010   DEPRESSION, MAJOR 09/26/2009   Incontinence of feces 05/18/2009   Essential hypertension 03/20/2009   VERTIGO, INTERMITTENT 03/28/2008   HYPERLIPIDEMIA 10/08/2007   ERECTILE DYSFUNCTION 10/08/2007   Allergic rhinitis 10/08/2007   SPINAL STENOSIS, LUMBAR 10/08/2007   History of colonic polyps 10/08/2007   Anxiety state 08/10/2007   CONSTIPATION, CHRONIC, HX OF 08/10/2007   PCP:  Clinic, [name]:   [facility] - [place], [place] - [address] [place] [address] Phone: [phone] Fax: [phone]  [facility] - [place], [place] - [address] [place] [address] Phone: [phone] Fax: [phone]     Social Drivers of Health (SDOH) Social History: SDOH Screenings   Food Insecurity: No Food Insecurity (04/02/2024)  Housing: Low Risk  (04/02/2024)  Transportation Needs: No Transportation Needs (04/02/2024)  Utilities: Not At Risk (04/02/2024)  Depression (PHQ2-9): Low Risk  (11/20/2021)  Financial Resource Strain: Low Risk  (12/02/2023)   Received from [facility]  Physical Activity: Unknown (03/17/2024)   Received from [facility]  Social Connections: Moderately Integrated (04/02/2024)  Stress: Stress Concern Present (03/08/2024)   Received from [facility]  Tobacco Use: Medium Risk (04/02/2024)   SDOH Interventions:     Readmission Risk Interventions    04/02/2024    1:08 PM  Readmission Risk Prevention Plan  Transportation Screening Complete  PCP or Specialist Appt within 5-7 Days Complete  Home Care Screening Complete  Medication Review (RN CM) Complete

## 2024-04-02 NOTE — Telephone Encounter (Signed)
 Patient was seen and is currently admitted in the hospital,nfn

## 2024-04-02 NOTE — Consult Note (Signed)
 NAME:  Jose Patrick, MRN:  [id number], DOB:  04-15-1951, LOS: 1 ADMISSION DATE:  04/01/2024, CONSULTATION DATE:  04/02/24 REFERRING MD:  TRH, CHIEF COMPLAINT:  cough   History of Present Illness:  73 year old man history of lung cancer chronic hypoxemia whom we are seeing for pneumonia.  He went to the ED at outside hospital 03/23/2024.  CT scan showed pneumonia right middle lobe right lower lobe.  There were infiltrates on 03/08/2024 reportedly as well.  From outside films.  He was placed on Augmentin .  Did not improve much seen in pulmonary clinic last week.  Escalated to levofloxacin.  No real improvement.  Chest x-ray obtained by oncologist 5/15 was reportedly worse.  He was directed to the ED.  Or call pulmonary.  He called us  and got an appointment.  On exam he was rhonchorous.  He endorsed malaise, increased fatigue and sleepiness.  Concern for developing sepsis.  His cough and shortness of breath was getting worse.  He was worse despite 2 courses of oral antibiotics was recommended to go to the ED for IV antibiotics and admission.  CT scan in the ED showed right lower lobe and right upper lobe infiltrates.  He is placed on azithromycin  and ceftriaxone.  Strep and Legionella urinary antigens ordered but not obtained.  Lower respiratory culture ordered but not obtained.  Respiratory viral panel negative.  Pertinent  Medical History  Lung cancer, emphysema, chronic hypoxemia  Significant Hospital Events: Including procedures, antibiotic start and stop dates in addition to other pertinent events   5/15 sent to ED for pulmonary office admitted with pneumonia on the right on CT scan 5/16 PCCM consultation requested  Interim History / Subjective:    Objective    Blood pressure 135/67, pulse 73, temperature (!) 97.5 F (36.4 C), temperature source Oral, resp. rate 18, height 5\' 8"  (1.727 m), weight 107.7 kg, SpO2 96%.        Intake/Output Summary (Last 24 hours) at 04/02/2024 1413 Last  data filed at 04/02/2024 0900 Gross per 24 hour  Intake 2040 ml  Output 1400 ml  Net 640 ml   Filed Weights   04/02/24 0200  Weight: 107.7 kg    Examination: General: Lying in bed, no acute distress HENT: Atraumatic normocephalic Lungs: Coarse, junky throughout Cardiovascular: Borderline tachycardic, no murmur Abdomen: Nondistended Neuro: No focal deficits moves all extremities   Resolved problem list   Assessment and Plan   Persistent pneumonia: Noted on CT scan 03/23/2024.  Has been treated with 7 days of Augmentin  and subsequent 7 more days of levofloxacin.  With worsening symptoms of cough and shortness of breath.  CT scan demonstrates right upper lobe and right lower lobe infiltrates.  It is possible that given worsening airspace disease despite repeated course of antibiotics this could represent rapidly progressive tumor burden. -- Stop ceftriaxone and azithromycin , has failed similar therapies as outpatient --MRSA screen negative, escalate to Zosyn --Neb ICS/LAMA/LABA --Continue steroids --Legionella, strep urinary Ags ordered need to collect --LRCx ordered need to collect  Chronic hypoxemic respiratory failure: Stable on 2 L nasal cannula.  Best Practice (right click and "Reselect all SmartList Selections" daily)   Per Primary  Labs   CBC: Recent Labs  Lab 04/01/24 2125 04/02/24 0337  WBC 9.1 8.1  NEUTROABS 5.9  --   HGB 15.8 15.4  HCT 49.3 49.5  MCV 81.8 82.9  PLT 270 247    Basic Metabolic Panel: Recent Labs  Lab 04/01/24 2125 04/02/24 0337  NA  137 136  K 3.8 4.3  CL 104 103  CO2 25 26  GLUCOSE 176* 204*  BUN 20 20  CREATININE 1.07 1.09  CALCIUM  9.1 8.9   GFR: Estimated Creatinine Clearance: 72.9 mL/min (by C-G formula based on SCr of 1.09 mg/dL). Recent Labs  Lab 04/01/24 2125 04/01/24 2133 04/02/24 0329 04/02/24 0337  PROCALCITON  --   --  <0.10  --   WBC 9.1  --   --  8.1  LATICACIDVEN  --  1.3  --   --     Liver Function  Tests: Recent Labs  Lab 04/01/24 2125  AST 18  ALT 28  ALKPHOS 58  BILITOT 0.8  PROT 5.8*  ALBUMIN 2.9*   No results for input(s): "LIPASE", "AMYLASE" in the last 168 hours. No results for input(s): "AMMONIA" in the last 168 hours.  ABG    Component Value Date/Time   TCO2 25 12/02/2023 1616     Coagulation Profile: Recent Labs  Lab 04/01/24 2134  INR 1.0    Cardiac Enzymes: No results for input(s): "CKTOTAL", "CKMB", "CKMBINDEX", "TROPONINI" in the last 168 hours.  HbA1C: Hgb A1c MFr Bld  Date/Time Value Ref Range Status  09/30/2012 10:31 AM 7.6 (H) 4.6 - 6.5 % Final    Comment:    Glycemic Control Guidelines for People with Diabetes:Non Diabetic:  <6%Goal of Therapy: <7%Additional Action Suggested:  >8%   04/02/2011 11:03 AM 6.6 (H) 4.6 - 6.5 % Final    Comment:    Glycemic Control Guidelines for People with Diabetes:Non Diabetic:  <6%Goal of Therapy: <7%Additional Action Suggested:  >8%     CBG: Recent Labs  Lab 04/02/24 0843 04/02/24 1135  GLUCAP 203* 238*    Review of Systems:   Negative unless mentioned in HPI  Past Medical History:  He,  has a past medical history of ALLERGIC RHINITIS (10/08/2007), BENIGN PROSTATIC HYPERTROPHY (09/28/2010), Bipolar affective disorder (HCC) (09/30/2012), COLONIC POLYPS, HX OF (10/08/2007), Complication of anesthesia, CONSTIPATION, CHRONIC, HX OF (08/10/2007), DIABETES MELLITUS, TYPE II (10/08/2007), ERECTILE DYSFUNCTION (10/08/2007), HYPERLIPIDEMIA (10/08/2007), HYPERTENSION (03/20/2009), PTSD (post-traumatic stress disorder) (09/30/2012), and SPINAL STENOSIS, LUMBAR (10/08/2007).   Surgical History:   Past Surgical History:  Procedure Laterality Date   COLONOSCOPY WITH PROPOFOL  N/A 04/09/2021   Procedure: COLONOSCOPY WITH PROPOFOL ;  Surgeon: Irby Mannan, MD;  Location: [facility] ENDOSCOPY;  Service: Endoscopy;  Laterality: N/A;   cyst off right buttock     ESOPHAGOGASTRODUODENOSCOPY (EGD) WITH PROPOFOL  N/A 04/09/2021    Procedure: ESOPHAGOGASTRODUODENOSCOPY (EGD) WITH PROPOFOL ;  Surgeon: Irby Mannan, MD;  Location: [facility] ENDOSCOPY;  Service: Endoscopy;  Laterality: N/A;   PREPATELLAR BURSA EXCISION     removal right   s/p bilat hydrocelectomy  2008   x 2   TONSILLECTOMY       Social History:   reports that he quit smoking about 12 years ago. His smoking use included cigarettes. He started smoking about 53 years ago. He has a 61.5 pack-year smoking history. He has never used smokeless tobacco. He reports current alcohol use. He reports that he does not use drugs.   Family History:  His family history includes Allergies in his sister; Anxiety disorder in his mother; Cancer in his mother; Depression in his mother; Ulcers in his mother.   Allergies Allergies  Allergen Reactions   Chlorzoxazone Swelling    Psychosis   Tramadol Other (See Comments)    Muscles Tightness  Muscles Tightness       Home Medications  Prior  to Admission medications   Medication Sig Start Date End Date Taking? Authorizing Provider  albuterol  (VENTOLIN  HFA) 108 (90 Base) MCG/ACT inhaler Inhale 2 puffs into the lungs every 6 (six) hours as needed for wheezing or shortness of breath. 03/19/22  Yes Quillian Brunt, MD  amLODipine  (NORVASC ) 10 MG tablet Take 10 mg by mouth every evening. 11/23/21  Yes [provider]  aspirin  81 MG chewable tablet Chew 1 tablet (81 mg total) by mouth daily. 12/19/11  Yes Roslyn Coombe, MD  atorvastatin  (LIPITOR) 40 MG tablet Take 1 tablet (40 mg total) by mouth daily. 08/04/23  Yes Thukkani, Arun K, MD  chlorpheniramine-HYDROcodone (TUSSIONEX) 10-8 MG/5ML Take 5 mLs by mouth every 12 (twelve) hours as needed for cough. 03/23/24  Yes [provider]  Cholecalciferol (VITAMIN D ) 2000 UNITS CAPS Take 1 capsule by mouth daily.   Yes [provider]  cycloSPORINE (RESTASIS) 0.05 % ophthalmic emulsion Place 1 drop into both eyes 2 (two) times daily. 01/12/24  Yes [provider]  dextromethorphan -guaiFENesin  (TUSSIN DM) 10-100 MG/5ML liquid Take 5 mLs by mouth every 4 (four) hours as needed for cough. 03/19/24  Yes Antonio Baumgarten, NP  ezetimibe  (ZETIA ) 10 MG tablet Take 10 mg by mouth daily. 12/09/23  Yes [provider]  famotidine  (PEPCID ) 20 MG tablet Take 1 tablet (20 mg total) by mouth at bedtime. 02/11/22 04/02/24 Yes Marnee Sink, MD  ferrous sulfate 325 (65 FE) MG EC tablet Take 325 mg by mouth in the morning and at bedtime.   Yes [provider]  fluticasone  (FLONASE) 50 MCG/ACT nasal spray Place 2 sprays into both nostrils daily. 08/11/23  Yes [provider]  fluticasone -salmeterol (WIXELA INHUB) 250-50 MCG/ACT AEPB ONE puff in the in morning and ONE puff in the evening 01/14/24  Yes Quillian Brunt, MD  furosemide (LASIX) 20 MG tablet Take 20 mg by mouth as needed for edema.   Yes [provider]  gabapentin (NEURONTIN) 300 MG capsule Take 300 mg by mouth 2 (two) times daily.   Yes [provider]  guaiFENesin  (MUCINEX ) 600 MG 12 hr tablet Take 1 tablet (600 mg total) by mouth 2 (two) times daily as needed for to loosen phlegm or cough. 03/20/22  Yes Quillian Brunt, MD  insulin glargine  (LANTUS ) 100 UNIT/ML injection 20 Units.   Yes [provider]  ipratropium-albuterol  (DUONEB) 0.5-2.5 (3) MG/3ML SOLN Take 3 mLs by nebulization every 6 (six) hours as needed. 02/09/24  Yes Parrett, Tammy S, NP  JARDIANCE  10 MG TABS tablet Take 10 mg by mouth daily. 02/23/21  Yes [provider]  losartan  (COZAAR ) 25 MG tablet Take 25 mg by mouth daily. 1/2 tab daily 01/01/23  Yes [provider]  metFORMIN  (GLUCOPHAGE ) 1000 MG tablet Take 1,000 mg by mouth 2 (two) times daily. 09/14/21  Yes [provider]  metoprolol  succinate (TOPROL -XL) 100 MG 24 hr tablet Take 100 mg by mouth daily. Take with or immediately following a meal.   Yes [provider]  omeprazole  (PRILOSEC) 40 MG  capsule Take 40 mg by mouth daily. 12/09/23  Yes [provider]  predniSONE  (DELTASONE ) 10 MG tablet Take 4 tablets (40 mg total) by mouth daily with breakfast for 5 days, THEN 3 tablets (30 mg total) daily with breakfast for 5 days, THEN 2 tablets (20 mg total) daily with breakfast for 5 days, THEN 1 tablet (10 mg total) daily with breakfast for 5 days. 4 tabs for  days, then 3 tabs for 2 days, 2 tabs for 2 days, then 1 tab for 2 days, then stop. 03/24/24 04/13/24 Yes Antonio Baumgarten, NP  psyllium (METAMUCIL SMOOTH TEXTURE) 58.6 % powder Take 1 packet by mouth daily. 12/20/21  Yes Marnee Sink, MD  Pyridoxine HCl (VITAMIN B6 PO) Take 1 tablet by mouth daily.    Yes [provider]  ranolazine  (RANEXA ) 500 MG 12 hr tablet Take 1 tablet (500 mg total) by mouth 2 (two) times daily. 02/11/23  Yes Sonny Dust, MD  Semaglutide , 2 MG/DOSE, 8 MG/3ML SOPN Inject 2 mg into the skin once a week. 02/14/24  Yes [provider]  Testosterone 20.25 MG/ACT (1.62%) GEL testosterone 20.25 mg/1.25 gram per pump act.(1.62 %) transdermal gel 12/24/21  Yes [provider]  Tiotropium Bromide Monohydrate  (SPIRIVA  RESPIMAT) 2.5 MCG/ACT AERS Inhale 2 puffs into the lungs daily. 01/14/24  Yes Quillian Brunt, MD  PEG-3350/ELECTROLYTES/ASCORBAT 100 g SOLR Take 1 kit by mouth once. Patient not taking: Reported on 04/01/2024 12/10/23   [provider]  Semaglutide ,0.25 or 0.5MG /DOS, (OZEMPIC , 0.25 OR 0.5 MG/DOSE,) 2 MG/1.5ML SOPN Inject 0.5 mg into the skin once a week. Patient not taking: Reported on 04/02/2024    [provider]  sucralfate  (CARAFATE ) 1 g tablet Take 1 tablet (1 g total) by mouth with breakfast, with lunch, and with evening meal. 12/02/23 01/01/24  Merdis Stalling, MD     Critical care time: n/a    Guerry Leek, MD See Tilford Foley

## 2024-04-03 DIAGNOSIS — J189 Pneumonia, unspecified organism: Secondary | ICD-10-CM | POA: Diagnosis not present

## 2024-04-03 LAB — HEMOGLOBIN A1C
Hgb A1c MFr Bld: 7.8 % — ABNORMAL HIGH (ref 4.8–5.6)
Mean Plasma Glucose: 177.16 mg/dL

## 2024-04-03 LAB — GLUCOSE, CAPILLARY
Glucose-Capillary: 252 mg/dL — ABNORMAL HIGH (ref 70–99)
Glucose-Capillary: 215 mg/dL — ABNORMAL HIGH (ref 70–99)
Glucose-Capillary: 358 mg/dL — ABNORMAL HIGH (ref 70–99)
Glucose-Capillary: 328 mg/dL — ABNORMAL HIGH (ref 70–99)

## 2024-04-03 LAB — EXPECTORATED SPUTUM ASSESSMENT W GRAM STAIN, RFLX TO RESP C

## 2024-04-03 MED ORDER — PREDNISONE 20 MG PO TABS
40.0000 mg | ORAL_TABLET | Freq: Every day | ORAL | Status: DC
  Administered 2024-04-04 – 2024-04-07 (×4): 40 mg via ORAL
  Filled 2024-04-03 (×4): qty 2

## 2024-04-03 MED ORDER — PREDNISONE 20 MG PO TABS
20.0000 mg | ORAL_TABLET | Freq: Every day | ORAL | Status: DC
Start: 2024-04-09 — End: 2024-04-07

## 2024-04-03 NOTE — Progress Notes (Signed)
 @Patient  ID: Jose Patrick, male    DOB: Sep 30, 1951, 73 y.o.   MRN: [id number]  Chief Complaint  Patient presents with   Acute Visit    Referring provider: [facility], [name]  HPI:   73 y.o. male with history of COPD and lung cancer followed by oncology on treatment whom are seen for evaluation of worsening cough congestion and shortness of breath.  Multiple prior pulmonary notes reviewed.  ED note 03/23/2024 reviewed.  Patient presents for acute visit.  Seen in the ED 03/23/2024.  CTA PE protocol at that time showed no PE but showed right upper lobe and right lower lobe infiltrates per report.  He was placed on Augmentin .  Socially placed on levofloxacin.  Completed 2 weeks of antibiotics total.  No better.  He feels worse.  More congested.  More short of breath.  Fortunately his oxygen saturation is stable on his 2 L.  Baseline oxygen.  He reports increased sleepiness.  Wants to sleep a lot more.  This is unusual for him.  This is concerning to him.  He was seen by his oncologist today.  I cannot review notes.  He had a chest x-ray performed and told it was worse.  He was instructed to go to the ED or call pulmonary.  He was scheduled to see me.  We discussed going to the ED given failure of outpatient antibiotics and concern for early sepsis with increased somnolence, sleepiness.  Questionaires / Pulmonary Flowsheets:   ACT:      No data to display          MMRC:     No data to display          Epworth:      No data to display          Tests:   FENO:  No results found for: "NITRICOXIDE"  PFT:    Latest Ref Rng & Units 04/29/2022    3:46 PM  PFT Results  FVC-Pre L 2.39   FVC-Predicted Pre % 67   FVC-Post L 2.49   FVC-Predicted Post % 70   Pre FEV1/FVC % % 65   Post FEV1/FCV % % 64   FEV1-Pre L 1.57   FEV1-Predicted Pre % 58   FEV1-Post L 1.59   DLCO uncorrected ml/min/mmHg 16.26   DLCO UNC% % 66   DLCO corrected ml/min/mmHg 16.26   DLCO COR  %Predicted % 66   DLVA Predicted % 100   TLC L 4.66   TLC % Predicted % 70   RV % Predicted % 80   Personally reviewed interpreted as moderate fixed obstruction, lung volumes with mild restriction, DLCO moderately reduced  WALK:     02/09/2024    1:45 PM 11/20/2021   12:04 PM 09/10/2021   11:21 AM  SIX MIN WALK  Supplimental Oxygen during Test? (L/min) Yes    O2 Flow Rate 2 L/min    2 Minute Oxygen Saturation %  90 % 96 %  2 Minute HR  114 83  4 Minute Oxygen Saturation %  89 % 96 %  4 Minute HR  127 78  6 Minute Oxygen Saturation %  89 % 96 %  6 Minute HR  124 78  Tech Comments: Placed on 2L POC      Imaging: Personally reviewed and as per EMR and discussion in this note  Lab Results: Personally reviewed CBC    Component Value Date/Time   WBC 8.1 04/02/2024 0337  RBC 5.97 (H) 04/02/2024 0337   HGB 15.4 04/02/2024 0337   HCT 49.5 04/02/2024 0337   PLT 247 04/02/2024 0337   MCV 82.9 04/02/2024 0337   MCH 25.8 (L) 04/02/2024 0337   MCHC 31.1 04/02/2024 0337   RDW 15.3 04/02/2024 0337   LYMPHSABS 1.9 04/01/2024 2125   MONOABS 0.8 04/01/2024 2125   EOSABS 0.4 04/01/2024 2125   BASOSABS 0.0 04/01/2024 2125    BMET    Component Value Date/Time   NA 136 04/02/2024 0337   K 4.3 04/02/2024 0337   CL 103 04/02/2024 0337   CO2 26 04/02/2024 0337   GLUCOSE 204 (H) 04/02/2024 0337   BUN 20 04/02/2024 0337   CREATININE 1.09 04/02/2024 0337   CALCIUM  8.9 04/02/2024 0337   GFRNONAA >60 04/02/2024 0337   GFRAA  02/23/2009 1513    >60        The eGFR has been calculated using the MDRD equation. This calculation has not been validated in all clinical situations. eGFR's persistently <60 mL/min signify possible Chronic Kidney Disease.    BNP    Component Value Date/Time   BNP 38.4 12/02/2023 1610    ProBNP No results found for: "PROBNP"  Specialty Problems       Pulmonary Problems   Allergic rhinitis   Qualifier: Diagnosis of  By: Autry Legions MD, Alveda Aures  IMO SNOMED Dx Update Oct 2024      Cough   Qualifier: Diagnosis of  By: Autry Legions MD, Alveda Aures       Non-small cell lung cancer [facility])   Formatting of this note might be different from the original. Last Assessment & Plan:  Details unclear, will ask for records to be forwarded      COPD with chronic bronchitis and emphysema (HCC)   Dyspnea, unspecified   Obstructive sleep apnea (adult) (pediatric)   COPD with acute exacerbation (HCC)   Right lower lobe pulmonary nodule   Community acquired pneumonia    Allergies  Allergen Reactions   Chlorzoxazone Swelling    Psychosis   Tramadol Other (See Comments)    Muscles Tightness  Muscles Tightness      Immunization History  Administered Date(s) Administered   Dtap, Unspecified 08/17/1958, 09/15/1958, 11/13/1960   Fluad Quad(high Dose 65+) 07/31/2020, 09/21/2021   Influenza Split 09/19/2014   Influenza Whole 10/12/2007, 09/28/2010   Influenza, High Dose Seasonal PF 09/03/2017, 08/13/2019, 09/25/2023   Influenza, Seasonal, Injecte, Preservative Fre 08/12/2016   Influenza-Unspecified 08/06/2011, 10/18/2012, 08/26/2013, 09/13/2015   Moderna Covid-19 Fall Seasonal Vaccine 21yrs & older 09/25/2023   Moderna Covid-19 Vaccine Bivalent Booster 47yrs & up 09/21/2021   Moderna Sars-Covid-2 Vaccination 12/22/2019, 01/19/2020, 11/02/2020   Pneumococcal Conjugate-13 11/21/2016   Pneumococcal Polysaccharide-23 12/06/2008, 01/19/2018   Pneumococcal-Unspecified 11/29/2011   Polio, Unspecified 07/11/1958, 08/17/1958, 11/13/1960   Rsv, Bivalent, Protein Subunit Rsvpref,pf Pattricia Bores) 12/18/2023   Smallpox 07/16/1958   Td 12/06/2008   Tdap 11/29/2011, 10/16/2021   Zoster Recombinant(Shingrix) 02/24/2019, 05/27/2019   Zoster, Live 11/03/2012    Past Medical History:  Diagnosis Date   ALLERGIC RHINITIS 10/08/2007   Qualifier: Diagnosis of  By: Autry Legions MD, Alveda Aures    BENIGN PROSTATIC HYPERTROPHY 09/28/2010   Qualifier: Diagnosis of  By: Autry Legions  MD, Alveda Aures    Bipolar affective disorder (HCC) 09/30/2012   COLONIC POLYPS, HX OF 10/08/2007   Qualifier: Diagnosis of  By: Autry Legions MD, Alveda Aures    Complication of anesthesia    sleeps awhile afterwards per patient   CONSTIPATION,  CHRONIC, HX OF 08/10/2007   Qualifier: Diagnosis of  By: Jarold Merlin    DIABETES MELLITUS, TYPE II 10/08/2007   Qualifier: Diagnosis of  By: Autry Legions MD, Alveda Aures    ERECTILE DYSFUNCTION 10/08/2007   Qualifier: Diagnosis of  By: Autry Legions MD, Alveda Aures    HYPERLIPIDEMIA 10/08/2007   Qualifier: Diagnosis of  By: Autry Legions MD, Alveda Aures    HYPERTENSION 03/20/2009   Qualifier: Diagnosis of  By: Autry Legions MD, Alveda Aures    PTSD (post-traumatic stress disorder) 09/30/2012   SPINAL STENOSIS, LUMBAR 10/08/2007   Per Dr Larrie Po - [facility]   Qualifier: Diagnosis of  By: Autry Legions MD, Alveda Aures      Tobacco History: Social History   Tobacco Use  Smoking Status Former   Current packs/day: 0.00   Average packs/day: 1.5 packs/day for 41.0 years (61.5 ttl pk-yrs)   Types: Cigarettes   Start date: 19   Quit date: 2013   Years since quitting: 12.3  Smokeless Tobacco Never   Counseling given: Not Answered   Continue to not smoke  No facility-administered encounter medications on file as of 04/01/2024.   Outpatient Encounter Medications as of 04/01/2024  Medication Sig   albuterol  (VENTOLIN  HFA) 108 (90 Base) MCG/ACT inhaler Inhale 2 puffs into the lungs every 6 (six) hours as needed for wheezing or shortness of breath.   amLODipine  (NORVASC ) 10 MG tablet Take 10 mg by mouth every evening.   aspirin  81 MG chewable tablet Chew 1 tablet (81 mg total) by mouth daily.   atorvastatin  (LIPITOR) 40 MG tablet Take 1 tablet (40 mg total) by mouth daily.   Cholecalciferol  (VITAMIN D ) 2000 UNITS CAPS Take 1 capsule by mouth daily.   dextromethorphan -guaiFENesin  (TUSSIN DM) 10-100 MG/5ML liquid Take 5 mLs by mouth every 4 (four) hours as needed for cough.   fluticasone -salmeterol  (WIXELA INHUB) 250-50 MCG/ACT AEPB ONE puff in the in morning and ONE puff in the evening   furosemide (LASIX) 20 MG tablet Take 20 mg by mouth as needed for edema.   gabapentin  (NEURONTIN ) 300 MG capsule Take 300 mg by mouth 2 (two) times daily.   guaiFENesin  (MUCINEX ) 600 MG 12 hr tablet Take 1 tablet (600 mg total) by mouth 2 (two) times daily as needed for to loosen phlegm or cough.   insulin  glargine (LANTUS ) 100 UNIT/ML injection 20 Units.   ipratropium-albuterol  (DUONEB) 0.5-2.5 (3) MG/3ML SOLN Take 3 mLs by nebulization every 6 (six) hours as needed.   JARDIANCE  10 MG TABS tablet Take 10 mg by mouth daily.   losartan  (COZAAR ) 25 MG tablet Take 25 mg by mouth daily. 1/2 tab daily   metFORMIN  (GLUCOPHAGE ) 1000 MG tablet Take 1,000 mg by mouth 2 (two) times daily.   metoprolol  succinate (TOPROL -XL) 100 MG 24 hr tablet Take 100 mg by mouth daily. Take with or immediately following a meal.   predniSONE  (DELTASONE ) 10 MG tablet Take 4 tablets (40 mg total) by mouth daily with breakfast for 5 days, THEN 3 tablets (30 mg total) daily with breakfast for 5 days, THEN 2 tablets (20 mg total) daily with breakfast for 5 days, THEN 1 tablet (10 mg total) daily with breakfast for 5 days. 4 tabs for  days, then 3 tabs for 2 days, 2 tabs for 2 days, then 1 tab for 2 days, then stop.   psyllium (METAMUCIL SMOOTH TEXTURE) 58.6 % powder Take 1 packet by mouth daily.   Pyridoxine  HCl (VITAMIN B6 PO) Take  1 tablet by mouth daily.    ranolazine  (RANEXA ) 500 MG 12 hr tablet Take 1 tablet (500 mg total) by mouth 2 (two) times daily.   Semaglutide ,0.25 or 0.5MG /DOS, (OZEMPIC , 0.25 OR 0.5 MG/DOSE,) 2 MG/1.5ML SOPN Inject 0.5 mg into the skin once a week. (Patient not taking: Reported on 04/02/2024)   Testosterone 20.25 MG/ACT (1.62%) GEL testosterone 20.25 mg/1.25 gram per pump act.(1.62 %) transdermal gel   Tiotropium Bromide  Monohydrate (SPIRIVA  RESPIMAT) 2.5 MCG/ACT AERS Inhale 2 puffs into the lungs daily.    [DISCONTINUED] sitaGLIPtin  (JANUVIA ) 100 MG tablet Take 100 mg by mouth daily.   famotidine  (PEPCID ) 20 MG tablet Take 1 tablet (20 mg total) by mouth at bedtime.   sucralfate  (CARAFATE ) 1 g tablet Take 1 tablet (1 g total) by mouth with breakfast, with lunch, and with evening meal.     Review of Systems  Review of Systems  No chest pain with exertion.  No orthopnea or PND.  Comprehensive review of systems otherwise negative. Physical Exam  BP (!) 114/58 (BP Location: Left Arm, Patient Position: Sitting, Cuff Size: Large)   Pulse 78   Ht 5\' 8"  (1.727 m)   Wt 237 lb (107.5 kg)   SpO2 94%   BMI 36.04 kg/m   Wt Readings from Last 5 Encounters:  04/03/24 234 lb 14.4 oz (106.5 kg)  04/01/24 237 lb (107.5 kg)  03/24/24 237 lb 3.2 oz (107.6 kg)  02/09/24 246 lb 8 oz (111.8 kg)  01/14/24 248 lb 4.8 oz (112.6 kg)    BMI Readings from Last 5 Encounters:  04/03/24 35.72 kg/m  04/01/24 36.04 kg/m  03/24/24 36.07 kg/m  02/09/24 37.48 kg/m  01/14/24 37.75 kg/m     Physical Exam General: Sitting in exam chair, chronically ill-appearing Pulmonary: Rhonchi on the right, diminished throughout, no wheeze, normal work of breathing Cardiovascular: Regular rate and rhythm Neuro: Alert and oriented no focal deficits   Assessment & Plan:   Prolonged COPD exacerbation likely due to pneumonia: Worsening symptoms cough congestion shortness of breath despite 2 weeks of antibiotics in 1 week Augmentin , 1 week levofloxacin.  Chest imaging reportedly worse, cannot review these images performed at [facility], oncologist office.  Fortunately his hypoxemia is stable.  He has increased somnolence, sleepiness, worry about early sepsis.  Instructed to go to the ED and anticipate admission for IV antibiotics given failure of outpatient antibiotics.  He likely would benefit from repeat imaging as well.   No follow-ups on file.   Guerry Leek, MD 04/03/2024

## 2024-04-03 NOTE — Progress Notes (Addendum)
 RT provided PRN treatment and instructed Flutter in attempt to facilitate a productive cough. Sputum cup has been left at PT bedside. RN aware.

## 2024-04-03 NOTE — Plan of Care (Signed)
   Problem: Education: Goal: Knowledge of General Education information will improve Description Including pain rating scale, medication(s)/side effects and non-pharmacologic comfort measures Outcome: Progressing   Problem: Health Behavior/Discharge Planning: Goal: Ability to manage health-related needs will improve Outcome: Progressing

## 2024-04-03 NOTE — Plan of Care (Signed)
  Problem: Education: Goal: Knowledge of General Education information will improve Description: Including pain rating scale, medication(s)/side effects and non-pharmacologic comfort measures Outcome: Progressing   Problem: Health Behavior/Discharge Planning: Goal: Ability to manage health-related needs will improve Outcome: Progressing   Problem: Clinical Measurements: Goal: Ability to maintain clinical measurements within normal limits will improve Outcome: Progressing Goal: Diagnostic test results will improve Outcome: Progressing Goal: Cardiovascular complication will be avoided Outcome: Progressing   Problem: Activity: Goal: Risk for activity intolerance will decrease Outcome: Progressing   

## 2024-04-03 NOTE — Plan of Care (Signed)

## 2024-04-03 NOTE — Progress Notes (Signed)
 PROGRESS NOTE    Jose Patrick  ZOX:[id number] DOB: February 12, 1951 DOA: 04/01/2024 PCP: [name]   Brief Narrative:  73 y.o. male with medical history significant for COPD, chronic hypoxic respiratory failure on 2-3 L [place], lung cancer s/p LUL lobectomy in 2013 and presumed RLL lung cancer s/p recent radiation, OSA on BiPAP, T2DM, CAD, HTN and recent recurrent pneumonia with recent treatment of persistent respiratory symptoms with Levaquin and steroids by his pulmonologist presented with worsening shortness of breath and cough.  On presentation, he was saturating in the 90s on 2 L oxygen.  COVID/influenza/RSV PCR negative.  Chest x-ray showed worsening bilateral airspace opacities most pronounced in the lower lobes. CTA PE study negative for PE but showed right lower lobe and inferior right upper lobe pneumonia.  Patient was started on IV antibiotics. Pulmonary consulted.  Assessment & Plan:   Recurrent pneumonia -Patient has had recurrent pneumonia over the last few months treated with multiple rounds of antibiotics.  Was recently on Levaquin and steroids as an outpatient by his pulmonologist. - Imaging as above.  COVID/influenza/RSV PCR negative.  Respiratory virus panel PCR negative.  Procalcitonin less than 0.1.   -Pulmonary following.  Continue Zosyn . Follow recommendations.  COPD with acute exacerbation Chronic hypoxic respiratory failure Pulmonary fibrosis - Continue Solu-Medrol .  Continue current inhaled and nebulized regimen.  Follow pulmonary recommendations  Lung cancer - Hx of lung cancer s/p LUL lobectomy in 2013 - Presumed RLL lung cancer s/p completion of radiation therapy in January 2025 - Follows with [facility] for serial imaging  Hypertension - Blood pressure mostly stable.  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  Carb modified diet.  Metformin  on hold.  Continue gabapentin  and pyridoxine   CAD Hyperlipidemia - Stable.  Continue aspirin , Ranexa , Zetia  and statin  OSA - Unable to tolerate CPAP due to feeling of suffocation  Obesity class II - Outpatient follow-up  GERD - Continue famotidine  and PPI   DVT prophylaxis: Lovenox  Code Status: Full Family Communication: Wife at bedside on 04/02/2024.  None at bedside today. Disposition Plan: Status is: Inpatient Remains inpatient appropriate because: Of severity of illness    Consultants: Pulmonary  Procedures: None  Antimicrobials:  Anti-infectives (From admission, onward)    Start     Dose/Rate Route Frequency Ordered Stop   04/02/24 2030  cefTRIAXone  (ROCEPHIN ) 1 g in sodium chloride  0.9 % 100 mL IVPB  Status:  Discontinued        1 g 200 mL/hr over 30 Minutes Intravenous Every 24 hours 04/02/24 0326 04/02/24 0326   04/02/24 2030  cefTRIAXone  (ROCEPHIN ) 1 g in sodium chloride  0.9 % 100 mL IVPB  Status:  Discontinued        1 g 200 mL/hr over 30 Minutes Intravenous Every 24 hours 04/02/24 0326 04/02/24 0848   04/02/24 2000  azithromycin  (ZITHROMAX ) tablet 500 mg  Status:  Discontinued        500 mg Oral Daily 04/02/24 0326 04/02/24 0848   04/02/24 1400  piperacillin -tazobactam (ZOSYN ) IVPB 3.375 g        3.375 g 12.5 mL/hr over 240 Minutes Intravenous Every 8 hours 04/02/24 0857     04/02/24 0930  piperacillin -tazobactam (ZOSYN ) IVPB 3.375 g        3.375 g 100 mL/hr over 30 Minutes Intravenous  Once 04/02/24 0856 04/02/24 0946   04/01/24 2030  cefTRIAXone  (ROCEPHIN )  2 g in sodium chloride  0.9 % 100 mL IVPB        2 g 200 mL/hr over 30 Minutes Intravenous Once 04/01/24 2019 04/01/24 2210   04/01/24 2030  azithromycin  (ZITHROMAX ) 500 mg in sodium chloride  0.9 % 250 mL IVPB        500 mg 250 mL/hr over 60 Minutes Intravenous  Once 04/01/24 2019 04/01/24 2328        Subjective: Patient seen and examined at  bedside.  Feels slightly better.  Still short of breath with exertion. No fever, vomiting or abdominal pain reported.   Objective: Vitals:   04/02/24 1134 04/02/24 1838 04/02/24 2152 04/03/24 0507  BP: 135/67  128/67 127/68  Pulse: 73  (!) 58 (!) 57  Resp: 18  16 17   Temp: (!) 97.5 F (36.4 C)  97.7 F (36.5 C) 97.7 F (36.5 C)  TempSrc: Oral  Oral Oral  SpO2: 96% 95% 95% 95%  Weight:    106.5 kg  Height:        Intake/Output Summary (Last 24 hours) at 04/03/2024 0803 Last data filed at 04/03/2024 0700 Gross per 24 hour  Intake 300 ml  Output 3425 ml  Net -3125 ml   Filed Weights   04/02/24 0200 04/03/24 0507  Weight: 107.7 kg 106.5 kg    Examination:  General: On 2-3L oxygen via nasal cannula.  No distress ENT/neck: No thyromegaly.  JVD is not elevated  respiratory: Decreased breath sounds at bases bilaterally with some crackles; no wheezing  CVS: S1-S2 heard, rate controlled currently Abdominal: Soft, obese, nontender, slightly distended; no organomegaly, bowel sounds are heard Extremities: Trace lower extremity edema; no cyanosis  CNS: Awake and alert.  No focal neurologic deficit.  Moves extremities Lymph: No obvious lymphadenopathy Skin: No obvious ecchymosis/lesions  psych: Affect, judgment and mood are normal  musculoskeletal: No obvious joint swelling/deformity     Data Reviewed: I have personally reviewed following labs and imaging studies  CBC: Recent Labs  Lab 04/01/24 2125 04/02/24 0337  WBC 9.1 8.1  NEUTROABS 5.9  --   HGB 15.8 15.4  HCT 49.3 49.5  MCV 81.8 82.9  PLT 270 247   Basic Metabolic Panel: Recent Labs  Lab 04/01/24 2125 04/02/24 0337  NA 137 136  K 3.8 4.3  CL 104 103  CO2 25 26  GLUCOSE 176* 204*  BUN 20 20  CREATININE 1.07 1.09  CALCIUM  9.1 8.9   GFR: Estimated Creatinine Clearance: 72.5 mL/min (by C-G formula based on SCr of 1.09 mg/dL). Liver Function Tests: Recent Labs  Lab 04/01/24 2125  AST 18  ALT 28   ALKPHOS 58  BILITOT 0.8  PROT 5.8*  ALBUMIN 2.9*   No results for input(s): "LIPASE", "AMYLASE" in the last 168 hours. No results for input(s): "AMMONIA" in the last 168 hours. Coagulation Profile: Recent Labs  Lab 04/01/24 2134  INR 1.0   Cardiac Enzymes: No results for input(s): "CKTOTAL", "CKMB", "CKMBINDEX", "TROPONINI" in the last 168 hours. BNP (last 3 results) No results for input(s): "PROBNP" in the last 8760 hours. HbA1C: Recent Labs    04/03/24 0352  HGBA1C 7.8*   CBG: Recent Labs  Lab 04/02/24 0843 04/02/24 1135 04/02/24 1638 04/02/24 2150 04/03/24 0757  GLUCAP 203* 238* 228* 248* 252*   Lipid Profile: No results for input(s): "CHOL", "HDL", "LDLCALC", "TRIG", "CHOLHDL", "LDLDIRECT" in the last 72 hours. Thyroid Function Tests: No results for input(s): "TSH", "T4TOTAL", "FREET4", "T3FREE", "THYROIDAB" in the last 72 hours. Anemia  Panel: No results for input(s): "VITAMINB12", "FOLATE", "FERRITIN", "TIBC", "IRON", "RETICCTPCT" in the last 72 hours. Sepsis Labs: Recent Labs  Lab 04/01/24 2133 04/02/24 0329  PROCALCITON  --  <0.10  LATICACIDVEN 1.3  --     Recent Results (from the past 240 hours)  Resp panel by RT-PCR (RSV, Flu A&B, Covid) Anterior Nasal Swab     Status: None   Collection Time: 04/01/24  9:13 PM   Specimen: Anterior Nasal Swab  Result Value Ref Range Status   SARS Coronavirus 2 by RT PCR NEGATIVE NEGATIVE Final    Comment: (NOTE) SARS-CoV-2 target nucleic acids are NOT DETECTED.  The SARS-CoV-2 RNA is generally detectable in upper respiratory specimens during the acute phase of infection. The lowest concentration of SARS-CoV-2 viral copies this assay can detect is 138 copies/mL. A negative result does not preclude SARS-Cov-2 infection and should not be used as the sole basis for treatment or other patient management decisions. A negative result may occur with  improper specimen collection/handling, submission of specimen  other than nasopharyngeal swab, presence of viral mutation(s) within the areas targeted by this assay, and inadequate number of viral copies(<138 copies/mL). A negative result must be combined with clinical observations, patient history, and epidemiological information. The expected result is Negative.  Fact Sheet for Patients:  BloggerCourse.com  Fact Sheet for Healthcare Providers:  SeriousBroker.it  This test is no t yet approved or cleared by the [place]  FDA and  has been authorized for detection and/or diagnosis of SARS-CoV-2 by FDA under an Emergency Use Authorization (EUA). This EUA will remain  in effect (meaning this test can be used) for the duration of the COVID-19 declaration under Section 564(b)(1) of the Act, 21 U.S.C.section 360bbb-3(b)(1), unless the authorization is terminated  or revoked sooner.       Influenza A by PCR NEGATIVE NEGATIVE Final   Influenza B by PCR NEGATIVE NEGATIVE Final    Comment: (NOTE) The Xpert Xpress SARS-CoV-2/FLU/RSV plus assay is intended as an aid in the diagnosis of influenza from Nasopharyngeal swab specimens and should not be used as a sole basis for treatment. Nasal washings and aspirates are unacceptable for Xpert Xpress SARS-CoV-2/FLU/RSV testing.  Fact Sheet for Patients: BloggerCourse.com  Fact Sheet for Healthcare Providers: SeriousBroker.it  This test is not yet approved or cleared by the [place]  FDA and has been authorized for detection and/or diagnosis of SARS-CoV-2 by FDA under an Emergency Use Authorization (EUA). This EUA will remain in effect (meaning this test can be used) for the duration of the COVID-19 declaration under Section 564(b)(1) of the Act, 21 U.S.C. section 360bbb-3(b)(1), unless the authorization is terminated or revoked.     Resp Syncytial Virus by PCR NEGATIVE NEGATIVE Final     Comment: (NOTE) Fact Sheet for Patients: BloggerCourse.com  Fact Sheet for Healthcare Providers: SeriousBroker.it  This test is not yet approved or cleared by the [place]  FDA and has been authorized for detection and/or diagnosis of SARS-CoV-2 by FDA under an Emergency Use Authorization (EUA). This EUA will remain in effect (meaning this test can be used) for the duration of the COVID-19 declaration under Section 564(b)(1) of the Act, 21 U.S.C. section 360bbb-3(b)(1), unless the authorization is terminated or revoked.  Performed at [facility], [address] [place], [place] [address]   Respiratory (~20 pathogens) panel by PCR     Status: None   Collection Time: 04/01/24  9:13 PM   Specimen: Nasopharyngeal Swab; Respiratory  Result Value Ref  Range Status   Adenovirus NOT DETECTED NOT DETECTED Final   Coronavirus 229E NOT DETECTED NOT DETECTED Final    Comment: (NOTE) The Coronavirus on the Respiratory Panel, DOES NOT test for the novel  Coronavirus (2019 nCoV)    Coronavirus HKU1 NOT DETECTED NOT DETECTED Final   Coronavirus NL63 NOT DETECTED NOT DETECTED Final   Coronavirus OC43 NOT DETECTED NOT DETECTED Final   Metapneumovirus NOT DETECTED NOT DETECTED Final   Rhinovirus / Enterovirus NOT DETECTED NOT DETECTED Final   Influenza A NOT DETECTED NOT DETECTED Final   Influenza B NOT DETECTED NOT DETECTED Final   Parainfluenza Virus 1 NOT DETECTED NOT DETECTED Final   Parainfluenza Virus 2 NOT DETECTED NOT DETECTED Final   Parainfluenza Virus 3 NOT DETECTED NOT DETECTED Final   Parainfluenza Virus 4 NOT DETECTED NOT DETECTED Final   Respiratory Syncytial Virus NOT DETECTED NOT DETECTED Final   Bordetella pertussis NOT DETECTED NOT DETECTED Final   Bordetella Parapertussis NOT DETECTED NOT DETECTED Final   Chlamydophila pneumoniae NOT DETECTED NOT DETECTED Final   Mycoplasma pneumoniae NOT DETECTED NOT  DETECTED Final    Comment: Performed at [facility], [address], [place], [place] [address]  Blood Culture (routine x 2)     Status: None (Preliminary result)   Collection Time: 04/01/24  9:25 PM   Specimen: BLOOD  Result Value Ref Range Status   Specimen Description   Final    BLOOD RIGHT ANTECUBITAL Performed at [facility], [address], [place], [place] [address]    Special Requests   Final    BOTTLES DRAWN AEROBIC AND ANAEROBIC Blood Culture adequate volume Performed at [facility], [address], [place], [place] [address]    Culture   Final    NO GROWTH 1 DAY Performed at [facility], [address], [place], [place] [address]    Report Status PENDING  Incomplete  Blood Culture (routine x 2)     Status: None (Preliminary result)   Collection Time: 04/01/24  9:58 PM   Specimen: BLOOD RIGHT HAND  Result Value Ref Range Status   Specimen Description   Final    BLOOD RIGHT HAND Performed at [facility], [address], [place], [place] [address]    Special Requests   Final    BOTTLES DRAWN AEROBIC AND ANAEROBIC Blood Culture results may not be optimal due to an inadequate volume of blood received in culture bottles Performed at [facility], [address] [place], [place] [address]    Culture   Final    NO GROWTH 1 DAY Performed at [facility], [address] [place], [place] [address]    Report Status PENDING  Incomplete  MRSA Next Gen by PCR, Nasal     Status: None   Collection Time: 04/02/24  6:10 AM   Specimen: Nasopharyngeal Swab; Nasal Swab  Result Value Ref Range Status   MRSA by PCR Next Gen NOT DETECTED NOT DETECTED Final    Comment: (NOTE) The GeneXpert MRSA Assay (FDA approved for NASAL specimens only), is one component of a comprehensive MRSA colonization surveillance program. It is not intended to diagnose MRSA infection nor to guide or monitor treatment for  MRSA infections. Test performance is not FDA approved in patients less than 31 years old. Performed at [facility], [address] [place], [place] [address]   Expectorated Sputum Assessment w Gram Stain, Rflx  to Resp Cult     Status: None   Collection Time: 04/02/24 10:00 PM   Specimen: Sputum  Result Value Ref Range Status   Specimen Description SPUTUM  Final   Special Requests NONE  Final   Sputum evaluation   Final    Sputum specimen not acceptable for testing.  Please recollect.   Performed at [facility], [address], [place], [place] [address]    Report Status 04/02/2024 FINAL  Final         Radiology Studies: CT Angio Chest PE W and/or Wo Contrast Result Date: 04/01/2024 CLINICAL DATA:  Pulmonary embolism (PE) suspected, high prob. Pneumonia, shortness of breath. EXAM: CT ANGIOGRAPHY CHEST WITH CONTRAST TECHNIQUE: Multidetector CT imaging of the chest was performed using the standard protocol during bolus administration of intravenous contrast. Multiplanar CT image reconstructions and MIPs were obtained to evaluate the vascular anatomy. RADIATION DOSE REDUCTION: This exam was performed according to the departmental dose-optimization program which includes automated exposure control, adjustment of the mA and/or kV according to patient size and/or use of iterative reconstruction technique. CONTRAST:  75mL OMNIPAQUE  IOHEXOL  350 MG/ML SOLN COMPARISON:  12/02/2023 FINDINGS: Cardiovascular: No filling defects in the pulmonary arteries to suggest pulmonary emboli. Heart is normal size. Aorta is normal caliber. Scattered coronary artery and aortic calcifications. Mediastinum/Nodes: Mildly enlarged right paratracheal lymph node measuring 11 mm, likely reactive. No axillary or hilar adenopathy. Trachea and esophagus are unremarkable. Thyroid unremarkable. Lungs/Pleura: Emphysema. Dense consolidation throughout the right lower lobe and in the inferior  right upper lobe, progressed since prior study concerning for pneumonia. Numerous small nodules throughout the left lung, new since prior study. These are all 5 mm or less in size. No effusions. Upper Abdomen: No acute findings Musculoskeletal: Chest wall soft tissues are unremarkable. No acute bony abnormality. Review of the MIP images confirms the above findings. IMPRESSION: No evidence of pulmonary embolus. Dense consolidation in the right lower lobe and inferior right upper lobe concerning for pneumonia. Numerous scattered small nodules throughout the left lung, 5 mm or less in size, new since prior study. Recommend follow-up CT in 3 3-6 months to assess stability. Coronary artery disease. Aortic Atherosclerosis (ICD10-I70.0) and Emphysema (ICD10-J43.9). Electronically Signed   By: Janeece Mechanic M.D.   On: 04/01/2024 23:06   DG Chest Port 1 View Result Date: 04/01/2024 CLINICAL DATA:  Questionable sepsis - evaluate for abnormality. History of pneumonia. Shortness of breath. EXAM: PORTABLE CHEST 1 VIEW COMPARISON:  12/02/2023 FINDINGS: Diffuse bilateral airspace disease, worsening since prior study. These are most pronounced in the lower lobes. Heart is borderline in size. No definite effusions. No pneumothorax. No acute bony abnormality. IMPRESSION: Worsening bilateral airspace disease, most pronounced in the lower lobes. Findings could reflect edema or infection. Electronically Signed   By: Janeece Mechanic M.D.   On: 04/01/2024 22:15        Scheduled Meds:  amLODipine   10 mg Oral QPM   arformoterol   15 mcg Nebulization BID   aspirin   81 mg Oral Daily   atorvastatin   40 mg Oral Daily   budesonide  (PULMICORT ) nebulizer solution  0.25 mg Nebulization BID   cholecalciferol   2,000 Units Oral Daily   dextromethorphan -guaiFENesin   2 tablet Oral BID   empagliflozin   10 mg Oral Daily   enoxaparin  (LOVENOX ) injection  40 mg Subcutaneous Q24H   ezetimibe   10 mg Oral Daily   famotidine   20 mg Oral QHS    ferrous sulfate   325 mg Oral BID  WC   fluticasone   2 spray Each Nare Daily   gabapentin   300 mg Oral BID   insulin  aspart  0-15 Units Subcutaneous TID WC   insulin  aspart  0-5 Units Subcutaneous QHS   insulin  glargine-yfgn  20 Units Subcutaneous QHS   losartan   25 mg Oral Daily   methylPREDNISolone  (SOLU-MEDROL ) injection  40 mg Intravenous Q12H   metoprolol  succinate  100 mg Oral Daily   pantoprazole   40 mg Oral Daily   pyridOXINE   25 mg Oral Daily   ranolazine   500 mg Oral BID   revefenacin   175 mcg Nebulization Daily   Continuous Infusions:  piperacillin -tazobactam (ZOSYN )  IV 3.375 g (04/03/24 0504)          Audria Leather, MD [facility] 04/03/2024, 8:03 AM

## 2024-04-04 DIAGNOSIS — J9611 Chronic respiratory failure with hypoxia: Secondary | ICD-10-CM | POA: Diagnosis not present

## 2024-04-04 DIAGNOSIS — J189 Pneumonia, unspecified organism: Secondary | ICD-10-CM | POA: Diagnosis not present

## 2024-04-04 LAB — GLUCOSE, CAPILLARY
Glucose-Capillary: 206 mg/dL — ABNORMAL HIGH (ref 70–99)
Glucose-Capillary: 200 mg/dL — ABNORMAL HIGH (ref 70–99)
Glucose-Capillary: 345 mg/dL — ABNORMAL HIGH (ref 70–99)
Glucose-Capillary: 288 mg/dL — ABNORMAL HIGH (ref 70–99)

## 2024-04-04 LAB — LEGIONELLA PNEUMOPHILA SEROGP 1 UR AG: L. pneumophila Serogp 1 Ur Ag: NEGATIVE

## 2024-04-04 MED ORDER — ENSURE ENLIVE PO LIQD
237.0000 mL | Freq: Two times a day (BID) | ORAL | Status: DC
  Administered 2024-04-05 – 2024-04-07 (×5): 237 mL via ORAL

## 2024-04-04 NOTE — Progress Notes (Signed)
 PROGRESS NOTE    Jose Patrick  XBJ:[id number] DOB: 06-Aug-1951 DOA: 04/01/2024 PCP: [name]   Brief Narrative:  73 y.o. male with medical history significant for COPD, chronic hypoxic respiratory failure on 2-3 L [place], lung cancer s/p LUL lobectomy in 2013 and presumed RLL lung cancer s/p recent radiation, OSA on BiPAP, T2DM, CAD, HTN and recent recurrent pneumonia with recent treatment of persistent respiratory symptoms with Levaquin and steroids by his pulmonologist presented with worsening shortness of breath and cough.  On presentation, he was saturating in the 90s on 2 L oxygen.  COVID/influenza/RSV PCR negative.  Chest x-ray showed worsening bilateral airspace opacities most pronounced in the lower lobes. CTA PE study negative for PE but showed right lower lobe and inferior right upper lobe pneumonia.  Patient was started on IV antibiotics. Pulmonary consulted.  Assessment & Plan:   Recurrent pneumonia -Patient has had recurrent pneumonia over the last few months treated with multiple rounds of antibiotics.  Was recently on Levaquin and steroids as an outpatient by his pulmonologist. - Imaging as above.  COVID/influenza/RSV PCR negative.  Respiratory virus panel PCR negative.  Procalcitonin less than 0.1.   -Pulmonary following.  Continue Zosyn . Follow recommendations.  COPD with acute exacerbation Chronic hypoxic respiratory failure Pulmonary fibrosis - Solu-Medrol  has been switched to oral prednisone .  Continue current inhaled and nebulized regimen.  Follow pulmonary recommendations  Lung cancer - Hx of lung cancer s/p LUL lobectomy in 2013 - Presumed RLL lung cancer s/p completion of radiation therapy in January 2025 - Follows with [facility] for serial imaging  Hypertension - Blood pressure mostly stable.  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  Carb modified diet.  Metformin  on hold.  Continue gabapentin  and pyridoxine   CAD Hyperlipidemia - Stable.  Continue aspirin , Ranexa , Zetia  and statin  OSA - Unable to tolerate CPAP due to feeling of suffocation  Obesity class II - Outpatient follow-up  GERD - Continue famotidine  and PPI   DVT prophylaxis: Lovenox  Code Status: Full Family Communication: Wife at bedside on 04/02/2024.  None at bedside today. Disposition Plan: Status is: Inpatient Remains inpatient appropriate because: Of severity of illness    Consultants: Pulmonary  Procedures: None  Antimicrobials:  Anti-infectives (From admission, onward)    Start     Dose/Rate Route Frequency Ordered Stop   04/02/24 2030  cefTRIAXone  (ROCEPHIN ) 1 g in sodium chloride  0.9 % 100 mL IVPB  Status:  Discontinued        1 g 200 mL/hr over 30 Minutes Intravenous Every 24 hours 04/02/24 0326 04/02/24 0326   04/02/24 2030  cefTRIAXone  (ROCEPHIN ) 1 g in sodium chloride  0.9 % 100 mL IVPB  Status:  Discontinued        1 g 200 mL/hr over 30 Minutes Intravenous Every 24 hours 04/02/24 0326 04/02/24 0848   04/02/24 2000  azithromycin  (ZITHROMAX ) tablet 500 mg  Status:  Discontinued        500 mg Oral Daily 04/02/24 0326 04/02/24 0848   04/02/24 1400  piperacillin -tazobactam (ZOSYN ) IVPB 3.375 g        3.375 g 12.5 mL/hr over 240 Minutes Intravenous Every 8 hours 04/02/24 0857     04/02/24 0930  piperacillin -tazobactam (ZOSYN ) IVPB 3.375 g        3.375 g 100 mL/hr over 30 Minutes Intravenous  Once 04/02/24 0856 04/02/24 0946   04/01/24  2030  cefTRIAXone  (ROCEPHIN ) 2 g in sodium chloride  0.9 % 100 mL IVPB        2 g 200 mL/hr over 30 Minutes Intravenous Once 04/01/24 2019 04/01/24 2210   04/01/24 2030  azithromycin  (ZITHROMAX ) 500 mg in sodium chloride  0.9 % 250 mL IVPB        500 mg 250 mL/hr over 60 Minutes Intravenous  Once 04/01/24 2019 04/01/24 2328         Subjective: Patient seen and examined at bedside.  Feels slightly better.  Still having intermittent cough.  No abdominal pain, fever or vomiting reported.   Objective: Vitals:   04/03/24 1350 04/03/24 2131 04/04/24 0500 04/04/24 0515  BP: 119/65 (!) 110/53  126/60  Pulse: 71 62  (!) 54  Resp: 18 16  16   Temp: 97.8 F (36.6 C) 97.6 F (36.4 C)  97.6 F (36.4 C)  TempSrc: Oral Oral    SpO2: 95% 95%  98%  Weight:   107 kg   Height:        Intake/Output Summary (Last 24 hours) at 04/04/2024 0825 Last data filed at 04/04/2024 0810 Gross per 24 hour  Intake 874.83 ml  Output 2850 ml  Net -1975.17 ml   Filed Weights   04/02/24 0200 04/03/24 0507 04/04/24 0500  Weight: 107.7 kg 106.5 kg 107 kg    Examination:  General: No acute distress.  Remains on 2 to 3 L oxygen by nasal cannula.   ENT/neck: No obvious neck masses or JVD elevation noted respiratory: Bilateral decreased breath sounds at bases with scattered crackles CVS: Rate mostly controlled; S1 and S2 are heard  abdominal: Soft, obese, nontender, distended mildly; no organomegaly, bowel sounds are heard normally Extremities: No clubbing; mild lower extremity edema present  CNS: Alert and oriented.  No focal neurologic deficit.  Able to move extremities Lymph: No obvious palpable lymphadenopathy Skin: No obvious rashes/petechiae psych: Flat affect.  Not agitated musculoskeletal: No obvious joint tenderness/erythema     Data Reviewed: I have personally reviewed following labs and imaging studies  CBC: Recent Labs  Lab 04/01/24 2125 04/02/24 0337  WBC 9.1 8.1  NEUTROABS 5.9  --   HGB 15.8 15.4  HCT 49.3 49.5  MCV 81.8 82.9  PLT 270 247   Basic Metabolic Panel: Recent Labs  Lab 04/01/24 2125 04/02/24 0337  NA 137 136  K 3.8 4.3  CL 104 103  CO2 25 26  GLUCOSE 176* 204*  BUN 20 20  CREATININE 1.07 1.09  CALCIUM  9.1 8.9   GFR: Estimated Creatinine Clearance: 72.6 mL/min (by C-G formula based  on SCr of 1.09 mg/dL). Liver Function Tests: Recent Labs  Lab 04/01/24 2125  AST 18  ALT 28  ALKPHOS 58  BILITOT 0.8  PROT 5.8*  ALBUMIN 2.9*   No results for input(s): "LIPASE", "AMYLASE" in the last 168 hours. No results for input(s): "AMMONIA" in the last 168 hours. Coagulation Profile: Recent Labs  Lab 04/01/24 2134  INR 1.0   Cardiac Enzymes: No results for input(s): "CKTOTAL", "CKMB", "CKMBINDEX", "TROPONINI" in the last 168 hours. BNP (last 3 results) No results for input(s): "PROBNP" in the last 8760 hours. HbA1C: Recent Labs    04/03/24 0352  HGBA1C 7.8*   CBG: Recent Labs  Lab 04/03/24 0757 04/03/24 1203 04/03/24 1637 04/03/24 2130 04/04/24 0753  GLUCAP 252* 215* 358* 328* 206*   Lipid Profile: No results for input(s): "CHOL", "HDL", "LDLCALC", "TRIG", "CHOLHDL", "LDLDIRECT" in the last 72 hours. Thyroid Function  Tests: No results for input(s): "TSH", "T4TOTAL", "FREET4", "T3FREE", "THYROIDAB" in the last 72 hours. Anemia Panel: No results for input(s): "VITAMINB12", "FOLATE", "FERRITIN", "TIBC", "IRON", "RETICCTPCT" in the last 72 hours. Sepsis Labs: Recent Labs  Lab 04/01/24 2133 04/02/24 0329  PROCALCITON  --  <0.10  LATICACIDVEN 1.3  --     Recent Results (from the past 240 hours)  Resp panel by RT-PCR (RSV, Flu A&B, Covid) Anterior Nasal Swab     Status: None   Collection Time: 04/01/24  9:13 PM   Specimen: Anterior Nasal Swab  Result Value Ref Range Status   SARS Coronavirus 2 by RT PCR NEGATIVE NEGATIVE Final    Comment: (NOTE) SARS-CoV-2 target nucleic acids are NOT DETECTED.  The SARS-CoV-2 RNA is generally detectable in upper respiratory specimens during the acute phase of infection. The lowest concentration of SARS-CoV-2 viral copies this assay can detect is 138 copies/mL. A negative result does not preclude SARS-Cov-2 infection and should not be used as the sole basis for treatment or other patient management decisions. A  negative result may occur with  improper specimen collection/handling, submission of specimen other than nasopharyngeal swab, presence of viral mutation(s) within the areas targeted by this assay, and inadequate number of viral copies(<138 copies/mL). A negative result must be combined with clinical observations, patient history, and epidemiological information. The expected result is Negative.  Fact Sheet for Patients:  BloggerCourse.com  Fact Sheet for Healthcare Providers:  SeriousBroker.it  This test is no t yet approved or cleared by the [place]  FDA and  has been authorized for detection and/or diagnosis of SARS-CoV-2 by FDA under an Emergency Use Authorization (EUA). This EUA will remain  in effect (meaning this test can be used) for the duration of the COVID-19 declaration under Section 564(b)(1) of the Act, 21 U.S.C.section 360bbb-3(b)(1), unless the authorization is terminated  or revoked sooner.       Influenza A by PCR NEGATIVE NEGATIVE Final   Influenza B by PCR NEGATIVE NEGATIVE Final    Comment: (NOTE) The Xpert Xpress SARS-CoV-2/FLU/RSV plus assay is intended as an aid in the diagnosis of influenza from Nasopharyngeal swab specimens and should not be used as a sole basis for treatment. Nasal washings and aspirates are unacceptable for Xpert Xpress SARS-CoV-2/FLU/RSV testing.  Fact Sheet for Patients: BloggerCourse.com  Fact Sheet for Healthcare Providers: SeriousBroker.it  This test is not yet approved or cleared by the [place]  FDA and has been authorized for detection and/or diagnosis of SARS-CoV-2 by FDA under an Emergency Use Authorization (EUA). This EUA will remain in effect (meaning this test can be used) for the duration of the COVID-19 declaration under Section 564(b)(1) of the Act, 21 U.S.C. section 360bbb-3(b)(1), unless the authorization  is terminated or revoked.     Resp Syncytial Virus by PCR NEGATIVE NEGATIVE Final    Comment: (NOTE) Fact Sheet for Patients: BloggerCourse.com  Fact Sheet for Healthcare Providers: SeriousBroker.it  This test is not yet approved or cleared by the [place]  FDA and has been authorized for detection and/or diagnosis of SARS-CoV-2 by FDA under an Emergency Use Authorization (EUA). This EUA will remain in effect (meaning this test can be used) for the duration of the COVID-19 declaration under Section 564(b)(1) of the Act, 21 U.S.C. section 360bbb-3(b)(1), unless the authorization is terminated or revoked.  Performed at [facility], [address] [place], [place] [address]   Respiratory (~20 pathogens) panel by PCR     Status: None  Collection Time: 04/01/24  9:13 PM   Specimen: Nasopharyngeal Swab; Respiratory  Result Value Ref Range Status   Adenovirus NOT DETECTED NOT DETECTED Final   Coronavirus 229E NOT DETECTED NOT DETECTED Final    Comment: (NOTE) The Coronavirus on the Respiratory Panel, DOES NOT test for the novel  Coronavirus (2019 nCoV)    Coronavirus HKU1 NOT DETECTED NOT DETECTED Final   Coronavirus NL63 NOT DETECTED NOT DETECTED Final   Coronavirus OC43 NOT DETECTED NOT DETECTED Final   Metapneumovirus NOT DETECTED NOT DETECTED Final   Rhinovirus / Enterovirus NOT DETECTED NOT DETECTED Final   Influenza A NOT DETECTED NOT DETECTED Final   Influenza B NOT DETECTED NOT DETECTED Final   Parainfluenza Virus 1 NOT DETECTED NOT DETECTED Final   Parainfluenza Virus 2 NOT DETECTED NOT DETECTED Final   Parainfluenza Virus 3 NOT DETECTED NOT DETECTED Final   Parainfluenza Virus 4 NOT DETECTED NOT DETECTED Final   Respiratory Syncytial Virus NOT DETECTED NOT DETECTED Final   Bordetella pertussis NOT DETECTED NOT DETECTED Final   Bordetella Parapertussis NOT DETECTED NOT DETECTED Final    Chlamydophila pneumoniae NOT DETECTED NOT DETECTED Final   Mycoplasma pneumoniae NOT DETECTED NOT DETECTED Final    Comment: Performed at [facility], [address] [place], [place] [address]  Blood Culture (routine x 2)     Status: None (Preliminary result)   Collection Time: 04/01/24  9:25 PM   Specimen: BLOOD  Result Value Ref Range Status   Specimen Description   Final    BLOOD RIGHT ANTECUBITAL Performed at [facility], [address] [place], [place] [address]    Special Requests   Final    BOTTLES DRAWN AEROBIC AND ANAEROBIC Blood Culture adequate volume Performed at [facility], [address], [place], [place] [address]    Culture   Final    NO GROWTH 2 DAYS Performed at [facility], [address], [place], [place] [address]    Report Status PENDING  Incomplete  Blood Culture (routine x 2)     Status: None (Preliminary result)   Collection Time: 04/01/24  9:58 PM   Specimen: BLOOD RIGHT HAND  Result Value Ref Range Status   Specimen Description   Final    BLOOD RIGHT HAND Performed at [facility], [address], [place], [place] [address]    Special Requests   Final    BOTTLES DRAWN AEROBIC AND ANAEROBIC Blood Culture results may not be optimal due to an inadequate volume of blood received in culture bottles Performed at [facility], [address] [place], [place] [address]    Culture   Final    NO GROWTH 2 DAYS Performed at [facility], [address] [place], [place] [address]    Report Status PENDING  Incomplete  Expectorated Sputum Assessment w Gram Stain, Rflx to Resp Cult     Status: None   Collection Time: 04/01/24 11:39 PM  Result Value Ref Range Status   Specimen Description EXPECTORATED SPUTUM  Final   Special Requests NONE  Final   Sputum evaluation   Final    Sputum specimen not acceptable for testing.  Please recollect.   Baldomero Bone RN @ [phone] 04/03/24  CAL Performed at [facility], [address], [place], [place] [address]    Report Status 04/03/2024 FINAL  Final  MRSA Next Gen by PCR, Nasal     Status:  None   Collection Time: 04/02/24  6:10 AM   Specimen: Nasopharyngeal Swab; Nasal Swab  Result Value Ref Range Status   MRSA by PCR Next Gen NOT DETECTED NOT DETECTED Final    Comment: (NOTE) The GeneXpert MRSA Assay (FDA approved for NASAL specimens only), is one component of a comprehensive MRSA colonization surveillance program. It is not intended to diagnose MRSA infection nor to guide or monitor treatment for MRSA infections. Test performance is not FDA approved in patients less than 90 years old. Performed at [facility], [address] [place], [place] [address]   Expectorated Sputum Assessment w Gram Stain, Rflx to Resp Cult     Status: None   Collection Time: 04/02/24 10:00 PM   Specimen: Sputum  Result Value Ref Range Status   Specimen Description SPUTUM  Final   Special Requests NONE  Final   Sputum evaluation   Final    Sputum specimen not acceptable for testing.  Please recollect.   Performed at [facility], [address] [place], [place] [address]    Report Status 04/02/2024 FINAL  Final         Radiology Studies: No results found.       Scheduled Meds:  amLODipine   10 mg Oral QPM   arformoterol   15 mcg Nebulization BID   aspirin   81 mg Oral Daily   atorvastatin   40 mg Oral Daily   budesonide  (PULMICORT ) nebulizer solution  0.25 mg Nebulization BID   cholecalciferol   2,000 Units Oral Daily   dextromethorphan -guaiFENesin   2 tablet Oral BID   empagliflozin   10 mg Oral Daily   enoxaparin  (LOVENOX ) injection  40 mg Subcutaneous Q24H   ezetimibe   10 mg Oral Daily   famotidine   20 mg Oral QHS   ferrous sulfate   325 mg Oral BID WC   fluticasone   2 spray Each Nare Daily   gabapentin   300 mg Oral BID   insulin  aspart  0-15 Units Subcutaneous  TID WC   insulin  aspart  0-5 Units Subcutaneous QHS   insulin  glargine-yfgn  20 Units Subcutaneous QHS   losartan   25 mg Oral Daily   metoprolol  succinate  100 mg Oral Daily   pantoprazole   40 mg Oral Daily   predniSONE   40 mg Oral Q breakfast   Followed by   Cecily Cohen ON 04/09/2024] predniSONE   20 mg Oral Q breakfast   pyridOXINE   25 mg Oral Daily   ranolazine   500 mg Oral BID   revefenacin   175 mcg Nebulization Daily   Continuous Infusions:  piperacillin -tazobactam (ZOSYN )  IV 3.375 g (04/04/24 0557)          Audria Leather, MD [facility] 04/04/2024, 8:25 AM

## 2024-04-04 NOTE — Evaluation (Signed)
 Physical Therapy Evaluation Patient Details Name: Jose Patrick MRN: [id number] DOB: 06-Aug-1951 Today's Date: 04/04/2024  History of Present Illness  73 yo male admitted with Pna. Hx of COPd-O2 dep 2-3L at baseline, lung Ca, lobectomy, OSA, DM, spinal stenosis  Clinical Impression  On eval, pt was Supv level for mobility. He walked ~125 feet during session. O2 91% on 3L. Some coughing and dyspnea with activity. Will plan to follow and progress activity as tolerated.         If plan is discharge home, recommend the following:     Can travel by private vehicle        Equipment Recommendations None recommended by PT  Recommendations for Other Services       Functional Status Assessment Patient has had a recent decline in their functional status and demonstrates the ability to make significant improvements in function in a reasonable and predictable amount of time.     Precautions / Restrictions Precautions Precaution/Restrictions Comments: O2 dep @ baseline Restrictions Weight Bearing Restrictions Per Provider Order: No      Mobility  Bed Mobility               General bed mobility comments: oob in recliner    Transfers Overall transfer level: Needs assistance Equipment used: None Transfers: Sit to/from Stand Sit to Stand: Supervision                Ambulation/Gait Ambulation/Gait assistance: Supervision Gait Distance (Feet): 125 Feet Assistive device: Rolling walker (2 wheels) Gait Pattern/deviations: Step-through pattern, Decreased stride length       General Gait Details: Supv for safety. Some dyspnea and coughing with ambulation. O2 91% on 3L. Pt also walked in room without walker-intermittently held on to room furniture while going to/from bathroom  Stairs            Wheelchair Mobility     Tilt Bed    Modified Rankin (Stroke Patients Only)       Balance Overall balance assessment: Needs assistance         Standing  balance support: During functional activity, Reliant on assistive device for balance, Bilateral upper extremity supported Standing balance-Leahy Scale: Fair                               Pertinent Vitals/Pain Pain Assessment Pain Assessment: Faces Faces Pain Scale: Hurts even more Pain Location: neck, back Pain Descriptors / Indicators: Aching, Discomfort Pain Intervention(s): Monitored during session, Repositioned    Home Living Family/patient expects to be discharged to:: Private residence Living Arrangements: Spouse/significant other Available Help at Discharge: Family Type of Home: House Home Access: Stairs to enter Entrance Stairs-Rails: Right Entrance Stairs-Number of Steps: 4   Home Layout: One level;Laundry or work area in [employer]ch[employer], retail (4 wheels)      Prior Function Prior Level of Function : Independent/Modified Independent                     Extremity/Trunk Assessment   Upper Extremity Assessment Upper Extremity Assessment: Generalized weakness    Lower Extremity Assessment Lower Extremity Assessment: Generalized weakness    Cervical / Trunk Assessment Cervical / Trunk Assessment: Normal  Communication   Communication Communication: No apparent difficulties    Cognition Arousal: Alert Behavior During Therapy: WFL for tasks assessed/performed   PT - Cognitive impairments: No apparent impairments  Following commands: Intact       Cueing Cueing Techniques: Verbal cues     General Comments      Exercises     Assessment/Plan    PT Assessment Patient needs continued PT services  PT Problem List Decreased activity tolerance;Decreased balance;Decreased mobility       PT Treatment Interventions DME instruction;Gait training;Functional mobility training;Therapeutic activities;Therapeutic exercise;Patient/family education;Balance training    PT Goals (Current goals can be  found in the Care Plan section)  Acute Rehab PT Goals Patient Stated Goal: continue to get better and get home PT Goal Formulation: With patient Time For Goal Achievement: 04/18/24 Potential to Achieve Goals: Good    Frequency Min 3X/week     Co-evaluation               AM-PAC PT "6 Clicks" Mobility  Outcome Measure Help needed turning from your back to your side while in a flat bed without using bedrails?: None Help needed moving from lying on your back to sitting on the side of a flat bed without using bedrails?: None Help needed moving to and from a bed to a chair (including a wheelchair)?: A Little Help needed standing up from a chair using your arms (e.g., wheelchair or bedside chair)?: A Little Help needed to walk in hospital room?: A Little Help needed climbing 3-5 steps with a railing? : A Little 6 Click Score: 20    End of Session Equipment Utilized During Treatment: Oxygen Activity Tolerance: Patient tolerated treatment well;Patient limited by fatigue Patient left: in chair;with call bell/phone within reach;with family/visitor present   PT Visit Diagnosis: Unsteadiness on feet (R26.81)    Time: 0865-7846 PT Time Calculation (min) (ACUTE ONLY): 12 min   Charges:   PT Evaluation $PT Eval Low Complexity: 1 Low   PT General Charges $$ ACUTE PT VISIT: 1 Visit            Tanda Falter, PT Acute Rehabilitation  Office: [phone]

## 2024-04-04 NOTE — Plan of Care (Signed)

## 2024-04-04 NOTE — Progress Notes (Signed)
 NAME:  Jose Patrick, MRN:  [id number], DOB:  29-Jun-1951, LOS: 3 ADMISSION DATE:  04/01/2024, CONSULTATION DATE:  04/04/24 REFERRING MD:  TRH, CHIEF COMPLAINT:  cough   History of Present Illness:  73 year old man history of lung cancer chronic hypoxemia whom we are seeing for pneumonia.  He went to the ED at outside hospital 03/23/2024.  CT scan showed pneumonia right middle lobe right lower lobe.  There were infiltrates on 03/08/2024 reportedly as well.  From outside films.  He was placed on Augmentin .  Did not improve much seen in pulmonary clinic last week.  Escalated to levofloxacin.  No real improvement.  Chest x-ray obtained by oncologist 5/15 was reportedly worse.  He was directed to the ED.  Or call pulmonary.  He called us  and got an appointment.  On exam he was rhonchorous.  He endorsed malaise, increased fatigue and sleepiness.  Concern for developing sepsis.  His cough and shortness of breath was getting worse.  He was worse despite 2 courses of oral antibiotics was recommended to go to the ED for IV antibiotics and admission.  CT scan in the ED showed right lower lobe and right upper lobe infiltrates.  He is placed on azithromycin  and ceftriaxone .  Strep and Legionella urinary antigens ordered but not obtained.  Lower respiratory culture ordered but not obtained.  Respiratory viral panel negative.  Pertinent  Medical History  Lung cancer, emphysema, chronic hypoxemia  Significant Hospital Events: Including procedures, antibiotic start and stop dates in addition to other pertinent events   5/15 sent to ED for pulmonary office admitted with pneumonia on the right on CT scan 5/16 PCCM consultation requested  Interim History / Subjective:  Feels better.  Feels like congestion is drying up.  Still feels some in there.  Cough is dry at this point he says.  Lower respiratory culture was attempted to be obtained, this resulted not acceptable for culture.  Objective    Blood pressure  126/60, pulse (!) 54, temperature 97.6 F (36.4 C), resp. rate 16, height 5\' 8"  (1.727 m), weight 107 kg, SpO2 98%.        Intake/Output Summary (Last 24 hours) at 04/04/2024 1219 Last data filed at 04/04/2024 0810 Gross per 24 hour  Intake 874.83 ml  Output 2150 ml  Net -1275.17 ml   Filed Weights   04/02/24 0200 04/03/24 0507 04/04/24 0500  Weight: 107.7 kg 106.5 kg 107 kg    Examination: General: Lying in bed, no acute distress HENT: Atraumatic normocephalic Lungs: Coarse, junky throughout Cardiovascular: Borderline tachycardic, no murmur Abdomen: Nondistended Neuro: No focal deficits moves all extremities   Resolved problem list   Assessment and Plan   Persistent pneumonia: Noted on CT scan 03/23/2024.  Has been treated with 7 days of Augmentin  and subsequent 7 more days of levofloxacin.  With worsening symptoms of cough and shortness of breath.  CT scan demonstrates right upper lobe and right lower lobe infiltrates.  It is possible that given worsening airspace disease despite repeated course of antibiotics this could represent rapidly progressive tumor burden. --MRSA screen negative, continue Zosyn , plan for 5 days --Neb ICS/LAMA/LABA, resume home inhalers on discharge --Continue steroids, oral taper ordered --Legionella, strep urinary Ags negative --LRCx ordered not acceptable for culture  Chronic hypoxemic respiratory failure: Stable on 2 L nasal cannula.  PCCM will sign off.  Best Practice (right click and "Reselect all SmartList Selections" daily)   Per Primary  Labs   CBC: Recent Labs  Lab  04/01/24 2125 04/02/24 0337  WBC 9.1 8.1  NEUTROABS 5.9  --   HGB 15.8 15.4  HCT 49.3 49.5  MCV 81.8 82.9  PLT 270 247    Basic Metabolic Panel: Recent Labs  Lab 04/01/24 2125 04/02/24 0337  NA 137 136  K 3.8 4.3  CL 104 103  CO2 25 26  GLUCOSE 176* 204*  BUN 20 20  CREATININE 1.07 1.09  CALCIUM  9.1 8.9   GFR: Estimated Creatinine Clearance: 72.6  mL/min (by C-G formula based on SCr of 1.09 mg/dL). Recent Labs  Lab 04/01/24 2125 04/01/24 2133 04/02/24 0329 04/02/24 0337  PROCALCITON  --   --  <0.10  --   WBC 9.1  --   --  8.1  LATICACIDVEN  --  1.3  --   --     Liver Function Tests: Recent Labs  Lab 04/01/24 2125  AST 18  ALT 28  ALKPHOS 58  BILITOT 0.8  PROT 5.8*  ALBUMIN 2.9*   No results for input(s): "LIPASE", "AMYLASE" in the last 168 hours. No results for input(s): "AMMONIA" in the last 168 hours.  ABG    Component Value Date/Time   TCO2 25 12/02/2023 1616     Coagulation Profile: Recent Labs  Lab 04/01/24 2134  INR 1.0    Cardiac Enzymes: No results for input(s): "CKTOTAL", "CKMB", "CKMBINDEX", "TROPONINI" in the last 168 hours.  HbA1C: Hgb A1c MFr Bld  Date/Time Value Ref Range Status  04/03/2024 03:52 AM 7.8 (H) 4.8 - 5.6 % Final    Comment:    (NOTE) Pre diabetes:          5.7%-6.4%  Diabetes:              >6.4%  Glycemic control for   <7.0% adults with diabetes   09/30/2012 10:31 AM 7.6 (H) 4.6 - 6.5 % Final    Comment:    Glycemic Control Guidelines for People with Diabetes:Non Diabetic:  <6%Goal of Therapy: <7%Additional Action Suggested:  >8%     CBG: Recent Labs  Lab 04/03/24 1203 04/03/24 1637 04/03/24 2130 04/04/24 0753 04/04/24 1133  GLUCAP 215* 358* 328* 206* 200*    Review of Systems:   Negative unless mentioned in HPI  Past Medical History:  He,  has a past medical history of ALLERGIC RHINITIS (10/08/2007), BENIGN PROSTATIC HYPERTROPHY (09/28/2010), Bipolar affective disorder (HCC) (09/30/2012), COLONIC POLYPS, HX OF (10/08/2007), Complication of anesthesia, CONSTIPATION, CHRONIC, HX OF (08/10/2007), DIABETES MELLITUS, TYPE II (10/08/2007), ERECTILE DYSFUNCTION (10/08/2007), HYPERLIPIDEMIA (10/08/2007), HYPERTENSION (03/20/2009), PTSD (post-traumatic stress disorder) (09/30/2012), and SPINAL STENOSIS, LUMBAR (10/08/2007).   Surgical History:   Past Surgical  History:  Procedure Laterality Date   COLONOSCOPY WITH PROPOFOL  N/A 04/09/2021   Procedure: COLONOSCOPY WITH PROPOFOL ;  Surgeon: Irby Mannan, MD;  Location: [facility] ENDOSCOPY;  Service: Endoscopy;  Laterality: N/A;   cyst off right buttock     ESOPHAGOGASTRODUODENOSCOPY (EGD) WITH PROPOFOL  N/A 04/09/2021   Procedure: ESOPHAGOGASTRODUODENOSCOPY (EGD) WITH PROPOFOL ;  Surgeon: Irby Mannan, MD;  Location: [facility] ENDOSCOPY;  Service: Endoscopy;  Laterality: N/A;   PREPATELLAR BURSA EXCISION     removal right   s/p bilat hydrocelectomy  2008   x 2   TONSILLECTOMY       Social History:   reports that he quit smoking about 12 years ago. His smoking use included cigarettes. He started smoking about 53 years ago. He has a 61.5 pack-year smoking history. He has never used smokeless tobacco. He reports current alcohol use.  He reports that he does not use drugs.   Family History:  His family history includes Allergies in his sister; Anxiety disorder in his mother; Cancer in his mother; Depression in his mother; Ulcers in his mother.   Allergies Allergies  Allergen Reactions   Chlorzoxazone Swelling    Psychosis   Tramadol Other (See Comments)    Muscles Tightness  Muscles Tightness       Home Medications  Prior to Admission medications   Medication Sig Start Date End Date Taking? Authorizing Provider  albuterol  (VENTOLIN  HFA) 108 (90 Base) MCG/ACT inhaler Inhale 2 puffs into the lungs every 6 (six) hours as needed for wheezing or shortness of breath. 03/19/22  Yes Quillian Brunt, MD  amLODipine  (NORVASC ) 10 MG tablet Take 10 mg by mouth every evening. 11/23/21  Yes [provider]  aspirin  81 MG chewable tablet Chew 1 tablet (81 mg total) by mouth daily. 12/19/11  Yes Roslyn Coombe, MD  atorvastatin  (LIPITOR) 40 MG tablet Take 1 tablet (40 mg total) by mouth daily. 08/04/23  Yes Thukkani, Arun K, MD  chlorpheniramine-HYDROcodone (TUSSIONEX) 10-8 MG/5ML Take 5 mLs by mouth  every 12 (twelve) hours as needed for cough. 03/23/24  Yes [provider]  Cholecalciferol  (VITAMIN D ) 2000 UNITS CAPS Take 1 capsule by mouth daily.   Yes [provider]  cycloSPORINE (RESTASIS) 0.05 % ophthalmic emulsion Place 1 drop into both eyes 2 (two) times daily. 01/12/24  Yes [provider]  dextromethorphan -guaiFENesin  (TUSSIN DM) 10-100 MG/5ML liquid Take 5 mLs by mouth every 4 (four) hours as needed for cough. 03/19/24  Yes Antonio Baumgarten, NP  ezetimibe  (ZETIA ) 10 MG tablet Take 10 mg by mouth daily. 12/09/23  Yes [provider]  famotidine  (PEPCID ) 20 MG tablet Take 1 tablet (20 mg total) by mouth at bedtime. 02/11/22 04/02/24 Yes Marnee Sink, MD  ferrous sulfate  325 (65 FE) MG EC tablet Take 325 mg by mouth in the morning and at bedtime.   Yes [provider]  fluticasone  (FLONASE ) 50 MCG/ACT nasal spray Place 2 sprays into both nostrils daily. 08/11/23  Yes [provider]  fluticasone -salmeterol (WIXELA INHUB) 250-50 MCG/ACT AEPB ONE puff in the in morning and ONE puff in the evening 01/14/24  Yes Quillian Brunt, MD  furosemide (LASIX) 20 MG tablet Take 20 mg by mouth as needed for edema.   Yes [provider]  gabapentin  (NEURONTIN ) 300 MG capsule Take 300 mg by mouth 2 (two) times daily.   Yes [provider]  guaiFENesin  (MUCINEX ) 600 MG 12 hr tablet Take 1 tablet (600 mg total) by mouth 2 (two) times daily as needed for to loosen phlegm or cough. 03/20/22  Yes Quillian Brunt, MD  insulin  glargine (LANTUS ) 100 UNIT/ML injection 20 Units.   Yes [provider]  ipratropium-albuterol  (DUONEB) 0.5-2.5 (3) MG/3ML SOLN Take 3 mLs by nebulization every 6 (six) hours as needed. 02/09/24  Yes Parrett, Tammy S, NP  JARDIANCE  10 MG TABS tablet Take 10 mg by mouth daily. 02/23/21  Yes [provider]  losartan  (COZAAR ) 25 MG tablet Take 25 mg by mouth daily. 1/2 tab daily 01/01/23  Yes [provider]  metFORMIN  (GLUCOPHAGE ) 1000 MG tablet Take 1,000 mg by mouth 2 (two) times daily. 09/14/21  Yes [provider]  metoprolol  succinate (TOPROL -XL) 100 MG 24 hr tablet Take 100 mg by mouth daily. Take with or immediately following a meal.   Yes [provider]  omeprazole  (PRILOSEC) 40 MG capsule Take 40 mg by mouth daily. 12/09/23  Yes [provider]  predniSONE  (DELTASONE ) 10 MG tablet Take 4 tablets (40 mg total) by mouth daily with breakfast for 5 days, THEN 3 tablets (30 mg total) daily with breakfast for 5 days, THEN 2 tablets (20 mg total) daily with breakfast for 5 days, THEN 1 tablet (10 mg total) daily with breakfast for 5 days. 4 tabs for  days, then 3 tabs for 2 days, 2 tabs for 2 days, then 1 tab for 2 days, then stop. 03/24/24 04/13/24 Yes Antonio Baumgarten, NP  psyllium (METAMUCIL SMOOTH TEXTURE) 58.6 % powder Take 1 packet by mouth daily. 12/20/21  Yes Marnee Sink, MD  Pyridoxine  HCl (VITAMIN B6 PO) Take 1 tablet by mouth daily.    Yes [provider]  ranolazine  (RANEXA ) 500 MG 12 hr tablet Take 1 tablet (500 mg total) by mouth 2 (two) times daily. 02/11/23  Yes Sonny Dust, MD  Semaglutide , 2 MG/DOSE, 8 MG/3ML SOPN Inject 2 mg into the skin once a week. 02/14/24  Yes [provider]  Testosterone 20.25 MG/ACT (1.62%) GEL testosterone 20.25 mg/1.25 gram per pump act.(1.62 %) transdermal gel 12/24/21  Yes [provider]  Tiotropium Bromide  Monohydrate (SPIRIVA  RESPIMAT) 2.5 MCG/ACT AERS Inhale 2 puffs into the lungs daily. 01/14/24  Yes Quillian Brunt, MD  PEG-3350/ELECTROLYTES/ASCORBAT 100 g SOLR Take 1 kit by mouth once. Patient not taking: Reported on 04/01/2024 12/10/23   [provider]  Semaglutide ,0.25 or 0.5MG /DOS, (OZEMPIC , 0.25 OR 0.5 MG/DOSE,) 2 MG/1.5ML SOPN Inject 0.5 mg into the skin once a week. Patient not taking: Reported on 04/02/2024    [provider]  sucralfate  (CARAFATE ) 1 g  tablet Take 1 tablet (1 g total) by mouth with breakfast, with lunch, and with evening meal. 12/02/23 01/01/24  Merdis Stalling, MD     Critical care time: n/a    Guerry Leek, MD See Tilford Foley

## 2024-04-05 DIAGNOSIS — J189 Pneumonia, unspecified organism: Secondary | ICD-10-CM | POA: Diagnosis not present

## 2024-04-05 LAB — GLUCOSE, CAPILLARY
Glucose-Capillary: 156 mg/dL — ABNORMAL HIGH (ref 70–99)
Glucose-Capillary: 244 mg/dL — ABNORMAL HIGH (ref 70–99)
Glucose-Capillary: 374 mg/dL — ABNORMAL HIGH (ref 70–99)
Glucose-Capillary: 247 mg/dL — ABNORMAL HIGH (ref 70–99)

## 2024-04-05 NOTE — Progress Notes (Signed)
 Physical Therapy Treatment Patient Details Name: Jose Patrick MRN: [id number] DOB: 26-Sep-1951 Today's Date: 04/05/2024   History of Present Illness 73 yo male admitted with Pna. 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  He increased gait to 200' and performed steps similar to home set up.  Pt did need rest breaks and using IV pole/hand rail at times for stability.  On 3 L o2 with sats 93% throughout.  Continue POC.  No PT needs at d/c.     If plan is discharge home, recommend the following: Assistance with cooking/housework;Help with stairs or ramp for entrance   Can travel by private vehicle        Equipment Recommendations  None recommended by PT    Recommendations for Other Services       Precautions / Restrictions Precautions Precautions: Fall     Mobility  Bed Mobility               General bed mobility comments: oob in recliner    Transfers Overall transfer level: Needs assistance Equipment used: None Transfers: Sit to/from Stand Sit to Stand: Supervision           General transfer comment: Performed x 2    Ambulation/Gait Ambulation/Gait assistance: Supervision Gait Distance (Feet): 200 Feet Assistive device: IV Pole, None Gait Pattern/deviations: Step-through pattern Gait velocity: decreased     General Gait Details: Using IV pole at times and handrail at times, took 3 standing rest breaks, on 3 L O2 with sats 93% throughout   Stairs Stairs: Yes Stairs assistance: Contact guard assist Stair Management: Two rails, Forwards Number of Stairs: 4 General stair comments: Performed 4 of the 4" steps with bil rails; alternating up and step to down; tolerated well   Wheelchair Mobility     Tilt Bed    Modified Rankin (Stroke Patients Only)       Balance Overall balance assessment: Needs assistance Sitting-balance support: No upper extremity  supported Sitting balance-Leahy Scale: Normal     Standing balance support: No upper extremity supported, Single extremity supported Standing balance-Leahy Scale: Fair Standing balance comment: using IV pole and hand rail at times with walking                            Communication    Cognition Arousal: Alert Behavior During Therapy: WFL for tasks assessed/performed   PT - Cognitive impairments: No apparent impairments                                Cueing    Exercises      General Comments        Pertinent Vitals/Pain Pain Assessment Pain Assessment: No/denies pain    Home Living                          Prior Function            PT Goals (current goals can now be found in the care plan section) Progress towards PT goals: Progressing toward goals    Frequency    Min 3X/week      PT Plan      Co-evaluation              AM-PAC PT "6 Clicks"  Mobility   Outcome Measure  Help needed turning from your back to your side while in a flat bed without using bedrails?: None Help needed moving from lying on your back to sitting on the side of a flat bed without using bedrails?: None Help needed moving to and from a bed to a chair (including a wheelchair)?: A Little Help needed standing up from a chair using your arms (e.g., wheelchair or bedside chair)?: A Little Help needed to walk in hospital room?: A Little Help needed climbing 3-5 steps with a railing? : A Little 6 Click Score: 20    End of Session Equipment Utilized During Treatment: Oxygen Activity Tolerance: Patient tolerated treatment well Patient left: in chair;with call bell/phone within reach Nurse Communication: Mobility status PT Visit Diagnosis: Unsteadiness on feet (R26.81)     Time: 1610-9604 PT Time Calculation (min) (ACUTE ONLY): 16 min  Charges:    $Gait Training: 8-22 mins PT General Charges $$ ACUTE PT VISIT: 1 Visit                      Cyd Dowse, PT Acute Rehab Services [facility] [phone]    Carolynn Citrin 04/05/2024, 5:38 PM

## 2024-04-05 NOTE — Plan of Care (Signed)
 Problem: Clinical Measurements: Goal: Ability to maintain clinical measurements within normal limits will improve Outcome: Progressing   Problem: Activity: Goal: Risk for activity intolerance will decrease Outcome: Progressing   Problem: Coping: Goal: Level of anxiety will decrease Outcome: Progressing   Problem: Elimination: Goal: Will not experience complications related to bowel motility Outcome: Progressing   Problem: Pain Managment: Goal: General experience of comfort will improve and/or be controlled Outcome: Progressing   Problem: Safety: Goal: Ability to remain free from injury will improve Outcome: Progressing   Ara Knee, RN 04/05/24 8:07 AM

## 2024-04-05 NOTE — Progress Notes (Signed)
 PROGRESS NOTE    Jose Patrick  ZOX:[id number] DOB: [birth year] DOA: 04/01/2024 PCP: [name]   Brief Narrative:  73 y.o. male with medical history significant for COPD, chronic hypoxic respiratory failure on 2-3 L [place], lung cancer s/p LUL lobectomy in 2013 and presumed RLL lung cancer s/p recent radiation, OSA on BiPAP, T2DM, CAD, HTN and recent recurrent pneumonia with recent treatment of persistent respiratory symptoms with Levaquin and steroids by his pulmonologist presented with worsening shortness of breath and cough.  On presentation, he was saturating in the 90s on 2 L oxygen.  COVID/influenza/RSV PCR negative.  Chest x-ray showed worsening bilateral airspace opacities most pronounced in the lower lobes. CTA PE study negative for PE but showed right lower lobe and inferior right upper lobe pneumonia.  Patient was started on IV antibiotics. Pulmonary consulted.  Pulmonary signed off on 04/04/2024 and recommended 5 days of IV Zosyn .  Assessment & Plan:   Recurrent pneumonia -Patient has had recurrent pneumonia over the last few months treated with multiple rounds of antibiotics.  Was recently on Levaquin and steroids as an outpatient by his pulmonologist. - Imaging as above.  COVID/influenza/RSV PCR negative.  Respiratory virus panel PCR negative.  Procalcitonin less than 0.1.   -Pulmonary  Pulmonary signed off on 04/04/2024 and recommended 5 days of IV Zosyn . Continue Zosyn .   COPD with acute exacerbation Chronic hypoxic respiratory failure Pulmonary fibrosis - Solu-Medrol  has been switched to oral prednisone : Continue tapering as per pulmonary.  Continue current inhaled and nebulized regimen.  Outpatient follow-up with pulmonary.  Lung cancer - Hx of lung cancer s/p LUL lobectomy in 2013 - Presumed RLL lung cancer s/p completion of radiation therapy in January 2025 - Follows with [facility] for serial imaging  Hypertension - Blood pressure mostly  stable.  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  Carb modified diet.  Metformin  on hold.  Continue gabapentin  and pyridoxine   CAD Hyperlipidemia - Stable.  Continue aspirin , Ranexa , Zetia  and statin  OSA - Unable to tolerate CPAP due to feeling of suffocation  Obesity class II - Outpatient follow-up  GERD - Continue famotidine  and PPI   DVT prophylaxis: Lovenox  Code Status: Full Family Communication: Wife on phone today. Disposition Plan: Status is: Inpatient Remains inpatient appropriate because: Of severity of illness    Consultants: Pulmonary  Procedures: None  Antimicrobials:  Anti-infectives (From admission, onward)    Start     Dose/Rate Route Frequency Ordered Stop   04/02/24 2030  cefTRIAXone  (ROCEPHIN ) 1 g in sodium chloride  0.9 % 100 mL IVPB  Status:  Discontinued        1 g 200 mL/hr over 30 Minutes Intravenous Every 24 hours 04/02/24 0326 04/02/24 0326   04/02/24 2030  cefTRIAXone  (ROCEPHIN ) 1 g in sodium chloride  0.9 % 100 mL IVPB  Status:  Discontinued        1 g 200 mL/hr over 30 Minutes Intravenous Every 24 hours 04/02/24 0326 04/02/24 0848   04/02/24 2000  azithromycin  (ZITHROMAX ) tablet 500 mg  Status:  Discontinued        500 mg Oral Daily 04/02/24 0326 04/02/24 0848   04/02/24 1400  piperacillin -tazobactam (ZOSYN ) IVPB 3.375 g        3.375 g 12.5 mL/hr over 240 Minutes Intravenous Every 8 hours 04/02/24 0857     04/02/24 0930  piperacillin -tazobactam (ZOSYN ) IVPB 3.375 g  3.375 g 100 mL/hr over 30 Minutes Intravenous  Once 04/02/24 0856 04/02/24 0946   04/01/24 2030  cefTRIAXone  (ROCEPHIN ) 2 g in sodium chloride  0.9 % 100 mL IVPB        2 g 200 mL/hr over 30 Minutes Intravenous Once 04/01/24 2019 04/01/24 2210   04/01/24 2030  azithromycin  (ZITHROMAX ) 500 mg in sodium chloride  0.9 % 250 mL IVPB         500 mg 250 mL/hr over 60 Minutes Intravenous  Once 04/01/24 2019 04/01/24 2328        Subjective: Patient seen and examined at bedside.  Cough is improving. No vomiting, fever, abdominal pain reported.  Objective: Vitals:   04/04/24 2049 04/04/24 2113 04/05/24 0500 04/05/24 0515  BP:  (!) 103/58  (!) 146/68  Pulse:  (!) 58  (!) 57  Resp:  18  18  Temp:  98.5 F (36.9 C)  (!) 97.5 F (36.4 C)  TempSrc:  Oral  Oral  SpO2: 97% 98%  97%  Weight:   108.3 kg   Height:        Intake/Output Summary (Last 24 hours) at 04/05/2024 0759 Last data filed at 04/05/2024 0600 Gross per 24 hour  Intake 974.79 ml  Output 1780 ml  Net -805.21 ml   Filed Weights   04/03/24 0507 04/04/24 0500 04/05/24 0500  Weight: 106.5 kg 107 kg 108.3 kg    Examination:  General: On 2 to 3 L oxygen via nasal cannula.  No distress currently. ENT/neck: No elevated JVD or palpable thyromegaly noted  respiratory: Decreased breath sounds at bases bilaterally with some wheezing  CVS: Mild intermittent bradycardia present; S1-S2 heard  abdominal: Soft, obese, nontender, distended slightly; no organomegaly, normal bowel sounds heard  extremities: Trace lower extremity edema present; no cyanosis CNS: Awake and alert.  No obvious focal deficits noted  lymph: No lymphadenopathy palpable Skin: No obvious ecchymosis/lesions  psych: Mostly flat affect.  Not agitated currently musculoskeletal: No obvious joint deformity/tenderness   Data Reviewed: I have personally reviewed following labs and imaging studies  CBC: Recent Labs  Lab 04/01/24 2125 04/02/24 0337  WBC 9.1 8.1  NEUTROABS 5.9  --   HGB 15.8 15.4  HCT 49.3 49.5  MCV 81.8 82.9  PLT 270 247   Basic Metabolic Panel: Recent Labs  Lab 04/01/24 2125 04/02/24 0337  NA 137 136  K 3.8 4.3  CL 104 103  CO2 25 26  GLUCOSE 176* 204*  BUN 20 20  CREATININE 1.07 1.09  CALCIUM  9.1 8.9   GFR: Estimated Creatinine Clearance: 73.1 mL/min (by  C-G formula based on SCr of 1.09 mg/dL). Liver Function Tests: Recent Labs  Lab 04/01/24 2125  AST 18  ALT 28  ALKPHOS 58  BILITOT 0.8  PROT 5.8*  ALBUMIN 2.9*   No results for input(s): "LIPASE", "AMYLASE" in the last 168 hours. No results for input(s): "AMMONIA" in the last 168 hours. Coagulation Profile: Recent Labs  Lab 04/01/24 2134  INR 1.0   Cardiac Enzymes: No results for input(s): "CKTOTAL", "CKMB", "CKMBINDEX", "TROPONINI" in the last 168 hours. BNP (last 3 results) No results for input(s): "PROBNP" in the last 8760 hours. HbA1C: Recent Labs    04/03/24 0352  HGBA1C 7.8*   CBG: Recent Labs  Lab 04/04/24 0753 04/04/24 1133 04/04/24 1657 04/04/24 2112 04/05/24 0735  GLUCAP 206* 200* 345* 288* 156*   Lipid Profile: No results for input(s): "CHOL", "HDL", "LDLCALC", "TRIG", "CHOLHDL", "LDLDIRECT" in the last 72 hours. Thyroid  Function Tests: No results for input(s): "TSH", "T4TOTAL", "FREET4", "T3FREE", "THYROIDAB" in the last 72 hours. Anemia Panel: No results for input(s): "VITAMINB12", "FOLATE", "FERRITIN", "TIBC", "IRON", "RETICCTPCT" in the last 72 hours. Sepsis Labs: Recent Labs  Lab 04/01/24 2133 04/02/24 0329  PROCALCITON  --  <0.10  LATICACIDVEN 1.3  --     Recent Results (from the past 240 hours)  Resp panel by RT-PCR (RSV, Flu A&B, Covid) Anterior Nasal Swab     Status: None   Collection Time: 04/01/24  9:13 PM   Specimen: Anterior Nasal Swab  Result Value Ref Range Status   SARS Coronavirus 2 by RT PCR NEGATIVE NEGATIVE Final    Comment: (NOTE) SARS-CoV-2 target nucleic acids are NOT DETECTED.  The SARS-CoV-2 RNA is generally detectable in upper respiratory specimens during the acute phase of infection. The lowest concentration of SARS-CoV-2 viral copies this assay can detect is 138 copies/mL. A negative result does not preclude SARS-Cov-2 infection and should not be used as the sole basis for treatment or other patient management  decisions. A negative result may occur with  improper specimen collection/handling, submission of specimen other than nasopharyngeal swab, presence of viral mutation(s) within the areas targeted by this assay, and inadequate number of viral copies(<138 copies/mL). A negative result must be combined with clinical observations, patient history, and epidemiological information. The expected result is Negative.  Fact Sheet for Patients:  BloggerCourse.com  Fact Sheet for Healthcare Providers:  SeriousBroker.it  This test is no t yet approved or cleared by the [place]  FDA and  has been authorized for detection and/or diagnosis of SARS-CoV-2 by FDA under an Emergency Use Authorization (EUA). This EUA will remain  in effect (meaning this test can be used) for the duration of the COVID-19 declaration under Section 564(b)(1) of the Act, 21 U.S.C.section 360bbb-3(b)(1), unless the authorization is terminated  or revoked sooner.       Influenza A by PCR NEGATIVE NEGATIVE Final   Influenza B by PCR NEGATIVE NEGATIVE Final    Comment: (NOTE) The Xpert Xpress SARS-CoV-2/FLU/RSV plus assay is intended as an aid in the diagnosis of influenza from Nasopharyngeal swab specimens and should not be used as a sole basis for treatment. Nasal washings and aspirates are unacceptable for Xpert Xpress SARS-CoV-2/FLU/RSV testing.  Fact Sheet for Patients: BloggerCourse.com  Fact Sheet for Healthcare Providers: SeriousBroker.it  This test is not yet approved or cleared by the [place]  FDA and has been authorized for detection and/or diagnosis of SARS-CoV-2 by FDA under an Emergency Use Authorization (EUA). This EUA will remain in effect (meaning this test can be used) for the duration of the COVID-19 declaration under Section 564(b)(1) of the Act, 21 U.S.C. section 360bbb-3(b)(1), unless the  authorization is terminated or revoked.     Resp Syncytial Virus by PCR NEGATIVE NEGATIVE Final    Comment: (NOTE) Fact Sheet for Patients: BloggerCourse.com  Fact Sheet for Healthcare Providers: SeriousBroker.it  This test is not yet approved or cleared by the [place]  FDA and has been authorized for detection and/or diagnosis of SARS-CoV-2 by FDA under an Emergency Use Authorization (EUA). This EUA will remain in effect (meaning this test can be used) for the duration of the COVID-19 declaration under Section 564(b)(1) of the Act, 21 U.S.C. section 360bbb-3(b)(1), unless the authorization is terminated or revoked.  Performed at [facility], [address], [place], [place] [address]   Respiratory (~20 pathogens) panel by PCR     Status: None  Collection Time: 04/01/24  9:13 PM   Specimen: Nasopharyngeal Swab; Respiratory  Result Value Ref Range Status   Adenovirus NOT DETECTED NOT DETECTED Final   Coronavirus 229E NOT DETECTED NOT DETECTED Final    Comment: (NOTE) The Coronavirus on the Respiratory Panel, DOES NOT test for the novel  Coronavirus (2019 nCoV)    Coronavirus HKU1 NOT DETECTED NOT DETECTED Final   Coronavirus NL63 NOT DETECTED NOT DETECTED Final   Coronavirus OC43 NOT DETECTED NOT DETECTED Final   Metapneumovirus NOT DETECTED NOT DETECTED Final   Rhinovirus / Enterovirus NOT DETECTED NOT DETECTED Final   Influenza A NOT DETECTED NOT DETECTED Final   Influenza B NOT DETECTED NOT DETECTED Final   Parainfluenza Virus 1 NOT DETECTED NOT DETECTED Final   Parainfluenza Virus 2 NOT DETECTED NOT DETECTED Final   Parainfluenza Virus 3 NOT DETECTED NOT DETECTED Final   Parainfluenza Virus 4 NOT DETECTED NOT DETECTED Final   Respiratory Syncytial Virus NOT DETECTED NOT DETECTED Final   Bordetella pertussis NOT DETECTED NOT DETECTED Final   Bordetella Parapertussis NOT DETECTED NOT DETECTED  Final   Chlamydophila pneumoniae NOT DETECTED NOT DETECTED Final   Mycoplasma pneumoniae NOT DETECTED NOT DETECTED Final    Comment: Performed at [facility], [address], [place], [place] [address]  Blood Culture (routine x 2)     Status: None (Preliminary result)   Collection Time: 04/01/24  9:25 PM   Specimen: BLOOD  Result Value Ref Range Status   Specimen Description   Final    BLOOD RIGHT ANTECUBITAL Performed at [facility], [address], [place], [place] [address]    Special Requests   Final    BOTTLES DRAWN AEROBIC AND ANAEROBIC Blood Culture adequate volume Performed at [facility], [address], [place], [place] [address]    Culture   Final    NO GROWTH 3 DAYS Performed at [facility], [address], [place], [place] [address]    Report Status PENDING  Incomplete  Blood Culture (routine x 2)     Status: None (Preliminary result)   Collection Time: 04/01/24  9:58 PM   Specimen: BLOOD RIGHT HAND  Result Value Ref Range Status   Specimen Description   Final    BLOOD RIGHT HAND Performed at [facility], [address] [place], [place] [address]    Special Requests   Final    BOTTLES DRAWN AEROBIC AND ANAEROBIC Blood Culture results may not be optimal due to an inadequate volume of blood received in culture bottles Performed at [facility], [address] [place], [place] [address]    Culture   Final    NO GROWTH 3 DAYS Performed at [facility], [address] [place], [place] [address]    Report Status PENDING  Incomplete  Expectorated Sputum Assessment w Gram Stain, Rflx to Resp Cult     Status: None   Collection Time: 04/01/24 11:39 PM  Result Value Ref Range Status   Specimen Description EXPECTORATED SPUTUM  Final   Special Requests NONE  Final   Sputum evaluation   Final    Sputum specimen not acceptable for testing.  Please recollect.   Baldomero Bone RN @ [phone]  04/03/24 CAL Performed at [facility] [place] [facility], [address], [place], [place] [address]    Report Status 04/03/2024 FINAL  Final  MRSA Next Gen by PCR, Nasal     Status:  None   Collection Time: 04/02/24  6:10 AM   Specimen: Nasopharyngeal Swab; Nasal Swab  Result Value Ref Range Status   MRSA by PCR Next Gen NOT DETECTED NOT DETECTED Final    Comment: (NOTE) The GeneXpert MRSA Assay (FDA approved for NASAL specimens only), is one component of a comprehensive MRSA colonization surveillance program. It is not intended to diagnose MRSA infection nor to guide or monitor treatment for MRSA infections. Test performance is not FDA approved in patients less than 42 years old. Performed at [facility], [address], [place], [place] [address]   Expectorated Sputum Assessment w Gram Stain, Rflx to Resp Cult     Status: None   Collection Time: 04/02/24 10:00 PM   Specimen: Sputum  Result Value Ref Range Status   Specimen Description SPUTUM  Final   Special Requests NONE  Final   Sputum evaluation   Final    Sputum specimen not acceptable for testing.  Please recollect.   Performed at [facility], [address] [place], [place] [address]    Report Status 04/02/2024 FINAL  Final         Radiology Studies: No results found.       Scheduled Meds:  amLODipine   10 mg Oral QPM   arformoterol   15 mcg Nebulization BID   aspirin   81 mg Oral Daily   atorvastatin   40 mg Oral Daily   budesonide  (PULMICORT ) nebulizer solution  0.25 mg Nebulization BID   cholecalciferol   2,000 Units Oral Daily   dextromethorphan -guaiFENesin   2 tablet Oral BID   empagliflozin   10 mg Oral Daily   enoxaparin  (LOVENOX ) injection  40 mg Subcutaneous Q24H   ezetimibe   10 mg Oral Daily   famotidine   20 mg Oral QHS   feeding supplement  237 mL Oral BID BM   ferrous sulfate   325 mg Oral BID WC   fluticasone   2 spray Each Nare Daily   gabapentin   300 mg  Oral BID   insulin  aspart  0-15 Units Subcutaneous TID WC   insulin  aspart  0-5 Units Subcutaneous QHS   insulin  glargine-yfgn  20 Units Subcutaneous QHS   losartan   25 mg Oral Daily   metoprolol  succinate  100 mg Oral Daily   pantoprazole   40 mg Oral Daily   predniSONE   40 mg Oral Q breakfast   Followed by   Cecily Cohen ON 04/09/2024] predniSONE   20 mg Oral Q breakfast   pyridOXINE   25 mg Oral Daily   ranolazine   500 mg Oral BID   revefenacin   175 mcg Nebulization Daily   Continuous Infusions:  piperacillin -tazobactam (ZOSYN )  IV 3.375 g (04/05/24 0514)          Audria Leather, MD [facility] 04/05/2024, 7:59 AM

## 2024-04-05 NOTE — Progress Notes (Signed)
 Mobility Specialist - Progress Note  ([place] 3L) Pre-mobility: 67 bpm HR, 94% SpO2 During mobility: 90% SpO2 Post-mobility: 65 bpm HR, 94% SPO2   04/05/24 1133  Mobility  Activity Ambulated with assistance in hallway  Level of Assistance Contact guard assist, steadying assist  Assistive Device Other (Comment) (IV Pole)  Distance Ambulated (ft) 125 ft  Range of Motion/Exercises Active  Activity Response Tolerated fair  Mobility Referral Yes  Mobility visit 1 Mobility  Mobility Specialist Start Time (ACUTE ONLY) 1120  Mobility Specialist Stop Time (ACUTE ONLY) 1133  Mobility Specialist Time Calculation (min) (ACUTE ONLY) 13 min   Pt was found in bed and agreeable to ambulate. Non-productive coughing throughout session. Returned to bed with all needs met. Call bell in reach and NT in room.  Lorna Rose,  Mobility Specialist Can be reached via Secure Chat

## 2024-04-05 NOTE — Plan of Care (Signed)

## 2024-04-06 DIAGNOSIS — J189 Pneumonia, unspecified organism: Secondary | ICD-10-CM | POA: Diagnosis not present

## 2024-04-06 LAB — GLUCOSE, CAPILLARY
Glucose-Capillary: 126 mg/dL — ABNORMAL HIGH (ref 70–99)
Glucose-Capillary: 236 mg/dL — ABNORMAL HIGH (ref 70–99)
Glucose-Capillary: 475 mg/dL — ABNORMAL HIGH (ref 70–99)
Glucose-Capillary: 246 mg/dL — ABNORMAL HIGH (ref 70–99)

## 2024-04-06 MED ORDER — INSULIN ASPART 100 UNIT/ML IJ SOLN
0.0000 [IU] | Freq: Three times a day (TID) | INTRAMUSCULAR | Status: DC
  Administered 2024-04-07: 7 [IU] via SUBCUTANEOUS

## 2024-04-06 MED ORDER — INSULIN ASPART 100 UNIT/ML IJ SOLN
25.0000 [IU] | Freq: Once | INTRAMUSCULAR | Status: AC
  Administered 2024-04-06: 25 [IU] via SUBCUTANEOUS

## 2024-04-06 MED ORDER — INSULIN GLARGINE-YFGN 100 UNIT/ML ~~LOC~~ SOLN
25.0000 [IU] | Freq: Every day | SUBCUTANEOUS | Status: DC
  Administered 2024-04-06: 25 [IU] via SUBCUTANEOUS
  Filled 2024-04-06 (×2): qty 0.25

## 2024-04-06 NOTE — Plan of Care (Signed)
  Problem: Health Behavior/Discharge Planning: Goal: Ability to manage health-related needs will improve Outcome: Progressing   Problem: Clinical Measurements: Goal: Ability to maintain clinical measurements within normal limits will improve Outcome: Progressing Goal: Will remain free from infection Outcome: Progressing Goal: Diagnostic test results will improve Outcome: Progressing Goal: Respiratory complications will improve Outcome: Progressing Goal: Cardiovascular complication will be avoided Outcome: Progressing   Problem: Activity: Goal: Risk for activity intolerance will decrease Outcome: Progressing   Problem: Elimination: Goal: Will not experience complications related to bowel motility Outcome: Progressing   Problem: Pain Managment: Goal: General experience of comfort will improve and/or be controlled Outcome: Progressing   Problem: Safety: Goal: Ability to remain free from injury will improve Outcome: Progressing   Problem: Activity: Goal: Ability to tolerate increased activity will improve Outcome: Progressing   Problem: Clinical Measurements: Goal: Ability to maintain a body temperature in the normal range will improve Outcome: Progressing   Problem: Respiratory: Goal: Ability to maintain adequate ventilation will improve Outcome: Progressing Goal: Ability to maintain a clear airway will improve Outcome: Progressing   Problem: Education: Goal: Ability to describe self-care measures that may prevent or decrease complications (Diabetes Survival Skills Education) will improve Outcome: Progressing   Problem: Coping: Goal: Ability to adjust to condition or change in health will improve Outcome: Progressing   Problem: Fluid Volume: Goal: Ability to maintain a balanced intake and output will improve Outcome: Progressing   Problem: Health Behavior/Discharge Planning: Goal: Ability to identify and utilize available resources and services will  improve Outcome: Progressing Goal: Ability to manage health-related needs will improve Outcome: Progressing   Problem: Metabolic: Goal: Ability to maintain appropriate glucose levels will improve Outcome: Progressing   Problem: Nutritional: Goal: Maintenance of adequate nutrition will improve Outcome: Progressing Goal: Progress toward achieving an optimal weight will improve Outcome: Progressing   Problem: Skin Integrity: Goal: Risk for impaired skin integrity will decrease Outcome: Progressing   Problem: Tissue Perfusion: Goal: Adequacy of tissue perfusion will improve Outcome: Progressing

## 2024-04-06 NOTE — Progress Notes (Signed)
 PROGRESS NOTE    Jose Patrick  ZOX:[id number] DOB: 06-29-1951 DOA: 04/01/2024 PCP: [name]   Brief Narrative:  73 y.o. male with medical history significant for COPD, chronic hypoxic respiratory failure on 2-3 L [place], lung cancer s/p LUL lobectomy in 2013 and presumed RLL lung cancer s/p recent radiation, OSA on BiPAP, T2DM, CAD, HTN and recent recurrent pneumonia with recent treatment of persistent respiratory symptoms with Levaquin and steroids by his pulmonologist presented with worsening shortness of breath and cough.  On presentation, he was saturating in the 90s on 2 L oxygen.  COVID/influenza/RSV PCR negative.  Chest x-ray showed worsening bilateral airspace opacities most pronounced in the lower lobes. CTA PE study negative for PE but showed right lower lobe and inferior right upper lobe pneumonia.  Patient was started on IV antibiotics. Pulmonary consulted.  Pulmonary signed off on 04/04/2024 and recommended 5 days of IV Zosyn .  Assessment & Plan:   Recurrent pneumonia -Patient has had recurrent pneumonia over the last few months treated with multiple rounds of antibiotics.  Was recently on Levaquin and steroids as an outpatient by his pulmonologist. - Imaging as above.  COVID/influenza/RSV PCR negative.  Respiratory virus panel PCR negative.  Procalcitonin less than 0.1.   -Pulmonary off on 04/04/2024 and recommended 5 days of IV Zosyn . Continue Zosyn .   COPD with acute exacerbation Chronic hypoxic respiratory failure Pulmonary fibrosis - Solu-Medrol  has been switched to oral prednisone : Continue tapering as per pulmonary.  Continue current inhaled and nebulized regimen.  Outpatient follow-up with pulmonary.  Lung cancer - Hx of lung cancer s/p LUL lobectomy in 2013 - Presumed RLL lung cancer s/p completion of radiation therapy in January 2025 - Follows with [facility] for serial imaging  Hypertension - Blood pressure mostly stable.  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  Carb modified diet.  Metformin  on hold.  Continue gabapentin  and pyridoxine   CAD Hyperlipidemia - Stable.  Continue aspirin , Ranexa , Zetia  and statin  OSA - Unable to tolerate CPAP due to feeling of suffocation  Obesity class II - Outpatient follow-up  GERD - Continue famotidine  and PPI   DVT prophylaxis: Lovenox  Code Status: Full Family Communication: Wife on phone  Disposition Plan: Status is: Inpatient Remains inpatient appropriate because: Of severity of illness.  Possible discharge tomorrow if remains stable    Consultants: Pulmonary  Procedures: None  Antimicrobials:  Anti-infectives (From admission, onward)    Start     Dose/Rate Route Frequency Ordered Stop   04/02/24 2030  cefTRIAXone  (ROCEPHIN ) 1 g in sodium chloride  0.9 % 100 mL IVPB  Status:  Discontinued        1 g 200 mL/hr over 30 Minutes Intravenous Every 24 hours 04/02/24 0326 04/02/24 0326   04/02/24 2030  cefTRIAXone  (ROCEPHIN ) 1 g in sodium chloride  0.9 % 100 mL IVPB  Status:  Discontinued        1 g 200 mL/hr over 30 Minutes Intravenous Every 24 hours 04/02/24 0326 04/02/24 0848   04/02/24 2000  azithromycin  (ZITHROMAX ) tablet 500 mg  Status:  Discontinued        500 mg Oral Daily 04/02/24 0326 04/02/24 0848   04/02/24 1400  piperacillin -tazobactam (ZOSYN ) IVPB 3.375 g        3.375 g 12.5 mL/hr over 240 Minutes Intravenous Every 8 hours 04/02/24 0857 04/06/24 2359   04/02/24 0930  piperacillin -tazobactam (ZOSYN )  IVPB 3.375 g        3.375 g 100 mL/hr over 30 Minutes Intravenous  Once 04/02/24 0856 04/02/24 0946   04/01/24 2030  cefTRIAXone  (ROCEPHIN ) 2 g in sodium chloride  0.9 % 100 mL IVPB        2 g 200 mL/hr over 30 Minutes Intravenous Once 04/01/24 2019 04/01/24 2210   04/01/24 2030  azithromycin  (ZITHROMAX ) 500 mg in  sodium chloride  0.9 % 250 mL IVPB        500 mg 250 mL/hr over 60 Minutes Intravenous  Once 04/01/24 2019 04/01/24 2328        Subjective: Patient seen and examined at bedside.  Denies worsening shortness breath, fever or vomiting. Objective: Vitals:   04/05/24 2042 04/05/24 2046 04/05/24 2104 04/06/24 0520  BP:   124/86 115/78  Pulse:   69 64  Resp:   18 18  Temp:   97.8 F (36.6 C) (!) 97.5 F (36.4 C)  TempSrc:   Oral Oral  SpO2: 97% 97% 92% 90%  Weight:      Height:        Intake/Output Summary (Last 24 hours) at 04/06/2024 0812 Last data filed at 04/06/2024 0600 Gross per 24 hour  Intake 601.98 ml  Output 750 ml  Net -148.02 ml   Filed Weights   04/03/24 0507 04/04/24 0500 04/05/24 0500  Weight: 106.5 kg 107 kg 108.3 kg    Examination:  General: No acute distress.  Remains on 3 L oxygen by nasal cannula. ENT/neck: No palpable neck masses or JVD elevation noted  respiratory: Bilateral decreased breath sounds at bases with scattered crackles  CVS: Rate currently controlled; S1 and S2 are heard abdominal: Soft, obese, nontender, still slightly distended; no organomegaly, bowel are normally heard  extremities: No clubbing; mild lower extremity edema present  CNS: Alert and orient.  No focal deficits noted lymph: No cervical lymphadenopathy palpable Skin: No obvious rashes/petechiae  psych: Showing no signs of agitation.  Flat affect currently.   Musculoskeletal: No obvious joint erythema or tenderness  Data Reviewed: I have personally reviewed following labs and imaging studies  CBC: Recent Labs  Lab 04/01/24 2125 04/02/24 0337  WBC 9.1 8.1  NEUTROABS 5.9  --   HGB 15.8 15.4  HCT 49.3 49.5  MCV 81.8 82.9  PLT 270 247   Basic Metabolic Panel: Recent Labs  Lab 04/01/24 2125 04/02/24 0337  NA 137 136  K 3.8 4.3  CL 104 103  CO2 25 26  GLUCOSE 176* 204*  BUN 20 20  CREATININE 1.07 1.09  CALCIUM  9.1 8.9   GFR: Estimated Creatinine Clearance:  73.1 mL/min (by C-G formula based on SCr of 1.09 mg/dL). Liver Function Tests: Recent Labs  Lab 04/01/24 2125  AST 18  ALT 28  ALKPHOS 58  BILITOT 0.8  PROT 5.8*  ALBUMIN 2.9*   No results for input(s): "LIPASE", "AMYLASE" in the last 168 hours. No results for input(s): "AMMONIA" in the last 168 hours. Coagulation Profile: Recent Labs  Lab 04/01/24 2134  INR 1.0   Cardiac Enzymes: No results for input(s): "CKTOTAL", "CKMB", "CKMBINDEX", "TROPONINI" in the last 168 hours. BNP (last 3 results) No results for input(s): "PROBNP" in the last 8760 hours. HbA1C: No results for input(s): "HGBA1C" in the last 72 hours.  CBG: Recent Labs  Lab 04/05/24 0735 04/05/24 1129 04/05/24 1631 04/05/24 2104 04/06/24 0722  GLUCAP 156* 244* 374* 247* 126*   Lipid Profile: No results for input(s): "CHOL", "HDL", "LDLCALC", "TRIG", "  CHOLHDL", "LDLDIRECT" in the last 72 hours. Thyroid Function Tests: No results for input(s): "TSH", "T4TOTAL", "FREET4", "T3FREE", "THYROIDAB" in the last 72 hours. Anemia Panel: No results for input(s): "VITAMINB12", "FOLATE", "FERRITIN", "TIBC", "IRON", "RETICCTPCT" in the last 72 hours. Sepsis Labs: Recent Labs  Lab 04/01/24 2133 04/02/24 0329  PROCALCITON  --  <0.10  LATICACIDVEN 1.3  --     Recent Results (from the past 240 hours)  Resp panel by RT-PCR (RSV, Flu A&B, Covid) Anterior Nasal Swab     Status: None   Collection Time: 04/01/24  9:13 PM   Specimen: Anterior Nasal Swab  Result Value Ref Range Status   SARS Coronavirus 2 by RT PCR NEGATIVE NEGATIVE Final    Comment: (NOTE) SARS-CoV-2 target nucleic acids are NOT DETECTED.  The SARS-CoV-2 RNA is generally detectable in upper respiratory specimens during the acute phase of infection. The lowest concentration of SARS-CoV-2 viral copies this assay can detect is 138 copies/mL. A negative result does not preclude SARS-Cov-2 infection and should not be used as the sole basis for treatment  or other patient management decisions. A negative result may occur with  improper specimen collection/handling, submission of specimen other than nasopharyngeal swab, presence of viral mutation(s) within the areas targeted by this assay, and inadequate number of viral copies(<138 copies/mL). A negative result must be combined with clinical observations, patient history, and epidemiological information. The expected result is Negative.  Fact Sheet for Patients:  BloggerCourse.com  Fact Sheet for Healthcare Providers:  SeriousBroker.it  This test is no t yet approved or cleared by the [place]  FDA and  has been authorized for detection and/or diagnosis of SARS-CoV-2 by FDA under an Emergency Use Authorization (EUA). This EUA will remain  in effect (meaning this test can be used) for the duration of the COVID-19 declaration under Section 564(b)(1) of the Act, 21 U.S.C.section 360bbb-3(b)(1), unless the authorization is terminated  or revoked sooner.       Influenza A by PCR NEGATIVE NEGATIVE Final   Influenza B by PCR NEGATIVE NEGATIVE Final    Comment: (NOTE) The Xpert Xpress SARS-CoV-2/FLU/RSV plus assay is intended as an aid in the diagnosis of influenza from Nasopharyngeal swab specimens and should not be used as a sole basis for treatment. Nasal washings and aspirates are unacceptable for Xpert Xpress SARS-CoV-2/FLU/RSV testing.  Fact Sheet for Patients: BloggerCourse.com  Fact Sheet for Healthcare Providers: SeriousBroker.it  This test is not yet approved or cleared by the [place]  FDA and has been authorized for detection and/or diagnosis of SARS-CoV-2 by FDA under an Emergency Use Authorization (EUA). This EUA will remain in effect (meaning this test can be used) for the duration of the COVID-19 declaration under Section 564(b)(1) of the Act, 21 U.S.C. section  360bbb-3(b)(1), unless the authorization is terminated or revoked.     Resp Syncytial Virus by PCR NEGATIVE NEGATIVE Final    Comment: (NOTE) Fact Sheet for Patients: BloggerCourse.com  Fact Sheet for Healthcare Providers: SeriousBroker.it  This test is not yet approved or cleared by the [place]  FDA and has been authorized for detection and/or diagnosis of SARS-CoV-2 by FDA under an Emergency Use Authorization (EUA). This EUA will remain in effect (meaning this test can be used) for the duration of the COVID-19 declaration under Section 564(b)(1) of the Act, 21 U.S.C. section 360bbb-3(b)(1), unless the authorization is terminated or revoked.  Performed at [facility], [address], [place], [place] [address]   Respiratory (~20 pathogens) panel by  PCR     Status: None   Collection Time: 04/01/24  9:13 PM   Specimen: Nasopharyngeal Swab; Respiratory  Result Value Ref Range Status   Adenovirus NOT DETECTED NOT DETECTED Final   Coronavirus 229E NOT DETECTED NOT DETECTED Final    Comment: (NOTE) The Coronavirus on the Respiratory Panel, DOES NOT test for the novel  Coronavirus (2019 nCoV)    Coronavirus HKU1 NOT DETECTED NOT DETECTED Final   Coronavirus NL63 NOT DETECTED NOT DETECTED Final   Coronavirus OC43 NOT DETECTED NOT DETECTED Final   Metapneumovirus NOT DETECTED NOT DETECTED Final   Rhinovirus / Enterovirus NOT DETECTED NOT DETECTED Final   Influenza A NOT DETECTED NOT DETECTED Final   Influenza B NOT DETECTED NOT DETECTED Final   Parainfluenza Virus 1 NOT DETECTED NOT DETECTED Final   Parainfluenza Virus 2 NOT DETECTED NOT DETECTED Final   Parainfluenza Virus 3 NOT DETECTED NOT DETECTED Final   Parainfluenza Virus 4 NOT DETECTED NOT DETECTED Final   Respiratory Syncytial Virus NOT DETECTED NOT DETECTED Final   Bordetella pertussis NOT DETECTED NOT DETECTED Final   Bordetella Parapertussis  NOT DETECTED NOT DETECTED Final   Chlamydophila pneumoniae NOT DETECTED NOT DETECTED Final   Mycoplasma pneumoniae NOT DETECTED NOT DETECTED Final    Comment: Performed at [facility], [address], [place], [place] [address]  Blood Culture (routine x 2)     Status: None (Preliminary result)   Collection Time: 04/01/24  9:25 PM   Specimen: BLOOD  Result Value Ref Range Status   Specimen Description   Final    BLOOD RIGHT ANTECUBITAL Performed at [facility], [address], [place], [place] [address]    Special Requests   Final    BOTTLES DRAWN AEROBIC AND ANAEROBIC Blood Culture adequate volume Performed at [facility], [address] [place], [place] [address]    Culture   Final    NO GROWTH 4 DAYS Performed at [facility], [address] [place], [place] [address]    Report Status PENDING  Incomplete  Blood Culture (routine x 2)     Status: None (Preliminary result)   Collection Time: 04/01/24  9:58 PM   Specimen: BLOOD RIGHT HAND  Result Value Ref Range Status   Specimen Description   Final    BLOOD RIGHT HAND Performed at [facility], [address], [place], [place] [address]    Special Requests   Final    BOTTLES DRAWN AEROBIC AND ANAEROBIC Blood Culture results may not be optimal due to an inadequate volume of blood received in culture bottles Performed at [facility], [address], [place], [place] [address]    Culture   Final    NO GROWTH 4 DAYS Performed at [facility], [address], [place], [place] [address]    Report Status PENDING  Incomplete  Expectorated Sputum Assessment w Gram Stain, Rflx to Resp Cult     Status: None   Collection Time: 04/01/24 11:39 PM  Result Value Ref Range Status   Specimen Description EXPECTORATED SPUTUM  Final   Special Requests NONE  Final   Sputum evaluation   Final    Sputum specimen not acceptable for testing.  Please recollect.    Baldomero Bone RN @ [phone] 04/03/24 CAL Performed at [facility], [address] [place], [place] [address]    Report Status 04/03/2024 FINAL  Final  MRSA Next  Gen by PCR, Nasal     Status: None   Collection Time: 04/02/24  6:10 AM   Specimen: Nasopharyngeal Swab; Nasal Swab  Result Value Ref Range Status   MRSA by PCR Next Gen NOT DETECTED NOT DETECTED Final    Comment: (NOTE) The GeneXpert MRSA Assay (FDA approved for NASAL specimens only), is one component of a comprehensive MRSA colonization surveillance program. It is not intended to diagnose MRSA infection nor to guide or monitor treatment for MRSA infections. Test performance is not FDA approved in patients less than 36 years old. Performed at [facility], [address], [place], [place] [address]   Expectorated Sputum Assessment w Gram Stain, Rflx to Resp Cult     Status: None   Collection Time: 04/02/24 10:00 PM   Specimen: Sputum  Result Value Ref Range Status   Specimen Description SPUTUM  Final   Special Requests NONE  Final   Sputum evaluation   Final    Sputum specimen not acceptable for testing.  Please recollect.   Performed at [facility], [address] [place], [place] [address]    Report Status 04/02/2024 FINAL  Final         Radiology Studies: No results found.       Scheduled Meds:  amLODipine   10 mg Oral QPM   arformoterol   15 mcg Nebulization BID   aspirin   81 mg Oral Daily   atorvastatin   40 mg Oral Daily   budesonide  (PULMICORT ) nebulizer solution  0.25 mg Nebulization BID   cholecalciferol   2,000 Units Oral Daily   dextromethorphan -guaiFENesin   2 tablet Oral BID   empagliflozin   10 mg Oral Daily   enoxaparin  (LOVENOX ) injection  40 mg Subcutaneous Q24H   ezetimibe   10 mg Oral Daily   famotidine   20 mg Oral QHS   feeding supplement  237 mL Oral BID BM   ferrous sulfate   325 mg Oral BID WC   fluticasone   2 spray Each Nare Daily    gabapentin   300 mg Oral BID   insulin  aspart  0-15 Units Subcutaneous TID WC   insulin  aspart  0-5 Units Subcutaneous QHS   insulin  glargine-yfgn  20 Units Subcutaneous QHS   losartan   25 mg Oral Daily   metoprolol  succinate  100 mg Oral Daily   pantoprazole   40 mg Oral Daily   predniSONE   40 mg Oral Q breakfast   Followed by   Cecily Cohen ON 04/09/2024] predniSONE   20 mg Oral Q breakfast   pyridOXINE   25 mg Oral Daily   ranolazine   500 mg Oral BID   revefenacin   175 mcg Nebulization Daily   Continuous Infusions:  piperacillin -tazobactam (ZOSYN )  IV 3.375 g (04/06/24 0504)          Audria Leather, MD [facility] 04/06/2024, 8:12 AM

## 2024-04-06 NOTE — Progress Notes (Signed)
 Mobility Specialist - Progress Note   04/06/24 1048  Oxygen Therapy  SpO2 (!) 81 %  O2 Device Nasal Cannula  O2 Flow Rate (L/min) 3 L/min  Patient Activity (if Appropriate) Ambulating  Mobility  Activity Ambulated independently in hallway  Level of Assistance Independent  Assistive Device None  Distance Ambulated (ft) 160 ft  Activity Response Tolerated well  Mobility Referral Yes  Mobility visit 1 Mobility  Mobility Specialist Start Time (ACUTE ONLY) 1022  Mobility Specialist Stop Time (ACUTE ONLY) 1037  Mobility Specialist Time Calculation (min) (ACUTE ONLY) 15 min   Pt received in recliner and agreeable to mobility. During ambulation, pt desat to 81%. Encouraged pursed lip breaths bringing SpO2 to 91%. No complaints during session. Pt to recliner after session with all needs met.    Pre-mobility: 93% SpO2 (3L [place]) During mobility: 81-91% SpO2 (3L [place]) Post-mobility: 95% SPO2 (3L [place])  Maya Teresita Fanton Mobility Specialist

## 2024-04-07 ENCOUNTER — Other Ambulatory Visit (HOSPITAL_COMMUNITY): Payer: Self-pay

## 2024-04-07 DIAGNOSIS — J9611 Chronic respiratory failure with hypoxia: Secondary | ICD-10-CM | POA: Diagnosis not present

## 2024-04-07 DIAGNOSIS — J441 Chronic obstructive pulmonary disease with (acute) exacerbation: Secondary | ICD-10-CM | POA: Diagnosis not present

## 2024-04-07 DIAGNOSIS — K219 Gastro-esophageal reflux disease without esophagitis: Secondary | ICD-10-CM | POA: Diagnosis not present

## 2024-04-07 DIAGNOSIS — Z794 Long term (current) use of insulin: Secondary | ICD-10-CM

## 2024-04-07 DIAGNOSIS — I10 Essential (primary) hypertension: Secondary | ICD-10-CM

## 2024-04-07 DIAGNOSIS — J9610 Chronic respiratory failure, unspecified whether with hypoxia or hypercapnia: Secondary | ICD-10-CM

## 2024-04-07 DIAGNOSIS — J189 Pneumonia, unspecified organism: Secondary | ICD-10-CM | POA: Diagnosis not present

## 2024-04-07 DIAGNOSIS — E785 Hyperlipidemia, unspecified: Secondary | ICD-10-CM

## 2024-04-07 DIAGNOSIS — E1140 Type 2 diabetes mellitus with diabetic neuropathy, unspecified: Secondary | ICD-10-CM

## 2024-04-07 DIAGNOSIS — G4733 Obstructive sleep apnea (adult) (pediatric): Secondary | ICD-10-CM

## 2024-04-07 LAB — BASIC METABOLIC PANEL WITH GFR
Anion gap: 9 (ref 5–15)
BUN: 28 mg/dL — ABNORMAL HIGH (ref 8–23)
CO2: 29 mmol/L (ref 22–32)
Calcium: 9.8 mg/dL (ref 8.9–10.3)
Chloride: 96 mmol/L — ABNORMAL LOW (ref 98–111)
Creatinine, Ser: 1.09 mg/dL (ref 0.61–1.24)
GFR, Estimated: >60 mL/min (ref >60)
Glucose, Bld: 162 mg/dL — ABNORMAL HIGH (ref 70–99)
Potassium: 4.4 mmol/L (ref 3.5–5.1)
Sodium: 134 mmol/L — ABNORMAL LOW (ref 135–145)

## 2024-04-07 LAB — CULTURE, BLOOD (ROUTINE X 2)
Culture: NO GROWTH
Culture: NO GROWTH
Special Requests: ADEQUATE

## 2024-04-07 LAB — CBC WITH DIFFERENTIAL/PLATELET
Abs Immature Granulocytes: 0.07 10*3/uL (ref 0.00–0.07)
Basophils Absolute: 0 10*3/uL (ref 0.0–0.1)
Basophils Relative: 0 %
Eosinophils Absolute: 0.2 10*3/uL (ref 0.0–0.5)
Eosinophils Relative: 1 %
HCT: 52.5 % — ABNORMAL HIGH (ref 39.0–52.0)
Hemoglobin: 16.3 g/dL (ref 13.0–17.0)
Immature Granulocytes: 1 %
Lymphocytes Relative: 16 %
Lymphs Abs: 2 10*3/uL (ref 0.7–4.0)
MCH: 26.2 pg (ref 26.0–34.0)
MCHC: 31 g/dL (ref 30.0–36.0)
MCV: 84.4 fL (ref 80.0–100.0)
Monocytes Absolute: 0.8 10*3/uL (ref 0.1–1.0)
Monocytes Relative: 6 %
Neutro Abs: 9.4 10*3/uL — ABNORMAL HIGH (ref 1.7–7.7)
Neutrophils Relative %: 76 %
Platelets: 294 10*3/uL (ref 150–400)
RBC: 6.22 MIL/uL — ABNORMAL HIGH (ref 4.22–5.81)
RDW: 15.6 % — ABNORMAL HIGH (ref 11.5–15.5)
WBC: 12.5 10*3/uL — ABNORMAL HIGH (ref 4.0–10.5)
nRBC: 0 % (ref 0.0–0.2)

## 2024-04-07 LAB — GLUCOSE, CAPILLARY
Glucose-Capillary: 112 mg/dL — ABNORMAL HIGH (ref 70–99)
Glucose-Capillary: 208 mg/dL — ABNORMAL HIGH (ref 70–99)

## 2024-04-07 LAB — MAGNESIUM: Magnesium: 2.1 mg/dL (ref 1.7–2.4)

## 2024-04-07 MED ORDER — ENSURE ENLIVE PO LIQD: 237.0000 mL | Freq: Two times a day (BID) | ORAL | Status: DC

## 2024-04-07 MED ORDER — FAMOTIDINE 20 MG PO TABS
20.0000 mg | ORAL_TABLET | Freq: Every day | ORAL | 0 refills | Status: DC
  Filled 2024-04-07: qty 90, 90d supply, fill #0

## 2024-04-07 MED ORDER — IPRATROPIUM-ALBUTEROL 0.5-2.5 (3) MG/3ML IN SOLN
RESPIRATORY_TRACT | 0 refills | Status: DC
  Filled 2024-04-07: qty 180, 14d supply, fill #0

## 2024-04-07 MED ORDER — GUAIFENESIN ER 600 MG PO TB12: 600.0000 mg | ORAL_TABLET | Freq: Two times a day (BID) | ORAL | 0 refills | Status: AC

## 2024-04-07 MED ORDER — PREDNISONE 20 MG PO TABS: ORAL_TABLET | ORAL | 0 refills | Status: AC

## 2024-04-07 MED ORDER — OMEPRAZOLE 40 MG PO CPDR
40.0000 mg | DELAYED_RELEASE_CAPSULE | Freq: Every day | ORAL | 0 refills | Status: DC
  Filled 2024-04-07: qty 90, 90d supply, fill #0

## 2024-04-07 MED ORDER — INSULIN GLARGINE 100 UNIT/ML ~~LOC~~ SOLN: 25.0000 [IU] | Freq: Every day | SUBCUTANEOUS | Status: DC

## 2024-04-07 MED ORDER — BENZONATATE 200 MG PO CAPS
200.0000 mg | ORAL_CAPSULE | Freq: Three times a day (TID) | ORAL | 1 refills | Status: DC | PRN
  Filled 2024-04-07: qty 20, 7d supply, fill #0

## 2024-04-07 MED ORDER — GUAIFENESIN ER 600 MG PO TB12
600.0000 mg | ORAL_TABLET | Freq: Two times a day (BID) | ORAL | 0 refills | Status: DC | PRN
  Filled 2024-04-07: qty 20, 10d supply, fill #0

## 2024-04-07 MED ORDER — SODIUM CHLORIDE 0.9 % IV BOLUS
500.0000 mL | Freq: Once | INTRAVENOUS | Status: AC
  Administered 2024-04-07: 500 mL via INTRAVENOUS

## 2024-04-07 MED ORDER — PREDNISONE 20 MG PO TABS
ORAL_TABLET | ORAL | 0 refills | Status: DC
  Filled 2024-04-07: qty 8, 7d supply, fill #0

## 2024-04-07 MED ORDER — IPRATROPIUM-ALBUTEROL 0.5-2.5 (3) MG/3ML IN SOLN: RESPIRATORY_TRACT | 0 refills | Status: DC

## 2024-04-07 NOTE — Plan of Care (Signed)
  Problem: Health Behavior/Discharge Planning: Goal: Ability to manage health-related needs will improve 04/07/2024 1638 by Kerwin Peels, RN Outcome: Adequate for Discharge 04/07/2024 0755 by Kerwin Peels, RN Outcome: Progressing   Problem: Clinical Measurements: Goal: Ability to maintain clinical measurements within normal limits will improve 04/07/2024 1638 by Kerwin Peels, RN Outcome: Adequate for Discharge 04/07/2024 0755 by Kerwin Peels, RN Outcome: Progressing Goal: Will remain free from infection 04/07/2024 1638 by Kerwin Peels, RN Outcome: Adequate for Discharge 04/07/2024 0755 by Kerwin Peels, RN Outcome: Progressing Goal: Diagnostic test results will improve 04/07/2024 1638 by Kerwin Peels, RN Outcome: Adequate for Discharge 04/07/2024 0755 by Kerwin Peels, RN Outcome: Progressing Goal: Respiratory complications will improve 04/07/2024 1638 by Kerwin Peels, RN Outcome: Adequate for Discharge 04/07/2024 0755 by Kerwin Peels, RN Outcome: Progressing Goal: Cardiovascular complication will be avoided Outcome: Adequate for Discharge   Problem: Activity: Goal: Risk for activity intolerance will decrease Outcome: Adequate for Discharge   Problem: Elimination: Goal: Will not experience complications related to bowel motility Outcome: Adequate for Discharge   Problem: Pain Managment: Goal: General experience of comfort will improve and/or be controlled Outcome: Adequate for Discharge   Problem: Safety: Goal: Ability to remain free from injury will improve Outcome: Adequate for Discharge   Problem: Activity: Goal: Ability to tolerate increased activity will improve Outcome: Adequate for Discharge   Problem: Clinical Measurements: Goal: Ability to maintain a body temperature in the normal range will improve Outcome: Adequate for Discharge   Problem: Respiratory: Goal: Ability to  maintain adequate ventilation will improve Outcome: Adequate for Discharge Goal: Ability to maintain a clear airway will improve Outcome: Adequate for Discharge   Problem: Education: Goal: Ability to describe self-care measures that may prevent or decrease complications (Diabetes Survival Skills Education) will improve Outcome: Adequate for Discharge   Problem: Coping: Goal: Ability to adjust to condition or change in health will improve Outcome: Adequate for Discharge   Problem: Fluid Volume: Goal: Ability to maintain a balanced intake and output will improve Outcome: Adequate for Discharge   Problem: Health Behavior/Discharge Planning: Goal: Ability to identify and utilize available resources and services will improve Outcome: Adequate for Discharge Goal: Ability to manage health-related needs will improve Outcome: Adequate for Discharge   Problem: Metabolic: Goal: Ability to maintain appropriate glucose levels will improve Outcome: Adequate for Discharge   Problem: Nutritional: Goal: Maintenance of adequate nutrition will improve Outcome: Adequate for Discharge Goal: Progress toward achieving an optimal weight will improve Outcome: Adequate for Discharge   Problem: Skin Integrity: Goal: Risk for impaired skin integrity will decrease Outcome: Adequate for Discharge   Problem: Tissue Perfusion: Goal: Adequacy of tissue perfusion will improve Outcome: Adequate for Discharge

## 2024-04-07 NOTE — Progress Notes (Signed)
 IVs removed, dressed, belongings packed, instructions reviewed with understanding verbalized, transferred to front entrance via wheelchair.

## 2024-04-07 NOTE — Plan of Care (Signed)
   Problem: Health Behavior/Discharge Planning: Goal: Ability to manage health-related needs will improve Outcome: Progressing   Problem: Clinical Measurements: Goal: Ability to maintain clinical measurements within normal limits will improve Outcome: Progressing Goal: Will remain free from infection Outcome: Progressing Goal: Diagnostic test results will improve Outcome: Progressing Goal: Respiratory complications will improve Outcome: Progressing

## 2024-04-07 NOTE — Discharge Summary (Signed)
 Physician Discharge Summary  Jose Patrick ZOX:[id number] DOB: Nov 28, 1950 DOA: 04/01/2024  PCP: [name]  Admit date: 04/01/2024 Discharge date: 04/07/2024  Time spent: 60 minutes  Recommendations for Outpatient Follow-up:  Follow-up with [facility] in 1 week. Follow-up with [facility] in 2 weeks.  On follow-up patient's diabetes will need to be reassessed as patient's long-acting insulin  was increased due to steroid taper.  Patient also needs a basic metabolic profile done to follow-up on electrolytes and renal function.   Discharge Diagnoses:  Principal Problem:   Community acquired pneumonia Active Problems:   Hyperlipidemia   GERD (gastroesophageal reflux disease)   OSA (obstructive sleep apnea)   S/P lobectomy of lung   Type 2 diabetes mellitus with diabetic neuropathy, unspecified (HCC)   COPD with acute exacerbation (HCC)   Chronic respiratory failure (HCC)   Discharge Condition: Stable and improved  Diet recommendation: Carb modified diet  Filed Weights   04/03/24 0507 04/04/24 0500 04/05/24 0500  Weight: 106.5 kg 107 kg 108.3 kg    History of present illness:  HPI per Dr. Wayland Haggis Jose Patrick is a 73 y.o. male with medical history significant for COPD, chronic hypoxic respiratory failure on 2-3 L [place], lung cancer s/p LUL lobectomy in 2013 and presumed RLL lung cancer s/p recent radiation, OSA on BiPAP, T2DM, CAD and HTN who presented to the ED for evaluation of worsening cough and shortness of breath.  Patient reports he was hospitalized with pneumonia about a month ago with slight improvement in his symptoms however he continued to have persistent shortness of breath and congested cough.  Due to his persistent symptoms, he was started on Levaquin and steroids by his pulmonologist.  He has had worsening dyspnea on exertion, persistent cough with occasional orange sputum and fatigue over the last few days so he called his  pulmonologist and was advised to present to the ED for further evaluation.  He also reports rib pain with his persistent cough but denies any fevers, chills, chest pain, dysuria, headache or dizziness.   ED Course: Initial vitals shows patient afebrile, normotensive with SpO2 of 93 to 94% on 2 L.  Labs overall unremarkable with normal CMP, CBC, lactic acid, INR and negative COVID, flu and RSV test. CXR shows worsening bilateral airspace opacities most pronounced in the lower lobes.  CTA PE study negative for PE but shows right lower lobe and inferior right upper lobe pneumonia. Patient received IV Rocephin , IV azithromycin  multiple DuoNebs and started on IV LR infusion. TRH was consulted for admission.  Hospital Course:  #1 recurrent pneumonia -Patient admitted with recurrent pneumonia noted to have had recurrent pneumonias over the last few months treated with multiple rounds of antibiotics most recently prior to admission with Levaquin and steroids in the outpatient setting by his pulmonologist. - Chest x-ray done on admission showed worsening bilateral airspace opacities more pronounced in the lower lobes. - CT angiogram chest done was negative for PE but did show right lower lobe and inferior right upper lobe pneumonia. -COVID-19 PCR negative, influenza A and B PCR negative, RSV PCR negative.  Respiratory viral panel noted to be negative and procalcitonin noted at less than 0.1. - Patient was placed empirically on IV antibiotics, pulmonary consulted and followed. - Pulmonary recommended 5 days of IV Zosyn  which patient completed during the hospitalization with some clinical improvement. - Outpatient follow-up with PCP and pulmonary.  2.  Chronic hypoxic respiratory failure/COPD with acute exacerbation/pulmonary fibrosis -Patient seen by pulmonary  during the hospitalization, patient maintained on IV steroids as well as nebulizer treatments of Pulmicort , Brovana ,yupelri  during the hospitalization  with some clinical improvement. - Patient subsequently transition from IV Solu-Medrol  to oral prednisone  taper as outlined by pulmonary. - Patient's O2 requirements remained stable at baseline by day of discharge. - Patient improved clinically, was close to baseline by day of discharge be discharged home on a steroid taper, scheduled DuoNebs x 5 days and then every 6 hours as needed as well as resumption of patient's home inhalers. - Outpatient follow-up with pulmonary in 1 to 2 weeks.  3.  History of lung cancer status post left upper lobe lobectomy 2013 -Presumed right lower lobe lung cancer status post completion of radiation therapy January 2025. - Outpatient follow-up with radiation oncology and primary oncologist as previously scheduled.  4.  Hypertension -Patient maintained on home regimen of losartan , amlodipine , metoprolol  during the hospitalization.  5.  Diabetes mellitus type 2 with hyperglycemia and neuropathy -Patient maintained on home regimen of Jardiance , as well as long-acting insulin  and sliding scale insulin . - Metformin  was held during the hospitalization will be resumed on discharge. - Patient also maintained on home regimen gabapentin  and pyridoxine .  6.  CAD/hyperlipidemia -Remains stable. - Patient maintained on aspirin , Ranexa , Zetia , statin.  7.  OSA -Unable to tolerate CPAP due to feeling of suffocation.  8.  GERD -Patient maintained on Pepcid  and PPI.  9.  Obesity class II -Lifestyle modification. - Outpatient follow-up with PCP.   Procedures: CT angiogram chest 04/01/2024 Chest x-ray 04/01/2024   Consultations: Pulmonary/PCCM: Dr. Marygrace Snellen 04/02/2024  Discharge Exam: Vitals:   04/07/24 0915 04/07/24 1337  BP:  (!) 107/56  Pulse:  65  Resp:  16  Temp:  98.5 F (36.9 C)  SpO2: 99% 92%    General: NAD Cardiovascular: RRR no murmurs rubs or gallops.  No JVD.  No pitting lower extremity edema. Respiratory: Minimal diffuse wheezing.  No  crackles.  Fair air movement.  Speaking in full sentences.  Discharge Instructions   Discharge Instructions     Diet Carb Modified   Complete by: As directed    Increase activity slowly   Complete by: As directed    Pulmonary Visit   Complete by: As directed    Recurrent pneumonia on chronic home O2.  Hospital follow-up.   Reason for referral: Other Pulmonary      Allergies as of 04/07/2024       Reactions   Chlorzoxazone Swelling   Psychosis   Tramadol Other (See Comments)   Muscles Tightness  Muscles Tightness         Medication List     STOP taking these medications    PEG-3350/Electrolytes/Ascorbat 100 g Solr Generic drug: peg 3350  powder   sucralfate  1 g tablet Commonly known as: Carafate        TAKE these medications    albuterol  108 (90 Base) MCG/ACT inhaler Commonly known as: VENTOLIN  HFA Inhale 2 puffs into the lungs every 6 (six) hours as needed for wheezing or shortness of breath.   amLODipine  10 MG tablet Commonly known as: NORVASC  Take 10 mg by mouth every evening.   aspirin  81 MG chewable tablet Chew 1 tablet (81 mg total) by mouth daily.   atorvastatin  40 MG tablet Commonly known as: LIPITOR Take 1 tablet (40 mg total) by mouth daily.   benzonatate  200 MG capsule Commonly known as: TESSALON  Take 1 capsule (200 mg total) by mouth 3 (three) times daily as needed for  cough.   chlorpheniramine-HYDROcodone 10-8 MG/5ML Commonly known as: TUSSIONEX Take 5 mLs by mouth every 12 (twelve) hours as needed for cough.   cycloSPORINE 0.05 % ophthalmic emulsion Commonly known as: RESTASIS Place 1 drop into both eyes 2 (two) times daily.   dextromethorphan -guaiFENesin  10-100 MG/5ML liquid Commonly known as: Tussin DM Take 5 mLs by mouth every 4 (four) hours as needed for cough.   ezetimibe  10 MG tablet Commonly known as: ZETIA  Take 10 mg by mouth daily.   famotidine  20 MG tablet Commonly known as: Pepcid  Take 1 tablet (20 mg total) by  mouth at bedtime.   feeding supplement Liqd Take 237 mLs by mouth 2 (two) times daily between meals.   ferrous sulfate  325 (65 FE) MG EC tablet Take 325 mg by mouth in the morning and at bedtime.   fluticasone  50 MCG/ACT nasal spray Commonly known as: FLONASE  Place 2 sprays into both nostrils daily.   fluticasone -salmeterol 250-50 MCG/ACT Aepb Commonly known as: Wixela Inhub ONE puff in the in morning and ONE puff in the evening   furosemide 20 MG tablet Commonly known as: LASIX Take 20 mg by mouth as needed for edema.   gabapentin  300 MG capsule Commonly known as: NEURONTIN  Take 300 mg by mouth 2 (two) times daily.   guaiFENesin  600 MG 12 hr tablet Commonly known as: Mucinex  Take 1 tablet (600 mg total) by mouth 2 (two) times daily as needed for up to 5 days for to loosen phlegm or cough.   insulin  glargine 100 UNIT/ML injection Commonly known as: LANTUS  Inject 0.25 mLs (25 Units total) into the skin daily. What changed:  how much to take how to take this when to take this   ipratropium-albuterol  0.5-2.5 (3) MG/3ML Soln Commonly known as: DUONEB Take 3 mLs by nebulization 3 (three) times daily for 5 days, THEN 3 mLs every 6 (six) hours as needed. Start taking on: Apr 07, 2024 What changed: See the new instructions.   Jardiance  10 MG Tabs tablet Generic drug: empagliflozin  Take 10 mg by mouth daily.   losartan  25 MG tablet Commonly known as: COZAAR  Take 25 mg by mouth daily. 1/2 tab daily   Metamucil Smooth Texture 58.6 % powder Generic drug: psyllium Take 1 packet by mouth daily.   metFORMIN  1000 MG tablet Commonly known as: GLUCOPHAGE  Take 1,000 mg by mouth 2 (two) times daily.   metoprolol  succinate 100 MG 24 hr tablet Commonly known as: TOPROL -XL Take 100 mg by mouth daily. Take with or immediately following a meal.   omeprazole  40 MG capsule Commonly known as: PRILOSEC Take 1 capsule (40 mg total) by mouth daily.   predniSONE  20 MG  tablet Commonly known as: DELTASONE  Take 2 tablets (40 mg total) by mouth daily with breakfast for 1 day, THEN 1 tablet (20 mg total) daily with breakfast for 6 days. Start taking on: Apr 08, 2024 What changed:  medication strength See the new instructions.   ranolazine  500 MG 12 hr tablet Commonly known as: RANEXA  Take 1 tablet (500 mg total) by mouth 2 (two) times daily.   Semaglutide  (2 MG/DOSE) 8 MG/3ML Sopn Inject 2 mg into the skin once a week. What changed: Another medication with the same name was removed. Continue taking this medication, and follow the directions you see here.   Spiriva  Respimat 2.5 MCG/ACT Aers Generic drug: Tiotropium Bromide  Monohydrate Inhale 2 puffs into the lungs daily.   Testosterone 20.25 MG/ACT (1.62%) Gel testosterone 20.25 mg/1.25 gram per pump  act.(1.62 %) transdermal gel   VITAMIN B6 PO Take 1 tablet by mouth daily.   Vitamin D  50 MCG (2000 UT) Caps Take 1 capsule by mouth daily.       Allergies  Allergen Reactions   Chlorzoxazone Swelling    Psychosis   Tramadol Other (See Comments)    Muscles Tightness  Muscles Tightness      Follow-up Information     [facility] Schedule an appointment as soon as possible for a visit in 2 week(s).   Contact information: [address] [facility] [place] [address] [phone]         Quillian Brunt, MD. Schedule an appointment as soon as possible for a visit in 1 week(s).   Specialty: Pulmonary Disease Contact information: [address] [place] [address] [phone]                  The results of significant diagnostics from this hospitalization (including imaging, microbiology, ancillary and laboratory) are listed below for reference.    Significant Diagnostic Studies: CT Angio Chest PE W and/or Wo Contrast Result Date: 04/01/2024 CLINICAL DATA:  Pulmonary embolism (PE) suspected, high prob. Pneumonia, shortness of  breath. EXAM: CT ANGIOGRAPHY CHEST WITH CONTRAST TECHNIQUE: Multidetector CT imaging of the chest was performed using the standard protocol during bolus administration of intravenous contrast. Multiplanar CT image reconstructions and MIPs were obtained to evaluate the vascular anatomy. RADIATION DOSE REDUCTION: This exam was performed according to the departmental dose-optimization program which includes automated exposure control, adjustment of the mA and/or kV according to patient size and/or use of iterative reconstruction technique. CONTRAST:  75mL OMNIPAQUE  IOHEXOL  350 MG/ML SOLN COMPARISON:  12/02/2023 FINDINGS: Cardiovascular: No filling defects in the pulmonary arteries to suggest pulmonary emboli. Heart is normal size. Aorta is normal caliber. Scattered coronary artery and aortic calcifications. Mediastinum/Nodes: Mildly enlarged right paratracheal lymph node measuring 11 mm, likely reactive. No axillary or hilar adenopathy. Trachea and esophagus are unremarkable. Thyroid unremarkable. Lungs/Pleura: Emphysema. Dense consolidation throughout the right lower lobe and in the inferior right upper lobe, progressed since prior study concerning for pneumonia. Numerous small nodules throughout the left lung, new since prior study. These are all 5 mm or less in size. No effusions. Upper Abdomen: No acute findings Musculoskeletal: Chest wall soft tissues are unremarkable. No acute bony abnormality. Review of the MIP images confirms the above findings. IMPRESSION: No evidence of pulmonary embolus. Dense consolidation in the right lower lobe and inferior right upper lobe concerning for pneumonia. Numerous scattered small nodules throughout the left lung, 5 mm or less in size, new since prior study. Recommend follow-up CT in 3 3-6 months to assess stability. Coronary artery disease. Aortic Atherosclerosis (ICD10-I70.0) and Emphysema (ICD10-J43.9). Electronically Signed   By: Janeece Mechanic M.D.   On: 04/01/2024 23:06    DG Chest Port 1 View Result Date: 04/01/2024 CLINICAL DATA:  Questionable sepsis - evaluate for abnormality. History of pneumonia. Shortness of breath. EXAM: PORTABLE CHEST 1 VIEW COMPARISON:  12/02/2023 FINDINGS: Diffuse bilateral airspace disease, worsening since prior study. These are most pronounced in the lower lobes. Heart is borderline in size. No definite effusions. No pneumothorax. No acute bony abnormality. IMPRESSION: Worsening bilateral airspace disease, most pronounced in the lower lobes. Findings could reflect edema or infection. Electronically Signed   By: Janeece Mechanic M.D.   On: 04/01/2024 22:15    Microbiology: Recent Results (from the past 240 hours)  Resp panel by RT-PCR (RSV, Flu A&B,  Covid) Anterior Nasal Swab     Status: None   Collection Time: 04/01/24  9:13 PM   Specimen: Anterior Nasal Swab  Result Value Ref Range Status   SARS Coronavirus 2 by RT PCR NEGATIVE NEGATIVE Final    Comment: (NOTE) SARS-CoV-2 target nucleic acids are NOT DETECTED.  The SARS-CoV-2 RNA is generally detectable in upper respiratory specimens during the acute phase of infection. The lowest concentration of SARS-CoV-2 viral copies this assay can detect is 138 copies/mL. A negative result does not preclude SARS-Cov-2 infection and should not be used as the sole basis for treatment or other patient management decisions. A negative result may occur with  improper specimen collection/handling, submission of specimen other than nasopharyngeal swab, presence of viral mutation(s) within the areas targeted by this assay, and inadequate number of viral copies(<138 copies/mL). A negative result must be combined with clinical observations, patient history, and epidemiological information. The expected result is Negative.  Fact Sheet for Patients:  BloggerCourse.com  Fact Sheet for Healthcare Providers:  SeriousBroker.it  This test is no t yet  approved or cleared by the [place]  FDA and  has been authorized for detection and/or diagnosis of SARS-CoV-2 by FDA under an Emergency Use Authorization (EUA). This EUA will remain  in effect (meaning this test can be used) for the duration of the COVID-19 declaration under Section 564(b)(1) of the Act, 21 U.S.C.section 360bbb-3(b)(1), unless the authorization is terminated  or revoked sooner.       Influenza A by PCR NEGATIVE NEGATIVE Final   Influenza B by PCR NEGATIVE NEGATIVE Final    Comment: (NOTE) The Xpert Xpress SARS-CoV-2/FLU/RSV plus assay is intended as an aid in the diagnosis of influenza from Nasopharyngeal swab specimens and should not be used as a sole basis for treatment. Nasal washings and aspirates are unacceptable for Xpert Xpress SARS-CoV-2/FLU/RSV testing.  Fact Sheet for Patients: BloggerCourse.com  Fact Sheet for Healthcare Providers: SeriousBroker.it  This test is not yet approved or cleared by the [place]  FDA and has been authorized for detection and/or diagnosis of SARS-CoV-2 by FDA under an Emergency Use Authorization (EUA). This EUA will remain in effect (meaning this test can be used) for the duration of the COVID-19 declaration under Section 564(b)(1) of the Act, 21 U.S.C. section 360bbb-3(b)(1), unless the authorization is terminated or revoked.     Resp Syncytial Virus by PCR NEGATIVE NEGATIVE Final    Comment: (NOTE) Fact Sheet for Patients: BloggerCourse.com  Fact Sheet for Healthcare Providers: SeriousBroker.it  This test is not yet approved or cleared by the [place]  FDA and has been authorized for detection and/or diagnosis of SARS-CoV-2 by FDA under an Emergency Use Authorization (EUA). This EUA will remain in effect (meaning this test can be used) for the duration of the COVID-19 declaration under Section 564(b)(1) of  the Act, 21 U.S.C. section 360bbb-3(b)(1), unless the authorization is terminated or revoked.  Performed at [facility], [address] [place], [place] [address]   Respiratory (~20 pathogens) panel by PCR     Status: None   Collection Time: 04/01/24  9:13 PM   Specimen: Nasopharyngeal Swab; Respiratory  Result Value Ref Range Status   Adenovirus NOT DETECTED NOT DETECTED Final   Coronavirus 229E NOT DETECTED NOT DETECTED Final    Comment: (NOTE) The Coronavirus on the Respiratory Panel, DOES NOT test for the novel  Coronavirus (2019 nCoV)    Coronavirus HKU1 NOT DETECTED NOT DETECTED Final   Coronavirus NL63 NOT DETECTED  NOT DETECTED Final   Coronavirus OC43 NOT DETECTED NOT DETECTED Final   Metapneumovirus NOT DETECTED NOT DETECTED Final   Rhinovirus / Enterovirus NOT DETECTED NOT DETECTED Final   Influenza A NOT DETECTED NOT DETECTED Final   Influenza B NOT DETECTED NOT DETECTED Final   Parainfluenza Virus 1 NOT DETECTED NOT DETECTED Final   Parainfluenza Virus 2 NOT DETECTED NOT DETECTED Final   Parainfluenza Virus 3 NOT DETECTED NOT DETECTED Final   Parainfluenza Virus 4 NOT DETECTED NOT DETECTED Final   Respiratory Syncytial Virus NOT DETECTED NOT DETECTED Final   Bordetella pertussis NOT DETECTED NOT DETECTED Final   Bordetella Parapertussis NOT DETECTED NOT DETECTED Final   Chlamydophila pneumoniae NOT DETECTED NOT DETECTED Final   Mycoplasma pneumoniae NOT DETECTED NOT DETECTED Final    Comment: Performed at [facility], [address] [place], [place] [address]  Blood Culture (routine x 2)     Status: None   Collection Time: 04/01/24  9:25 PM   Specimen: BLOOD  Result Value Ref Range Status   Specimen Description   Final    BLOOD RIGHT ANTECUBITAL Performed at [facility], [address] [place], [place] [address]    Special Requests   Final    BOTTLES DRAWN AEROBIC AND ANAEROBIC Blood Culture adequate  volume Performed at [facility], [address] [place], [place] [address]    Culture   Final    NO GROWTH 5 DAYS Performed at [facility], [address] [place], [place] [address]    Report Status 04/07/2024 FINAL  Final  Blood Culture (routine x 2)     Status: None   Collection Time: 04/01/24  9:58 PM   Specimen: BLOOD RIGHT HAND  Result Value Ref Range Status   Specimen Description   Final    BLOOD RIGHT HAND Performed at [facility], [address] [place], [place] [address]    Special Requests   Final    BOTTLES DRAWN AEROBIC AND ANAEROBIC Blood Culture results may not be optimal due to an inadequate volume of blood received in culture bottles Performed at [facility], [address], [place], [place] [address]    Culture   Final    NO GROWTH 5 DAYS Performed at [facility], [address] [place], [place] [address]    Report Status 04/07/2024 FINAL  Final  Expectorated Sputum Assessment w Gram Stain, Rflx to Resp Cult     Status: None   Collection Time: 04/01/24 11:39 PM  Result Value Ref Range Status   Specimen Description EXPECTORATED SPUTUM  Final   Special Requests NONE  Final   Sputum evaluation   Final    Sputum specimen not acceptable for testing.  Please recollect.   Baldomero Bone RN @ [phone] 04/03/24 CAL Performed at [facility], [address], [place], [place] [address]    Report Status 04/03/2024 FINAL  Final  MRSA Next Gen by PCR, Nasal     Status: None   Collection Time: 04/02/24  6:10 AM   Specimen: Nasopharyngeal Swab; Nasal Swab  Result Value Ref Range Status   MRSA by PCR Next Gen NOT DETECTED NOT DETECTED Final    Comment: (NOTE) The GeneXpert MRSA Assay (FDA approved for NASAL specimens only), is one component of a comprehensive MRSA colonization surveillance program. It is not intended to diagnose MRSA infection nor to guide or monitor treatment for MRSA  infections. Test performance is not FDA approved in patients less than 65 years old. Performed at [facility], [address], [place], [place] [address]   Expectorated Sputum Assessment w Gram Stain, Rflx to Resp Cult     Status: None   Collection Time: 04/02/24 10:00 PM   Specimen: Sputum  Result Value Ref Range Status   Specimen Description SPUTUM  Final   Special Requests NONE  Final   Sputum evaluation   Final    Sputum specimen not acceptable for testing.  Please recollect.   Performed at [facility], [address], [place], [place] [address]    Report Status 04/02/2024 FINAL  Final     Labs: Basic Metabolic Panel: Recent Labs  Lab 04/01/24 2125 04/02/24 0337 04/07/24 0348  NA 137 136 134*  K 3.8 4.3 4.4  CL 104 103 96*  CO2 25 26 29   GLUCOSE 176* 204* 162*  BUN 20 20 28*  CREATININE 1.07 1.09 1.09  CALCIUM  9.1 8.9 9.8  MG  --   --  2.1   Liver Function Tests: Recent Labs  Lab 04/01/24 2125  AST 18  ALT 28  ALKPHOS 58  BILITOT 0.8  PROT 5.8*  ALBUMIN 2.9*   No results for input(s): "LIPASE", "AMYLASE" in the last 168 hours. No results for input(s): "AMMONIA" in the last 168 hours. CBC: Recent Labs  Lab 04/01/24 2125 04/02/24 0337 04/07/24 0348  WBC 9.1 8.1 12.5*  NEUTROABS 5.9  --  9.4*  HGB 15.8 15.4 16.3  HCT 49.3 49.5 52.5*  MCV 81.8 82.9 84.4  PLT 270 247 294   Cardiac Enzymes: No results for input(s): "CKTOTAL", "CKMB", "CKMBINDEX", "TROPONINI" in the last 168 hours. BNP: BNP (last 3 results) Recent Labs    12/02/23 1610  BNP 38.4    ProBNP (last 3 results) No results for input(s): "PROBNP" in the last 8760 hours.  CBG: Recent Labs  Lab 04/06/24 1051 04/06/24 1649 04/06/24 2143 04/07/24 0737 04/07/24 1152  GLUCAP 236* 475* 246* 112* 208*       Signed:  Hilda Lovings MD.  [facility] 04/07/2024, 3:56 PM

## 2024-04-08 ENCOUNTER — Telehealth (HOSPITAL_BASED_OUTPATIENT_CLINIC_OR_DEPARTMENT_OTHER): Payer: Self-pay

## 2024-04-08 NOTE — Telephone Encounter (Signed)
 Copied from CRM [id number]. Topic: General - Other >> Apr 08, 2024  4:48 PM Eveleen Hinds B wrote: Reason for CRM:  [employer] and patient on the line Patient is requesting Portable oxygen concentrator and wants to know where the order was sent so he can get his supplies.

## 2024-04-13 NOTE — Telephone Encounter (Signed)
 Did we ever find out anything about this?

## 2024-04-14 ENCOUNTER — Ambulatory Visit: Payer: Self-pay

## 2024-04-14 NOTE — Telephone Encounter (Signed)
 Jose Patrick is not here at [facility] again until 04/22/2024 may need to be printed at market and given to her as Im not there

## 2024-04-14 NOTE — Telephone Encounter (Signed)
 FYI

## 2024-04-14 NOTE — Telephone Encounter (Signed)
 Copied from CRM [id number]. Topic: General - Other >> Apr 14, 2024 11:21 AM Jose Patrick wrote: Reason for CRM: patient is calling to get a message to Dr Marygrace Snellen they have admitted patient to the hospital . He replaced dr Washington Hacker . Struggling breathing even with the oxygen . Get real lightheaded . on antibiotics and done with those and still feeling bad  TRIAGE SUMMARY: BT is being triaged for dyspnea that seems to be at rest or with exertion and labored breathing. The patient also additionally has chest pain, in the middle of his chest. The chest pain is described as pressure. The patient is additionally coughing up dark orange sputum. This morning the patient reports that his oxygen level was at 72%. The patient is also reportedly on an antibiotic and still having these persistent symptoms. Advised the patient to go to the ED based on telephonic assessment and combination of symptom presentation, and history. The patient verbalized understanding and agreed to disposition.   E2C2 Pulmonary Triage - Initial Assessment Questions "Chief Complaint (e.g., cough, sob, wheezing, fever, chills, sweat or additional symptoms) *Go to specific symptom protocol after initial questions.  Breathing Difficulty Lightheadedness  "How long have symptoms been present?" A couple of days  Have you tested for COVID or Flu? Note: If not, ask patient if a home test can be taken. If so, instruct patient to call back for positive results. No  MEDICINES:   "Have you used any OTC meds to help with symptoms?" No If yes, ask "What medications?" None  "Have you used your inhalers/maintenance medication?" Yes If yes, "What medications?" All Rx'd  If inhaler, ask "How many puffs and how often?" Note: Review instructions on medication in the chart. As prescribed  OXYGEN: "Do you wear supplemental oxygen?" Yes If yes, "How many liters are you supposed to use?" 2L  "Do you monitor your oxygen levels?" Yes If yes, "What  is your reading (oxygen level) today?" 72 This morning, While on the phone, 90-91%  "What is your usual oxygen saturation reading?"  (Note: Pulmonary O2 sats should be 90% or greater) 92  Reason for Disposition  [1] MODERATE difficulty breathing (e.g., speaks in phrases, SOB even at rest, pulse 100-120) AND [2] NEW-onset or WORSE than normal  Answer Assessment - Initial Assessment Questions 1. RESPIRATORY STATUS: "Describe your breathing?" (e.g., wheezing, shortness of breath, unable to speak, severe coughing)      Dyspnea with exertion and rest, Labored Breathing  2. ONSET: "When did this breathing problem begin?"      Last couple of days  3. PATTERN "Does the difficult breathing come and go, or has it been constant since it started?"      Constant  4. SEVERITY: "How bad is your breathing?" (e.g., mild, moderate, severe)    - MILD: No SOB at rest, mild SOB with walking, speaks normally in sentences, can lie down, no retractions, pulse < 100.    - MODERATE: SOB at rest, SOB with minimal exertion and prefers to sit, cannot lie down flat, speaks in phrases, mild retractions, audible wheezing, pulse 100-120.    - SEVERE: Very SOB at rest, speaks in single words, struggling to breathe, sitting hunched forward, retractions, pulse > 120      Moderate to Severe  5. RECURRENT SYMPTOM: "Have you had difficulty breathing before?" If Yes, ask: "When was the last time?" and "What happened that time?"      No  6. CARDIAC HISTORY: "Do you have any history of  heart disease?" (e.g., heart attack, angina, bypass surgery, angioplasty)      PVCs  7. LUNG HISTORY: "Do you have any history of lung disease?"  (e.g., pulmonary embolus, asthma, emphysema)     Cancer, Emphysema, COPD  8. CAUSE: "What do you think is causing the breathing problem?"      Unsure  9. OTHER SYMPTOMS: "Do you have any other symptoms? (e.g., dizziness, runny nose, cough, chest pain, fever)     Congestion  10. O2 SATURATION  MONITOR:  "Do you use an oxygen saturation monitor (pulse oximeter) at home?" If Yes, ask: "What is your reading (oxygen level) today?" "What is your usual oxygen saturation reading?" (e.g., 95%)       91  12. TRAVEL: "Have you traveled out of the country in the last month?" (e.g., travel history, exposures)       No  Protocols used: Breathing Difficulty-A-AH

## 2024-04-15 ENCOUNTER — Ambulatory Visit: Payer: Self-pay | Admitting: Pulmonary Disease

## 2024-04-15 NOTE — Telephone Encounter (Signed)
 Response from [place]:  He already has oxygen. Have you confirmed with the veteran if he received the POC? If not, you will have to contact Home oxygen dept within the [place] and speak with  Pheobe Brass at [phone] ext [phone]

## 2024-04-15 NOTE — Telephone Encounter (Signed)
  Chief Complaint: info only Additional Notes: Call was initially disconnected so [facility] called pt back. Pt reports currently at [facility] and they are about to "work on him". Pt reports [facility] to [facility] yesterday. [facility] encouraged pt to be evaluated by [facility] H[facility] and to call back if needed. Patient verbalized understanding.  Copied from CRM [id number]. Topic: Clinical - Red Word Triage >> Apr 15, 2024 11:27 AM Hilton Lucky wrote: Red Word that prompted transfer to Nurse Triage: oxygen level unstable, sounds short of breath, ED advised to schedule with pulmonary, states has congestion and acid reflux risking aspiration States radiation side effects and wants to schedule with Dr. Marygrace Snellen.

## 2024-04-16 NOTE — Telephone Encounter (Signed)
 Pt needed POC order mailed to him as he is paying out of pocket for POC and his insurance will reimburse him 80% for device. Order mailed to him

## 2024-04-19 ENCOUNTER — Encounter (HOSPITAL_COMMUNITY): Payer: Self-pay

## 2024-04-19 ENCOUNTER — Emergency Department (HOSPITAL_COMMUNITY)

## 2024-04-19 ENCOUNTER — Inpatient Hospital Stay (HOSPITAL_COMMUNITY)

## 2024-04-19 ENCOUNTER — Other Ambulatory Visit: Payer: Self-pay

## 2024-04-19 ENCOUNTER — Inpatient Hospital Stay (HOSPITAL_COMMUNITY)
Admission: EM | Admit: 2024-04-19 | Discharge: 2024-05-02 | DRG: 166 | Disposition: A | Discharge status: Home Health | Attending: Internal Medicine | Admitting: Internal Medicine

## 2024-04-19 DIAGNOSIS — E785 Hyperlipidemia, unspecified: Secondary | ICD-10-CM | POA: Diagnosis present

## 2024-04-19 DIAGNOSIS — J441 Chronic obstructive pulmonary disease with (acute) exacerbation: Secondary | ICD-10-CM | POA: Diagnosis present

## 2024-04-19 DIAGNOSIS — J90 Pleural effusion, not elsewhere classified: Secondary | ICD-10-CM | POA: Diagnosis present

## 2024-04-19 DIAGNOSIS — Z7951 Long term (current) use of inhaled steroids: Secondary | ICD-10-CM

## 2024-04-19 DIAGNOSIS — R5381 Other malaise: Secondary | ICD-10-CM | POA: Diagnosis present

## 2024-04-19 DIAGNOSIS — G4733 Obstructive sleep apnea (adult) (pediatric): Secondary | ICD-10-CM | POA: Diagnosis present

## 2024-04-19 DIAGNOSIS — Z7984 Long term (current) use of oral hypoglycemic drugs: Secondary | ICD-10-CM | POA: Diagnosis not present

## 2024-04-19 DIAGNOSIS — J449 Chronic obstructive pulmonary disease, unspecified: Secondary | ICD-10-CM | POA: Diagnosis not present

## 2024-04-19 DIAGNOSIS — R042 Hemoptysis: Secondary | ICD-10-CM | POA: Diagnosis present

## 2024-04-19 DIAGNOSIS — I10 Essential (primary) hypertension: Secondary | ICD-10-CM | POA: Diagnosis present

## 2024-04-19 DIAGNOSIS — N401 Enlarged prostate with lower urinary tract symptoms: Secondary | ICD-10-CM | POA: Diagnosis present

## 2024-04-19 DIAGNOSIS — Z7409 Other reduced mobility: Secondary | ICD-10-CM | POA: Diagnosis present

## 2024-04-19 DIAGNOSIS — E1140 Type 2 diabetes mellitus with diabetic neuropathy, unspecified: Secondary | ICD-10-CM | POA: Diagnosis present

## 2024-04-19 DIAGNOSIS — Z923 Personal history of irradiation: Secondary | ICD-10-CM

## 2024-04-19 DIAGNOSIS — I2510 Atherosclerotic heart disease of native coronary artery without angina pectoris: Secondary | ICD-10-CM | POA: Diagnosis present

## 2024-04-19 DIAGNOSIS — Z7982 Long term (current) use of aspirin: Secondary | ICD-10-CM | POA: Diagnosis not present

## 2024-04-19 DIAGNOSIS — J9601 Acute respiratory failure with hypoxia: Secondary | ICD-10-CM | POA: Diagnosis not present

## 2024-04-19 DIAGNOSIS — Z85118 Personal history of other malignant neoplasm of bronchus and lung: Secondary | ICD-10-CM

## 2024-04-19 DIAGNOSIS — E119 Type 2 diabetes mellitus without complications: Secondary | ICD-10-CM | POA: Diagnosis not present

## 2024-04-19 DIAGNOSIS — Z888 Allergy status to other drugs, medicaments and biological substances status: Secondary | ICD-10-CM

## 2024-04-19 DIAGNOSIS — J439 Emphysema, unspecified: Secondary | ICD-10-CM | POA: Diagnosis present

## 2024-04-19 DIAGNOSIS — J189 Pneumonia, unspecified organism: Secondary | ICD-10-CM | POA: Diagnosis present

## 2024-04-19 DIAGNOSIS — R918 Other nonspecific abnormal finding of lung field: Secondary | ICD-10-CM | POA: Diagnosis not present

## 2024-04-19 DIAGNOSIS — H259 Unspecified age-related cataract: Secondary | ICD-10-CM | POA: Insufficient documentation

## 2024-04-19 DIAGNOSIS — F4310 Post-traumatic stress disorder, unspecified: Secondary | ICD-10-CM | POA: Diagnosis present

## 2024-04-19 DIAGNOSIS — K5909 Other constipation: Secondary | ICD-10-CM | POA: Diagnosis present

## 2024-04-19 DIAGNOSIS — Z794 Long term (current) use of insulin: Secondary | ICD-10-CM | POA: Diagnosis not present

## 2024-04-19 DIAGNOSIS — J1289 Other viral pneumonia: Principal | ICD-10-CM | POA: Diagnosis present

## 2024-04-19 DIAGNOSIS — E1142 Type 2 diabetes mellitus with diabetic polyneuropathy: Secondary | ICD-10-CM | POA: Diagnosis present

## 2024-04-19 DIAGNOSIS — K219 Gastro-esophageal reflux disease without esophagitis: Secondary | ICD-10-CM | POA: Diagnosis present

## 2024-04-19 DIAGNOSIS — Z79899 Other long term (current) drug therapy: Secondary | ICD-10-CM

## 2024-04-19 DIAGNOSIS — Z87891 Personal history of nicotine dependence: Secondary | ICD-10-CM

## 2024-04-19 DIAGNOSIS — Z9981 Dependence on supplemental oxygen: Secondary | ICD-10-CM | POA: Diagnosis not present

## 2024-04-19 DIAGNOSIS — J440 Chronic obstructive pulmonary disease with acute lower respiratory infection: Secondary | ICD-10-CM | POA: Diagnosis present

## 2024-04-19 DIAGNOSIS — C3491 Malignant neoplasm of unspecified part of right bronchus or lung: Secondary | ICD-10-CM | POA: Diagnosis not present

## 2024-04-19 DIAGNOSIS — Z7985 Long-term (current) use of injectable non-insulin antidiabetic drugs: Secondary | ICD-10-CM

## 2024-04-19 DIAGNOSIS — J9611 Chronic respiratory failure with hypoxia: Secondary | ICD-10-CM | POA: Diagnosis not present

## 2024-04-19 DIAGNOSIS — F319 Bipolar disorder, unspecified: Secondary | ICD-10-CM | POA: Diagnosis present

## 2024-04-19 DIAGNOSIS — F1020 Alcohol dependence, uncomplicated: Secondary | ICD-10-CM | POA: Insufficient documentation

## 2024-04-19 DIAGNOSIS — K449 Diaphragmatic hernia without obstruction or gangrene: Secondary | ICD-10-CM | POA: Diagnosis present

## 2024-04-19 DIAGNOSIS — N189 Chronic kidney disease, unspecified: Secondary | ICD-10-CM | POA: Insufficient documentation

## 2024-04-19 DIAGNOSIS — M48061 Spinal stenosis, lumbar region without neurogenic claudication: Secondary | ICD-10-CM | POA: Diagnosis present

## 2024-04-19 DIAGNOSIS — J9621 Acute and chronic respiratory failure with hypoxia: Secondary | ICD-10-CM | POA: Diagnosis present

## 2024-04-19 DIAGNOSIS — Z1152 Encounter for screening for COVID-19: Secondary | ICD-10-CM

## 2024-04-19 DIAGNOSIS — Z885 Allergy status to narcotic agent status: Secondary | ICD-10-CM

## 2024-04-19 DIAGNOSIS — C7801 Secondary malignant neoplasm of right lung: Secondary | ICD-10-CM | POA: Diagnosis present

## 2024-04-19 DIAGNOSIS — E66812 Obesity, class 2: Secondary | ICD-10-CM | POA: Diagnosis present

## 2024-04-19 DIAGNOSIS — Z6835 Body mass index (BMI) 35.0-35.9, adult: Secondary | ICD-10-CM

## 2024-04-19 DIAGNOSIS — Z801 Family history of malignant neoplasm of trachea, bronchus and lung: Secondary | ICD-10-CM

## 2024-04-19 DIAGNOSIS — Z818 Family history of other mental and behavioral disorders: Secondary | ICD-10-CM

## 2024-04-19 LAB — BASIC METABOLIC PANEL WITH GFR
Anion gap: 8 (ref 5–15)
BUN: 15 mg/dL (ref 8–23)
CO2: 25 mmol/L (ref 22–32)
Calcium: 9 mg/dL (ref 8.9–10.3)
Chloride: 104 mmol/L (ref 98–111)
Creatinine, Ser: 1.52 mg/dL — ABNORMAL HIGH (ref 0.61–1.24)
GFR, Estimated: 48 mL/min — ABNORMAL LOW (ref >60)
Glucose, Bld: 161 mg/dL — ABNORMAL HIGH (ref 70–99)
Potassium: 4.3 mmol/L (ref 3.5–5.1)
Sodium: 137 mmol/L (ref 135–145)

## 2024-04-19 LAB — PROCALCITONIN: Procalcitonin: <0.1 ng/mL

## 2024-04-19 LAB — CBC
HCT: 49.2 % (ref 39.0–52.0)
Hemoglobin: 15.5 g/dL (ref 13.0–17.0)
MCH: 26.3 pg (ref 26.0–34.0)
MCHC: 31.5 g/dL (ref 30.0–36.0)
MCV: 83.4 fL (ref 80.0–100.0)
Platelets: 209 10*3/uL (ref 150–400)
RBC: 5.9 MIL/uL — ABNORMAL HIGH (ref 4.22–5.81)
RDW: 15.6 % — ABNORMAL HIGH (ref 11.5–15.5)
WBC: 9.1 10*3/uL (ref 4.0–10.5)
nRBC: 0 % (ref 0.0–0.2)

## 2024-04-19 LAB — RESP PANEL BY RT-PCR (RSV, FLU A&B, COVID)  RVPGX2
Influenza A by PCR: NEGATIVE
Influenza B by PCR: NEGATIVE
Resp Syncytial Virus by PCR: NEGATIVE
SARS Coronavirus 2 by RT PCR: NEGATIVE

## 2024-04-19 LAB — SEDIMENTATION RATE: Sed Rate: 27 mm/h — ABNORMAL HIGH (ref 0–16)

## 2024-04-19 LAB — STREP PNEUMONIAE URINARY ANTIGEN: Strep Pneumo Urinary Antigen: NEGATIVE

## 2024-04-19 LAB — I-STAT CG4 LACTIC ACID, ED
Lactic Acid, Venous: 1.2 mmol/L (ref 0.5–1.9)
Lactic Acid, Venous: 1.3 mmol/L (ref 0.5–1.9)

## 2024-04-19 LAB — PROTIME-INR
INR: 1.1 (ref 0.8–1.2)
Prothrombin Time: 13.9 s (ref 11.4–15.2)

## 2024-04-19 LAB — GLUCOSE, CAPILLARY: Glucose-Capillary: 185 mg/dL — ABNORMAL HIGH (ref 70–99)

## 2024-04-19 LAB — C-REACTIVE PROTEIN: CRP: 4.1 mg/dL — ABNORMAL HIGH (ref <1.0)

## 2024-04-19 LAB — BRAIN NATRIURETIC PEPTIDE: B Natriuretic Peptide: 58.1 pg/mL (ref 0.0–100.0)

## 2024-04-19 MED ORDER — METOPROLOL SUCCINATE ER 50 MG PO TB24
100.0000 mg | ORAL_TABLET | Freq: Every day | ORAL | Status: DC
  Administered 2024-04-19 – 2024-05-02 (×13): 100 mg via ORAL
  Filled 2024-04-19 (×5): qty 2
  Filled 2024-04-19 (×3): qty 4
  Filled 2024-04-19: qty 2
  Filled 2024-04-19: qty 4
  Filled 2024-04-19 (×3): qty 2
  Filled 2024-04-19: qty 4

## 2024-04-19 MED ORDER — ALBUTEROL SULFATE (2.5 MG/3ML) 0.083% IN NEBU
2.5000 mg | INHALATION_SOLUTION | Freq: Four times a day (QID) | RESPIRATORY_TRACT | Status: DC | PRN
  Administered 2024-04-19 – 2024-04-30 (×4): 2.5 mg via RESPIRATORY_TRACT
  Filled 2024-04-19 (×5): qty 3

## 2024-04-19 MED ORDER — SODIUM CHLORIDE 0.9 % IV SOLN
100.0000 mg | Freq: Two times a day (BID) | INTRAVENOUS | Status: DC
  Administered 2024-04-19 – 2024-04-23 (×8): 100 mg via INTRAVENOUS
  Filled 2024-04-19 (×8): qty 100

## 2024-04-19 MED ORDER — ENOXAPARIN SODIUM 60 MG/0.6ML IJ SOSY
50.0000 mg | PREFILLED_SYRINGE | INTRAMUSCULAR | Status: DC
  Administered 2024-04-19 – 2024-04-20 (×2): 50 mg via SUBCUTANEOUS
  Filled 2024-04-19 (×3): qty 0.6

## 2024-04-19 MED ORDER — INSULIN ASPART 100 UNIT/ML IJ SOLN
0.0000 [IU] | Freq: Three times a day (TID) | INTRAMUSCULAR | Status: DC
  Administered 2024-04-20 (×2): 4 [IU] via SUBCUTANEOUS
  Administered 2024-04-21 (×2): 3 [IU] via SUBCUTANEOUS
  Administered 2024-04-21: 11 [IU] via SUBCUTANEOUS
  Administered 2024-04-22: 4 [IU] via SUBCUTANEOUS
  Filled 2024-04-19: qty 0.2

## 2024-04-19 MED ORDER — VITAMIN B-6 100 MG PO TABS
100.0000 mg | ORAL_TABLET | Freq: Every day | ORAL | Status: DC
  Administered 2024-04-19 – 2024-05-02 (×13): 100 mg via ORAL
  Filled 2024-04-19 (×14): qty 1

## 2024-04-19 MED ORDER — ARFORMOTEROL TARTRATE 15 MCG/2ML IN NEBU
15.0000 ug | INHALATION_SOLUTION | Freq: Two times a day (BID) | RESPIRATORY_TRACT | Status: DC
  Administered 2024-04-19 – 2024-05-02 (×24): 15 ug via RESPIRATORY_TRACT
  Filled 2024-04-19 (×26): qty 2

## 2024-04-19 MED ORDER — GABAPENTIN 300 MG PO CAPS
300.0000 mg | ORAL_CAPSULE | Freq: Two times a day (BID) | ORAL | Status: DC
  Administered 2024-04-19 – 2024-05-02 (×24): 300 mg via ORAL
  Filled 2024-04-19 (×26): qty 1

## 2024-04-19 MED ORDER — ACETAMINOPHEN 650 MG RE SUPP: 650.0000 mg | Freq: Four times a day (QID) | RECTAL | Status: DC | PRN

## 2024-04-19 MED ORDER — IPRATROPIUM BROMIDE 0.02 % IN SOLN
0.5000 mg | Freq: Once | RESPIRATORY_TRACT | Status: AC
  Administered 2024-04-19: 0.5 mg via RESPIRATORY_TRACT
  Filled 2024-04-19: qty 2.5

## 2024-04-19 MED ORDER — ASPIRIN 81 MG PO CHEW
81.0000 mg | CHEWABLE_TABLET | Freq: Every day | ORAL | Status: DC
  Administered 2024-04-20 – 2024-04-21 (×2): 81 mg via ORAL
  Filled 2024-04-19 (×2): qty 1

## 2024-04-19 MED ORDER — METHYLPREDNISOLONE SODIUM SUCC 40 MG IJ SOLR
40.0000 mg | Freq: Every day | INTRAMUSCULAR | Status: DC
  Administered 2024-04-20 – 2024-04-22 (×3): 40 mg via INTRAVENOUS
  Filled 2024-04-19 (×3): qty 1

## 2024-04-19 MED ORDER — SODIUM CHLORIDE 0.9 % IV SOLN
2.0000 g | Freq: Once | INTRAVENOUS | Status: AC
  Administered 2024-04-19: 2 g via INTRAVENOUS
  Filled 2024-04-19: qty 12.5

## 2024-04-19 MED ORDER — RANOLAZINE ER 500 MG PO TB12
500.0000 mg | ORAL_TABLET | Freq: Two times a day (BID) | ORAL | Status: DC
  Administered 2024-04-19 – 2024-05-02 (×24): 500 mg via ORAL
  Filled 2024-04-19 (×26): qty 1

## 2024-04-19 MED ORDER — ONDANSETRON HCL 4 MG/2ML IJ SOLN: 4.0000 mg | Freq: Four times a day (QID) | INTRAMUSCULAR | Status: DC | PRN

## 2024-04-19 MED ORDER — BENZONATATE 100 MG PO CAPS
200.0000 mg | ORAL_CAPSULE | Freq: Three times a day (TID) | ORAL | Status: DC | PRN
  Administered 2024-04-24 – 2024-04-30 (×4): 200 mg via ORAL
  Filled 2024-04-19 (×5): qty 2

## 2024-04-19 MED ORDER — HYDROCOD POLI-CHLORPHE POLI ER 10-8 MG/5ML PO SUER
5.0000 mL | Freq: Two times a day (BID) | ORAL | Status: DC | PRN
  Administered 2024-04-24 – 2024-04-30 (×3): 5 mL via ORAL
  Filled 2024-04-19 (×4): qty 5

## 2024-04-19 MED ORDER — BUDESONIDE 0.5 MG/2ML IN SUSP
0.5000 mg | Freq: Two times a day (BID) | RESPIRATORY_TRACT | Status: DC
  Administered 2024-04-19 – 2024-05-02 (×24): 0.5 mg via RESPIRATORY_TRACT
  Filled 2024-04-19 (×28): qty 2

## 2024-04-19 MED ORDER — ATORVASTATIN CALCIUM 40 MG PO TABS
40.0000 mg | ORAL_TABLET | Freq: Every evening | ORAL | Status: DC
  Administered 2024-04-19 – 2024-05-01 (×13): 40 mg via ORAL
  Filled 2024-04-19 (×13): qty 1

## 2024-04-19 MED ORDER — MAGNESIUM OXIDE -MG SUPPLEMENT 400 (240 MG) MG PO TABS
400.0000 mg | ORAL_TABLET | Freq: Every day | ORAL | Status: DC
  Administered 2024-04-20 – 2024-05-02 (×12): 400 mg via ORAL
  Filled 2024-04-19 (×13): qty 1

## 2024-04-19 MED ORDER — EZETIMIBE 10 MG PO TABS
10.0000 mg | ORAL_TABLET | Freq: Every day | ORAL | Status: DC
  Administered 2024-04-19 – 2024-05-02 (×13): 10 mg via ORAL
  Filled 2024-04-19 (×14): qty 1

## 2024-04-19 MED ORDER — CHLORHEXIDINE GLUCONATE CLOTH 2 % EX PADS
6.0000 | MEDICATED_PAD | Freq: Every day | CUTANEOUS | Status: DC
Start: 2024-04-19 — End: 2024-04-29
  Administered 2024-04-19 – 2024-04-29 (×11): 6 via TOPICAL

## 2024-04-19 MED ORDER — PANTOPRAZOLE SODIUM 40 MG PO TBEC
40.0000 mg | DELAYED_RELEASE_TABLET | Freq: Every day | ORAL | Status: DC
  Administered 2024-04-20 – 2024-05-02 (×12): 40 mg via ORAL
  Filled 2024-04-19 (×13): qty 1

## 2024-04-19 MED ORDER — INSULIN GLARGINE-YFGN 100 UNIT/ML ~~LOC~~ SOLN
25.0000 [IU] | Freq: Every day | SUBCUTANEOUS | Status: DC
  Administered 2024-04-19 – 2024-04-30 (×12): 25 [IU] via SUBCUTANEOUS
  Filled 2024-04-19 (×14): qty 0.25

## 2024-04-19 MED ORDER — EMPAGLIFLOZIN 25 MG PO TABS
25.0000 mg | ORAL_TABLET | Freq: Every day | ORAL | Status: DC
  Administered 2024-04-19 – 2024-04-20 (×2): 25 mg via ORAL
  Filled 2024-04-19 (×3): qty 1

## 2024-04-19 MED ORDER — METHYLPREDNISOLONE SODIUM SUCC 125 MG IJ SOLR
125.0000 mg | Freq: Once | INTRAMUSCULAR | Status: AC
  Administered 2024-04-19: 125 mg via INTRAVENOUS
  Filled 2024-04-19: qty 2

## 2024-04-19 MED ORDER — ALBUTEROL SULFATE (2.5 MG/3ML) 0.083% IN NEBU
5.0000 mg | INHALATION_SOLUTION | Freq: Once | RESPIRATORY_TRACT | Status: AC
  Administered 2024-04-19: 5 mg via RESPIRATORY_TRACT
  Filled 2024-04-19: qty 6

## 2024-04-19 MED ORDER — LOSARTAN POTASSIUM 25 MG PO TABS
12.5000 mg | ORAL_TABLET | Freq: Every day | ORAL | Status: DC
  Administered 2024-04-19: 12.5 mg via ORAL
  Filled 2024-04-19: qty 0.5
  Filled 2024-04-19: qty 1

## 2024-04-19 MED ORDER — ONDANSETRON HCL 4 MG PO TABS: 4.0000 mg | ORAL_TABLET | Freq: Four times a day (QID) | ORAL | Status: DC | PRN

## 2024-04-19 MED ORDER — GUAIFENESIN-CODEINE 100-10 MG/5ML PO SOLN
5.0000 mL | Freq: Once | ORAL | Status: AC
  Administered 2024-04-19: 5 mL via ORAL
  Filled 2024-04-19: qty 5

## 2024-04-19 MED ORDER — VITAMIN D 25 MCG (1000 UNIT) PO TABS
1000.0000 [IU] | ORAL_TABLET | Freq: Every day | ORAL | Status: DC
  Administered 2024-04-19 – 2024-05-02 (×13): 1000 [IU] via ORAL
  Filled 2024-04-19 (×14): qty 1

## 2024-04-19 MED ORDER — REVEFENACIN 175 MCG/3ML IN SOLN
175.0000 ug | Freq: Every day | RESPIRATORY_TRACT | Status: DC
  Administered 2024-04-20 – 2024-05-02 (×12): 175 ug via RESPIRATORY_TRACT
  Filled 2024-04-19 (×14): qty 3

## 2024-04-19 MED ORDER — METFORMIN HCL 500 MG PO TABS
1000.0000 mg | ORAL_TABLET | Freq: Two times a day (BID) | ORAL | Status: DC
  Administered 2024-04-19 – 2024-04-20 (×2): 1000 mg via ORAL
  Filled 2024-04-19 (×2): qty 2

## 2024-04-19 MED ORDER — FERROUS SULFATE 325 (65 FE) MG PO TABS
325.0000 mg | ORAL_TABLET | Freq: Two times a day (BID) | ORAL | Status: DC
  Administered 2024-04-20 – 2024-05-02 (×23): 325 mg via ORAL
  Filled 2024-04-19 (×24): qty 1

## 2024-04-19 MED ORDER — LACTATED RINGERS IV SOLN: INTRAVENOUS | Status: DC

## 2024-04-19 MED ORDER — ACETAMINOPHEN 325 MG PO TABS
650.0000 mg | ORAL_TABLET | Freq: Four times a day (QID) | ORAL | Status: DC | PRN
  Administered 2024-04-25: 650 mg via ORAL
  Filled 2024-04-19 (×2): qty 2

## 2024-04-19 MED ORDER — SODIUM CHLORIDE 0.9 % IV SOLN
2.0000 g | Freq: Two times a day (BID) | INTRAVENOUS | Status: DC
  Administered 2024-04-20: 2 g via INTRAVENOUS
  Filled 2024-04-19: qty 12.5

## 2024-04-19 NOTE — Consult Note (Signed)
 NAME:  Jose Patrick, MRN:  [id number], DOB:  25-Aug-1951, LOS: 0 ADMISSION DATE:  04/19/2024, CONSULTATION DATE:  04/19/24 REFERRING MD:  Bonita Bussing , CHIEF COMPLAINT:  cough    History of Present Illness:  73 yo M PMH COPD, lung cancer, chronic hypoxic resp failure on 2L who presented to [facility] ED 6/2 for eval of persistent cough, suspected PNA. Pt states this has been ongoing x 3 mo. Significantly worse at night and when he is laying down. Endorses hx acid reflux, doesn't know if he still takes anything for this. Does not notice any coughing when he eats/drinks, no difficulty swallowing.  Cough gets so bad that he gets chest pain and dizziness. Previously cough was productive and orange, now generally non-productive, sometimes white and bubbly. Has had several abx courses. Last seen by pulm 5/15 in office at which time ED presentation was recommended for IV abx -- admitted 5/15-5/21, 5d zosyn  + steroids.  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 Started on doxy cefepime Admitted to [facility] PCCM consulted in this setting   No leukocytosis or fever BMP w Cr 1.5  LA WNL  Pertinent  Medical History  COPD Lung cancer Chronic hypoxic resp failure  OSA intolerant of CPAP  DM2  CAD HTN  GERD  Significant Hospital Events: Including procedures, antibiotic start and stop dates in addition to other pertinent events   6/2 [facility] ED [facility] admit PCCM consult   Interim History / Subjective:  Consulted   Objective    Blood pressure 138/66, pulse 82, temperature 98.2 F (36.8 C), temperature source Oral, resp. rate 19, height 5\' 8"  (1.727 m), weight 105.2 kg, SpO2 98%.        Intake/Output Summary (Last 24 hours) at 04/19/2024 1515 Last data filed at 04/19/2024 1439 Gross per 24 hour  Intake 83.68 ml  Output --  Net 83.68 ml   Filed Weights   04/19/24 1141  Weight: 105.2 kg    Examination: General: Chronically and acutely ill M  HENT: NCAT pink mm  Lungs: dry cough  Cardiovascular: rr cap refill  brisk  Abdomen: soft ndnt  Extremities: no obvious acute joint deformity  Neuro: AAOx4 GU: defer  Resolved problem list   Assessment and Plan    R>L CAP v HCAP Chronic hypoxic resp failure COPD w possible exacerbation  OSA Lung cancer P -abx -- agree w doxy cefepime to start  -steroids  -send RVP, sputum, PCT -CT chest -PPI (his pattern of significantly worse cough at night makes me wonder if GERD is contributing) -PRN cough suppressant  -IS, pulm hygiene   Best Practice (right click and "Reselect all SmartList Selections" daily)   Per primary   Labs   CBC: Recent Labs  Lab 04/19/24 1144  WBC 9.1  HGB 15.5  HCT 49.2  MCV 83.4  PLT 209    Basic Metabolic Panel: Recent Labs  Lab 04/19/24 1144  NA 137  K 4.3  CL 104  CO2 25  GLUCOSE 161*  BUN 15  CREATININE 1.52*  CALCIUM  9.0   GFR: Estimated Creatinine Clearance: 51.6 mL/min (A) (by C-G formula based on SCr of 1.52 mg/dL (H)). Recent Labs  Lab 04/19/24 1144 04/19/24 1423  WBC 9.1  --   LATICACIDVEN  --  1.2    Liver Function Tests: No results for input(s): "AST", "ALT", "ALKPHOS", "BILITOT", "PROT", "ALBUMIN" in the last 168 hours. No results for input(s): "LIPASE", "AMYLASE" in the last 168 hours. No  results for input(s): "AMMONIA" in the last 168 hours.  ABG    Component Value Date/Time   TCO2 25 12/02/2023 1616     Coagulation Profile: No results for input(s): "INR", "PROTIME" in the last 168 hours.  Cardiac Enzymes: No results for input(s): "CKTOTAL", "CKMB", "CKMBINDEX", "TROPONINI" in the last 168 hours.  HbA1C: Hgb A1c MFr Bld  Date/Time Value Ref Range Status  04/03/2024 03:52 AM 7.8 (H) 4.8 - 5.6 % Final    Comment:    (NOTE) Pre diabetes:          5.7%-6.4%  Diabetes:              >6.4%  Glycemic control for   <7.0% adults with diabetes   09/30/2012 10:31 AM 7.6 (H) 4.6 - 6.5 % Final    Comment:    Glycemic Control Guidelines for People with Diabetes:Non  Diabetic:  <6%Goal of Therapy: <7%Additional Action Suggested:  >8%     CBG: No results for input(s): "GLUCAP" in the last 168 hours.  Review of Systems:     Review of Systems  Constitutional:  Positive for malaise/fatigue. Negative for chills, fever and weight loss.  HENT: Negative.    Respiratory:  Positive for cough, shortness of breath and wheezing. Negative for hemoptysis and sputum production.   Cardiovascular:  Positive for chest pain and orthopnea. Negative for leg swelling.  Gastrointestinal:  Positive for constipation.  Musculoskeletal:  Positive for back pain.  Skin: Negative.   Neurological:  Positive for dizziness.  Endo/Heme/Allergies:  Positive for environmental allergies.    Past Medical History:  He,  has a past medical history of ALLERGIC RHINITIS (10/08/2007), BENIGN PROSTATIC HYPERTROPHY (09/28/2010), Bipolar affective disorder (HCC) (09/30/2012), COLONIC POLYPS, HX OF (10/08/2007), Complication of anesthesia, CONSTIPATION, CHRONIC, HX OF (08/10/2007), DIABETES MELLITUS, TYPE II (10/08/2007), ERECTILE DYSFUNCTION (10/08/2007), HYPERLIPIDEMIA (10/08/2007), HYPERTENSION (03/20/2009), PTSD (post-traumatic stress disorder) (09/30/2012), and SPINAL STENOSIS, LUMBAR (10/08/2007).   Surgical History:   Past Surgical History:  Procedure Laterality Date   COLONOSCOPY WITH PROPOFOL  N/A 04/09/2021   Procedure: COLONOSCOPY WITH PROPOFOL ;  Surgeon: Irby Mannan, MD;  Location: [facility] ENDOSCOPY;  Service: Endoscopy;  Laterality: N/A;   cyst off right buttock     ESOPHAGOGASTRODUODENOSCOPY (EGD) WITH PROPOFOL  N/A 04/09/2021   Procedure: ESOPHAGOGASTRODUODENOSCOPY (EGD) WITH PROPOFOL ;  Surgeon: Irby Mannan, MD;  Location: [facility] ENDOSCOPY;  Service: Endoscopy;  Laterality: N/A;   PREPATELLAR BURSA EXCISION     removal right   s/p bilat hydrocelectomy  2008   x 2   TONSILLECTOMY       Social History:   reports that he quit smoking about 12 years ago. His smoking  use included cigarettes. He started smoking about 53 years ago. He has a 61.5 pack-year smoking history. He has never used smokeless tobacco. He reports current alcohol use. He reports that he does not use drugs.   Family History:  His family history includes Allergies in his sister; Anxiety disorder in his mother; Cancer in his mother; Depression in his mother; Ulcers in his mother.   Allergies Allergies  Allergen Reactions   Chlorzoxazone Swelling and Other (See Comments)    Psychosis  (Parafon Forte) ABSOLUTELY CANNOT HAVE THIS!!   Tramadol Other (See Comments)    Muscles Tightness      Home Medications  Prior to Admission medications   Medication Sig Start Date End Date Taking? Authorizing Provider  albuterol  (VENTOLIN  HFA) 108 (90 Base) MCG/ACT inhaler Inhale 2 puffs into the lungs every 6 (  six) hours as needed for wheezing or shortness of breath. 03/19/22  Yes Quillian Brunt, MD  aspirin  81 MG chewable tablet Chew 1 tablet (81 mg total) by mouth daily. 12/19/11  Yes Roslyn Coombe, MD  atorvastatin  (LIPITOR) 40 MG tablet Take 1 tablet (40 mg total) by mouth daily. Patient taking differently: Take 40 mg by mouth every evening. 08/04/23  Yes Thukkani, Arun K, MD  Cholecalciferol  (VITAMIN D3) 1000 units CAPS Take 1,000 Units by mouth daily.   Yes [provider]  cycloSPORINE (RESTASIS) 0.05 % ophthalmic emulsion Place 1 drop into both eyes 2 (two) times daily. 01/12/24  Yes [provider]  ezetimibe  (ZETIA ) 10 MG tablet Take 10 mg by mouth daily. 12/09/23  Yes [provider]  famotidine  (PEPCID ) 20 MG tablet Take 1 tablet (20 mg total) by mouth at bedtime. 04/07/24  Yes Armenta Landau, MD  ferrous sulfate  325 (65 FE) MG EC tablet Take 325 mg by mouth in the morning and at bedtime.   Yes [provider]  fluticasone  (FLONASE ) 50 MCG/ACT nasal spray Place 2 sprays into both nostrils in the morning. 08/11/23  Yes [provider]   fluticasone -salmeterol (WIXELA INHUB) 250-50 MCG/ACT AEPB ONE puff in the in morning and ONE puff in the evening Patient taking differently: Inhale 1 puff into the lungs in the morning. 01/14/24  Yes Quillian Brunt, MD  furosemide (LASIX) 20 MG tablet Take 20 mg by mouth as needed for edema.   Yes [provider]  gabapentin  (NEURONTIN ) 300 MG capsule Take 300 mg by mouth 2 (two) times daily. Take 300 mg by mouth in the morning & at bedtime- and an additional 300 mg once a day as needed for unresolved neuropathy   Yes [provider]  glucose 4 GM chewable tablet Chew 1 tablet by mouth as needed for low blood sugar.   Yes [provider]  guaiFENesin -codeine 100-10 MG/5ML syrup Take 5 mLs by mouth 2 (two) times daily as needed for cough.   Yes [provider]  insulin  glargine (LANTUS ) 100 UNIT/ML injection Inject 0.25 mLs (25 Units total) into the skin daily. Patient taking differently: Inject 20-30 Units into the skin at bedtime. 04/07/24  Yes Armenta Landau, MD  ipratropium-albuterol  (DUONEB) 0.5-2.5 (3) MG/3ML SOLN Take 3 mLs by nebulization 3 (three) times daily for 5 days, THEN 3 mLs every 6 (six) hours as needed. 04/07/24 04/12/25 Yes Armenta Landau, MD  JARDIANCE  25 MG TABS tablet Take 25 mg by mouth at bedtime.   Yes [provider]  losartan  (COZAAR ) 25 MG tablet Take 12.5 mg by mouth daily. 01/01/23  Yes [provider]  Magnesium Oxide 420 MG TABS Take 420 mg by mouth daily with breakfast.   Yes [provider]  metFORMIN  (GLUCOPHAGE ) 1000 MG tablet Take 1,000 mg by mouth 2 (two) times daily. 09/14/21  Yes [provider]  metoprolol  succinate (TOPROL -XL) 100 MG 24 hr tablet Take 100 mg by mouth daily. Take with or immediately following a meal.   Yes [provider]  omeprazole  (PRILOSEC) 40 MG capsule Take 1 capsule (40 mg total) by mouth daily. Patient taking differently: Take 40 mg by mouth daily  before breakfast. 04/07/24  Yes Armenta Landau, MD  OXYGEN Inhale 2 L/min into the lungs continuous.   Yes [provider]  predniSONE  (DELTASONE ) 10 MG tablet Take 10-40 mg by mouth See admin instructions. Take 40 mg by mouth with breakfast  for 5 days then 30 mg once daily for 5 days then 20 mg once daily for 5 days, and 10 mg once daily for 5 days 04/03/24  Yes [provider]  pyridoxine  (B-6) 100 MG tablet Take 100 mg by mouth daily.   Yes [provider]  ranolazine  (RANEXA ) 500 MG 12 hr tablet Take 1 tablet (500 mg total) by mouth 2 (two) times daily. 02/11/23  Yes Sonny Dust, MD  Semaglutide , 2 MG/DOSE, 8 MG/3ML SOPN Inject 2 mg into the skin every Sunday. 02/14/24  Yes [provider]  SYSTANE BALANCE 0.6 % SOLN Place 1 drop into both eyes in the morning.   Yes [provider]  Testosterone 1.62 % GEL Place 2 Pump onto the skin in the morning.   Yes [provider]  Tiotropium Bromide  Monohydrate (SPIRIVA  RESPIMAT) 2.5 MCG/ACT AERS Inhale 2 puffs into the lungs daily. 01/14/24  Yes Quillian Brunt, MD  benzonatate  (TESSALON ) 200 MG capsule Take 1 capsule (200 mg total) by mouth 3 (three) times daily as needed for cough. Patient not taking: Reported on 04/19/2024 04/07/24   Armenta Landau, MD  dextromethorphan -guaiFENesin  (TUSSIN DM) 10-100 MG/5ML liquid Take 5 mLs by mouth every 4 (four) hours as needed for cough. Patient not taking: Reported on 04/19/2024 03/19/24   Antonio Baumgarten, NP  feeding supplement (ENSURE ENLIVE / ENSURE PLUS) LIQD Take 237 mLs by mouth 2 (two) times daily between meals. Patient not taking: Reported on 04/19/2024 04/07/24   Armenta Landau, MD  psyllium (METAMUCIL SMOOTH TEXTURE) 58.6 % powder Take 1 packet by mouth daily. Patient not taking: Reported on 04/19/2024 12/20/21   Marnee Sink, MD     Critical care time: na       Eston Hence MSN, AGACNP-BC [facility] Pulmonary/Critical Care  Medicine Amion for pager 04/19/2024, 3:49 PM

## 2024-04-19 NOTE — ED Provider Notes (Signed)
 [facility] AT [facility] Provider Note   CSN: [id number] Arrival date & time: 04/19/24  1117     History  Chief Complaint  Patient presents with   Shortness of Breath    Jose Patrick is a 73 y.o. male.  HPI    73 y.o. 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  Pt accompanied by his wife.   According the patient, he was admitted to the hospital late May for emphysema and cough with pneumonia.  He was discharged with 2 L of oxygen.  However, his breathing has not improved.  His cough is worsening.  He is having subjective fevers.  Cough is mostly nonproductive.  However he is unable to sleep well, breathes well.  Patient now feels short of breath with just speaking.  A month ago, prior to his admission he was able to be more active, go to doctors appointments etc.  Patient was seen by pulmonary doctors on Friday.  They had advised that he go to the ER, but he had declined coming to the ED, but the weekend he only got worse therefore he decided to come to the ER today.  Home Medications Prior to Admission medications   Medication Sig Start Date End Date Taking? Authorizing Provider  albuterol  (VENTOLIN  HFA) 108 (90 Base) MCG/ACT inhaler Inhale 2 puffs into the lungs every 6 (six) hours as needed for wheezing or shortness of breath. 03/19/22   Quillian Brunt, MD  amLODipine  (NORVASC ) 10 MG tablet Take 10 mg by mouth every evening. 11/23/21   [provider]  aspirin  81 MG chewable tablet Chew 1 tablet (81 mg total) by mouth daily. 12/19/11   Roslyn Coombe, MD  atorvastatin  (LIPITOR) 40 MG tablet Take 1 tablet (40 mg total) by mouth daily. 08/04/23   Thukkani, Arun K, MD   benzonatate  (TESSALON ) 200 MG capsule Take 1 capsule (200 mg total) by mouth 3 (three) times daily as needed for cough. 04/07/24   Armenta Landau, MD  chlorpheniramine-HYDROcodone (TUSSIONEX) 10-8 MG/5ML Take 5 mLs by mouth every 12 (twelve) hours as needed for cough. 03/23/24   [provider]  Cholecalciferol  (VITAMIN D ) 2000 UNITS CAPS Take 1 capsule by mouth daily.    [provider]  cycloSPORINE (RESTASIS) 0.05 % ophthalmic emulsion Place 1 drop into both eyes 2 (two) times daily. 01/12/24   [provider]  dextromethorphan -guaiFENesin  (TUSSIN DM) 10-100 MG/5ML liquid Take 5 mLs by mouth every 4 (four) hours as needed for cough. 03/19/24   Antonio Baumgarten, NP  ezetimibe  (ZETIA ) 10 MG tablet Take 10 mg by mouth daily. 12/09/23   [provider]  famotidine  (PEPCID ) 20 MG tablet Take 1 tablet (20 mg total) by mouth at bedtime. 04/07/24   Armenta Landau, MD  feeding supplement (ENSURE ENLIVE / ENSURE PLUS) LIQD Take 237 mLs by mouth 2 (two) times daily between meals. 04/07/24   Armenta Landau, MD  ferrous sulfate  325 (65 FE) MG EC tablet Take 325 mg by mouth in the morning and at bedtime.    [provider]  fluticasone  (FLONASE ) 50 MCG/ACT nasal spray Place 2 sprays into both nostrils daily. 08/11/23   [provider]  fluticasone -salmeterol (  WIXELA INHUB) 250-50 MCG/ACT AEPB ONE puff in the in morning and ONE puff in the evening 01/14/24   Quillian Brunt, MD  furosemide (LASIX) 20 MG tablet Take 20 mg by mouth as needed for edema.    [provider]  gabapentin  (NEURONTIN ) 300 MG capsule Take 300 mg by mouth 2 (two) times daily.    [provider]  insulin  glargine (LANTUS ) 100 UNIT/ML injection Inject 0.25 mLs (25 Units total) into the skin daily. 04/07/24   Armenta Landau, MD  ipratropium-albuterol  (DUONEB) 0.5-2.5 (3) MG/3ML SOLN Take 3 mLs by nebulization 3 (three) times daily for 5 days, THEN 3 mLs every 6  (six) hours as needed. 04/07/24 04/12/25  Armenta Landau, MD  JARDIANCE  10 MG TABS tablet Take 10 mg by mouth daily. 02/23/21   [provider]  losartan  (COZAAR ) 25 MG tablet Take 25 mg by mouth daily. 1/2 tab daily 01/01/23   [provider]  metFORMIN  (GLUCOPHAGE ) 1000 MG tablet Take 1,000 mg by mouth 2 (two) times daily. 09/14/21   [provider]  metoprolol  succinate (TOPROL -XL) 100 MG 24 hr tablet Take 100 mg by mouth daily. Take with or immediately following a meal.    [provider]  omeprazole  (PRILOSEC) 40 MG capsule Take 1 capsule (40 mg total) by mouth daily. 04/07/24   Armenta Landau, MD  psyllium (METAMUCIL SMOOTH TEXTURE) 58.6 % powder Take 1 packet by mouth daily. 12/20/21   Marnee Sink, MD  Pyridoxine  HCl (VITAMIN B6 PO) Take 1 tablet by mouth daily.     [provider]  ranolazine  (RANEXA ) 500 MG 12 hr tablet Take 1 tablet (500 mg total) by mouth 2 (two) times daily. 02/11/23   Sonny Dust, MD  Semaglutide , 2 MG/DOSE, 8 MG/3ML SOPN Inject 2 mg into the skin once a week. 02/14/24   [provider]  Testosterone 20.25 MG/ACT (1.62%) GEL testosterone 20.25 mg/1.25 gram per pump act.(1.62 %) transdermal gel 12/24/21   [provider]  Tiotropium Bromide  Monohydrate (SPIRIVA  RESPIMAT) 2.5 MCG/ACT AERS Inhale 2 puffs into the lungs daily. 01/14/24   Quillian Brunt, MD      Allergies    Chlorzoxazone and Tramadol    Review of Systems   Review of Systems  All other systems reviewed and are negative.   Physical Exam Updated Vital Signs BP (!) 119/48   Pulse 78   Temp 98.2 F (36.8 C) (Oral)   Resp 17   Ht 5\' 8"  (1.727 m)   Wt 105.2 kg   SpO2 96%   BMI 35.28 kg/m  Physical Exam Vitals and nursing note reviewed.  Constitutional:      Appearance: He is well-developed.  HENT:     Head: Normocephalic and atraumatic.  Eyes:     Extraocular Movements: Extraocular movements intact.      Conjunctiva/sclera: Conjunctivae normal.  Cardiovascular:     Rate and Rhythm: Normal rate and regular rhythm.  Pulmonary:     Effort: Pulmonary effort is normal.     Breath sounds: Decreased breath sounds present. No wheezing, rhonchi or rales.  Abdominal:     General: Bowel sounds are normal. There is no distension.     Palpations: Abdomen is soft. There is no mass.     Tenderness: There is no abdominal tenderness.  Musculoskeletal:        General: No deformity.     Cervical back: Normal range of motion and neck supple.  Right lower leg: Edema present.     Left lower leg: Edema present.     Comments: 1+ pitting edema, lower extremity bilaterally  Skin:    General: Skin is warm.  Neurological:     Mental Status: He is alert and oriented to person, place, and time.     ED Results / Procedures / Treatments   Labs (all labs ordered are listed, but only abnormal results are displayed) Labs Reviewed  BASIC METABOLIC PANEL WITH GFR - Abnormal; Notable for the following components:      Result Value   Glucose, Bld 161 (*)    Creatinine, Ser 1.52 (*)    GFR, Estimated 48 (*)    All other components within normal limits  CBC - Abnormal; Notable for the following components:   RBC 5.90 (*)    RDW 15.6 (*)    All other components within normal limits  RESP PANEL BY RT-PCR (RSV, FLU A&B, COVID)  RVPGX2  CULTURE, BLOOD (ROUTINE X 2)  CULTURE, BLOOD (ROUTINE X 2)  BRAIN NATRIURETIC PEPTIDE  PROTIME-INR  I-STAT CG4 LACTIC ACID, ED    EKG EKG Interpretation Date/Time:  Monday April 19 2024 11:42:13 EDT Ventricular Rate:  88 PR Interval:  160 QRS Duration:  81 QT Interval:  342 QTC Calculation: 414 R Axis:   1  Text Interpretation: Sinus rhythm Probable left atrial enlargement No acute changes No significant change since last tracing Confirmed by Deatra Face [phone]) on 04/19/2024 1:20:08 PM  Radiology DG Chest 2 View Result Date: 04/19/2024 CLINICAL DATA:  Cough,  shortness of breath EXAM: CHEST - 2 VIEW COMPARISON:  04/01/2024 FINDINGS: Diffuse bilateral airspace disease again noted, right greater than left, similar to prior study. Possible small bilateral effusions. Heart and mediastinal contours within normal limits. Aortic atherosclerosis. IMPRESSION: Continued diffuse bilateral airspace disease, right greater than left concerning for pneumonia. Findings similar to prior study. Electronically Signed   By: Janeece Mechanic M.D.   On: 04/19/2024 12:48    Procedures .Critical Care  Performed by: Deatra Face, MD Authorized by: Deatra Face, MD   Critical care provider statement:    Critical care time (minutes):  48   Critical care was necessary to treat or prevent imminent or life-threatening deterioration of the following conditions:  Respiratory failure   Critical care was time spent personally by me on the following activities:  Development of treatment plan with patient or surrogate, discussions with consultants, evaluation of patient's response to treatment, examination of patient, ordering and review of laboratory studies, ordering and review of radiographic studies, ordering and performing treatments and interventions, pulse oximetry, re-evaluation of patient's condition, review of old charts and obtaining history from patient or surrogate     Medications Ordered in ED Medications  lactated ringers  infusion (has no administration in time range)  ceFEPIme (MAXIPIME) 2 g in sodium chloride  0.9 % 100 mL IVPB (has no administration in time range)  doxycycline (VIBRAMYCIN) 100 mg in sodium chloride  0.9 % 250 mL IVPB (has no administration in time range)  albuterol  (PROVENTIL ) (2.5 MG/3ML) 0.083% nebulizer solution 5 mg (has no administration in time range)  ipratropium (ATROVENT ) nebulizer solution 0.5 mg (has no administration in time range)  methylPREDNISolone  sodium succinate (SOLU-MEDROL ) 125 mg/2 mL injection 125 mg (has no administration in  time range)  guaiFENesin -codeine 100-10 MG/5ML solution 5 mL (has no administration in time range)    ED Course/ Medical Decision Making/ A&P  Medical Decision Making Amount and/or Complexity of Data Reviewed Labs: ordered. Radiology: ordered.  Risk Prescription drug management. Decision regarding hospitalization.   73 year old male with history of COPD/emphysema with chronic hypoxic respiratory failure, CAD comes in with chief complaint of shortness of breath and worsening cough.  I have reviewed patient's records including recent discharge summary. Patient also had CT angiogram PE that was negative for blood clot last time.  On the CT scan he was found to have pneumonia.  Patient finished a course of Levaquin at that time.  Differential diagnosis for this patient includes worsening COPD/emphysema, severe anemia, pneumonitis, pneumonia, pleural effusion, pulmonary edema/CHF.  Initial plan is to get basic labs, give patient DuoNeb.  Patient visibly noted to be tachypneic, even with speaking.  He will need admission to the hospital.  Reassessment: I have independently interpreted patient's chest x-ray.  He has worsening infiltrate on x-ray of the chest.  Labs are reassuring including white count.  He received Levaquin recently.  I do not think this is hospital-acquired pneumonia, but we should probably put him on cefepime and azithromycin .  I will defer addition of vancomycin to admitting service.  Will give patient some IV fluids.  Clinically patient is not septic.  I have consulted pulmonary service to see the patient as well.  Final Clinical Impression(s) / ED Diagnoses Final diagnoses:  Acute on chronic respiratory failure with hypoxia (HCC)  Pneumonia due to infectious organism, unspecified laterality, unspecified part of lung    Rx / DC Orders ED Discharge Orders     None         Deatra Face, MD 04/19/24 1343

## 2024-04-19 NOTE — ED Notes (Signed)
 Pt is a fall risk pt was put on a yellow arm ban, pt has on blue sock due to pt feet to big for the yellow ones

## 2024-04-19 NOTE — Telephone Encounter (Signed)
 Copied from CRM [id number]. Topic: General - Other >> Apr 19, 2024 11:04 AM Ambrose Junk wrote: Reason for CRM:  Patient off Dr [place] Hacker, last saw Dr Marygrace Snellen, calling to let the provider know he is on his way to [facility] ED for pneumonia. States same issue he's been having. He wanted to make sure I alert the team.  FYI Dr. Marygrace Snellen

## 2024-04-19 NOTE — Sepsis Progress Note (Signed)
 Elink will follow per sepsis protocol.

## 2024-04-19 NOTE — ED Notes (Signed)
 DR. Bonita Bussing informed patient still noted to be wheezing and oxygen increased to 4lpm. Order to give Albuterol  treatment/

## 2024-04-19 NOTE — H&P (Signed)
 History and Physical    Patient: Jose Patrick ZOX:[id number] DOB: September 13, 1951 DOA: 04/19/2024 DOS: the patient was seen and examined on 04/19/2024 PCP: [name]  Patient coming from: Home  Chief Complaint:  Chief Complaint  Patient presents with   Shortness of Breath   HPI: Jose Patrick is a 73 y.o. male with medical history significant of allergic rhinitis, BPH, bipolar affective disorder, colon polyps, chronic constipation, type 2 diabetes, erectile dysfunction, hyperlipidemia, PTSD, lumbar spinal stenosis who was recently hospitalized for pneumonia about a month ago, but stated that he did not have significant improvement after being discharged home.  He was seen by his pulmonologist who gave him a course of levofloxacin and prednisone .  Today, he presented to the emergency department due to progressively worse dyspnea associated productive cough for the past few days.  He stated that his O2 saturation at home was in the 70s earlier today.  In triage, the patient has labored breathing.  He has subjective fever and chills.  No night sweats.He denied rhinorrhea, sore throat or hemoptysis.  No chest pain, palpitations, diaphoresis, PND, orthopnea or pitting edema of the lower extremities.  No abdominal pain, nausea, emesis, diarrhea, constipation, melena or hematochezia.  No flank pain, dysuria, frequency or hematuria.  No polyuria, polydipsia, polyphagia or blurred vision.   Lab work: CBC showed white count of 9.1, hemoglobin 15.5 g/dL platelets 811.  BMP showed normal electrolytes.  Glucose 161, BUN 15 creatinine 1.52 mg/dL.  BNP 58.1 pg/mL.  Imaging: 2 view chest radiograph showing continued diffuse bilateral airspace disease, right greater than left concerning for pneumonia.  Findings similar to prior study.   ED course: Initial vital signs were temperature 98.2 F, pulse 93, respirations 17, BP 119/63 mmHg O2 sat 92% on nasal cannula oxygen at 2 LPM.  The patient was  started on a LR infusion at 150 mL/h, cefepime and doxycycline.  Review of Systems: As mentioned in the history of present illness. All other systems reviewed and are negative.  Past Medical History:  Diagnosis Date   ALLERGIC RHINITIS 10/08/2007   Qualifier: Diagnosis of  By: Autry Legions MD, Alveda Aures    BENIGN PROSTATIC HYPERTROPHY 09/28/2010   Qualifier: Diagnosis of  By: Autry Legions MD, Alveda Aures    Bipolar affective disorder (HCC) 09/30/2012   COLONIC POLYPS, HX OF 10/08/2007   Qualifier: Diagnosis of  By: Autry Legions MD, Alveda Aures    Complication of anesthesia    sleeps awhile afterwards per patient   CONSTIPATION, CHRONIC, HX OF 08/10/2007   Qualifier: Diagnosis of  By: Jarold Merlin    DIABETES MELLITUS, TYPE II 10/08/2007   Qualifier: Diagnosis of  By: Autry Legions MD, Alveda Aures    ERECTILE DYSFUNCTION 10/08/2007   Qualifier: Diagnosis of  By: Autry Legions MD, Alveda Aures    HYPERLIPIDEMIA 10/08/2007   Qualifier: Diagnosis of  By: Autry Legions MD, Alveda Aures    HYPERTENSION 03/20/2009   Qualifier: Diagnosis of  By: Autry Legions MD, Alveda Aures    PTSD (post-traumatic stress disorder) 09/30/2012   SPINAL STENOSIS, LUMBAR 10/08/2007   Per Dr Larrie Po - [facility]   Qualifier: Diagnosis of  By: Autry Legions MD, Alveda Aures     Past Surgical History:  Procedure Laterality Date   COLONOSCOPY WITH PROPOFOL  N/A 04/09/2021   Procedure: COLONOSCOPY WITH PROPOFOL ;  Surgeon: Irby Mannan, MD;  Location: [facility] ENDOSCOPY;  Service: Endoscopy;  Laterality: N/A;   cyst off right buttock     ESOPHAGOGASTRODUODENOSCOPY (EGD) WITH  PROPOFOL  N/A 04/09/2021   Procedure: ESOPHAGOGASTRODUODENOSCOPY (EGD) WITH PROPOFOL ;  Surgeon: Irby Mannan, MD;  Location: [facility] ENDOSCOPY;  Service: Endoscopy;  Laterality: N/A;   PREPATELLAR BURSA EXCISION     removal right   s/p bilat hydrocelectomy  2008   x 2   TONSILLECTOMY     Social History:  reports that he quit smoking about 12 years ago. His smoking use included cigarettes. He started smoking  about 53 years ago. He has a 61.5 pack-year smoking history. He has never used smokeless tobacco. He reports current alcohol use. He reports that he does not use drugs.  Allergies  Allergen Reactions   Chlorzoxazone Swelling    Psychosis   Tramadol Other (See Comments)    Muscles Tightness  Muscles Tightness      Family History  Problem Relation Age of Onset   Cancer Mother    Ulcers Mother        Stomach   Depression Mother    Anxiety disorder Mother    Allergies Sister     Prior to Admission medications   Medication Sig Start Date End Date Taking? Authorizing Provider  albuterol  (VENTOLIN  HFA) 108 (90 Base) MCG/ACT inhaler Inhale 2 puffs into the lungs every 6 (six) hours as needed for wheezing or shortness of breath. 03/19/22   Quillian Brunt, MD  amLODipine  (NORVASC ) 10 MG tablet Take 10 mg by mouth every evening. 11/23/21   [provider]  aspirin  81 MG chewable tablet Chew 1 tablet (81 mg total) by mouth daily. 12/19/11   Roslyn Coombe, MD  atorvastatin  (LIPITOR) 40 MG tablet Take 1 tablet (40 mg total) by mouth daily. 08/04/23   Thukkani, Arun K, MD  benzonatate  (TESSALON ) 200 MG capsule Take 1 capsule (200 mg total) by mouth 3 (three) times daily as needed for cough. 04/07/24   Armenta Landau, MD  chlorpheniramine-HYDROcodone (TUSSIONEX) 10-8 MG/5ML Take 5 mLs by mouth every 12 (twelve) hours as needed for cough. 03/23/24   [provider]  Cholecalciferol  (VITAMIN D ) 2000 UNITS CAPS Take 1 capsule by mouth daily.    [provider]  cycloSPORINE (RESTASIS) 0.05 % ophthalmic emulsion Place 1 drop into both eyes 2 (two) times daily. 01/12/24   [provider]  dextromethorphan -guaiFENesin  (TUSSIN DM) 10-100 MG/5ML liquid Take 5 mLs by mouth every 4 (four) hours as needed for cough. 03/19/24   Antonio Baumgarten, NP  ezetimibe  (ZETIA ) 10 MG tablet Take 10 mg by mouth daily. 12/09/23   [provider]  famotidine  (PEPCID ) 20 MG tablet  Take 1 tablet (20 mg total) by mouth at bedtime. 04/07/24   Armenta Landau, MD  feeding supplement (ENSURE ENLIVE / ENSURE PLUS) LIQD Take 237 mLs by mouth 2 (two) times daily between meals. 04/07/24   Armenta Landau, MD  ferrous sulfate  325 (65 FE) MG EC tablet Take 325 mg by mouth in the morning and at bedtime.    [provider]  fluticasone  (FLONASE ) 50 MCG/ACT nasal spray Place 2 sprays into both nostrils daily. 08/11/23   [provider]  fluticasone -salmeterol Zachery Hermes INHUB) 250-50 MCG/ACT AEPB ONE puff in the in morning and ONE puff in the evening 01/14/24   Quillian Brunt, MD  furosemide (LASIX) 20 MG tablet Take 20 mg by mouth as needed for edema.    [provider]  gabapentin  (NEURONTIN ) 300 MG capsule Take 300 mg by mouth 2 (two) times daily.    [provider]  insulin  glargine (LANTUS ) 100 UNIT/ML injection Inject 0.25 mLs (25 Units total) into the skin daily. 04/07/24   Armenta Landau, MD  ipratropium-albuterol  (DUONEB) 0.5-2.5 (3) MG/3ML SOLN Take 3 mLs by nebulization 3 (three) times daily for 5 days, THEN 3 mLs every 6 (six) hours as needed. 04/07/24 04/12/25  Armenta Landau, MD  JARDIANCE  10 MG TABS tablet Take 10 mg by mouth daily. 02/23/21   [provider]  losartan  (COZAAR ) 25 MG tablet Take 25 mg by mouth daily. 1/2 tab daily 01/01/23   [provider]  metFORMIN  (GLUCOPHAGE ) 1000 MG tablet Take 1,000 mg by mouth 2 (two) times daily. 09/14/21   [provider]  metoprolol  succinate (TOPROL -XL) 100 MG 24 hr tablet Take 100 mg by mouth daily. Take with or immediately following a meal.    [provider]  omeprazole  (PRILOSEC) 40 MG capsule Take 1 capsule (40 mg total) by mouth daily. 04/07/24   Armenta Landau, MD  psyllium (METAMUCIL SMOOTH TEXTURE) 58.6 % powder Take 1 packet by mouth daily. 12/20/21   Marnee Sink, MD  Pyridoxine  HCl (VITAMIN B6 PO) Take 1 tablet by mouth daily.     [provider]  ranolazine  (RANEXA ) 500 MG 12 hr tablet Take 1 tablet (500 mg total) by mouth 2 (two) times daily. 02/11/23   Sonny Dust, MD  Semaglutide , 2 MG/DOSE, 8 MG/3ML SOPN Inject 2 mg into the skin once a week. 02/14/24   [provider]  Testosterone 20.25 MG/ACT (1.62%) GEL testosterone 20.25 mg/1.25 gram per pump act.(1.62 %) transdermal gel 12/24/21   [provider]  Tiotropium Bromide  Monohydrate (SPIRIVA  RESPIMAT) 2.5 MCG/ACT AERS Inhale 2 puffs into the lungs daily. 01/14/24   Quillian Brunt, MD    Physical Exam: Vitals:   04/19/24 1123 04/19/24 1124 04/19/24 1141 04/19/24 1330  BP: 119/63   (!) 119/48  Pulse: 93   78  Resp: 17 (!) 23  17  Temp: 98.2 F (36.8 C)     TempSrc: Oral     SpO2: 92%   96%  Weight:   105.2 kg   Height:   5\' 8"  (1.727 m)    Physical Exam Vitals and nursing note reviewed.  Constitutional:      General: He is awake. He is not in acute distress.    Appearance: He is obese. He is ill-appearing.     Interventions: Nasal cannula in place.  HENT:     Head: Normocephalic.     Nose: No rhinorrhea.  Eyes:     General: No scleral icterus.    Pupils: Pupils are equal, round, and reactive to light.  Neck:     Vascular: No JVD.  Cardiovascular:     Rate and Rhythm: Normal rate and regular rhythm.     Heart sounds: S1 normal and S2 normal.  Pulmonary:     Breath sounds: Wheezing, rhonchi and rales present.  Abdominal:     General: There is no distension.     Tenderness: There is no abdominal tenderness. There is no right CVA tenderness or left CVA tenderness.  Musculoskeletal:     Cervical back: Neck supple.     Right lower leg: No edema.     Left lower leg: No edema.  Skin:    General: Skin is warm and dry.  Neurological:     General: No focal deficit present.     Mental Status: He is alert and oriented to person, place, and  time.  Psychiatric:        Mood and Affect: Mood normal.        Behavior: Behavior  normal. Behavior is cooperative.    Data Reviewed:  Results are pending, will review when available.  EKG: Vent. rate 93 BPM PR interval 158 ms QRS duration 79 ms QT/QTcB 341/425 ms P-R-T axes 4 -5 33 Sinus rhythm Probable left atrial enlargement  Assessment and Plan: Principal Problem:   Acute on chronic respiratory failure with hypoxia (HCC) In the setting of:   Bilateral pneumonia Associated with:   COPD with acute exacerbation (HCC) Admit to PCU/inpatient. Continue supplemental oxygen. BiPAP ventilation as needed. Scheduled and as needed bronchodilators. Continue cefepime 2 g IVPB every 12 hours. Continue doxycycline 100 mg IVPB every 12 hours. Check strep pneumoniae urinary antigen. Check sputum Gram stain, culture and sensitivity. Follow-up blood culture and sensitivity. Follow-up CBC and chemistry in the morning. Pulmonology consult appreciated. -Will follow the recommendations.  Active Problems:   OSA (obstructive sleep apnea) BiPAP nightly.    Hyperlipidemia Continue atorvastatin  40 mg p.o. every evening. Continue ezetimibe  10 mg p.o. daily.    Essential hypertension Continue losartan  12.5 mg p.o. daily. Continue metoprolol  succinate 100 mg p.o. daily.    GERD (gastroesophageal reflux disease) Pantoprazole  40 mg p.o. daily.    Type 2 diabetes mellitus with diabetic neuropathy, unspecified (HCC) Carbohydrate modified diet. CBG monitoring with RI SS. Check hemoglobin A1c.    Class 2 obesity  Current BMI 35.28 kg/m. Would benefit from lifestyle modifications. Follow-up closely with PCP and/or bariatric clinic.    Advance Care Planning:   Code Status: Full Code   Consults:   Family Communication:   Severity of Illness: The appropriate patient status for this patient is INPATIENT. Inpatient status is judged to be reasonable and necessary in order to provide the required intensity of service to ensure the patient's safety. The patient's  presenting symptoms, physical exam findings, and initial radiographic and laboratory data in the context of their chronic comorbidities is felt to place them at high risk for further clinical deterioration. Furthermore, it is not anticipated that the patient will be medically stable for discharge from the hospital within 2 midnights of admission.   * I certify that at the point of admission it is my clinical judgment that the patient will require inpatient hospital care spanning beyond 2 midnights from the point of admission due to high intensity of service, high risk for further deterioration and high frequency of surveillance required.*  Author: Danice Dural, MD 04/19/2024 1:46 PM  For on call review [email or web address].   This document was prepared using Dragon voice recognition software and may contain some unintended transcription errors.

## 2024-04-19 NOTE — Progress Notes (Signed)
 RT transported pt from ED to room 1241 without complications.

## 2024-04-19 NOTE — ED Notes (Signed)
 Severe shortness of breath noted after getting up and using the urinal. Patient states "I am really short of breath". Wheezing still noted. Dr. Bonita Bussing at bedside to reassess the patient and will put order for Bipap. RT has been called. Oxygen increased to 5 LPM at this time.

## 2024-04-19 NOTE — ED Triage Notes (Signed)
 PT arrives via POV. PT reports worsening cough and sob over the past few days. States his o2 was in the 70s this morning. Pt arrives AxOx4. Labored breathing noted during triage. Hx of copd. SPO2 at 92% on patient's baseline 2LNC.

## 2024-04-20 ENCOUNTER — Inpatient Hospital Stay (HOSPITAL_COMMUNITY)

## 2024-04-20 DIAGNOSIS — J90 Pleural effusion, not elsewhere classified: Secondary | ICD-10-CM | POA: Diagnosis not present

## 2024-04-20 DIAGNOSIS — J9601 Acute respiratory failure with hypoxia: Secondary | ICD-10-CM

## 2024-04-20 DIAGNOSIS — J189 Pneumonia, unspecified organism: Secondary | ICD-10-CM | POA: Diagnosis not present

## 2024-04-20 LAB — GLUCOSE, CAPILLARY
Glucose-Capillary: 119 mg/dL — ABNORMAL HIGH (ref 70–99)
Glucose-Capillary: 145 mg/dL — ABNORMAL HIGH (ref 70–99)
Glucose-Capillary: 180 mg/dL — ABNORMAL HIGH (ref 70–99)
Glucose-Capillary: 230 mg/dL — ABNORMAL HIGH (ref 70–99)

## 2024-04-20 LAB — COMPREHENSIVE METABOLIC PANEL WITH GFR
ALT: 27 U/L (ref 0–44)
AST: 18 U/L (ref 15–41)
Albumin: 2.6 g/dL — ABNORMAL LOW (ref 3.5–5.0)
Alkaline Phosphatase: 47 U/L (ref 38–126)
Anion gap: 7 (ref 5–15)
BUN: 16 mg/dL (ref 8–23)
CO2: 24 mmol/L (ref 22–32)
Calcium: 8.9 mg/dL (ref 8.9–10.3)
Chloride: 104 mmol/L (ref 98–111)
Creatinine, Ser: 1.09 mg/dL (ref 0.61–1.24)
GFR, Estimated: >60 mL/min (ref >60)
Glucose, Bld: 158 mg/dL — ABNORMAL HIGH (ref 70–99)
Potassium: 5.4 mmol/L — ABNORMAL HIGH (ref 3.5–5.1)
Sodium: 135 mmol/L (ref 135–145)
Total Bilirubin: 0.6 mg/dL (ref 0.0–1.2)
Total Protein: 5.7 g/dL — ABNORMAL LOW (ref 6.5–8.1)

## 2024-04-20 LAB — LACTATE DEHYDROGENASE, PLEURAL OR PERITONEAL FLUID: LD, Fluid: 118 U/L — ABNORMAL HIGH (ref 3–23)

## 2024-04-20 LAB — BODY FLUID CELL COUNT WITH DIFFERENTIAL
Eos, Fluid: 1 %
Lymphs, Fluid: 70 %
Monocyte-Macrophage-Serous Fluid: 19 % — ABNORMAL LOW (ref 50–90)
Neutrophil Count, Fluid: 10 % (ref 0–25)
Total Nucleated Cell Count, Fluid: 1406 uL — ABNORMAL HIGH (ref 0–1000)

## 2024-04-20 LAB — CBC
HCT: 47.8 % (ref 39.0–52.0)
Hemoglobin: 14.8 g/dL (ref 13.0–17.0)
MCH: 25.9 pg — ABNORMAL LOW (ref 26.0–34.0)
MCHC: 31 g/dL (ref 30.0–36.0)
MCV: 83.7 fL (ref 80.0–100.0)
Platelets: 217 10*3/uL (ref 150–400)
RBC: 5.71 MIL/uL (ref 4.22–5.81)
RDW: 15.3 % (ref 11.5–15.5)
WBC: 8.7 10*3/uL (ref 4.0–10.5)
nRBC: 0 % (ref 0.0–0.2)

## 2024-04-20 LAB — RESPIRATORY PANEL BY PCR

## 2024-04-20 LAB — PROTEIN, PLEURAL OR PERITONEAL FLUID: Total protein, fluid: <3 g/dL

## 2024-04-20 LAB — PROCALCITONIN: Procalcitonin: <0.1 ng/mL

## 2024-04-20 LAB — LACTATE DEHYDROGENASE: LDH: 133 U/L (ref 98–192)

## 2024-04-20 LAB — MRSA NEXT GEN BY PCR, NASAL: MRSA by PCR Next Gen: NOT DETECTED

## 2024-04-20 MED ORDER — SENNOSIDES-DOCUSATE SODIUM 8.6-50 MG PO TABS
1.0000 | ORAL_TABLET | Freq: Every day | ORAL | Status: DC
  Administered 2024-04-20 – 2024-04-30 (×11): 1 via ORAL
  Filled 2024-04-20 (×12): qty 1

## 2024-04-20 MED ORDER — POLYETHYLENE GLYCOL 3350 17 G PO PACK: 17.0000 g | PACK | Freq: Every day | ORAL | Status: DC | PRN

## 2024-04-20 MED ORDER — FINASTERIDE 5 MG PO TABS
5.0000 mg | ORAL_TABLET | Freq: Every day | ORAL | Status: DC
  Administered 2024-04-20 – 2024-05-02 (×12): 5 mg via ORAL
  Filled 2024-04-20 (×13): qty 1

## 2024-04-20 MED ORDER — SODIUM CHLORIDE 0.9 % IV SOLN
2.0000 g | Freq: Three times a day (TID) | INTRAVENOUS | Status: DC
  Administered 2024-04-20 – 2024-04-23 (×9): 2 g via INTRAVENOUS
  Filled 2024-04-20 (×9): qty 12.5

## 2024-04-20 NOTE — Progress Notes (Signed)
 PROGRESS NOTE  Jose Patrick  DOB: 22-Dec-1950  PCP: [facility] [place] [id number]:[id number]  DOA: 04/19/2024  LOS: 1 day  Hospital Day: 2  Brief narrative: Jose Patrick is a 73 y.o. 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 He was hospitalized twice in last 2 months for recurrent pneumonia, last hospitalization 5/15 to 5/21, completed the course of antibiotics in the hospital. It seems patient did not really feel significant improvement even after that.  He was seen by his pulmonologist as an outpatient and given a course of Levaquin and prednisone .  Symptoms continue to worsen and hence he returned to ED on 6/2 with worsening dyspnea, productive cough, subjective fever, and hypoxia to 70s at home.  In the ED, patient was afebrile, hemodynamically stable, required 3 L oxygen by nasal cannula Labs showed WBC count of 9.1, BUN/creatinine 15/1.52, procalcitonin level not elevated  Respiratory virus panel positive for rhinovirus Chest x-ray showed continued diffuse bilateral airspace disease, R>L concerning for pneumonia.  Patient was started on IV cefepime, doxycycline Admitted to [facility] PCCM was consulted Noncontrast CT chest was obtained which showed - Bilateral pleural effusion R>L - Dense consolidation in the right lower lung which is progressing since last CT 3 weeks ago - Developing diffuse fine nodular infiltration throughout the left lung and the aerated portion of right lung - Underlying emphysematous changes in the lungs  Subjective: Patient was seen and examined this morning.  Very pleasant elderly African-American male.  Propped up in bed. Seems vibrant, energetic and interested to know about the details. We had a long conversation about his medical issue which.   No  family at bedside. Remains hemodynamically stable.  Was on BiPAP overnight, uses nocturnal CPAP at home On 4 L oxygen morning Most recent labs from this morning with WBC count normal at 8.7, procalcitonin level not elevated Potassium level elevated to 5.4, renal function back to normal  Assessment and plan: Acute on chronic respiratory failure with hypoxia  Abnormal CT chest Rhinovirus positive Patient has had multiple course of antibiotics for presumed pneumonia and was hospitalized twice in last 2 months.  Recently completed a course of Levaquin and prednisone  as an outpatient. Presented with worsening respite status, cough, subjective fever WBC count normal, procalcitonin level not elevated.  ESR and CRP elevated Chest x-ray and CT scan findings as above showing bilateral effusion, bilateral diffuse infiltrations and persistent RLL opacity.   More likely than an infectious etiology, patient could be having organizing pneumonia versus lymphangitic spread of cancer  PCCM consult appreciated.  Noted initiation of IV Solu-Medrol  40 mg daily. 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  I would keep other meds  on hold. Blood sugar likely to surge because of steroids.  Will monitor Continue Neurontin  Recent Labs  Lab 04/19/24 2255 04/20/24 0825  GLUCAP 185* 119*   HTN PTA meds-Toprol  100 mg daily,  on, losartan   CAD, HLD Continue Lipitor, Zetia , aspirin ,  Ranexa   GERD PPI  Morbid Obesity  Body mass index is 35.28 kg/m. Patient has been advised to make an attempt to improve diet and exercise patterns to aid in weight loss.  OSA  On nocturnal CPAP  Chronic constipation Scheduled and as needed bowel regimen  BPH Currently not on meds.  Reports significant LUTS.  I will start Proscar  PTSD/bipolar disorder Not on meds  Impaired mobility lumbar stenosis Lately limited in mobility because of dyspnea. Will benefit from PT  Goals of care   Code Status: Full Code     DVT prophylaxis: Lovenox  subcu    Antimicrobials: IV cefepime and doxycycline Fluid: None Consultants: PCCM Family Communication: None at bedside  Status: Inpatient Level of care:  Stepdown   Patient is from: Home Needs to continue in-hospital care: Needs IV antibiotics, further workup, may need bronchoscopy Anticipated d/c to: Pending clinical course    Diet:  Diet Order             Diet heart healthy/carb modified Room service appropriate? Yes; Fluid consistency: Thin  Diet effective now                   Scheduled Meds:  arformoterol   15 mcg Nebulization BID   aspirin   81 mg Oral Daily   atorvastatin   40 mg Oral QPM   budesonide  (PULMICORT ) nebulizer solution  0.5 mg Nebulization BID   Chlorhexidine Gluconate Cloth  6 each Topical Daily   cholecalciferol   1,000 Units Oral Daily   empagliflozin   25 mg Oral QHS   enoxaparin  (LOVENOX ) injection  50 mg Subcutaneous Q24H   ezetimibe   10 mg Oral Daily   ferrous sulfate   325 mg Oral BID WC   finasteride  5 mg Oral Daily   gabapentin   300 mg Oral BID   insulin  aspart  0-20 Units Subcutaneous TID WC   insulin  glargine-yfgn  25 Units Subcutaneous QHS   magnesium oxide  400 mg Oral Q breakfast   methylPREDNISolone  (SOLU-MEDROL ) injection  40 mg Intravenous Daily   metoprolol  succinate  100 mg Oral Daily   pantoprazole   40 mg Oral Daily   pyridoxine   100 mg Oral Daily   ranolazine   500 mg  Oral BID   revefenacin   175 mcg Nebulization Daily   senna-docusate  1 tablet Oral QHS    PRN meds: acetaminophen  **OR** acetaminophen , albuterol , benzonatate , chlorpheniramine-HYDROcodone, ondansetron  **OR** ondansetron  (ZOFRAN ) IV, polyethylene glycol   Infusions:   ceFEPime (MAXIPIME) IV Stopped (04/20/24 0320)   doxycycline (VIBRAMYCIN) IV 100 mg (04/20/24 1009)    Antimicrobials: Anti-infectives (From admission, onward)    Start     Dose/Rate Route Frequency Ordered Stop   04/20/24 0200  ceFEPIme (MAXIPIME) 2 g in sodium chloride  0.9 % 100 mL IVPB        2 g 200 mL/hr over 30 Minutes Intravenous Every 12 hours 04/19/24 1749     04/19/24 1330  ceFEPIme (MAXIPIME) 2 g in sodium chloride  0.9 % 100 mL IVPB        2 g 200 mL/hr over 30 Minutes Intravenous  Once 04/19/24 1319 04/19/24 1439   04/19/24 1330  doxycycline (VIBRAMYCIN) 100 mg in sodium chloride  0.9 % 250 mL IVPB  100 mg 125 mL/hr over 120 Minutes Intravenous Every 12 hours 04/19/24 1319         Objective: Vitals:   04/20/24 0749 04/20/24 0839  BP:    Pulse:  74  Resp:  19  Temp: 98 F (36.7 C)   SpO2:  96%    Intake/Output Summary (Last 24 hours) at 04/20/2024 1032 Last data filed at 04/20/2024 0332 Gross per 24 hour  Intake 1418.68 ml  Output 1800 ml  Net -381.32 ml   Filed Weights   04/19/24 1141  Weight: 105.2 kg   Weight change:  Body mass index is 35.28 kg/m.   Physical Exam: General exam: Pleasant, elderly African-American male Skin: No rashes, lesions or ulcers. HEENT: Atraumatic, normocephalic, no obvious bleeding Lungs: Cough significantly on deep breathing.  No wheezing CVS: S1, S2, no murmur,   GI/Abd: Soft, nontender, nondistended, bowel sound present,   CNS: Alert, awake, oriented x 3 Psychiatry: Mood appropriate,  Extremities: No pedal edema, no calf tenderness,   Data Review: I have personally reviewed the laboratory data and studies available.  F/u labs  ordered Unresulted Labs (From admission, onward)     Start     Ordered   04/19/24 1441  Expectorated Sputum Assessment w Gram Stain, Rflx to Resp Cult  (COPD / Pneumonia / Cellulitis / Lower Extremity Wound (Diabetic Foot Infection))  Once,   R        04/19/24 1442   04/19/24 1441  Legionella Pneumophila Serogp 1 Ur Ag  (COPD / Pneumonia / Cellulitis / Lower Extremity Wound (Diabetic Foot Infection))  Once,   R        04/19/24 1442   Unscheduled  Basic metabolic panel with GFR  Tomorrow morning,   R       Question:  Specimen collection method  Answer:  Lab=Lab collect   04/20/24 1032   Unscheduled  CBC with Differential/Platelet  Tomorrow morning,   R       Question:  Specimen collection method  Answer:  Lab=Lab collect   04/20/24 1032           Signed, Hoyt Macleod, MD [facility] 04/20/2024

## 2024-04-20 NOTE — TOC Initial Note (Signed)
 Transition of Care [facility]) - Initial/Assessment Note    Patient Details  Name: Jose Patrick MRN: [id number] Date of Birth: 30-May-1951  Transition of Care [facility]) CM/SW Contact:    Jonni Nettle, LCSW Phone Number: 04/20/2024, 11:11 AM  Clinical Narrative:                 CSW met with pt and wife, Jose Patrick, at bedside to discuss home oxygen and discharge planning. Pt reports he has home O2 through [employer]. Pt receives primary care through [facility]. Pt has O2 travel tank in room for discharge home. Pt denies any needs at this time. TOC will continue to follow for needs.   Expected Discharge Plan: Home/Self Care Barriers to Discharge: Continued Medical Work up   Patient Goals and CMS Choice Patient states their goals for this hospitalization and ongoing recovery are:: To return home    Expected Discharge Plan and Services In-house Referral: Clinical Social Work Discharge Planning Services: NA Post Acute Care Choice: NA Living arrangements for the past 2 months: Single Family Home                 DME Arranged: N/A DME Agency: NA       HH Arranged: NA HH Agency: NA        Prior Living Arrangements/Services Living arrangements for the past 2 months: Single Family Home Lives with:: Self, Spouse Patient language and need for interpreter reviewed:: Yes Do you feel safe going back to the place where you live?: Yes      Need for Family Participation in Patient Care: Yes (Comment) Care giver support system in place?: Yes (comment) Current home services: DME Criminal Activity/Legal Involvement Pertinent to Current Situation/Hospitalization: No - Comment as needed  Activities of Daily Living   ADL Screening (condition at time of admission) Independently performs ADLs?: Yes (appropriate for developmental age) Is the patient deaf or have difficulty hearing?: Yes Does the patient have difficulty seeing, even when wearing glasses/contacts?: No Does the  patient have difficulty concentrating, remembering, or making decisions?: No  Permission Sought/Granted Permission sought to share information with : Family Supports Permission granted to share information with : Yes, Verbal Permission Granted  Share Information with NAME: Sahmir Patrick  Permission granted to share info w AGENCY: [employer] [facility]  Permission granted to share info w Relationship: Spouse  Permission granted to share info w Contact Information: [phone]  Emotional Assessment Appearance:: Appears stated age Attitude/Demeanor/Rapport: Engaged Affect (typically observed): Accepting, Stable, Appropriate, Calm Orientation: : Oriented to Self, Oriented to Place, Oriented to  Time, Oriented to Situation Alcohol / Substance Use: Not Applicable Psych Involvement: No (comment)  Admission diagnosis:  Bilateral pneumonia [J18.9] Acute on chronic respiratory failure with hypoxia (HCC) [J96.21] Pneumonia due to infectious organism, unspecified laterality, unspecified part of lung [J18.9] Patient Active Problem List   Diagnosis Date Noted   Bilateral pneumonia 04/19/2024   Acute on chronic respiratory failure with hypoxia (HCC) 04/19/2024   Age-related cataract of both eyes 04/19/2024   Alcohol dependence, uncomplicated (HCC) 04/19/2024   Chronic kidney disease 04/19/2024   Class 2 obesity 04/19/2024   Chronic respiratory failure (HCC) 04/07/2024   Community acquired pneumonia 04/01/2024   Right lower lobe pulmonary nodule 01/26/2023   COPD with acute exacerbation (HCC) 01/26/2023   Hx of colonic polyps    Other dysphagia    Gastric erythema    Hypogonadism in male 04/02/2021   Pain involving joints of fingers of both  hands 04/02/2021   Carpal tunnel syndrome, bilateral upper limbs 04/02/2021   Dry eye syndrome of bilateral lacrimal glands 04/02/2021   Dyspnea, unspecified 04/02/2021   Encounter for follow-up examination after completed treatment for  malignant neoplasm 04/02/2021   Fractured dental restorative material without loss of material 04/02/2021   GERD (gastroesophageal reflux disease) 04/02/2021   Hormone replacement therapy 04/02/2021   Male erectile disorder (CODE) 04/02/2021   Nail dystrophy 04/02/2021   Observation and evaluation for other specified suspected conditions 04/02/2021   OSA (obstructive sleep apnea) 04/02/2021   Pain in joint involving ankle and foot 04/02/2021   Shoulder pain 04/02/2021   Pain in right hand 04/02/2021   Pain in right knee 04/02/2021   Rash and other nonspecific skin eruption 04/02/2021   Stiffness of right hand, not elsewhere classified 04/02/2021   Stress due to family tension 04/02/2021   Type 2 diabetes mellitus with diabetic neuropathy, unspecified (HCC) 04/02/2021   Vitamin B12 deficiency 04/02/2021   Vitamin D  deficiency 04/02/2021   Cervical radiculopathy due to degenerative joint disease of spine 08/23/2019   Abdominal bloating 05/10/2015   Colon polyps 05/10/2015   Rectal pain 05/10/2015   COPD with chronic bronchitis and emphysema (HCC) 01/22/2014   Schizoaffective disorder (HCC) 10/25/2013   S/P lobectomy of lung 01/15/2013   Bipolar affective disorder (HCC) 09/30/2012   PTSD (post-traumatic stress disorder) 09/30/2012   Non-small cell lung cancer (HCC) 01/09/2012   Preventative health care 03/31/2011   BENIGN PROSTATIC HYPERTROPHY 09/28/2010   Cough 05/10/2010   DEPRESSION, MAJOR 09/26/2009   Incontinence of feces 05/18/2009   Essential hypertension 03/20/2009   VERTIGO, INTERMITTENT 03/28/2008   Hyperlipidemia 10/08/2007   ERECTILE DYSFUNCTION 10/08/2007   Allergic rhinitis 10/08/2007   SPINAL STENOSIS, LUMBAR 10/08/2007   History of colonic polyps 10/08/2007   Anxiety state 08/10/2007   CONSTIPATION, CHRONIC, HX OF 08/10/2007   PCP:  Clinic, [name] Pharmacy:   [facility] - [place], [place] - [address] [place] [address] Phone: [phone] Fax: [phone]     Social Drivers of Health (SDOH) Social History: SDOH Screenings   Food Insecurity: No Food Insecurity (04/20/2024)  Housing: Low Risk  (04/20/2024)  Transportation Needs: No Transportation Needs (04/20/2024)  Utilities: Not At Risk (04/20/2024)  Depression (PHQ2-9): Low Risk  (11/20/2021)  Financial Resource Strain: Low Risk  (12/02/2023)   Received from [facility]  Physical Activity: Unknown (03/17/2024)   Received from [facility]  Social Connections: Moderately Integrated (04/20/2024)  Stress: Stress Concern Present (03/08/2024)   Received from [facility]  Tobacco Use: Medium Risk (04/19/2024)   SDOH Interventions: None indicated     Readmission Risk Interventions    04/20/2024   11:08 AM 04/02/2024    1:08 PM  Readmission Risk Prevention Plan  Transportation Screening Complete Complete  PCP or Specialist Appt within 5-7 Days  Complete  PCP or Specialist Appt within 3-5 Days Complete   Home Care Screening  Complete  Medication Review (RN CM)  Complete  HRI or Home Care Consult Complete   Social Work Consult for Recovery Care Planning/Counseling Complete   Palliative Care Screening Not Applicable   Medication Review (RN Care Manager) Complete     [place], MSW, LCSW 04/20/2024 11:14 AM

## 2024-04-20 NOTE — Progress Notes (Signed)
 NAME:  Jose Patrick, MRN:  [id number], DOB:  [birth year], LOS: 1 ADMISSION DATE:  04/19/2024, CONSULTATION DATE:  04/19/24 REFERRING MD:  Bonita Bussing , CHIEF COMPLAINT:  cough    History of Present Illness:  73 yo M PMH COPD, lung cancer, chronic hypoxic resp failure on 2L who presented to [facility] ED 6/2 for eval of persistent cough, suspected PNA. Pt states this has been ongoing x 3 mo. Significantly worse at night and when he is laying down. Endorses hx acid reflux, doesn't know if he still takes anything for this. Does not notice any coughing when he eats/drinks, no difficulty swallowing.  Cough gets so bad that he gets chest pain and dizziness. Previously cough was productive and orange, now generally non-productive, sometimes white and bubbly. Has had several abx courses. Last seen by pulm 5/15 in office at which time ED presentation was recommended for IV abx -- admitted 5/15-5/21, 5d zosyn  + steroids.  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 Started on doxy cefepime Admitted to [facility] PCCM consulted in this setting   No leukocytosis or fever BMP w Cr 1.5  LA WNL  Pertinent  Medical History  COPD Lung cancer Chronic hypoxic resp failure  OSA intolerant of CPAP  DM2  CAD HTN  GERD  Significant Hospital Events: Including procedures, antibiotic start and stop dates in addition to other pertinent events   6/2 [facility] admit PCCM consult   Interim History / Subjective:  PCT <0.1 CRP modestly elevated at 4 ESR up to 27   RVP  + rhino   CT chest w worse ASD vs prior CTA mid march + new nodules c/f infection vs inflammation. R>L effusions     Down to home O2  Cough a little better today   Objective    Blood pressure 133/71, pulse 71, temperature 98 F (36.7 C), temperature source Oral, resp. rate 18, height 5\' 8"  (1.727 m), weight 105.2 kg, SpO2 96%.    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 Pct is neg. Does have + rhinovirus but was previously negative and at that time he had significant ASD on CTA chest so dont think this would wholly explain our current CT findings. Pct argues against bacterial PNA. Could be organizing PNA, could be lymphangitic spread.  P -would de-escalate abx based on his PCT  -droplet  -cont steroids -follow expectorated sputum  -think he might ultimately need a bronch to better define what is going on. Not sure if this would be inpt or outpt - will talk w my attending and see what the best course is  -cont PPI (hx GERD, cough worse at night/reclining/laying down does make me wonder if this is contributing)  -PRN cough suppressant  -  IS, pulm hygiene -on home O2   Best Practice (right click and "Reselect all SmartList Selections" daily)   Per primary   Labs   CBC: Recent Labs  Lab 04/19/24 1144 04/20/24 0328  WBC 9.1 8.7  HGB 15.5 14.8  HCT 49.2 47.8  MCV 83.4 83.7  PLT 209 217    Basic Metabolic Panel: Recent Labs  Lab 04/19/24 1144 04/20/24 0328  NA 137 135  K 4.3 5.4*  CL 104 104  CO2 25 24  GLUCOSE 161* 158*  BUN 15 16  CREATININE 1.52* 1.09  CALCIUM  9.0 8.9   GFR: Estimated Creatinine Clearance: 72 mL/min (by C-G formula based on SCr of 1.09 mg/dL). Recent Labs  Lab 04/19/24 1144 04/19/24 1423 04/19/24 1753 04/19/24 1805 04/20/24 0328  PROCALCITON  --   --  <0.10  --  <0.10   WBC 9.1  --   --   --  8.7  LATICACIDVEN  --  1.2  --  1.3  --     Liver Function Tests: Recent Labs  Lab 04/20/24 0328  AST 18  ALT 27  ALKPHOS 47  BILITOT 0.6  PROT 5.7*  ALBUMIN 2.6*   No results for input(s): "LIPASE", "AMYLASE" in the last 168 hours. No results for input(s): "AMMONIA" in the last 168 hours.  ABG    Component Value Date/Time   TCO2 25 12/02/2023 1616     Coagulation Profile: Recent Labs  Lab 04/19/24 1415  INR 1.1    Cardiac Enzymes: No results for input(s): "CKTOTAL", "CKMB", "CKMBINDEX", "TROPONINI" in the last 168 hours.  HbA1C: Hgb A1c MFr Bld  Date/Time Value Ref Range Status  04/03/2024 03:52 AM 7.8 (H) 4.8 - 5.6 % Final    Comment:    (NOTE) Pre diabetes:          5.7%-6.4%  Diabetes:              >6.4%  Glycemic control for   <7.0% adults with diabetes   09/30/2012 10:31 AM 7.6 (H) 4.6 - 6.5 % Final    Comment:    Glycemic Control Guidelines for People with Diabetes:Non Diabetic:  <6%Goal of Therapy: <7%Additional Action Suggested:  >8%     CBG: Recent Labs  Lab 04/19/24 2255 04/20/24 0825  GLUCAP 185* 119*   Mod MDM    Eston Hence MSN, AGACNP-BC [place] Amion for pager 04/20/2024, 11:10 AM

## 2024-04-20 NOTE — Progress Notes (Signed)
   04/20/24 2312  BiPAP/CPAP/SIPAP  BiPAP/CPAP/SIPAP Pt Type Adult  BiPAP/CPAP/SIPAP SERVO  Mask Type Full face mask  Dentures removed? Yes - Placed in denture cup  Mask Size Large  Set Rate 15 breaths/min  Respiratory Rate 18 breaths/min  IPAP 10 cmH20  EPAP 5 cmH2O  FiO2 (%) 40 %  Flow Rate 0 lpm  Minute Ventilation 13  Leak 15  Peak Inspiratory Pressure (PIP) 9.1  Tidal Volume (Vt) 565  Patient Home Machine No  Patient Home Mask No  Patient Home Tubing No  Auto Titrate No  Press High Alarm 25 cmH2O  Press Low Alarm 5 cmH2O  Oxygen Percent 40 %

## 2024-04-20 NOTE — Progress Notes (Signed)
Chest x ray reviewed ,no pneumothorax.

## 2024-04-20 NOTE — Procedures (Signed)
 Thoracentesis  Procedure Note  Jose Patrick  [id number]  01-20-1951  Date:04/20/24  Time:11:50 AM   Provider Performing:Walsie Smeltz A Dionysios Massman   Procedure: Thoracentesis with imaging guidance ([id number])  Indication(s) Pleural Effusion  Consent Verbal consent  Anesthesia Topical only with 1% lidocaine     Time Out Verified patient identification, verified procedure, site/side was marked, verified correct patient position, special equipment/implants available, medications/allergies/relevant history reviewed, required imaging and test results available.   Sterile Technique Maximal sterile technique including full sterile barrier drape, hand hygiene, sterile gown, sterile gloves, mask, hair covering, sterile ultrasound probe cover (if used).  Procedure Description Ultrasound was used to identify appropriate pleural anatomy for placement and overlying skin marked.  Area of drainage cleaned and draped in sterile fashion. Lidocaine  was used to anesthetize the skin and subcutaneous tissue.  8 cc's of clear appearing fluid was drained from the right pleural space. Catheter then removed and bandaid applied to site.   Complications/Tolerance None; patient tolerated the procedure well. Chest X-ray is ordered to confirm no post-procedural complication.   EBL Minimal   Specimen(s) Pleural fluid   Very minimal amount of fluid aspirated  Cxr ordered

## 2024-04-20 NOTE — Progress Notes (Signed)
 PT Cancellation Note  Patient Details Name: Jose Patrick MRN: [id number] DOB: Sep 23, 1951   Cancelled Treatment:    Reason Eval/Treat Not Completed: Patient not medically ready (RN requested PT check back later, pt just had thoracentesis. Will follow.)   Daymon Evans PT 04/20/2024  Acute Rehabilitation Services  Office [phone]

## 2024-04-20 NOTE — Plan of Care (Signed)

## 2024-04-20 NOTE — Plan of Care (Signed)
   Problem: Education: Goal: Ability to describe self-care measures that may prevent or decrease complications (Diabetes Survival Skills Education) will improve Outcome: Progressing Goal: Individualized Educational Video(s) Outcome: Progressing   Problem: Coping: Goal: Ability to adjust to condition or change in health will improve Outcome: Progressing   Problem: Fluid Volume: Goal: Ability to maintain a balanced intake and output will improve Outcome: Progressing   Problem: Health Behavior/Discharge Planning: Goal: Ability to identify and utilize available resources and services will improve Outcome: Progressing Goal: Ability to manage health-related needs will improve Outcome: Progressing   Problem: Metabolic: Goal: Ability to maintain appropriate glucose levels will improve Outcome: Progressing   Problem: Nutritional: Goal: Maintenance of adequate nutrition will improve Outcome: Progressing Goal: Progress toward achieving an optimal weight will improve Outcome: Progressing   Problem: Skin Integrity: Goal: Risk for impaired skin integrity will decrease Outcome: Progressing   Problem: Tissue Perfusion: Goal: Adequacy of tissue perfusion will improve Outcome: Progressing   Problem: Activity: Goal: Ability to tolerate increased activity will improve Outcome: Progressing   Problem: Clinical Measurements: Goal: Ability to maintain a body temperature in the normal range will improve Outcome: Progressing   Problem: Respiratory: Goal: Ability to maintain adequate ventilation will improve Outcome: Progressing Goal: Ability to maintain a clear airway will improve Outcome: Progressing   Problem: Education: Goal: Knowledge of General Education information will improve Description: Including pain rating scale, medication(s)/side effects and non-pharmacologic comfort measures Outcome: Progressing   Problem: Health Behavior/Discharge Planning: Goal: Ability to manage  health-related needs will improve Outcome: Progressing   Problem: Clinical Measurements: Goal: Ability to maintain clinical measurements within normal limits will improve Outcome: Progressing Goal: Will remain free from infection Outcome: Progressing Goal: Diagnostic test results will improve Outcome: Progressing Goal: Respiratory complications will improve Outcome: Progressing Goal: Cardiovascular complication will be avoided Outcome: Progressing   Problem: Activity: Goal: Risk for activity intolerance will decrease Outcome: Progressing   Problem: Nutrition: Goal: Adequate nutrition will be maintained Outcome: Progressing   Problem: Coping: Goal: Level of anxiety will decrease Outcome: Progressing   Problem: Elimination: Goal: Will not experience complications related to bowel motility Outcome: Progressing Goal: Will not experience complications related to urinary retention Outcome: Progressing   Problem: Pain Managment: Goal: General experience of comfort will improve and/or be controlled Outcome: Progressing   Problem: Safety: Goal: Ability to remain free from injury will improve Outcome: Progressing   Problem: Skin Integrity: Goal: Risk for impaired skin integrity will decrease Outcome: Progressing

## 2024-04-21 ENCOUNTER — Telehealth: Payer: Self-pay | Admitting: Cardiology

## 2024-04-21 DIAGNOSIS — E1140 Type 2 diabetes mellitus with diabetic neuropathy, unspecified: Secondary | ICD-10-CM | POA: Diagnosis not present

## 2024-04-21 DIAGNOSIS — J9611 Chronic respiratory failure with hypoxia: Secondary | ICD-10-CM | POA: Diagnosis not present

## 2024-04-21 DIAGNOSIS — I10 Essential (primary) hypertension: Secondary | ICD-10-CM | POA: Diagnosis not present

## 2024-04-21 DIAGNOSIS — Z794 Long term (current) use of insulin: Secondary | ICD-10-CM

## 2024-04-21 DIAGNOSIS — J9621 Acute and chronic respiratory failure with hypoxia: Secondary | ICD-10-CM | POA: Diagnosis not present

## 2024-04-21 DIAGNOSIS — J189 Pneumonia, unspecified organism: Secondary | ICD-10-CM | POA: Diagnosis not present

## 2024-04-21 LAB — CBC WITH DIFFERENTIAL/PLATELET
Abs Immature Granulocytes: 0.08 10*3/uL — ABNORMAL HIGH (ref 0.00–0.07)
Basophils Absolute: 0 10*3/uL (ref 0.0–0.1)
Basophils Relative: 0 %
Eosinophils Absolute: 0.1 10*3/uL (ref 0.0–0.5)
Eosinophils Relative: 1 %
HCT: 46.9 % (ref 39.0–52.0)
Hemoglobin: 14.4 g/dL (ref 13.0–17.0)
Immature Granulocytes: 1 %
Lymphocytes Relative: 11 %
Lymphs Abs: 1.4 10*3/uL (ref 0.7–4.0)
MCH: 25.7 pg — ABNORMAL LOW (ref 26.0–34.0)
MCHC: 30.7 g/dL (ref 30.0–36.0)
MCV: 83.8 fL (ref 80.0–100.0)
Monocytes Absolute: 0.8 10*3/uL (ref 0.1–1.0)
Monocytes Relative: 6 %
Neutro Abs: 10.2 10*3/uL — ABNORMAL HIGH (ref 1.7–7.7)
Neutrophils Relative %: 81 %
Platelets: 232 10*3/uL (ref 150–400)
RBC: 5.6 MIL/uL (ref 4.22–5.81)
RDW: 15.5 % (ref 11.5–15.5)
WBC: 12.4 10*3/uL — ABNORMAL HIGH (ref 4.0–10.5)
nRBC: 0 % (ref 0.0–0.2)

## 2024-04-21 LAB — BASIC METABOLIC PANEL WITH GFR
Anion gap: 8 (ref 5–15)
BUN: 27 mg/dL — ABNORMAL HIGH (ref 8–23)
CO2: 27 mmol/L (ref 22–32)
Calcium: 8.9 mg/dL (ref 8.9–10.3)
Chloride: 100 mmol/L (ref 98–111)
Creatinine, Ser: 1.18 mg/dL (ref 0.61–1.24)
GFR, Estimated: >60 mL/min (ref >60)
Glucose, Bld: 157 mg/dL — ABNORMAL HIGH (ref 70–99)
Potassium: 4.4 mmol/L (ref 3.5–5.1)
Sodium: 135 mmol/L (ref 135–145)

## 2024-04-21 LAB — GLUCOSE, CAPILLARY
Glucose-Capillary: 149 mg/dL — ABNORMAL HIGH (ref 70–99)
Glucose-Capillary: 138 mg/dL — ABNORMAL HIGH (ref 70–99)
Glucose-Capillary: 285 mg/dL — ABNORMAL HIGH (ref 70–99)
Glucose-Capillary: 231 mg/dL — ABNORMAL HIGH (ref 70–99)

## 2024-04-21 NOTE — Hospital Course (Signed)
 Jose Patrick is a 73 y.o. male with a history of BPH, bipolar disorder, constipation, diabetes mellitus type 2, hyperlipidemia, PTSD, lumbar spinal stenosis.  Patient presented secondary to shortness of breath with evidence of recurrent pneumonia, rhinovirus positive, in addition to acute on chronic hypoxia. Pulmonology consulted.Aaron Aas

## 2024-04-21 NOTE — Progress Notes (Signed)
 Interval note:  I held the patient's aspirin , enoxaparin , Jardiance  in preparation for bronchoscopy.  Racheal Buddle, MD, PhD 04/21/2024, 5:00 PM [facility] [phone] or if no answer before 7:00PM call [phone] For any issues after 7:00PM please call eLink [phone]

## 2024-04-21 NOTE — Progress Notes (Signed)
 NAME:  Jose Patrick, MRN:  [id number], DOB:  1951-09-21, LOS: 2 ADMISSION DATE:  04/19/2024, CONSULTATION DATE:  04/19/24 REFERRING MD:  Bonita Bussing , CHIEF COMPLAINT:  cough    History of Present Illness:  73 yo M PMH COPD, lung cancer, chronic hypoxic resp failure on 2L who presented to [facility] ED 6/2 for eval of persistent cough, suspected PNA. Pt states this has been ongoing x 3 mo. Significantly worse at night and when he is laying down. Endorses hx acid reflux, doesn't know if he still takes anything for this. Does not notice any coughing when he eats/drinks, no difficulty swallowing.  Cough gets so bad that he gets chest pain and dizziness. Previously cough was productive and orange, now generally non-productive, sometimes white and bubbly. Has had several abx courses. Last seen by pulm 5/15 in office at which time ED presentation was recommended for IV abx -- admitted 5/15-5/21, 5d zosyn  + steroids.  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 Started on doxy cefepime Admitted to [facility] PCCM consulted in this setting   No leukocytosis or fever BMP w Cr 1.5  LA WNL  Pertinent  Medical History  COPD Lung cancer Chronic hypoxic resp failure  OSA intolerant of CPAP  DM2  CAD HTN  GERD  Significant Hospital Events: Including procedures, antibiotic start and stop dates in addition to other pertinent events   6/2 [facility] ED [facility] admit PCCM consult 6/3 right thoracentesis attempted, ECC obtained  Interim History / Subjective:   Attempted right thoracentesis 6/3, only 8 cc of clear fluid obtained Wore BiPAP overnight Nasal cannula currently  Objective    Blood pressure (!) 124/45, pulse 64, temperature (!) 97.5 F (36.4 C), temperature source Axillary, resp. rate 14, height 5\' 8"  (1.727 m), weight 105.2 kg, SpO2 97%.    FiO2 (%):  [40 %] 40 %   Intake/Output Summary (Last 24 hours) at 04/21/2024 4098 Last data filed at 04/21/2024 1191 Gross per 24 hour  Intake 1970.74 ml  Output 2225 ml  Net  -254.26 ml   Filed Weights   04/19/24 1141  Weight: 105.2 kg    Examination: General: Ill-appearing man, lying in bed, comfortable on current O2 3 L/min HENT: Oropharynx clear, scratchy voice with some hoarseness, no secretions Lungs: Rhonchi on the right on inspiration and expiration, left is distant but mostly clear Cardiovascular: Regular, no murmur Abdomen: Nondistended with positive bowel sounds Extremities: No edema Neuro: Awake, nonfocal  Resolved problem list   Assessment and Plan   Chronic hypoxic resp failure, multi lobar right-sided pulmonary infiltrates Cough Rhinovirus PNA  COPD OSA Lung ca (recurrent) s/p RLL rad in 2024 and prior LUL lobectomy in 2013 -CT's of the chest, labs reviewed. Pct is neg. Does have + rhinovirus but was previously negative.  Progressive pulmonary filtrates, now with right lower lobe and right middle lobe consolidation, involving infiltrate posterior segment right upper lobe.  Consider sequela from recurrent pneumonias, radiation pneumonitis versus scar, organizing pneumonia.  Clinical history and failure respond to therapy over the last several months would argue against bacterial pneumonia although he could have recurrent insults, aspiration, etc.  Finally, consider persistent cancer, lymphangitic spread  P -Currently covered with empiric antibiotics, cefepime and doxycycline although clinical history, procalcitonin less consistent with a bacterial pneumonia.  Follow culture data - Continued droplet precautions, rhinovirus is positive, could have precipitated this decompensation - Continue empiric corticosteroids at this time - Discussed the risk and benefits of bronchoscopy to  obtain culture data, tissue diagnosis with him today.  He is on 3 L/min, debilitated, does have some risk for extended mechanical ventilation.  Also explained risks of pneumothorax, bleeding, etc. - Continue cough suppression - Continue PPI - BiPAP nightly, good  compliance   Best Practice (right click and "Reselect all SmartList Selections" daily)   Per primary   Labs   CBC: Recent Labs  Lab 04/19/24 1144 04/20/24 0328 04/21/24 0334  WBC 9.1 8.7 12.4*  NEUTROABS  --   --  10.2*  HGB 15.5 14.8 14.4  HCT 49.2 47.8 46.9  MCV 83.4 83.7 83.8  PLT 209 217 232    Basic Metabolic Panel: Recent Labs  Lab 04/19/24 1144 04/20/24 0328 04/21/24 0334  NA 137 135 135  K 4.3 5.4* 4.4  CL 104 104 100  CO2 25 24 27   GLUCOSE 161* 158* 157*  BUN 15 16 27*  CREATININE 1.52* 1.09 1.18  CALCIUM  9.0 8.9 8.9   GFR: Estimated Creatinine Clearance: 66.5 mL/min (by C-G formula based on SCr of 1.18 mg/dL). Recent Labs  Lab 04/19/24 1144 04/19/24 1423 04/19/24 1753 04/19/24 1805 04/20/24 0328 04/21/24 0334  PROCALCITON  --   --  <0.10  --  <0.10  --   WBC 9.1  --   --   --  8.7 12.4*  LATICACIDVEN  --  1.2  --  1.3  --   --     Liver Function Tests: Recent Labs  Lab 04/20/24 0328  AST 18  ALT 27  ALKPHOS 47  BILITOT 0.6  PROT 5.7*  ALBUMIN 2.6*   No results for input(s): "LIPASE", "AMYLASE" in the last 168 hours. No results for input(s): "AMMONIA" in the last 168 hours.  ABG    Component Value Date/Time   TCO2 25 12/02/2023 1616     Coagulation Profile: Recent Labs  Lab 04/19/24 1415  INR 1.1    Cardiac Enzymes: No results for input(s): "CKTOTAL", "CKMB", "CKMBINDEX", "TROPONINI" in the last 168 hours.  HbA1C: Hgb A1c MFr Bld  Date/Time Value Ref Range Status  04/03/2024 03:52 AM 7.8 (H) 4.8 - 5.6 % Final    Comment:    (NOTE) Pre diabetes:          5.7%-6.4%  Diabetes:              >6.4%  Glycemic control for   <7.0% adults with diabetes   09/30/2012 10:31 AM 7.6 (H) 4.6 - 6.5 % Final    Comment:    Glycemic Control Guidelines for People with Diabetes:Non Diabetic:  <6%Goal of Therapy: <7%Additional Action Suggested:  >8%     CBG: Recent Labs  Lab 04/19/24 2255 04/20/24 0825 04/20/24 1215  04/20/24 1631 04/20/24 2110  GLUCAP 185* 119* 145* 180* 230*   Racheal Buddle, MD, PhD 04/21/2024, 7:38 AM [facility] [phone] or if no answer before 7:00PM call [phone] For any issues after 7:00PM please call eLink [phone]

## 2024-04-21 NOTE — Progress Notes (Signed)
 PROGRESS NOTE    Jose Patrick  ZOX:[id number] DOB: Jun 25, 1951 DOA: 04/19/2024 PCP: [name]   Brief Narrative: Jose Patrick is a 73 y.o. male with a history of BPH, bipolar disorder, constipation, diabetes mellitus type 2, hyperlipidemia, PTSD, lumbar spinal stenosis.  Patient presented secondary to shortness of breath with evidence of recurrent pneumonia, rhinovirus positive, in addition to acute on chronic hypoxia. Pulmonology consulted..   Assessment and Plan:  Acute on chronic respiratory failure with hypoxia Baseline 2 L/min of oxygen. Requiring up to 4 L/min this admission. CT evidence of right lower lung airspce disease in addition to developing diffuse nodular infiltration throught the left lung. Possible sequela from recurrent pneumonia vs radiation pneumonitis vs organizing pneumonia. Also concern for persistent cancer with lymphangitic spread. Pulmonology is consulted. Patient started empirically on steroids -Continue Solu-medrol  -Pulmonology recommendations: plan for bronchoscopy -Continue PPI -Continue doxycycline -Wean to baseline 2 L/min as able.  Rhinovirus pneumonia Noted. CT imaging suggesting pneumonia. -Continue supportive care  Bilateral pleural effusions Noted on CT imaging. Right greater than left.  Lung cancer, recurrent History of remote left upper lobe lobectomy and concern for right lower lobe lung cancer s/p recent radiation. Concern for lymphangitic spread.  Diabetes mellitus type 2 with neuropathy Diabetes is well controlled for age with hemoglobin A1C of 7.8%. Patient is on solumedrol for management of respiratory failure. -Continue Semglee  and SSI  Primary hypertension -Continue Toprol  XL  CAD Hyperlipidemia -Continue Lipitor, Zetia , aspirin  (held for bronchoscopy) and Ranexa  (held for bronchoscopy)  GERD -Continue Protonix   OSA -Continue CPAP at bedtime  Chronic constipation -Continue   BPH Patient with  lower urinary tract symptoms. Patient started on finasteride. -Continue finasteride  PTSD Bipolar disorder Noted. Not currently on medication therapy.  Lumbar stenosis Noted.  Morbid obesity, class II Estimated body mass index is 35.28 kg/m as calculated from the following:   Height as of this encounter: 5\' 8"  (1.727 m).   Weight as of this encounter: 105.2 kg.   DVT prophylaxis: Lovenox  (held for bronchoscopy) Code Status:   Code Status: Full Code Family Communication: None at bedside Disposition Plan: Discharge    Consultants:  Pulmonology  Procedures:  None  Antimicrobials: Doxycycline    Subjective: Patient states he is feeling worse today than yesterday. Still with significant dyspnea.  Objective: BP (!) 119/56   Pulse 70   Temp 98 F (36.7 C) (Oral)   Resp 19   Ht 5\' 8"  (1.727 m)   Wt 105.2 kg   SpO2 96%   BMI 35.28 kg/m   Examination:  General exam: Appears calm and comfortable. Currently receiving a breathing treatment. Respiratory system: Rhonchi. Respiratory effort normal. Cardiovascular system: S1 & S2 heard, RRR. No murmurs. Gastrointestinal system: Abdomen is nondistended, soft and nontender. Normal bowel sounds heard. Central nervous system: Alert and oriented. No focal neurological deficits. Psychiatry: Judgement and insight appear normal. Mood & affect appropriate.    Data Reviewed: I have personally reviewed following labs and imaging studies  CBC Lab Results  Component Value Date   WBC 12.4 (H) 04/21/2024   RBC 5.60 04/21/2024   HGB 14.4 04/21/2024   HCT 46.9 04/21/2024   MCV 83.8 04/21/2024   MCH 25.7 (L) 04/21/2024   PLT 232 04/21/2024   MCHC 30.7 04/21/2024   RDW 15.5 04/21/2024   LYMPHSABS 1.4 04/21/2024   MONOABS 0.8 04/21/2024   EOSABS 0.1 04/21/2024   BASOSABS 0.0 04/21/2024     Last metabolic panel Lab Results  Component Value Date   NA 135 04/21/2024   K 4.4 04/21/2024   CL 100 04/21/2024   CO2 27  04/21/2024   BUN 27 (H) 04/21/2024   CREATININE 1.18 04/21/2024   GLUCOSE 157 (H) 04/21/2024   GFRNONAA >60 04/21/2024   GFRAA  02/23/2009    >60        The eGFR has been calculated using the MDRD equation. This calculation has not been validated in all clinical situations. eGFR's persistently <60 mL/min signify possible Chronic Kidney Disease.   CALCIUM  8.9 04/21/2024   PROT 5.7 (L) 04/20/2024   ALBUMIN 2.6 (L) 04/20/2024   BILITOT 0.6 04/20/2024   ALKPHOS 47 04/20/2024   AST 18 04/20/2024   ALT 27 04/20/2024   ANIONGAP 8 04/21/2024    GFR: Estimated Creatinine Clearance: 66.5 mL/min (by C-G formula based on SCr of 1.18 mg/dL).  Recent Results (from the past 240 hours)  Resp panel by RT-PCR (RSV, Flu A&B, Covid) Anterior Nasal Swab     Status: None   Collection Time: 04/19/24  1:55 PM   Specimen: Anterior Nasal Swab  Result Value Ref Range Status   SARS Coronavirus 2 by RT PCR NEGATIVE NEGATIVE Final    Comment: (NOTE) SARS-CoV-2 target nucleic acids are NOT DETECTED.  The SARS-CoV-2 RNA is generally detectable in upper respiratory specimens during the acute phase of infection. The lowest concentration of SARS-CoV-2 viral copies this assay can detect is 138 copies/mL. A negative result does not preclude SARS-Cov-2 infection and should not be used as the sole basis for treatment or other patient management decisions. A negative result may occur with  improper specimen collection/handling, submission of specimen other than nasopharyngeal swab, presence of viral mutation(s) within the areas targeted by this assay, and inadequate number of viral copies(<138 copies/mL). A negative result must be combined with clinical observations, patient history, and epidemiological information. The expected result is Negative.  Fact Sheet for Patients:  BloggerCourse.com  Fact Sheet for Healthcare Providers:   SeriousBroker.it  This test is no t yet approved or cleared by the [place]  FDA and  has been authorized for detection and/or diagnosis of SARS-CoV-2 by FDA under an Emergency Use Authorization (EUA). This EUA will remain  in effect (meaning this test can be used) for the duration of the COVID-19 declaration under Section 564(b)(1) of the Act, 21 U.S.C.section 360bbb-3(b)(1), unless the authorization is terminated  or revoked sooner.       Influenza A by PCR NEGATIVE NEGATIVE Final   Influenza B by PCR NEGATIVE NEGATIVE Final    Comment: (NOTE) The Xpert Xpress SARS-CoV-2/FLU/RSV plus assay is intended as an aid in the diagnosis of influenza from Nasopharyngeal swab specimens and should not be used as a sole basis for treatment. Nasal washings and aspirates are unacceptable for Xpert Xpress SARS-CoV-2/FLU/RSV testing.  Fact Sheet for Patients: BloggerCourse.com  Fact Sheet for Healthcare Providers: SeriousBroker.it  This test is not yet approved or cleared by the [place]  FDA and has been authorized for detection and/or diagnosis of SARS-CoV-2 by FDA under an Emergency Use Authorization (EUA). This EUA will remain in effect (meaning this test can be used) for the duration of the COVID-19 declaration under Section 564(b)(1) of the Act, 21 U.S.C. section 360bbb-3(b)(1), unless the authorization is terminated or revoked.     Resp Syncytial Virus by PCR NEGATIVE NEGATIVE Final    Comment: (NOTE) Fact Sheet for Patients: BloggerCourse.com  Fact Sheet for Healthcare Providers: SeriousBroker.it  This test is not  yet approved or cleared by the [place]  FDA and has been authorized for detection and/or diagnosis of SARS-CoV-2 by FDA under an Emergency Use Authorization (EUA). This EUA will remain in effect (meaning this test can be used) for  the duration of the COVID-19 declaration under Section 564(b)(1) of the Act, 21 U.S.C. section 360bbb-3(b)(1), unless the authorization is terminated or revoked.  Performed at [facility], [address], [place], [place] [address]   Blood Culture (routine x 2)     Status: None (Preliminary result)   Collection Time: 04/19/24  2:04 PM   Specimen: BLOOD  Result Value Ref Range Status   Specimen Description   Final    BLOOD RIGHT ANTECUBITAL Performed at [facility], [address], [place], [place] [address]    Special Requests   Final    BOTTLES DRAWN AEROBIC AND ANAEROBIC Blood Culture results may not be optimal due to an inadequate volume of blood received in culture bottles Performed at [facility], [address] [place], [place] [address]    Culture   Final    NO GROWTH 2 DAYS Performed at [facility], [address] [place], [place] [address]    Report Status PENDING  Incomplete  Blood Culture (routine x 2)     Status: None (Preliminary result)   Collection Time: 04/19/24  2:26 PM   Specimen: BLOOD  Result Value Ref Range Status   Specimen Description   Final    BLOOD LEFT ANTECUBITAL Performed at [facility], [address] [place], [place] [address]    Special Requests   Final    BOTTLES DRAWN AEROBIC AND ANAEROBIC Blood Culture results may not be optimal due to an inadequate volume of blood received in culture bottles Performed at [facility], [address], [place], [place] [address]    Culture   Final    NO GROWTH 2 DAYS Performed at [facility], [address], [place], [place] [address]    Report Status PENDING  Incomplete  Respiratory (~20 pathogens) panel by PCR     Status: Abnormal   Collection Time: 04/19/24 11:09 PM   Specimen: Nasal Mucosa; Respiratory  Result Value Ref Range Status   Adenovirus NOT DETECTED NOT DETECTED Final    Coronavirus 229E NOT DETECTED NOT DETECTED Final    Comment: (NOTE) The Coronavirus on the Respiratory Panel, DOES NOT test for the novel  Coronavirus (2019 nCoV)    Coronavirus HKU1 NOT DETECTED NOT DETECTED Final   Coronavirus NL63 NOT DETECTED NOT DETECTED Final   Coronavirus OC43 NOT DETECTED NOT DETECTED Final   Metapneumovirus NOT DETECTED NOT DETECTED Final   Rhinovirus / Enterovirus DETECTED (A) NOT DETECTED Final   Influenza A NOT DETECTED NOT DETECTED Final   Influenza B NOT DETECTED NOT DETECTED Final   Parainfluenza Virus 1 NOT DETECTED NOT DETECTED Final   Parainfluenza Virus 2 NOT DETECTED NOT DETECTED Final   Parainfluenza Virus 3 NOT DETECTED NOT DETECTED Final   Parainfluenza Virus 4 NOT DETECTED NOT DETECTED Final   Respiratory Syncytial Virus NOT DETECTED NOT DETECTED Final   Bordetella pertussis NOT DETECTED NOT DETECTED Final   Bordetella Parapertussis NOT DETECTED NOT DETECTED Final   Chlamydophila pneumoniae NOT DETECTED NOT DETECTED Final   Mycoplasma pneumoniae NOT DETECTED NOT DETECTED Final    Comment: Performed at [facility], [address] [place], [place] [address]  MRSA Next  Gen by PCR, Nasal     Status: None   Collection Time: 04/19/24 11:09 PM   Specimen: Nasal Mucosa; Nasal Swab  Result Value Ref Range Status   MRSA by PCR Next Gen NOT DETECTED NOT DETECTED Final    Comment: (NOTE) The GeneXpert MRSA Assay (FDA approved for NASAL specimens only), is one component of a comprehensive MRSA colonization surveillance program. It is not intended to diagnose MRSA infection nor to guide or monitor treatment for MRSA infections. Test performance is not FDA approved in patients less than 15 years old. Performed at [facility], [address], [place], [place] [address]   Body fluid culture w Gram Stain     Status: None (Preliminary result)   Collection Time: 04/20/24 11:47 AM   Specimen: Pleural Fluid  Result Value Ref Range  Status   Specimen Description   Final    FLUID RIGHT PLEURAL Performed at [facility], [address], [place], [place] [address]    Special Requests   Final    NONE Performed at [facility], [address], [place], [place] [address]    Gram Stain   Final    ABUNDANT WBC PRESENT, PREDOMINANTLY MONONUCLEAR NO ORGANISMS SEEN    Culture   Final    NO GROWTH < 24 HOURS Performed at [facility], [address], [place], [place] [address]    Report Status PENDING  Incomplete      Radiology Studies: DG CHEST PORT 1 VIEW Result Date: 04/20/2024 CLINICAL DATA:  Status post thoracentesis. EXAM: PORTABLE CHEST 1 VIEW COMPARISON:  Radiograph in CT yesterday FINDINGS: No pneumothorax post thoracentesis. Slight decreased right basilar opacity, persistent airspace disease and likely pleural fluid persists. Underlying emphysema and bronchial thickening. Pulmonary nodules on CT are faintly visualized. Stable heart size and mediastinal contours. IMPRESSION: 1. No pneumothorax post thoracentesis. 2. Slight decreased right basilar opacity. Persistent right sided airspace disease and likely pleural fluid. Electronically Signed   By: Chadwick Colonel M.D.   On: 04/20/2024 15:10   CT CHEST WO CONTRAST Result Date: 04/19/2024 CLINICAL DATA:  Multifocal pneumonia. Worsening cough and shortness of breath over the past few days. Decreased oxygen saturation. EXAM: CT CHEST WITHOUT CONTRAST TECHNIQUE: Multidetector CT imaging of the chest was performed following the standard protocol without IV contrast. RADIATION DOSE REDUCTION: This exam was performed according to the departmental dose-optimization program which includes automated exposure control, adjustment of the mA and/or kV according to patient size and/or use of iterative reconstruction technique. COMPARISON:  Chest radiograph 04/19/2024.  CT chest 04/01/2024 FINDINGS: Cardiovascular: Normal heart size. Minimal  pericardial effusion. Normal caliber thoracic aorta. Calcification of the aorta and coronary arteries. Mediastinum/Nodes: Esophagus is decompressed. Thyroid gland is unremarkable. Mediastinal lymphadenopathy with largest pretracheal nodes measuring up to about 1.4 cm diameter. No change since prior study. This is nonspecific but likely to be reactive. Lungs/Pleura: Bilateral pleural effusions, greater on the right. Dense consolidation in the right lower lung with patchy airspace disease throughout the remainder the right lung. Numerous small nodules demonstrated throughout the left lung in the area of portion of the right lung which are new since prior study. Short interval time frame suggest that these are likely infectious or inflammatory. Changes most likely represent progressing pneumonia. This could also represent atypical pneumonia such as TB or fungal infection. Septic emboli would also be a possibility. Follow-up to resolution is recommended. Underlying emphysematous changes in the lungs. Upper Abdomen: No acute  abnormalities. Musculoskeletal: Degenerative changes in the spine. Postoperative change in the cervical spine. No acute bony abnormalities. IMPRESSION: 1. Bilateral pleural effusions, greater on the right. 2. Dense consolidation in the right lower lung with airspace disease throughout the remaining right lung. This is progressing since the prior study. 3. Developing diffuse fine nodular infiltration throughout the left lung in the aerated portion of the right lung. This is likely infectious or inflammatory. 4. Underlying emphysematous changes in the lungs. Electronically Signed   By: Boyce Byes M.D.   On: 04/19/2024 18:25      LOS: 2 days    Aneita Keens, MD [facility] 04/21/2024, 5:07 PM   If 7PM-7AM, please contact night-coverage [email or web address]

## 2024-04-21 NOTE — Telephone Encounter (Signed)
 Thank you :)

## 2024-04-21 NOTE — Telephone Encounter (Signed)
 Pt would like his Doctor to know he's in the hospital and requesting a c/b from the nurse

## 2024-04-21 NOTE — Telephone Encounter (Signed)
 Called patient back. Patient stated he is in the hospital and he wants Dr. Emmette Harms to know he is in ICU and she needs to know what is going on. Will send message to Dr. Emmette Harms so she is aware.

## 2024-04-21 NOTE — Progress Notes (Signed)
 PT Cancellation Note  Patient Details Name: Jose Patrick MRN: [id number] DOB: 1951-02-20   Cancelled Treatment:    Reason Eval/Treat Not Completed: Other (comment). Another care provided present. PT to return as schedule allows. PT to continue to follow acutely.   Cary Clarks, PT Acute Rehab   Annalee Kiang 04/21/2024, 1:28 PM

## 2024-04-21 NOTE — Telephone Encounter (Signed)
 Pt was in-patient when this was scheduled.  Sending to Dr. Baldwin Levee.

## 2024-04-22 ENCOUNTER — Encounter (HOSPITAL_COMMUNITY)
Admission: EM | Disposition: A | Discharge status: Home Health | Payer: Self-pay | Source: Home / Self Care | Attending: Family Medicine

## 2024-04-22 ENCOUNTER — Inpatient Hospital Stay (HOSPITAL_COMMUNITY)

## 2024-04-22 ENCOUNTER — Inpatient Hospital Stay (HOSPITAL_COMMUNITY): Admitting: Anesthesiology

## 2024-04-22 ENCOUNTER — Encounter (HOSPITAL_COMMUNITY): Payer: Self-pay | Admitting: *Deleted

## 2024-04-22 DIAGNOSIS — E1140 Type 2 diabetes mellitus with diabetic neuropathy, unspecified: Secondary | ICD-10-CM | POA: Diagnosis not present

## 2024-04-22 DIAGNOSIS — J449 Chronic obstructive pulmonary disease, unspecified: Secondary | ICD-10-CM

## 2024-04-22 DIAGNOSIS — J189 Pneumonia, unspecified organism: Secondary | ICD-10-CM | POA: Diagnosis not present

## 2024-04-22 DIAGNOSIS — E119 Type 2 diabetes mellitus without complications: Secondary | ICD-10-CM | POA: Diagnosis not present

## 2024-04-22 DIAGNOSIS — J9611 Chronic respiratory failure with hypoxia: Secondary | ICD-10-CM | POA: Diagnosis not present

## 2024-04-22 DIAGNOSIS — R918 Other nonspecific abnormal finding of lung field: Secondary | ICD-10-CM

## 2024-04-22 DIAGNOSIS — I10 Essential (primary) hypertension: Secondary | ICD-10-CM | POA: Diagnosis not present

## 2024-04-22 DIAGNOSIS — J9621 Acute and chronic respiratory failure with hypoxia: Secondary | ICD-10-CM | POA: Diagnosis not present

## 2024-04-22 HISTORY — PX: VIDEO BRONCHOSCOPY: SHX5072

## 2024-04-22 LAB — GLUCOSE, CAPILLARY
Glucose-Capillary: 81 mg/dL (ref 70–99)
Glucose-Capillary: 104 mg/dL — ABNORMAL HIGH (ref 70–99)
Glucose-Capillary: 177 mg/dL — ABNORMAL HIGH (ref 70–99)
Glucose-Capillary: 257 mg/dL — ABNORMAL HIGH (ref 70–99)

## 2024-04-22 SURGERY — BRONCHOSCOPY, WITH FLUOROSCOPY
Anesthesia: General | Laterality: Right

## 2024-04-22 MED ORDER — CHLORHEXIDINE GLUCONATE 0.12 % MT SOLN
OROMUCOSAL | Status: AC
Start: 2024-04-22 — End: ?
  Filled 2024-04-22: qty 15

## 2024-04-22 MED ORDER — PROPOFOL 10 MG/ML IV BOLUS
INTRAVENOUS | Status: AC
Start: 2024-04-22 — End: ?
  Filled 2024-04-22: qty 20

## 2024-04-22 MED ORDER — ROCURONIUM BROMIDE 10 MG/ML (PF) SYRINGE
PREFILLED_SYRINGE | INTRAVENOUS | Status: DC | PRN
  Administered 2024-04-22: 70 mg via INTRAVENOUS

## 2024-04-22 MED ORDER — GUAIFENESIN ER 600 MG PO TB12
1200.0000 mg | ORAL_TABLET | Freq: Two times a day (BID) | ORAL | Status: DC
  Administered 2024-04-22 – 2024-05-02 (×18): 1200 mg via ORAL
  Filled 2024-04-22 (×20): qty 2

## 2024-04-22 MED ORDER — FENTANYL CITRATE (PF) 100 MCG/2ML IJ SOLN
INTRAMUSCULAR | Status: DC | PRN
  Administered 2024-04-22 (×2): 50 ug via INTRAVENOUS

## 2024-04-22 MED ORDER — INSULIN ASPART 100 UNIT/ML IJ SOLN
0.0000 [IU] | Freq: Three times a day (TID) | INTRAMUSCULAR | Status: DC
  Administered 2024-04-22: 11 [IU] via SUBCUTANEOUS
  Administered 2024-04-23: 15 [IU] via SUBCUTANEOUS
  Administered 2024-04-23: 20 [IU] via SUBCUTANEOUS
  Administered 2024-04-23 (×2): 7 [IU] via SUBCUTANEOUS
  Administered 2024-04-24: 20 [IU] via SUBCUTANEOUS
  Administered 2024-04-24: 3 [IU] via SUBCUTANEOUS
  Administered 2024-04-24: 20 [IU] via SUBCUTANEOUS
  Administered 2024-04-24 – 2024-04-25 (×2): 7 [IU] via SUBCUTANEOUS
  Administered 2024-04-25 (×2): 15 [IU] via SUBCUTANEOUS
  Administered 2024-04-25: 11 [IU] via SUBCUTANEOUS
  Administered 2024-04-26: 4 [IU] via SUBCUTANEOUS

## 2024-04-22 MED ORDER — CHLORHEXIDINE GLUCONATE 0.12 % MT SOLN
15.0000 mL | OROMUCOSAL | Status: AC
  Administered 2024-04-22: 15 mL via OROMUCOSAL

## 2024-04-22 MED ORDER — FENTANYL CITRATE (PF) 100 MCG/2ML IJ SOLN
INTRAMUSCULAR | Status: AC
Start: 2024-04-22 — End: ?
  Filled 2024-04-22: qty 2

## 2024-04-22 MED ORDER — ONDANSETRON HCL 4 MG/2ML IJ SOLN
INTRAMUSCULAR | Status: DC | PRN
  Administered 2024-04-22: 4 mg via INTRAVENOUS

## 2024-04-22 MED ORDER — LACTATED RINGERS IV SOLN: INTRAVENOUS | Status: DC | PRN

## 2024-04-22 MED ORDER — LIDOCAINE 2% (20 MG/ML) 5 ML SYRINGE
INTRAMUSCULAR | Status: DC | PRN
  Administered 2024-04-22: 100 mg via INTRAVENOUS

## 2024-04-22 MED ORDER — SUGAMMADEX SODIUM 200 MG/2ML IV SOLN
INTRAVENOUS | Status: DC | PRN
  Administered 2024-04-22: 200 mg via INTRAVENOUS

## 2024-04-22 MED ORDER — PROPOFOL 10 MG/ML IV BOLUS
INTRAVENOUS | Status: DC | PRN
  Administered 2024-04-22: 120 mg via INTRAVENOUS

## 2024-04-22 NOTE — Progress Notes (Signed)
 PROGRESS NOTE    Jose Patrick  MWN:[id number] DOB: 07/14/1951 DOA: 04/19/2024 PCP: [name]   Brief Narrative: Jose Patrick is a 73 y.o. male with a history of BPH, bipolar disorder, constipation, diabetes mellitus type 2, hyperlipidemia, PTSD, lumbar spinal stenosis.  Patient presented secondary to shortness of breath with evidence of recurrent pneumonia, rhinovirus positive, in addition to acute on chronic hypoxia. Pulmonology consulted..   Assessment and Plan:  Acute on chronic respiratory failure with hypoxia Baseline 2 L/min of oxygen. Requiring up to 4 L/min this admission. CT evidence of right lower lung airspce disease in addition to developing diffuse nodular infiltration throught the left lung. Possible sequela from recurrent pneumonia vs radiation pneumonitis vs organizing pneumonia. Also concern for persistent cancer with lymphangitic spread. Pulmonology is consulted. Patient started empirically on steroids -Continue Solu-medrol  -Pulmonology recommendations: plan for bronchoscopy today -Continue PPI -Continue doxycycline -Wean to baseline 2 L/min as able.  Rhinovirus pneumonia Noted. CT imaging suggesting pneumonia. -Continue supportive care  Bilateral pleural effusions Noted on CT imaging. Right greater than left.  Lung cancer, recurrent History of remote left upper lobe lobectomy and concern for right lower lobe lung cancer s/p recent radiation. Concern for lymphangitic spread.  Diabetes mellitus type 2 with neuropathy Diabetes is well controlled for age with hemoglobin A1C of 7.8%. Patient is on solumedrol for management of respiratory failure. -Continue Semglee  and SSI  Primary hypertension -Continue Toprol  XL  CAD Hyperlipidemia -Continue Lipitor, Zetia , aspirin  (held for bronchoscopy) and Ranexa  (held for bronchoscopy)  GERD -Continue Protonix   OSA -Continue CPAP at bedtime  Chronic constipation -Continue   BPH Patient  with lower urinary tract symptoms. Patient started on finasteride. -Continue finasteride  PTSD Bipolar disorder Noted. Not currently on medication therapy.  Lumbar stenosis Noted.  Morbid obesity, class II Estimated body mass index is 35.28 kg/m as calculated from the following:   Height as of this encounter: 5\' 8"  (1.727 m).   Weight as of this encounter: 105.2 kg.   DVT prophylaxis: Lovenox  (held for bronchoscopy) Code Status:   Code Status: Full Code Family Communication: Friend at bedside Disposition Plan: Discharge home pending continued specialist recommendations and improvement of functional status.   Consultants:  Pulmonology  Procedures:  None  Antimicrobials: Doxycycline    Subjective: Patient feeling much better today. Some blood tinged sputum production.  Objective: BP 139/65   Pulse 73   Temp 97.7 F (36.5 C) (Oral)   Resp 14   Ht 5\' 8"  (1.727 m)   Wt 105.2 kg   SpO2 95%   BMI 35.28 kg/m   Examination:  General exam: Appears calm and comfortable Respiratory system: Clear/diminished to auscultation. Respiratory effort normal. Cardiovascular system: S1 & S2 heard, RRR. Gastrointestinal system: Abdomen is nondistended, soft and nontender. Normal bowel sounds heard. Central nervous system: Alert and oriented. No focal neurological deficits. Psychiatry: Judgement and insight appear normal. Mood & affect appropriate.    Data Reviewed: I have personally reviewed following labs and imaging studies  CBC Lab Results  Component Value Date   WBC 12.4 (H) 04/21/2024   RBC 5.60 04/21/2024   HGB 14.4 04/21/2024   HCT 46.9 04/21/2024   MCV 83.8 04/21/2024   MCH 25.7 (L) 04/21/2024   PLT 232 04/21/2024   MCHC 30.7 04/21/2024   RDW 15.5 04/21/2024   LYMPHSABS 1.4 04/21/2024   MONOABS 0.8 04/21/2024   EOSABS 0.1 04/21/2024   BASOSABS 0.0 04/21/2024     Last metabolic panel Lab Results  Component Value Date   NA 135 04/21/2024   K 4.4  04/21/2024   CL 100 04/21/2024   CO2 27 04/21/2024   BUN 27 (H) 04/21/2024   CREATININE 1.18 04/21/2024   GLUCOSE 157 (H) 04/21/2024   GFRNONAA >60 04/21/2024   GFRAA  02/23/2009    >60        The eGFR has been calculated using the MDRD equation. This calculation has not been validated in all clinical situations. eGFR's persistently <60 mL/min signify possible Chronic Kidney Disease.   CALCIUM  8.9 04/21/2024   PROT 5.7 (L) 04/20/2024   ALBUMIN 2.6 (L) 04/20/2024   BILITOT 0.6 04/20/2024   ALKPHOS 47 04/20/2024   AST 18 04/20/2024   ALT 27 04/20/2024   ANIONGAP 8 04/21/2024    GFR: Estimated Creatinine Clearance: 66.5 mL/min (by C-G formula based on SCr of 1.18 mg/dL).  Recent Results (from the past 240 hours)  Resp panel by RT-PCR (RSV, Flu A&B, Covid) Anterior Nasal Swab     Status: None   Collection Time: 04/19/24  1:55 PM   Specimen: Anterior Nasal Swab  Result Value Ref Range Status   SARS Coronavirus 2 by RT PCR NEGATIVE NEGATIVE Final    Comment: (NOTE) SARS-CoV-2 target nucleic acids are NOT DETECTED.  The SARS-CoV-2 RNA is generally detectable in upper respiratory specimens during the acute phase of infection. The lowest concentration of SARS-CoV-2 viral copies this assay can detect is 138 copies/mL. A negative result does not preclude SARS-Cov-2 infection and should not be used as the sole basis for treatment or other patient management decisions. A negative result may occur with  improper specimen collection/handling, submission of specimen other than nasopharyngeal swab, presence of viral mutation(s) within the areas targeted by this assay, and inadequate number of viral copies(<138 copies/mL). A negative result must be combined with clinical observations, patient history, and epidemiological information. The expected result is Negative.  Fact Sheet for Patients:  BloggerCourse.com  Fact Sheet for Healthcare Providers:   SeriousBroker.it  This test is no t yet approved or cleared by the [place]  FDA and  has been authorized for detection and/or diagnosis of SARS-CoV-2 by FDA under an Emergency Use Authorization (EUA). This EUA will remain  in effect (meaning this test can be used) for the duration of the COVID-19 declaration under Section 564(b)(1) of the Act, 21 U.S.C.section 360bbb-3(b)(1), unless the authorization is terminated  or revoked sooner.       Influenza A by PCR NEGATIVE NEGATIVE Final   Influenza B by PCR NEGATIVE NEGATIVE Final    Comment: (NOTE) The Xpert Xpress SARS-CoV-2/FLU/RSV plus assay is intended as an aid in the diagnosis of influenza from Nasopharyngeal swab specimens and should not be used as a sole basis for treatment. Nasal washings and aspirates are unacceptable for Xpert Xpress SARS-CoV-2/FLU/RSV testing.  Fact Sheet for Patients: BloggerCourse.com  Fact Sheet for Healthcare Providers: SeriousBroker.it  This test is not yet approved or cleared by the [place]  FDA and has been authorized for detection and/or diagnosis of SARS-CoV-2 by FDA under an Emergency Use Authorization (EUA). This EUA will remain in effect (meaning this test can be used) for the duration of the COVID-19 declaration under Section 564(b)(1) of the Act, 21 U.S.C. section 360bbb-3(b)(1), unless the authorization is terminated or revoked.     Resp Syncytial Virus by PCR NEGATIVE NEGATIVE Final    Comment: (NOTE) Fact Sheet for Patients: BloggerCourse.com  Fact Sheet for Healthcare Providers: SeriousBroker.it  This test is not  yet approved or cleared by the [place]  FDA and has been authorized for detection and/or diagnosis of SARS-CoV-2 by FDA under an Emergency Use Authorization (EUA). This EUA will remain in effect (meaning this test can be used) for  the duration of the COVID-19 declaration under Section 564(b)(1) of the Act, 21 U.S.C. section 360bbb-3(b)(1), unless the authorization is terminated or revoked.  Performed at [facility], [address] [place], [place] [address]   Blood Culture (routine x 2)     Status: None (Preliminary result)   Collection Time: 04/19/24  2:04 PM   Specimen: BLOOD  Result Value Ref Range Status   Specimen Description   Final    BLOOD RIGHT ANTECUBITAL Performed at [facility], [address], [place], [place] [address]    Special Requests   Final    BOTTLES DRAWN AEROBIC AND ANAEROBIC Blood Culture results may not be optimal due to an inadequate volume of blood received in culture bottles Performed at [facility], [address], [place], [place] [address]    Culture   Final    NO GROWTH 2 DAYS Performed at [facility], [address], [place], [place] [address]    Report Status PENDING  Incomplete  Blood Culture (routine x 2)     Status: None (Preliminary result)   Collection Time: 04/19/24  2:26 PM   Specimen: BLOOD  Result Value Ref Range Status   Specimen Description   Final    BLOOD LEFT ANTECUBITAL Performed at [facility], [address] [place], [place] [address]    Special Requests   Final    BOTTLES DRAWN AEROBIC AND ANAEROBIC Blood Culture results may not be optimal due to an inadequate volume of blood received in culture bottles Performed at [facility], [address] [place], [place] [address]    Culture   Final    NO GROWTH 2 DAYS Performed at [facility], [address] [place], [place] [address]    Report Status PENDING  Incomplete  Respiratory (~20 pathogens) panel by PCR     Status: Abnormal   Collection Time: 04/19/24 11:09 PM   Specimen: Nasal Mucosa; Respiratory  Result Value Ref Range Status   Adenovirus NOT DETECTED NOT DETECTED Final    Coronavirus 229E NOT DETECTED NOT DETECTED Final    Comment: (NOTE) The Coronavirus on the Respiratory Panel, DOES NOT test for the novel  Coronavirus (2019 nCoV)    Coronavirus HKU1 NOT DETECTED NOT DETECTED Final   Coronavirus NL63 NOT DETECTED NOT DETECTED Final   Coronavirus OC43 NOT DETECTED NOT DETECTED Final   Metapneumovirus NOT DETECTED NOT DETECTED Final   Rhinovirus / Enterovirus DETECTED (A) NOT DETECTED Final   Influenza A NOT DETECTED NOT DETECTED Final   Influenza B NOT DETECTED NOT DETECTED Final   Parainfluenza Virus 1 NOT DETECTED NOT DETECTED Final   Parainfluenza Virus 2 NOT DETECTED NOT DETECTED Final   Parainfluenza Virus 3 NOT DETECTED NOT DETECTED Final   Parainfluenza Virus 4 NOT DETECTED NOT DETECTED Final   Respiratory Syncytial Virus NOT DETECTED NOT DETECTED Final   Bordetella pertussis NOT DETECTED NOT DETECTED Final   Bordetella Parapertussis NOT DETECTED NOT DETECTED Final   Chlamydophila pneumoniae NOT DETECTED NOT DETECTED Final   Mycoplasma pneumoniae NOT DETECTED NOT DETECTED Final    Comment: Performed at [facility], [address], [place], [place] [address]  MRSA Next  Gen by PCR, Nasal     Status: None   Collection Time: 04/19/24 11:09 PM   Specimen: Nasal Mucosa; Nasal Swab  Result Value Ref Range Status   MRSA by PCR Next Gen NOT DETECTED NOT DETECTED Final    Comment: (NOTE) The GeneXpert MRSA Assay (FDA approved for NASAL specimens only), is one component of a comprehensive MRSA colonization surveillance program. It is not intended to diagnose MRSA infection nor to guide or monitor treatment for MRSA infections. Test performance is not FDA approved in patients less than 6 years old. Performed at [facility], [address], [place], [place] [address]   Body fluid culture w Gram Stain     Status: None (Preliminary result)   Collection Time: 04/20/24 11:47 AM   Specimen: Pleural Fluid  Result Value Ref Range  Status   Specimen Description   Final    FLUID RIGHT PLEURAL Performed at [facility], [address] [place], [place] [address]    Special Requests   Final    NONE Performed at [facility], [address] [place], [place] [address]    Gram Stain   Final    ABUNDANT WBC PRESENT, PREDOMINANTLY MONONUCLEAR NO ORGANISMS SEEN    Culture   Final    NO GROWTH < 24 HOURS Performed at [facility], [address] [place], [place] [address]    Report Status PENDING  Incomplete      Radiology Studies: DG CHEST PORT 1 VIEW Result Date: 04/20/2024 CLINICAL DATA:  Status post thoracentesis. EXAM: PORTABLE CHEST 1 VIEW COMPARISON:  Radiograph in CT yesterday FINDINGS: No pneumothorax post thoracentesis. Slight decreased right basilar opacity, persistent airspace disease and likely pleural fluid persists. Underlying emphysema and bronchial thickening. Pulmonary nodules on CT are faintly visualized. Stable heart size and mediastinal contours. IMPRESSION: 1. No pneumothorax post thoracentesis. 2. Slight decreased right basilar opacity. Persistent right sided airspace disease and likely pleural fluid. Electronically Signed   By: Chadwick Colonel M.D.   On: 04/20/2024 15:10      LOS: 3 days    Aneita Keens, MD [facility] 04/22/2024, 10:43 AM   If 7PM-7AM, please contact night-coverage [email or web address]

## 2024-04-22 NOTE — Anesthesia Postprocedure Evaluation (Signed)
 Anesthesia Post Note  Patient: Jose Patrick  Procedure(s) Performed: BRONCHOSCOPY, WITH FLUOROSCOPY (Right)     Patient location during evaluation: PACU Anesthesia Type: General Level of consciousness: awake and alert Pain management: pain level controlled Vital Signs Assessment: post-procedure vital signs reviewed and stable Respiratory status: spontaneous breathing, nonlabored ventilation, respiratory function stable and patient connected to nasal cannula oxygen Cardiovascular status: blood pressure returned to baseline and stable Postop Assessment: no apparent nausea or vomiting Anesthetic complications: no   No notable events documented.  Last Vitals:  Vitals:   04/22/24 1350 04/22/24 1355  BP: 113/82   Pulse: 89 91  Resp: (!) 22 (!) 29  Temp:    SpO2: 95% 98%    Last Pain:  Vitals:   04/22/24 1335  TempSrc:   PainSc: 0-No pain                 Leslye Rast

## 2024-04-22 NOTE — Op Note (Signed)
 Video Bronchoscopy Procedure Note  Date of Operation: 04/22/2024  Pre-op Diagnosis: History of lung cancer, multi lobar right-sided infiltrates  Post-op Diagnosis: Same  Surgeon: Racheal Buddle  Assistants: none  Anesthesia: General anesthesia  Operation: Flexible video fiberoptic bronchoscopy and biopsies.  Estimated Blood Loss: 15-20 cc  Complications: none noted  Indications and History: Jose Patrick is 73 y.o. with history of lung cancer treated with a left upper lobectomy and then right lower lobe radiation.  He has progressive right lower lobe right middle lobe and now right upper lobe infiltrates over the last several months.  Treated as pneumonia but unclear cause.  Recommendation was to perform video fiberoptic bronchoscopy with biopsies. The risks, benefits, complications, treatment options and expected outcomes were discussed with the patient.  The possibilities of pneumothorax, pneumonia, reaction to medication, pulmonary aspiration, perforation of a viscus, bleeding, failure to diagnose a condition and creating a complication requiring transfusion or operation were discussed with the patient who freely signed the consent.    Description of Procedure: The patient was seen in the Preoperative Area, was examined and was deemed appropriate to proceed.  The patient was taken to [facility] endoscopy room 4, identified as [name] and the procedure verified as Flexible Video Fiberoptic Bronchoscopy.  A Time Out was held and the above information confirmed.   General anesthesia was initiated and the patient was endotracheally intubated the video fiberoptic bronchoscope was introduced via the ET tube and a general inspection was performed which showed normal trachea, normal main carina.  The left upper lobe airway was surgically absent with an intact suture line.  The left lower lobe airways were widely patent.  The right sided airways were quite edematous, narrowed  throughout but without any endobronchial lesion.  Under fluoroscopic guidance transbronchial brushings were performed in the right middle lobe and right lower lobe to be sent for cytology.  Under fluoroscopic guidance transbronchial forceps biopsies were performed in the right middle lobe and right lower lobe for cytology.  A bronchoalveolar lavage was performed in the posterior segment of the right upper lobe to be sent for cytology and microbiology.  There was some initial moderate bleeding that stopped quickly. The patient tolerated the procedure well. The bronchoscope was removed. There were no obvious complications.   Samples: 1.  Transbronchial brushings from right middle lobe 2.  Transbronchial brushings from right lower lobe 3.  Transbronchial forceps biopsies from the right middle lobe 4.  Transbronchial forceps biopsies from the right lower lobe 5.  Bronchoalveolar lavage from the posterior segment of the right upper lobe  Plans:  Postprocedural chest x-ray is ordered and pending.  He will move back to his regular hospital bed once recovered from general anesthesia.  We will review the cytology, pathology and microbiology results with the patient when they become available.     Racheal Buddle, MD, PhD 04/22/2024, 12:35 PM [facility] [phone] or if no answer [phone]

## 2024-04-22 NOTE — Anesthesia Procedure Notes (Signed)
 Procedure Name: Intubation Date/Time: 04/22/2024 11:56 AM  Performed by: Chelsie Burel D, CRNAPre-anesthesia Checklist: Patient identified, Emergency Drugs available, Suction available and Patient being monitored Patient Re-evaluated:Patient Re-evaluated prior to induction Oxygen Delivery Method: Circle system utilized Preoxygenation: Pre-oxygenation with 100% oxygen Induction Type: IV induction Ventilation: Mask ventilation without difficulty Laryngoscope Size: Mac and 4 Grade View: Grade III Tube type: Oral Tube size: 8.0 mm Number of attempts: 1 Airway Equipment and Method: Stylet and Oral airway Placement Confirmation: ETT inserted through vocal cords under direct vision, positive ETCO2 and breath sounds checked- equal and bilateral Secured at: 22 cm Tube secured with: Tape Dental Injury: Teeth and Oropharynx as per pre-operative assessment

## 2024-04-22 NOTE — Progress Notes (Signed)
 Arrival time 1255

## 2024-04-22 NOTE — Progress Notes (Signed)
 NAME:  Jose Patrick, MRN:  [id number], DOB:  [birth year], LOS: 3 ADMISSION DATE:  04/19/2024, CONSULTATION DATE:  04/19/24 REFERRING MD:  Bonita Bussing , CHIEF COMPLAINT:  cough    History of Present Illness:  73 yo M PMH COPD, lung cancer, chronic hypoxic resp failure on 2L who presented to [facility] ED 6/2 for eval of persistent cough, suspected PNA. Pt states this has been ongoing x 3 mo. Significantly worse at night and when he is laying down. Endorses hx acid reflux, doesn't know if he still takes anything for this. Does not notice any coughing when he eats/drinks, no difficulty swallowing.  Cough gets so bad that he gets chest pain and dizziness. Previously cough was productive and orange, now generally non-productive, sometimes white and bubbly. Has had several abx courses. Last seen by pulm 5/15 in office at which time ED presentation was recommended for IV abx -- admitted 5/15-5/21, 5d zosyn  + steroids.  In [facility] ED 6/2, CXR w R>L ASD  on [facility]. Started on doxy cefepime Admitted to [facility] PCCM consulted in this setting   No leukocytosis or fever BMP w Cr 1.5  LA WNL  Pertinent  Medical History  COPD Lung cancer Chronic hypoxic resp failure  OSA intolerant of CPAP  DM2  CAD HTN  GERD  Significant Hospital Events: Including procedures, antibiotic start and stop dates in addition to other pertinent events   6/2 [facility] ED [facility] admit PCCM consult 6/3 right thoracentesis attempted, ECC obtained  Interim History / Subjective:   Started having some minimal hemoptysis overnight. Mixed with sputum.  4L [place] with sat 98% No other complaints   Objective    Blood pressure (!) 115/31, pulse (!) 58, temperature (!) 97.2 F (36.2 C), temperature source Oral, resp. rate 13, height 5\' 8"  (1.727 m), weight 105.2 kg, SpO2 98%.        Intake/Output Summary (Last 24 hours) at 04/22/2024 0835 Last data filed at 04/22/2024 0600 Gross per 24 hour  Intake 794.58 ml  Output 2500 ml  Net -1705.42 ml   Filed  Weights   04/19/24 1141  Weight: 105.2 kg    Examination:  General:Adult male in NAD HENT: [place]/AT, PERRL, no JVD Lungs: Coarse crackles bilaterally worse on the right.  Cardiovascular: RRR, no MRG Abdomen: Soft, ND, ND Extremities: No acute deformity or ROM limitation.  Neuro: Awake, nonfocal   Resolved problem list   Assessment and Plan   Chronic hypoxic resp failure, multi lobar right-sided pulmonary infiltrates Cough Rhinovirus PNA  COPD OSA Hemoptysis- seems to be very low volume mixed with sputum.  Lung ca (recurrent) s/p RLL rad in 2024 and prior LUL lobectomy in 2013 -CT's of the chest, labs reviewed. Pct is neg. Does have + rhinovirus but was previously negative.  Progressive pulmonary filtrates prior to rhinovirus and has failed multiple courses of antibiotics. Significant airspace disease in the right lung. Concern for recurrent aspiration, scar, radiation pneumonitis vs lymphangitic spread.   P - Currently covered with empiric antibiotics, cefepime and doxycycline.  - Continued droplet precautions, rhinovirus is positive, could have precipitated this decompensation - Continue empiric corticosteroids at this time. - Again discussed the risk and benefits of bronchoscopy to obtain culture data, tissue diagnosis with him today.  He is on 4 L/min, debilitated, does have some risk for extended mechanical ventilation.  Also explained risks of pneumothorax, bleeding, etc. Planned for today at 1100. - Follow BAL, biopsy - Continue cough suppression - Continue PPI - BiPAP nightly  Best Practice (right click and "Reselect all SmartList Selections" daily)   Per primary   Labs   CBC: Recent Labs  Lab 04/19/24 1144 04/20/24 0328 04/21/24 0334  WBC 9.1 8.7 12.4*  NEUTROABS  --   --  10.2*  HGB 15.5 14.8 14.4  HCT 49.2 47.8 46.9  MCV 83.4 83.7 83.8  PLT 209 217 232    Basic Metabolic Panel: Recent Labs  Lab 04/19/24 1144 04/20/24 0328 04/21/24 0334  NA 137  135 135  K 4.3 5.4* 4.4  CL 104 104 100  CO2 25 24 27   GLUCOSE 161* 158* 157*  BUN 15 16 27*  CREATININE 1.52* 1.09 1.18  CALCIUM  9.0 8.9 8.9   GFR: Estimated Creatinine Clearance: 66.5 mL/min (by C-G formula based on SCr of 1.18 mg/dL). Recent Labs  Lab 04/19/24 1144 04/19/24 1423 04/19/24 1753 04/19/24 1805 04/20/24 0328 04/21/24 0334  PROCALCITON  --   --  <0.10  --  <0.10  --   WBC 9.1  --   --   --  8.7 12.4*  LATICACIDVEN  --  1.2  --  1.3  --   --     Liver Function Tests: Recent Labs  Lab 04/20/24 0328  AST 18  ALT 27  ALKPHOS 47  BILITOT 0.6  PROT 5.7*  ALBUMIN 2.6*   No results for input(s): "LIPASE", "AMYLASE" in the last 168 hours. No results for input(s): "AMMONIA" in the last 168 hours.  ABG    Component Value Date/Time   TCO2 25 12/02/2023 1616     Coagulation Profile: Recent Labs  Lab 04/19/24 1415  INR 1.1    Cardiac Enzymes: No results for input(s): "CKTOTAL", "CKMB", "CKMBINDEX", "TROPONINI" in the last 168 hours.  HbA1C: Hgb A1c MFr Bld  Date/Time Value Ref Range Status  04/03/2024 03:52 AM 7.8 (H) 4.8 - 5.6 % Final    Comment:    (NOTE) Pre diabetes:          5.7%-6.4%  Diabetes:              >6.4%  Glycemic control for   <7.0% adults with diabetes   09/30/2012 10:31 AM 7.6 (H) 4.6 - 6.5 % Final    Comment:    Glycemic Control Guidelines for People with Diabetes:Non Diabetic:  <6%Goal of Therapy: <7%Additional Action Suggested:  >8%     CBG: Recent Labs  Lab 04/21/24 0814 04/21/24 1131 04/21/24 1624 04/21/24 2238 04/22/24 0744  GLUCAP 149* 138* 285* 231* 81     Roz Cornelia, AGACNP-BC [facility]  See Amion for personal pager PCCM on call pager [phone] until 7pm. Please call Elink 7p-7a. [phone]  04/22/2024 8:56 AM

## 2024-04-22 NOTE — Transfer of Care (Signed)
 Immediate Anesthesia Transfer of Care Note  Patient: Jose Patrick  Procedure(s) Performed: BRONCHOSCOPY, WITH FLUOROSCOPY (Right)  Patient Location: PACU  Anesthesia Type:General  Level of Consciousness: awake, alert , and oriented  Airway & Oxygen Therapy: Patient Spontanous Breathing and Patient connected to face mask oxygen  Post-op Assessment: Report given to RN and Post -op Vital signs reviewed and stable  Post vital signs: Reviewed and stable  Last Vitals:  Vitals Value Taken Time  BP    Temp    Pulse 108 04/22/24 1255  Resp 28 04/22/24 1255  SpO2 91 % 04/22/24 1255    Last Pain:  Vitals:   04/22/24 1015  TempSrc: Oral  PainSc: 0-No pain         Complications: No notable events documented.

## 2024-04-22 NOTE — Progress Notes (Addendum)
 PT Cancellation Note  Patient Details Name: Rhyker Silversmith MRN: [id number] DOB: 1951/09/06   Cancelled Treatment:    Reason Eval/Treat Not Completed: Patient at procedure or test/unavailable (bronch)  1525: checked back with pt upon return from bronch and he declined to participate in PT at this time.   Myna Asal Payson 04/22/2024, 12:20 PM Blanch Bunde, DPT Physical Therapist Acute Rehabilitation Services Office: [phone]

## 2024-04-22 NOTE — Anesthesia Preprocedure Evaluation (Addendum)
 Anesthesia Evaluation  Patient identified by MRN, date of birth, ID band Patient awake    Reviewed: Allergy & Precautions, NPO status , Patient's Chart, lab work & pertinent test results, reviewed documented beta blocker date and time   History of Anesthesia Complications (+) PROLONGED EMERGENCE and history of anesthetic complications  Airway Mallampati: III  TM Distance: >3 FB     Dental no notable dental hx.    Pulmonary sleep apnea , pneumonia, unresolved, COPD,  COPD inhaler and oxygen dependent, former smoker Currently on 5L [place]   breath sounds clear to auscultation       Cardiovascular hypertension, (-) CAD, (-) Past MI and (-) Cardiac Stents  Rhythm:Regular Rate:Normal  IMPRESSIONS     1. Left ventricular ejection fraction, by estimation, is 55 to 60%. The  left ventricle has normal function. The left ventricle has no regional  wall motion abnormalities. There is mild concentric left ventricular  hypertrophy. Left ventricular diastolic  parameters are consistent with Grade I diastolic dysfunction (impaired  relaxation).   2. Right ventricular systolic function is mildly reduced. The right  ventricular size is normal. Tricuspid regurgitation signal is inadequate  for assessing PA pressure.   3. The mitral valve is normal in structure. No evidence of mitral valve  regurgitation. No evidence of mitral stenosis.   4. The aortic valve is tricuspid. Aortic valve regurgitation is not  visualized. No aortic stenosis is present.   5. The inferior vena cava is normal in size with greater than 50%  respiratory variability, suggesting right atrial pressure of 3 mmHg.   6. Frequent PVCs noted.      Neuro/Psych neg Seizures PSYCHIATRIC DISORDERS Anxiety Depression Bipolar Disorder Schizophrenia   Neuromuscular disease    GI/Hepatic ,GERD  ,,(+) neg Cirrhosis        Endo/Other  diabetes, Type 2    Renal/GU CRFRenal disease      Musculoskeletal   Abdominal   Peds  Hematology   Anesthesia Other Findings   Reproductive/Obstetrics                              Anesthesia Physical Anesthesia Plan  ASA: 3  Anesthesia Plan: General   Post-op Pain Management:    Induction: Intravenous  PONV Risk Score and Plan: 2 and Ondansetron  and Dexamethasone  Airway Management Planned: Oral ETT  Additional Equipment:   Intra-op Plan:   Post-operative Plan: Extubation in OR  Informed Consent: I have reviewed the patients History and Physical, chart, labs and discussed the procedure including the risks, benefits and alternatives for the proposed anesthesia with the patient or authorized representative who has indicated his/her understanding and acceptance.     Dental advisory given  Plan Discussed with: CRNA  Anesthesia Plan Comments:        Anesthesia Quick Evaluation

## 2024-04-23 DIAGNOSIS — J189 Pneumonia, unspecified organism: Secondary | ICD-10-CM | POA: Diagnosis not present

## 2024-04-23 DIAGNOSIS — J9611 Chronic respiratory failure with hypoxia: Secondary | ICD-10-CM | POA: Diagnosis not present

## 2024-04-23 DIAGNOSIS — J9621 Acute and chronic respiratory failure with hypoxia: Secondary | ICD-10-CM | POA: Diagnosis not present

## 2024-04-23 DIAGNOSIS — I10 Essential (primary) hypertension: Secondary | ICD-10-CM | POA: Diagnosis not present

## 2024-04-23 DIAGNOSIS — E1140 Type 2 diabetes mellitus with diabetic neuropathy, unspecified: Secondary | ICD-10-CM | POA: Diagnosis not present

## 2024-04-23 LAB — CBC
HCT: 48.7 % (ref 39.0–52.0)
Hemoglobin: 14.9 g/dL (ref 13.0–17.0)
MCH: 26.3 pg (ref 26.0–34.0)
MCHC: 30.6 g/dL (ref 30.0–36.0)
MCV: 86 fL (ref 80.0–100.0)
Platelets: 221 10*3/uL (ref 150–400)
RBC: 5.66 MIL/uL (ref 4.22–5.81)
RDW: 15.5 % (ref 11.5–15.5)
WBC: 10 10*3/uL (ref 4.0–10.5)
nRBC: 0 % (ref 0.0–0.2)

## 2024-04-23 LAB — BASIC METABOLIC PANEL WITH GFR
Anion gap: 9 (ref 5–15)
BUN: 24 mg/dL — ABNORMAL HIGH (ref 8–23)
CO2: 23 mmol/L (ref 22–32)
Calcium: 9.2 mg/dL (ref 8.9–10.3)
Chloride: 102 mmol/L (ref 98–111)
Creatinine, Ser: 1.03 mg/dL (ref 0.61–1.24)
GFR, Estimated: >60 mL/min (ref >60)
Glucose, Bld: 370 mg/dL — ABNORMAL HIGH (ref 70–99)
Potassium: 4.5 mmol/L (ref 3.5–5.1)
Sodium: 134 mmol/L — ABNORMAL LOW (ref 135–145)

## 2024-04-23 LAB — GLUCOSE, CAPILLARY
Glucose-Capillary: 248 mg/dL — ABNORMAL HIGH (ref 70–99)
Glucose-Capillary: 231 mg/dL — ABNORMAL HIGH (ref 70–99)
Glucose-Capillary: 385 mg/dL — ABNORMAL HIGH (ref 70–99)
Glucose-Capillary: 345 mg/dL — ABNORMAL HIGH (ref 70–99)

## 2024-04-23 LAB — CHOLESTEROL, BODY FLUID: Cholesterol, Fluid: 27 mg/dL

## 2024-04-23 LAB — ACID FAST SMEAR (AFB, MYCOBACTERIA): Acid Fast Smear: NEGATIVE

## 2024-04-23 LAB — LEGIONELLA PNEUMOPHILA SEROGP 1 UR AG: L. pneumophila Serogp 1 Ur Ag: NEGATIVE

## 2024-04-23 LAB — BODY FLUID CULTURE W GRAM STAIN

## 2024-04-23 MED ORDER — POLYETHYLENE GLYCOL 3350 17 G PO PACK
17.0000 g | PACK | Freq: Every day | ORAL | Status: DC
  Administered 2024-04-23 – 2024-05-02 (×9): 17 g via ORAL
  Filled 2024-04-23 (×11): qty 1

## 2024-04-23 MED ORDER — PREDNISONE 20 MG PO TABS
40.0000 mg | ORAL_TABLET | Freq: Every day | ORAL | Status: DC
  Administered 2024-04-23 – 2024-04-24 (×2): 40 mg via ORAL
  Filled 2024-04-23 (×2): qty 2

## 2024-04-23 NOTE — Plan of Care (Signed)

## 2024-04-23 NOTE — Plan of Care (Signed)
 Ambulated with PT in hallway and to bathroom.  Performed peri care and oral care independently, states he feels like he's breathing much better and the swelling has gone down in his legs some.

## 2024-04-23 NOTE — Progress Notes (Signed)
 PROGRESS NOTE    Jose Patrick  WGN:[id number] DOB: [birth year] DOA: 04/19/2024 PCP: [name]   Brief Narrative: Jose Patrick is a 73 y.o. male with a history of BPH, bipolar disorder, constipation, diabetes mellitus type 2, hyperlipidemia, PTSD, lumbar spinal stenosis.  Patient presented secondary to shortness of breath with evidence of recurrent pneumonia, rhinovirus positive, in addition to acute on chronic hypoxia. Pulmonology consulted. Bronchoscopy performed on 6/5.   Assessment and Plan:  Acute on chronic respiratory failure with hypoxia Baseline 2 L/min of oxygen. Requiring up to 4 L/min this admission. CT evidence of right lower lung airspce disease in addition to developing diffuse nodular infiltration throught the left lung. Possible sequela from recurrent pneumonia vs radiation pneumonitis vs organizing pneumonia. Also concern for persistent cancer with lymphangitic spread. Pulmonology is consulted. Patient started empirically on steroids, Cefepime and doxycycline. Bronchoscopy performed on 6/5, which does not suggest infection; pathology pending. Weaned to 3 L/min -Discontinue Solu-medrol  and start prednisone  -Pulmonology recommendations: transition to prednisone , discontinue antibiotics, follow-up culture data and cytology -Continue PPI -Wean to baseline 2 L/min as able. -Discontinue Cefepime and doxycycline  Rhinovirus pneumonia Noted. CT imaging suggesting pneumonia. -Continue supportive care  Bilateral pleural effusions Noted on CT imaging. Right greater than left.  Lung cancer, recurrent History of remote left upper lobe lobectomy and concern for right lower lobe lung cancer s/p recent radiation. Concern for lymphangitic spread.  Diabetes mellitus type 2 with neuropathy Diabetes is well controlled for age with hemoglobin A1C of 7.8%. Patient is on solumedrol for management of respiratory failure. -Continue Semglee  and SSI  Primary  hypertension -Continue Toprol  XL  CAD Hyperlipidemia -Continue Lipitor, Zetia , aspirin  (held for bronchoscopy) and Ranexa  (held for bronchoscopy)  GERD -Continue Protonix   OSA -Continue CPAP at bedtime  Chronic constipation -Continue MiraLAX   BPH Patient with lower urinary tract symptoms. Patient started on finasteride. -Continue finasteride  PTSD Bipolar disorder Noted. Not currently on medication therapy.  Lumbar stenosis Noted.  Morbid obesity, class II Estimated body mass index is 30 kg/m as calculated from the following:   Height as of this encounter: 5\' 8"  (1.727 m).   Weight as of this encounter: 89.5 kg.   DVT prophylaxis: Lovenox  (held for bronchoscopy) Code Status:   Code Status: Full Code Family Communication: None at bedside Disposition Plan: Discharge home pending continued improvement of functional status and PT/OT recommendations   Consultants:  Pulmonology  Procedures:  None  Antimicrobials: Doxycycline  Cefepime   Subjective: Continues to feel well. No issues overnight. Was unable to work with PT yesterday because of his bronchoscopy.  Objective: BP 132/62   Pulse 73   Temp 98.7 F (37.1 C) (Oral)   Resp 16   Ht 5\' 8"  (1.727 m)   Wt 89.5 kg   SpO2 94%   BMI 30.00 kg/m   Examination:  General exam: Appears calm and comfortable Respiratory system: Mild wheezing/rhonchi. Respiratory effort normal. Cardiovascular system: S1 & S2 heard, RRR. No murmurs, rubs, gallops or clicks. Gastrointestinal system: Abdomen is nondistended, soft and nontender. Normal bowel sounds heard. Central nervous system: Alert and oriented. No focal neurological deficits. Musculoskeletal: No edema. No calf tenderness Psychiatry: Judgement and insight appear normal. Mood & affect appropriate.    Data Reviewed: I have personally reviewed following labs and imaging studies  CBC Lab Results  Component Value Date   WBC 12.4 (H) 04/21/2024   RBC 5.60  04/21/2024   HGB 14.4 04/21/2024   HCT 46.9 04/21/2024  MCV 83.8 04/21/2024   MCH 25.7 (L) 04/21/2024   PLT 232 04/21/2024   MCHC 30.7 04/21/2024   RDW 15.5 04/21/2024   LYMPHSABS 1.4 04/21/2024   MONOABS 0.8 04/21/2024   EOSABS 0.1 04/21/2024   BASOSABS 0.0 04/21/2024     Last metabolic panel Lab Results  Component Value Date   NA 135 04/21/2024   K 4.4 04/21/2024   CL 100 04/21/2024   CO2 27 04/21/2024   BUN 27 (H) 04/21/2024   CREATININE 1.18 04/21/2024   GLUCOSE 157 (H) 04/21/2024   GFRNONAA >60 04/21/2024   GFRAA  02/23/2009    >60        The eGFR has been calculated using the MDRD equation. This calculation has not been validated in all clinical situations. eGFR's persistently <60 mL/min signify possible Chronic Kidney Disease.   CALCIUM  8.9 04/21/2024   PROT 5.7 (L) 04/20/2024   ALBUMIN 2.6 (L) 04/20/2024   BILITOT 0.6 04/20/2024   ALKPHOS 47 04/20/2024   AST 18 04/20/2024   ALT 27 04/20/2024   ANIONGAP 8 04/21/2024    GFR: Estimated Creatinine Clearance: 61.5 mL/min (by C-G formula based on SCr of 1.18 mg/dL).  Recent Results (from the past 240 hours)  Resp panel by RT-PCR (RSV, Flu A&B, Covid) Anterior Nasal Swab     Status: None   Collection Time: 04/19/24  1:55 PM   Specimen: Anterior Nasal Swab  Result Value Ref Range Status   SARS Coronavirus 2 by RT PCR NEGATIVE NEGATIVE Final    Comment: (NOTE) SARS-CoV-2 target nucleic acids are NOT DETECTED.  The SARS-CoV-2 RNA is generally detectable in upper respiratory specimens during the acute phase of infection. The lowest concentration of SARS-CoV-2 viral copies this assay can detect is 138 copies/mL. A negative result does not preclude SARS-Cov-2 infection and should not be used as the sole basis for treatment or other patient management decisions. A negative result may occur with  improper specimen collection/handling, submission of specimen other than nasopharyngeal swab, presence of viral  mutation(s) within the areas targeted by this assay, and inadequate number of viral copies(<138 copies/mL). A negative result must be combined with clinical observations, patient history, and epidemiological information. The expected result is Negative.  Fact Sheet for Patients:  BloggerCourse.com  Fact Sheet for Healthcare Providers:  SeriousBroker.it  This test is no t yet approved or cleared by the [place]  FDA and  has been authorized for detection and/or diagnosis of SARS-CoV-2 by FDA under an Emergency Use Authorization (EUA). This EUA will remain  in effect (meaning this test can be used) for the duration of the COVID-19 declaration under Section 564(b)(1) of the Act, 21 U.S.C.section 360bbb-3(b)(1), unless the authorization is terminated  or revoked sooner.       Influenza A by PCR NEGATIVE NEGATIVE Final   Influenza B by PCR NEGATIVE NEGATIVE Final    Comment: (NOTE) The Xpert Xpress SARS-CoV-2/FLU/RSV plus assay is intended as an aid in the diagnosis of influenza from Nasopharyngeal swab specimens and should not be used as a sole basis for treatment. Nasal washings and aspirates are unacceptable for Xpert Xpress SARS-CoV-2/FLU/RSV testing.  Fact Sheet for Patients: BloggerCourse.com  Fact Sheet for Healthcare Providers: SeriousBroker.it  This test is not yet approved or cleared by the [place]  FDA and has been authorized for detection and/or diagnosis of SARS-CoV-2 by FDA under an Emergency Use Authorization (EUA). This EUA will remain in effect (meaning this test can be used) for the duration of the  COVID-19 declaration under Section 564(b)(1) of the Act, 21 U.S.C. section 360bbb-3(b)(1), unless the authorization is terminated or revoked.     Resp Syncytial Virus by PCR NEGATIVE NEGATIVE Final    Comment: (NOTE) Fact Sheet for  Patients: BloggerCourse.com  Fact Sheet for Healthcare Providers: SeriousBroker.it  This test is not yet approved or cleared by the [place]  FDA and has been authorized for detection and/or diagnosis of SARS-CoV-2 by FDA under an Emergency Use Authorization (EUA). This EUA will remain in effect (meaning this test can be used) for the duration of the COVID-19 declaration under Section 564(b)(1) of the Act, 21 U.S.C. section 360bbb-3(b)(1), unless the authorization is terminated or revoked.  Performed at [facility], [address], [place], [place] [address]   Blood Culture (routine x 2)     Status: None (Preliminary result)   Collection Time: 04/19/24  2:04 PM   Specimen: BLOOD  Result Value Ref Range Status   Specimen Description   Final    BLOOD RIGHT ANTECUBITAL Performed at [facility], [address], [place], [place] [address]    Special Requests   Final    BOTTLES DRAWN AEROBIC AND ANAEROBIC Blood Culture results may not be optimal due to an inadequate volume of blood received in culture bottles Performed at [facility], [address] [place], [place] [address]    Culture   Final    NO GROWTH 3 DAYS Performed at [facility], [address], [place], [place] [address]    Report Status PENDING  Incomplete  Blood Culture (routine x 2)     Status: None (Preliminary result)   Collection Time: 04/19/24  2:26 PM   Specimen: BLOOD  Result Value Ref Range Status   Specimen Description   Final    BLOOD LEFT ANTECUBITAL Performed at [facility], [address] [place], [place] [address]    Special Requests   Final    BOTTLES DRAWN AEROBIC AND ANAEROBIC Blood Culture results may not be optimal due to an inadequate volume of blood received in culture bottles Performed at [facility], [address], [place],  [place] [address]    Culture   Final    NO GROWTH 3 DAYS Performed at [facility], [address] [place], [place] [address]    Report Status PENDING  Incomplete  Respiratory (~20 pathogens) panel by PCR     Status: Abnormal   Collection Time: 04/19/24 11:09 PM   Specimen: Nasal Mucosa; Respiratory  Result Value Ref Range Status   Adenovirus NOT DETECTED NOT DETECTED Final   Coronavirus 229E NOT DETECTED NOT DETECTED Final    Comment: (NOTE) The Coronavirus on the Respiratory Panel, DOES NOT test for the novel  Coronavirus (2019 nCoV)    Coronavirus HKU1 NOT DETECTED NOT DETECTED Final   Coronavirus NL63 NOT DETECTED NOT DETECTED Final   Coronavirus OC43 NOT DETECTED NOT DETECTED Final   Metapneumovirus NOT DETECTED NOT DETECTED Final   Rhinovirus / Enterovirus DETECTED (A) NOT DETECTED Final   Influenza A NOT DETECTED NOT DETECTED Final   Influenza B NOT DETECTED NOT DETECTED Final   Parainfluenza Virus 1 NOT DETECTED NOT DETECTED Final   Parainfluenza Virus 2 NOT DETECTED NOT DETECTED Final   Parainfluenza Virus 3 NOT DETECTED NOT DETECTED Final   Parainfluenza Virus 4 NOT DETECTED NOT DETECTED Final   Respiratory Syncytial Virus NOT DETECTED NOT DETECTED Final   Bordetella  pertussis NOT DETECTED NOT DETECTED Final   Bordetella Parapertussis NOT DETECTED NOT DETECTED Final   Chlamydophila pneumoniae NOT DETECTED NOT DETECTED Final   Mycoplasma pneumoniae NOT DETECTED NOT DETECTED Final    Comment: Performed at [facility], [address], [place], [place] [address]  MRSA Next Gen by PCR, Nasal     Status: None   Collection Time: 04/19/24 11:09 PM   Specimen: Nasal Mucosa; Nasal Swab  Result Value Ref Range Status   MRSA by PCR Next Gen NOT DETECTED NOT DETECTED Final    Comment: (NOTE) The GeneXpert MRSA Assay (FDA approved for NASAL specimens only), is one component of a comprehensive MRSA colonization surveillance program. It is not intended to diagnose MRSA  infection nor to guide or monitor treatment for MRSA infections. Test performance is not FDA approved in patients less than 32 years old. Performed at [facility], [address], [place], [place] [address]   Body fluid culture w Gram Stain     Status: None (Preliminary result)   Collection Time: 04/20/24 11:47 AM   Specimen: Pleural Fluid  Result Value Ref Range Status   Specimen Description   Final    FLUID RIGHT PLEURAL Performed at [facility], [address], [place], [place] [address]    Special Requests   Final    NONE Performed at [facility], [address] [place], [place] [address]    Gram Stain   Final    ABUNDANT WBC PRESENT, PREDOMINANTLY MONONUCLEAR NO ORGANISMS SEEN    Culture   Final    NO GROWTH 2 DAYS Performed at [facility], [address], [place], [place] [address]    Report Status PENDING  Incomplete  Culture, BAL-quantitative w Gram Stain     Status: None (Preliminary result)   Collection Time: 04/22/24 12:16 PM   Specimen: Bronchial Alveolar Lavage; Respiratory  Result Value Ref Range Status   Specimen Description   Final    BRONCHIAL ALVEOLAR LAVAGE Performed at [facility], [address], [place], [place] [address]    Special Requests   Final    NONE Performed at [facility], [address], [place], [place] [address]    Gram Stain   Final    NO WBC SEEN NO ORGANISMS SEEN Performed at [facility], [address] [place], [place] [address]    Culture PENDING  Incomplete   Report Status PENDING  Incomplete  Aerobic/Anaerobic Culture w Gram Stain (surgical/deep wound)     Status: None (Preliminary result)   Collection Time: 04/22/24 12:16 PM   Specimen: Bronchial Alveolar Lavage; Respiratory  Result Value Ref Range Status   Specimen Description   Final    BRONCHIAL ALVEOLAR LAVAGE Performed at [facility],  [address], [place], [place] [address]    Special Requests   Final    NONE Performed at [facility], [address] [place], [place] [address]    Gram Stain   Final    NO WBC SEEN NO ORGANISMS SEEN Performed at [facility], [address] [place], [place] [address]    Culture PENDING  Incomplete   Report Status PENDING  Incomplete      Radiology Studies: DG Chest Port 1 View Result Date: 04/22/2024 CLINICAL DATA:  Status post bronchoscopy with biopsy. EXAM: PORTABLE CHEST 1 VIEW COMPARISON:  April 20, 2024. FINDINGS: Stable cardiomediastinal  silhouette. Stable bilateral reticulonodular opacities are noted throughout both lungs concerning for pneumonia or edema with associated pleural effusions, right greater than left. No pneumothorax. Bony thorax is unremarkable. IMPRESSION: Bilateral lung opacities and pleural effusions as noted above. No pneumothorax is noted. Electronically Signed   By: Rosalene Colon M.D.   On: 04/22/2024 13:47   DG C-ARM BRONCHOSCOPY Result Date: 04/22/2024 C-ARM BRONCHOSCOPY: Fluoroscopy was utilized by the requesting physician.  No radiographic interpretation.      LOS: 4 days    Aneita Keens, MD [facility] 04/23/2024, 8:51 AM   If 7PM-7AM, please contact night-coverage [email or web address]

## 2024-04-23 NOTE — Evaluation (Signed)
 Physical Therapy Evaluation Patient Details Name: Jose Patrick MRN: [id number] DOB: 12-29-1950 Today's Date: 04/23/2024  History of Present Illness  73 yo M  who presented to [facility] ED 6/2 for eval of persistent cough, suspected PNA. 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 Recent admission 5/15/-04/07/24 for PNA.  Clinical Impression  Pt admitted as above and presenting with functional mobility limitations 2* generalized weakness, mild balance deficits, and decreased activity tolerance with pt currently on 4L O2 - pt reports home baseline is 2-3 L.  Pt states feel better today then has in months and eager to mobilize.  Up to ambulate 200' in hall with RW, CGA and cues for pacing and posture.  Pt on 4L O2 and maintained sats above 91% and HR below 100bpm.  Pt to regain PLOF for return home.      If plan is discharge home, recommend the following: Assistance with cooking/housework;Help with stairs or ramp for entrance   Can travel by private vehicle        Equipment Recommendations None recommended by PT  Recommendations for Other Services       Functional Status Assessment Patient has had a recent decline in their functional status and demonstrates the ability to make significant improvements in function in a reasonable and predictable amount of time.     Precautions / Restrictions Precautions Precautions: Fall Precaution/Restrictions Comments: O2 dep @ baseline Restrictions Weight Bearing Restrictions Per Provider Order: No      Mobility  Bed Mobility Overal bed mobility: Needs Assistance Bed Mobility: Supine to Sit     Supine to sit: HOB elevated, Used rails, Supervision, Contact guard     General bed mobility comments: for safety    Transfers Overall transfer level: Needs assistance Equipment used: Rolling walker (2 wheels) Transfers: Sit to/from Stand Sit to Stand: Contact guard assist           General transfer comment: Steady assist  only    Ambulation/Gait Ambulation/Gait assistance: Contact guard assist Gait Distance (Feet): 200 Feet Assistive device: Rolling walker (2 wheels) Gait Pattern/deviations: Step-through pattern Gait velocity: decreased     General Gait Details: min cues for posture, pacing and position from [employer]            Wheelchair Mobility     Tilt Bed    Modified Rankin (Stroke Patients Only)       Balance Overall balance assessment: Needs assistance Sitting-balance support: No upper extremity supported Sitting balance-Leahy Scale: Normal     Standing balance support: No upper extremity supported, Single extremity supported Standing balance-Leahy Scale: Fair                               Pertinent Vitals/Pain Pain Assessment Pain Assessment: No/denies pain    Home Living Family/patient expects to be discharged to:: Private residence Living Arrangements: Spouse/significant other Available Help at Discharge: Family Type of Home: House Home Access: Stairs to enter Entrance Stairs-Rails: Right Entrance Stairs-Number of Steps: 4   Home Layout: One level;Laundry or work area in [employer]: Rollator (4 wheels)      Prior Function Prior Level of Function : Independent/Modified Independent             Mobility Comments: furniture walks in home, rollator to carry O2 tank out of house       Extremity/Trunk Assessment   Upper Extremity Assessment Upper Extremity Assessment: Generalized weakness  Lower Extremity Assessment Lower Extremity Assessment: Generalized weakness    Cervical / Trunk Assessment Cervical / Trunk Assessment: Normal  Communication   Communication Communication: No apparent difficulties    Cognition Arousal: Alert Behavior During Therapy: WFL for tasks assessed/performed   PT - Cognitive impairments: No apparent impairments                         Following commands: Intact        Cueing Cueing Techniques: Verbal cues     General Comments      Exercises     Assessment/Plan    PT Assessment Patient needs continued PT services  PT Problem List Decreased activity tolerance;Decreased balance;Decreased mobility       PT Treatment Interventions DME instruction;Gait training;Functional mobility training;Therapeutic activities;Therapeutic exercise;Patient/family education;Balance training    PT Goals (Current goals can be found in the Care Plan section)  Acute Rehab PT Goals Patient Stated Goal: continue to get better and get home PT Goal Formulation: With patient Time For Goal Achievement: 05/07/24 Potential to Achieve Goals: Good    Frequency Min 3X/week     Co-evaluation               AM-PAC PT "6 Clicks" Mobility  Outcome Measure Help needed turning from your back to your side while in a flat bed without using bedrails?: A Little Help needed moving from lying on your back to sitting on the side of a flat bed without using bedrails?: A Little Help needed moving to and from a bed to a chair (including a wheelchair)?: A Little Help needed standing up from a chair using your arms (e.g., wheelchair or bedside chair)?: A Little Help needed to walk in hospital room?: A Little Help needed climbing 3-5 steps with a railing? : A Little 6 Click Score: 18    End of Session Equipment Utilized During Treatment: Oxygen Activity Tolerance: Patient tolerated treatment well Patient left: Other (comment) (bathroom) Nurse Communication: Mobility status PT Visit Diagnosis: Unsteadiness on feet (R26.81)    Time: 1610-9604 PT Time Calculation (min) (ACUTE ONLY): 23 min   Charges:   PT Evaluation $PT Eval Low Complexity: 1 Low PT Treatments $Gait Training: 8-22 mins PT General Charges $$ ACUTE PT VISIT: 1 Visit         Thedora Finlay PT Acute Rehabilitation Services Pager [phone] Office [phone]   Jose Patrick 04/23/2024, 4:56 PM

## 2024-04-23 NOTE — Progress Notes (Signed)
 NAME:  Caroline Matters, MRN:  [id number], DOB:  [birth year], LOS: 4 ADMISSION DATE:  04/19/2024, CONSULTATION DATE:  04/19/24 REFERRING MD:  Bonita Bussing , CHIEF COMPLAINT:  cough    History of Present Illness:  73 yo M PMH COPD, lung cancer, chronic hypoxic resp failure on 2L who presented to [facility] ED 6/2 for eval of persistent cough, suspected PNA. Pt states this has been ongoing x 3 mo. Significantly worse at night and when he is laying down. Endorses hx acid reflux, doesn't know if he still takes anything for this. Does not notice any coughing when he eats/drinks, no difficulty swallowing.  Cough gets so bad that he gets chest pain and dizziness. Previously cough was productive and orange, now generally non-productive, sometimes white and bubbly. Has had several abx courses. Last seen by pulm 5/15 in office at which time ED presentation was recommended for IV abx -- admitted 5/15-5/21, 5d zosyn  + steroids.  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 Started on doxy cefepime Admitted to [facility] PCCM consulted in this setting   No leukocytosis or fever BMP w Cr 1.5  LA WNL  Pertinent  Medical History  COPD Lung cancer Chronic hypoxic resp failure  OSA intolerant of CPAP  DM2  CAD HTN  GERD  Significant Hospital Events: Including procedures, antibiotic start and stop dates in addition to other pertinent events   6/2 [facility] admit PCCM consult 6/3 right thoracentesis attempted, ECC obtained 6/5 bronchoscopy with right upper lobe BAL, right middle lobe and lower lobe brushing/biopsies  Interim History / Subjective:  Bronchoscopy yesterday Has continued to have cough but better.  Overall he feels better.  Does have a hoarse voice Nasal cannula 2 L/min.  Does not appear that he wore BiPAP overnight.   Objective    Blood pressure 132/62, pulse 73, temperature 98.7 F (37.1 C), temperature source Oral, resp. rate 16, height 5\' 8"  (1.727 m), weight 89.5 kg, SpO2 94%.        Intake/Output  Summary (Last 24 hours) at 04/23/2024 6578 Last data filed at 04/23/2024 0400 Gross per 24 hour  Intake 1257.88 ml  Output 2000 ml  Net -742.12 ml   Filed Weights   04/19/24 1141 04/23/24 0600  Weight: 105.2 kg 89.5 kg    Examination:  General: Laying in bed, eating breakfast, no distress on his O2 HENT: Hoarse voice, no secretions, no stridor Lungs: Bilateral inspiratory crackles right greater than left Cardiovascular: Regular no murmur Abdomen: Obese, nondistended, nontender Extremities: No deformities, no edema Neuro: Awake, alert, appropriate, follows commands  Resolved problem list   Assessment and Plan   Chronic hypoxic resp failure, multi lobar right-sided pulmonary infiltrates Cough Rhinovirus PNA  COPD OSA Hemoptysis- seems to be very low volume mixed with sputum.  Lung ca (recurrent) s/p RLL rad in 2024 and prior LUL lobectomy in 2013 -CT's of the chest, labs reviewed. Pct is neg. Does have + rhinovirus but was previously negative.  Progressive pulmonary filtrates prior to rhinovirus and has failed multiple courses of antibiotics. Significant airspace disease in the right lung. Concern for recurrent aspiration, scar, radiation pneumonitis vs lymphangitic spread.  -BAL 6/25 > no organisms or WBC, culture pending  P -Today (6/6) is day 5 of empiric antibiotics cefepime and doxycycline, consider discontinuation given reassuring BAL.  Follow culture data completion - Remains on Solu-Medrol  40 once daily.  Consider transition to prednisone  with taper - Follow-up precautions, rhinovirus positive, question precipitating this decompensation -  Cough suppression - PPI as ordered - BiPAP nightly mandatory   Best Practice (right click and "Reselect all SmartList Selections" daily)   Per primary   Labs   CBC: Recent Labs  Lab 04/19/24 1144 04/20/24 0328 04/21/24 0334  WBC 9.1 8.7 12.4*  NEUTROABS  --   --  10.2*  HGB 15.5 14.8 14.4  HCT 49.2 47.8 46.9  MCV 83.4  83.7 83.8  PLT 209 217 232    Basic Metabolic Panel: Recent Labs  Lab 04/19/24 1144 04/20/24 0328 04/21/24 0334  NA 137 135 135  K 4.3 5.4* 4.4  CL 104 104 100  CO2 25 24 27   GLUCOSE 161* 158* 157*  BUN 15 16 27*  CREATININE 1.52* 1.09 1.18  CALCIUM  9.0 8.9 8.9   GFR: Estimated Creatinine Clearance: 61.5 mL/min (by C-G formula based on SCr of 1.18 mg/dL). Recent Labs  Lab 04/19/24 1144 04/19/24 1423 04/19/24 1753 04/19/24 1805 04/20/24 0328 04/21/24 0334  PROCALCITON  --   --  <0.10  --  <0.10  --   WBC 9.1  --   --   --  8.7 12.4*  LATICACIDVEN  --  1.2  --  1.3  --   --     Liver Function Tests: Recent Labs  Lab 04/20/24 0328  AST 18  ALT 27  ALKPHOS 47  BILITOT 0.6  PROT 5.7*  ALBUMIN 2.6*   No results for input(s): "LIPASE", "AMYLASE" in the last 168 hours. No results for input(s): "AMMONIA" in the last 168 hours.  ABG    Component Value Date/Time   TCO2 25 12/02/2023 1616     Coagulation Profile: Recent Labs  Lab 04/19/24 1415  INR 1.1    Cardiac Enzymes: No results for input(s): "CKTOTAL", "CKMB", "CKMBINDEX", "TROPONINI" in the last 168 hours.  HbA1C: Hgb A1c MFr Bld  Date/Time Value Ref Range Status  04/03/2024 03:52 AM 7.8 (H) 4.8 - 5.6 % Final    Comment:    (NOTE) Pre diabetes:          5.7%-6.4%  Diabetes:              >6.4%  Glycemic control for   <7.0% adults with diabetes   09/30/2012 10:31 AM 7.6 (H) 4.6 - 6.5 % Final    Comment:    Glycemic Control Guidelines for People with Diabetes:Non Diabetic:  <6%Goal of Therapy: <7%Additional Action Suggested:  >8%     CBG: Recent Labs  Lab 04/21/24 2238 04/22/24 0744 04/22/24 1431 04/22/24 1633 04/22/24 2206  GLUCAP 231* 81 104* 177* 257*    Racheal Buddle, MD, PhD 04/23/2024, 7:18 AM [facility] [phone] or if no answer before 7:00PM call [phone] For any issues after 7:00PM please call eLink [phone]

## 2024-04-24 DIAGNOSIS — J9621 Acute and chronic respiratory failure with hypoxia: Secondary | ICD-10-CM | POA: Diagnosis not present

## 2024-04-24 DIAGNOSIS — J189 Pneumonia, unspecified organism: Secondary | ICD-10-CM | POA: Diagnosis not present

## 2024-04-24 DIAGNOSIS — E1140 Type 2 diabetes mellitus with diabetic neuropathy, unspecified: Secondary | ICD-10-CM | POA: Diagnosis not present

## 2024-04-24 DIAGNOSIS — I10 Essential (primary) hypertension: Secondary | ICD-10-CM | POA: Diagnosis not present

## 2024-04-24 LAB — GLUCOSE, CAPILLARY
Glucose-Capillary: 123 mg/dL — ABNORMAL HIGH (ref 70–99)
Glucose-Capillary: 223 mg/dL — ABNORMAL HIGH (ref 70–99)
Glucose-Capillary: 521 mg/dL (ref 70–99)
Glucose-Capillary: 541 mg/dL (ref 70–99)
Glucose-Capillary: 476 mg/dL — ABNORMAL HIGH (ref 70–99)

## 2024-04-24 LAB — CULTURE, BLOOD (ROUTINE X 2)
Culture: NO GROWTH
Culture: NO GROWTH

## 2024-04-24 NOTE — Progress Notes (Signed)
 PROGRESS NOTE    Jose Patrick  OZH:[id number] DOB: Jul 11, 1951 DOA: 04/19/2024 PCP: [name]   Brief Narrative: Jose Patrick is a 73 y.o. male with a history of BPH, bipolar disorder, constipation, diabetes mellitus type 2, hyperlipidemia, PTSD, lumbar spinal stenosis.  Patient presented secondary to shortness of breath with evidence of recurrent pneumonia, rhinovirus positive, in addition to acute on chronic hypoxia. Pulmonology consulted. Bronchoscopy performed on 6/5.   Assessment and Plan:  Acute on chronic respiratory failure with hypoxia Baseline 2 L/min of oxygen. Requiring up to 4 L/min this admission. CT evidence of right lower lung airspce disease in addition to developing diffuse nodular infiltration throught the left lung. Possible sequela from recurrent pneumonia vs radiation pneumonitis vs organizing pneumonia. Also concern for persistent cancer with lymphangitic spread. Pulmonology is consulted. Patient started empirically on steroids, Cefepime  and doxycycline . Bronchoscopy performed on 6/5, which does not suggest infection; pathology pending. Weaned to 3 L/min -Continue prednisone  with plan for taper -Pulmonology recommendations: prednisone , follow-up culture data and cytology -Continue PPI -Wean to baseline 2 L/min as able. -Discontinue Cefepime  and doxycycline   Rhinovirus pneumonia Noted. CT imaging suggesting pneumonia. -Continue supportive care  Hemoptysis Mild/streaky. In setting of persistent cough and recent bronchoscopy. Stable hemoglobin.  Bilateral pleural effusions Noted on CT imaging. Right greater than left.  Lung cancer, recurrent History of remote left upper lobe lobectomy and concern for right lower lobe lung cancer s/p recent radiation. Concern for lymphangitic spread.  Diabetes mellitus type 2 with neuropathy Diabetes is well controlled for age with hemoglobin A1C of 7.8%. Patient is on solumedrol for management of  respiratory failure. -Continue Semglee  and SSI  Primary hypertension -Continue Toprol  XL  CAD Hyperlipidemia -Continue Lipitor, Zetia , aspirin  (held for bronchoscopy) and Ranexa   GERD -Continue Protonix   OSA -Continue CPAP at bedtime  Chronic constipation -Continue MiraLAX   BPH Patient with lower urinary tract symptoms. Patient started on finasteride . -Continue finasteride   PTSD Bipolar disorder Noted. Not currently on medication therapy.  Lumbar stenosis Noted.  Morbid obesity, class II Estimated body mass index is 30 kg/m as calculated from the following:   Height as of this encounter: 5\' 8"  (1.727 m).   Weight as of this encounter: 89.5 kg.   DVT prophylaxis: Lovenox  (held for bronchoscopy). SCDs. Code Status:   Code Status: Full Code Family Communication: None at bedside Disposition Plan: Discharge home likely in 24 hours   Consultants:  Pulmonology  Procedures:  None  Antimicrobials: Doxycycline   Cefepime    Subjective: Some mild blood streaked sputum that is less. Breathing well. No issues from overnight.  Objective: BP 112/87   Pulse 69   Temp (!) 96.8 F (36 C) (Axillary) Comment: thermostat adjusted; will check again. Comment (Src): pt was eating ice chips at this time; will recheck oral  Resp 16   Ht 5\' 8"  (1.727 m)   Wt 89.5 kg   SpO2 97%   BMI 30.00 kg/m   Examination:  General exam: Appears calm and comfortable Respiratory system: Mild wheezing. Respiratory effort normal. Cardiovascular system: S1 & S2 heard, RRR. No murmurs, rubs, gallops or clicks. Gastrointestinal system: Abdomen is distended, soft and nontender. Normal bowel sounds heard. Central nervous system: Alert and oriented. No focal neurological deficits. Psychiatry: Judgement and insight appear normal. Mood & affect appropriate.    Data Reviewed: I have personally reviewed following labs and imaging studies  CBC Lab Results  Component Value Date   WBC 10.0  04/23/2024   RBC 5.66 04/23/2024  HGB 14.9 04/23/2024   HCT 48.7 04/23/2024   MCV 86.0 04/23/2024   MCH 26.3 04/23/2024   PLT 221 04/23/2024   MCHC 30.6 04/23/2024   RDW 15.5 04/23/2024   LYMPHSABS 1.4 04/21/2024   MONOABS 0.8 04/21/2024   EOSABS 0.1 04/21/2024   BASOSABS 0.0 04/21/2024     Last metabolic panel Lab Results  Component Value Date   NA 134 (L) 04/23/2024   K 4.5 04/23/2024   CL 102 04/23/2024   CO2 23 04/23/2024   BUN 24 (H) 04/23/2024   CREATININE 1.03 04/23/2024   GLUCOSE 370 (H) 04/23/2024   GFRNONAA >60 04/23/2024   GFRAA  02/23/2009    >60        The eGFR has been calculated using the MDRD equation. This calculation has not been validated in all clinical situations. eGFR's persistently <60 mL/min signify possible Chronic Kidney Disease.   CALCIUM  9.2 04/23/2024   PROT 5.7 (L) 04/20/2024   ALBUMIN 2.6 (L) 04/20/2024   BILITOT 0.6 04/20/2024   ALKPHOS 47 04/20/2024   AST 18 04/20/2024   ALT 27 04/20/2024   ANIONGAP 9 04/23/2024    GFR: Estimated Creatinine Clearance: 70.4 mL/min (by C-G formula based on SCr of 1.03 mg/dL).  Recent Results (from the past 240 hours)  Resp panel by RT-PCR (RSV, Flu A&B, Covid) Anterior Nasal Swab     Status: None   Collection Time: 04/19/24  1:55 PM   Specimen: Anterior Nasal Swab  Result Value Ref Range Status   SARS Coronavirus 2 by RT PCR NEGATIVE NEGATIVE Final    Comment: (NOTE) SARS-CoV-2 target nucleic acids are NOT DETECTED.  The SARS-CoV-2 RNA is generally detectable in upper respiratory specimens during the acute phase of infection. The lowest concentration of SARS-CoV-2 viral copies this assay can detect is 138 copies/mL. A negative result does not preclude SARS-Cov-2 infection and should not be used as the sole basis for treatment or other patient management decisions. A negative result may occur with  improper specimen collection/handling, submission of specimen other than nasopharyngeal  swab, presence of viral mutation(s) within the areas targeted by this assay, and inadequate number of viral copies(<138 copies/mL). A negative result must be combined with clinical observations, patient history, and epidemiological information. The expected result is Negative.  Fact Sheet for Patients:  BloggerCourse.com  Fact Sheet for Healthcare Providers:  SeriousBroker.it  This test is no t yet approved or cleared by the [place]  FDA and  has been authorized for detection and/or diagnosis of SARS-CoV-2 by FDA under an Emergency Use Authorization (EUA). This EUA will remain  in effect (meaning this test can be used) for the duration of the COVID-19 declaration under Section 564(b)(1) of the Act, 21 U.S.C.section 360bbb-3(b)(1), unless the authorization is terminated  or revoked sooner.       Influenza A by PCR NEGATIVE NEGATIVE Final   Influenza B by PCR NEGATIVE NEGATIVE Final    Comment: (NOTE) The Xpert Xpress SARS-CoV-2/FLU/RSV plus assay is intended as an aid in the diagnosis of influenza from Nasopharyngeal swab specimens and should not be used as a sole basis for treatment. Nasal washings and aspirates are unacceptable for Xpert Xpress SARS-CoV-2/FLU/RSV testing.  Fact Sheet for Patients: BloggerCourse.com  Fact Sheet for Healthcare Providers: SeriousBroker.it  This test is not yet approved or cleared by the [place]  FDA and has been authorized for detection and/or diagnosis of SARS-CoV-2 by FDA under an Emergency Use Authorization (EUA). This EUA will remain in effect (meaning  this test can be used) for the duration of the COVID-19 declaration under Section 564(b)(1) of the Act, 21 U.S.C. section 360bbb-3(b)(1), unless the authorization is terminated or revoked.     Resp Syncytial Virus by PCR NEGATIVE NEGATIVE Final    Comment: (NOTE) Fact Sheet for  Patients: BloggerCourse.com  Fact Sheet for Healthcare Providers: SeriousBroker.it  This test is not yet approved or cleared by the [place]  FDA and has been authorized for detection and/or diagnosis of SARS-CoV-2 by FDA under an Emergency Use Authorization (EUA). This EUA will remain in effect (meaning this test can be used) for the duration of the COVID-19 declaration under Section 564(b)(1) of the Act, 21 U.S.C. section 360bbb-3(b)(1), unless the authorization is terminated or revoked.  Performed at [facility], [address] [place], [place] [address]   Blood Culture (routine x 2)     Status: None   Collection Time: 04/19/24  2:04 PM   Specimen: BLOOD  Result Value Ref Range Status   Specimen Description   Final    BLOOD RIGHT ANTECUBITAL Performed at [facility], [address], [place], [place] [address]    Special Requests   Final    BOTTLES DRAWN AEROBIC AND ANAEROBIC Blood Culture results may not be optimal due to an inadequate volume of blood received in culture bottles Performed at [facility], [address] [place], [place] [address]    Culture   Final    NO GROWTH 5 DAYS Performed at [facility], [address] [place], [place] [address]    Report Status 04/24/2024 FINAL  Final  Blood Culture (routine x 2)     Status: None   Collection Time: 04/19/24  2:26 PM   Specimen: BLOOD  Result Value Ref Range Status   Specimen Description   Final    BLOOD LEFT ANTECUBITAL Performed at [facility], [address] [place], [place] [address]    Special Requests   Final    BOTTLES DRAWN AEROBIC AND ANAEROBIC Blood Culture results may not be optimal due to an inadequate volume of blood received in culture bottles Performed at [facility], [address], [place], [place] [address]    Culture   Final    NO  GROWTH 5 DAYS Performed at [facility], [address], [place], [place] [address]    Report Status 04/24/2024 FINAL  Final  Respiratory (~20 pathogens) panel by PCR     Status: Abnormal   Collection Time: 04/19/24 11:09 PM   Specimen: Nasal Mucosa; Respiratory  Result Value Ref Range Status   Adenovirus NOT DETECTED NOT DETECTED Final   Coronavirus 229E NOT DETECTED NOT DETECTED Final    Comment: (NOTE) The Coronavirus on the Respiratory Panel, DOES NOT test for the novel  Coronavirus (2019 nCoV)    Coronavirus HKU1 NOT DETECTED NOT DETECTED Final   Coronavirus NL63 NOT DETECTED NOT DETECTED Final   Coronavirus OC43 NOT DETECTED NOT DETECTED Final   Metapneumovirus NOT DETECTED NOT DETECTED Final   Rhinovirus / Enterovirus DETECTED (A) NOT DETECTED Final   Influenza A NOT DETECTED NOT DETECTED Final   Influenza B NOT DETECTED NOT DETECTED Final   Parainfluenza Virus 1 NOT DETECTED NOT DETECTED Final   Parainfluenza Virus 2 NOT DETECTED NOT DETECTED Final   Parainfluenza Virus 3 NOT DETECTED NOT DETECTED Final   Parainfluenza Virus 4 NOT DETECTED NOT DETECTED Final   Respiratory Syncytial Virus  NOT DETECTED NOT DETECTED Final   Bordetella pertussis NOT DETECTED NOT DETECTED Final   Bordetella Parapertussis NOT DETECTED NOT DETECTED Final   Chlamydophila pneumoniae NOT DETECTED NOT DETECTED Final   Mycoplasma pneumoniae NOT DETECTED NOT DETECTED Final    Comment: Performed at [facility], [address], [place], [place] [address]  MRSA Next Gen by PCR, Nasal     Status: None   Collection Time: 04/19/24 11:09 PM   Specimen: Nasal Mucosa; Nasal Swab  Result Value Ref Range Status   MRSA by PCR Next Gen NOT DETECTED NOT DETECTED Final    Comment: (NOTE) The GeneXpert MRSA Assay (FDA approved for NASAL specimens only), is one component of a comprehensive MRSA colonization surveillance program. It is not intended to diagnose MRSA infection nor to guide or monitor  treatment for MRSA infections. Test performance is not FDA approved in patients less than 36 years old. Performed at [facility], [address], [place], [place] [address]   Body fluid culture w Gram Stain     Status: None   Collection Time: 04/20/24 11:47 AM   Specimen: Pleural Fluid  Result Value Ref Range Status   Specimen Description   Final    FLUID RIGHT PLEURAL Performed at [facility], [address] [place], [place] [address]    Special Requests   Final    NONE Performed at [facility], [address], [place], [place] [address]    Gram Stain   Final    ABUNDANT WBC PRESENT, PREDOMINANTLY MONONUCLEAR NO ORGANISMS SEEN    Culture   Final    NO GROWTH 3 DAYS Performed at [facility], [address] [place], [place] [address]    Report Status 04/23/2024 FINAL  Final  Culture, BAL-quantitative w Gram Stain     Status: None (Preliminary result)   Collection Time: 04/22/24 12:16 PM   Specimen: Bronchial Alveolar Lavage; Respiratory  Result Value Ref Range Status   Specimen Description   Final    BRONCHIAL ALVEOLAR LAVAGE Performed at [facility], [address], [place], [place] [address]    Special Requests   Final    NONE Performed at [facility], [address], [place], [place] [address]    Gram Stain NO WBC SEEN NO ORGANISMS SEEN   Final   Culture   Final    NO GROWTH < 24 HOURS Performed at [facility], [address] [place], [place] [address]    Report Status PENDING  Incomplete  Aerobic/Anaerobic Culture w Gram Stain (surgical/deep wound)     Status: None (Preliminary result)   Collection Time: 04/22/24 12:16 PM   Specimen: Bronchial Alveolar Lavage; Respiratory  Result Value Ref Range Status   Specimen Description   Final    BRONCHIAL ALVEOLAR LAVAGE Performed at [facility], [address] [place], [place]  [address]    Special Requests   Final    NONE Performed at [facility], [address], [place], [place] [address]    Gram Stain NO WBC SEEN NO ORGANISMS SEEN   Final   Culture   Final    NO GROWTH 2 DAYS Performed at [facility], [address], [place], [place] [address]    Report Status PENDING  Incomplete  Acid Fast Smear (AFB)     Status: None   Collection Time: 04/22/24 12:16 PM  Specimen: Bronchial Alveolar Lavage; Respiratory  Result Value Ref Range Status   AFB Specimen Processing Concentration  Final   Acid Fast Smear Negative  Final    Comment: (NOTE) Performed At: [facility] [address] [place], [place] [id number] Pearlean Botts MD NG:[id number]    Source (AFB) BRONCHIAL ALVEOLAR LAVAGE  Final    Comment: Performed at [facility], [address], [place], [place] [address]      Radiology Studies: [facility] Chest Port 1 View Result Date: 04/22/2024 CLINICAL DATA:  Status post bronchoscopy with biopsy. EXAM: PORTABLE CHEST 1 VIEW COMPARISON:  April 20, 2024. FINDINGS: Stable cardiomediastinal silhouette. Stable bilateral reticulonodular opacities are noted throughout both lungs concerning for pneumonia or edema with associated pleural effusions, right greater than left. No pneumothorax. Bony thorax is unremarkable. IMPRESSION: Bilateral lung opacities and pleural effusions as noted above. No pneumothorax is noted. Electronically Signed   By: Rosalene Colon M.D.   On: 04/22/2024 13:47   DG C-ARM BRONCHOSCOPY Result Date: 04/22/2024 C-ARM BRONCHOSCOPY: Fluoroscopy was utilized by the requesting physician.  No radiographic interpretation.      LOS: 5 days    Aneita Keens, MD [facility] 04/24/2024, 11:40 AM   If 7PM-7AM, please contact night-coverage [email or web address]

## 2024-04-24 NOTE — Plan of Care (Signed)
   Problem: Education: Goal: Ability to describe self-care measures that may prevent or decrease complications (Diabetes Survival Skills Education) will improve Outcome: Progressing Goal: Individualized Educational Video(s) Outcome: Progressing   Problem: Coping: Goal: Ability to adjust to condition or change in health will improve Outcome: Progressing   Problem: Fluid Volume: Goal: Ability to maintain a balanced intake and output will improve Outcome: Progressing   Problem: Health Behavior/Discharge Planning: Goal: Ability to identify and utilize available resources and services will improve Outcome: Progressing Goal: Ability to manage health-related needs will improve Outcome: Progressing   Problem: Metabolic: Goal: Ability to maintain appropriate glucose levels will improve Outcome: Progressing   Problem: Nutritional: Goal: Maintenance of adequate nutrition will improve Outcome: Progressing Goal: Progress toward achieving an optimal weight will improve Outcome: Progressing   Problem: Skin Integrity: Goal: Risk for impaired skin integrity will decrease Outcome: Progressing   Problem: Tissue Perfusion: Goal: Adequacy of tissue perfusion will improve Outcome: Progressing   Problem: Activity: Goal: Ability to tolerate increased activity will improve Outcome: Progressing   Problem: Clinical Measurements: Goal: Ability to maintain a body temperature in the normal range will improve Outcome: Progressing   Problem: Respiratory: Goal: Ability to maintain adequate ventilation will improve Outcome: Progressing Goal: Ability to maintain a clear airway will improve Outcome: Progressing   Problem: Education: Goal: Knowledge of General Education information will improve Description: Including pain rating scale, medication(s)/side effects and non-pharmacologic comfort measures Outcome: Progressing   Problem: Health Behavior/Discharge Planning: Goal: Ability to manage  health-related needs will improve Outcome: Progressing   Problem: Clinical Measurements: Goal: Ability to maintain clinical measurements within normal limits will improve Outcome: Progressing Goal: Will remain free from infection Outcome: Progressing Goal: Diagnostic test results will improve Outcome: Progressing Goal: Respiratory complications will improve Outcome: Progressing Goal: Cardiovascular complication will be avoided Outcome: Progressing   Problem: Activity: Goal: Risk for activity intolerance will decrease Outcome: Progressing   Problem: Nutrition: Goal: Adequate nutrition will be maintained Outcome: Progressing   Problem: Coping: Goal: Level of anxiety will decrease Outcome: Progressing   Problem: Elimination: Goal: Will not experience complications related to bowel motility Outcome: Progressing Goal: Will not experience complications related to urinary retention Outcome: Progressing   Problem: Pain Managment: Goal: General experience of comfort will improve and/or be controlled Outcome: Progressing   Problem: Safety: Goal: Ability to remain free from injury will improve Outcome: Progressing   Problem: Skin Integrity: Goal: Risk for impaired skin integrity will decrease Outcome: Progressing

## 2024-04-24 NOTE — Progress Notes (Signed)
   04/24/24 2000  BiPAP/CPAP/SIPAP  $ Non-Invasive Home Ventilator  Initial  $ Face Mask Large  Yes  BiPAP/CPAP/SIPAP Pt Type Adult  BiPAP/CPAP/SIPAP DREAMSTATIOND  Mask Type Full face mask  Dentures removed? Yes - Placed in denture cup  Mask Size Large  IPAP 10 cmH20  EPAP 5 cmH2O  PEEP 4 cmH20  Patient Home Machine No  Patient Home Mask No  Patient Home Tubing No  Auto Titrate No  Device Plugged into RED Power Outlet Yes

## 2024-04-24 NOTE — Progress Notes (Signed)
 PCCM interval progress note  - BAL culture data all still negative - Transbronchial biopsy and brushing from right middle lobe and right lower lobe all still pending  Overall he appears to be clinically better Will continue to follow cytology results  Racheal Buddle, MD, PhD 04/24/2024, 8:15 AM [facility] [phone] or if no answer before 7:00PM call [phone] For any issues after 7:00PM please call eLink [phone]

## 2024-04-24 NOTE — Progress Notes (Signed)
 Physical Therapy Treatment Patient Details Name: Jose Patrick MRN: [id number] DOB: 04-17-1951 Today's Date: 04/24/2024   History of Present Illness 73 yo M  who presented to [facility] ED 6/2 for eval of persistent cough, suspected PNA. 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 Recent admission 5/15/-04/07/24 for PNA.    PT Comments  Pt motivated and eager to attempt mobility. Pt up to ambulate increased distance in hall but requiring transport back to room 2* fatigue and report of increased difficulty breathing - pt with noted increased WOB but with SaO2 maintained 91-96% and HR max 104 on 4L. Pt states it is bc he is "full of gas".  RN aware.    If plan is discharge home, recommend the following: Assistance with cooking/housework;Help with stairs or ramp for entrance   Can travel by private vehicle        Equipment Recommendations  None recommended by PT    Recommendations for Other Services       Precautions / Restrictions Precautions Precautions: Fall Precaution/Restrictions Comments: O2 dep @ baseline Restrictions Weight Bearing Restrictions Per Provider Order: No     Mobility  Bed Mobility               General bed mobility comments: Pt up in chair and requests back to same    Transfers Overall transfer level: Needs assistance Equipment used: Rolling walker (2 wheels) Transfers: Sit to/from Stand Sit to Stand: Supervision                Ambulation/Gait Ambulation/Gait assistance: Contact guard assist Gait Distance (Feet): 240 Feet Assistive device: Rolling walker (2 wheels) Gait Pattern/deviations: Step-through pattern Gait velocity: decreased     General Gait Details: min cues for posture, pacing and position from [employer]             Wheelchair Mobility     Tilt Bed    Modified Rankin (Stroke Patients Only)       Balance Overall balance assessment: Needs assistance Sitting-balance support: No upper  extremity supported Sitting balance-Leahy Scale: Normal     Standing balance support: No upper extremity supported, Single extremity supported Standing balance-Leahy Scale: Fair                              Hotel manager: No apparent difficulties  Cognition Arousal: Alert Behavior During Therapy: WFL for tasks assessed/performed   PT - Cognitive impairments: No apparent impairments                         Following commands: Intact      Cueing Cueing Techniques: Verbal cues  Exercises      General Comments        Pertinent Vitals/Pain Pain Assessment Pain Assessment: No/denies pain    Home Living                          Prior Function            PT Goals (current goals can now be found in the care plan section) Acute Rehab PT Goals Patient Stated Goal: continue to get better and get home PT Goal Formulation: With patient Time For Goal Achievement: 05/07/24 Potential to Achieve Goals: Good Progress towards PT goals: Progressing toward goals    Frequency    Min 3X/week  PT Plan      Co-evaluation              AM-PAC PT "6 Clicks" Mobility   Outcome Measure  Help needed turning from your back to your side while in a flat bed without using bedrails?: A Little Help needed moving from lying on your back to sitting on the side of a flat bed without using bedrails?: A Little Help needed moving to and from a bed to a chair (including a wheelchair)?: A Little Help needed standing up from a chair using your arms (e.g., wheelchair or bedside chair)?: A Little Help needed to walk in hospital room?: A Little Help needed climbing 3-5 steps with a railing? : A Little 6 Click Score: 18    End of Session Equipment Utilized During Treatment: Oxygen Activity Tolerance: Patient tolerated treatment well Patient left: in chair;with call bell/phone within reach;with chair alarm set;with  family/visitor present Nurse Communication: Mobility status PT Visit Diagnosis: Unsteadiness on feet (R26.81)     Time: 1521-1540 PT Time Calculation (min) (ACUTE ONLY): 19 min  Charges:    $Gait Training: 8-22 mins PT General Charges $$ ACUTE PT VISIT: 1 Visit                     Thedora Finlay PT Acute Rehabilitation Services Pager [phone] Office [phone]    [name] 04/24/2024, 4:11 PM

## 2024-04-25 ENCOUNTER — Inpatient Hospital Stay (HOSPITAL_COMMUNITY)

## 2024-04-25 DIAGNOSIS — I10 Essential (primary) hypertension: Secondary | ICD-10-CM | POA: Diagnosis not present

## 2024-04-25 DIAGNOSIS — E1140 Type 2 diabetes mellitus with diabetic neuropathy, unspecified: Secondary | ICD-10-CM | POA: Diagnosis not present

## 2024-04-25 DIAGNOSIS — J189 Pneumonia, unspecified organism: Secondary | ICD-10-CM | POA: Diagnosis not present

## 2024-04-25 DIAGNOSIS — J9621 Acute and chronic respiratory failure with hypoxia: Secondary | ICD-10-CM | POA: Diagnosis not present

## 2024-04-25 LAB — CBC
HCT: 49.2 % (ref 39.0–52.0)
Hemoglobin: 15.4 g/dL (ref 13.0–17.0)
MCH: 26.6 pg (ref 26.0–34.0)
MCHC: 31.3 g/dL (ref 30.0–36.0)
MCV: 84.8 fL (ref 80.0–100.0)
Platelets: 362 10*3/uL (ref 150–400)
RBC: 5.8 MIL/uL (ref 4.22–5.81)
RDW: 15.7 % — ABNORMAL HIGH (ref 11.5–15.5)
WBC: 10.6 10*3/uL — ABNORMAL HIGH (ref 4.0–10.5)
nRBC: 0 % (ref 0.0–0.2)

## 2024-04-25 LAB — GLUCOSE, CAPILLARY
Glucose-Capillary: 218 mg/dL — ABNORMAL HIGH (ref 70–99)
Glucose-Capillary: 256 mg/dL — ABNORMAL HIGH (ref 70–99)
Glucose-Capillary: 288 mg/dL — ABNORMAL HIGH (ref 70–99)
Glucose-Capillary: 307 mg/dL — ABNORMAL HIGH (ref 70–99)
Glucose-Capillary: 326 mg/dL — ABNORMAL HIGH (ref 70–99)
Glucose-Capillary: 411 mg/dL — ABNORMAL HIGH (ref 70–99)

## 2024-04-25 LAB — CULTURE, BAL-QUANTITATIVE W GRAM STAIN
Culture: NO GROWTH
Gram Stain: NONE SEEN

## 2024-04-25 MED ORDER — TRANEXAMIC ACID FOR INHALATION
500.0000 mg | Freq: Once | RESPIRATORY_TRACT | Status: AC
  Administered 2024-04-25: 500 mg via RESPIRATORY_TRACT
  Filled 2024-04-25: qty 10

## 2024-04-25 MED ORDER — PREDNISONE 20 MG PO TABS
30.0000 mg | ORAL_TABLET | Freq: Every day | ORAL | Status: DC
  Administered 2024-04-26 – 2024-05-02 (×6): 30 mg via ORAL
  Filled 2024-04-25 (×7): qty 1

## 2024-04-25 NOTE — Progress Notes (Signed)
   04/25/24 2308  BiPAP/CPAP/SIPAP  BiPAP/CPAP/SIPAP Pt Type Adult  BiPAP/CPAP/SIPAP DREAMSTATIOND  Mask Type Full face mask  Dentures removed? Yes - Placed in denture cup  Mask Size Large  Flow Rate 6 lpm  Patient Home Machine No  Patient Home Mask No  Patient Home Tubing No  Auto Titrate No  Device Plugged into RED Power Outlet Yes  BiPAP/CPAP /SiPAP Vitals  Pulse Rate (!) 52  Resp 18  SpO2 98 %  MEWS Score/Color  MEWS Score 0  MEWS Score Color Marrie Sizer

## 2024-04-25 NOTE — Plan of Care (Signed)
   Problem: Education: Goal: Ability to describe self-care measures that may prevent or decrease complications (Diabetes Survival Skills Education) will improve Outcome: Progressing Goal: Individualized Educational Video(s) Outcome: Progressing   Problem: Coping: Goal: Ability to adjust to condition or change in health will improve Outcome: Progressing   Problem: Fluid Volume: Goal: Ability to maintain a balanced intake and output will improve Outcome: Progressing   Problem: Health Behavior/Discharge Planning: Goal: Ability to identify and utilize available resources and services will improve Outcome: Progressing Goal: Ability to manage health-related needs will improve Outcome: Progressing   Problem: Metabolic: Goal: Ability to maintain appropriate glucose levels will improve Outcome: Progressing   Problem: Nutritional: Goal: Maintenance of adequate nutrition will improve Outcome: Progressing Goal: Progress toward achieving an optimal weight will improve Outcome: Progressing   Problem: Skin Integrity: Goal: Risk for impaired skin integrity will decrease Outcome: Progressing   Problem: Tissue Perfusion: Goal: Adequacy of tissue perfusion will improve Outcome: Progressing   Problem: Activity: Goal: Ability to tolerate increased activity will improve Outcome: Progressing   Problem: Clinical Measurements: Goal: Ability to maintain a body temperature in the normal range will improve Outcome: Progressing   Problem: Respiratory: Goal: Ability to maintain adequate ventilation will improve Outcome: Progressing Goal: Ability to maintain a clear airway will improve Outcome: Progressing   Problem: Education: Goal: Knowledge of General Education information will improve Description: Including pain rating scale, medication(s)/side effects and non-pharmacologic comfort measures Outcome: Progressing   Problem: Health Behavior/Discharge Planning: Goal: Ability to manage  health-related needs will improve Outcome: Progressing   Problem: Clinical Measurements: Goal: Ability to maintain clinical measurements within normal limits will improve Outcome: Progressing Goal: Will remain free from infection Outcome: Progressing Goal: Diagnostic test results will improve Outcome: Progressing Goal: Respiratory complications will improve Outcome: Progressing Goal: Cardiovascular complication will be avoided Outcome: Progressing   Problem: Activity: Goal: Risk for activity intolerance will decrease Outcome: Progressing   Problem: Nutrition: Goal: Adequate nutrition will be maintained Outcome: Progressing   Problem: Coping: Goal: Level of anxiety will decrease Outcome: Progressing   Problem: Elimination: Goal: Will not experience complications related to bowel motility Outcome: Progressing Goal: Will not experience complications related to urinary retention Outcome: Progressing   Problem: Pain Managment: Goal: General experience of comfort will improve and/or be controlled Outcome: Progressing   Problem: Safety: Goal: Ability to remain free from injury will improve Outcome: Progressing   Problem: Skin Integrity: Goal: Risk for impaired skin integrity will decrease Outcome: Progressing

## 2024-04-25 NOTE — Plan of Care (Signed)
  Problem: Education: Goal: Ability to describe self-care measures that may prevent or decrease complications (Diabetes Survival Skills Education) will improve Outcome: Progressing Goal: Individualized Educational Video(s) Outcome: Progressing   Problem: Coping: Goal: Ability to adjust to condition or change in health will improve Outcome: Progressing   Problem: Fluid Volume: Goal: Ability to maintain a balanced intake and output will improve Outcome: Progressing   Problem: Health Behavior/Discharge Planning: Goal: Ability to identify and utilize available resources and services will improve Outcome: Progressing Goal: Ability to manage health-related needs will improve Outcome: Progressing   Problem: Metabolic: Goal: Ability to maintain appropriate glucose levels will improve Outcome: Progressing   Problem: Nutritional: Goal: Progress toward achieving an optimal weight will improve Outcome: Progressing   Problem: Skin Integrity: Goal: Risk for impaired skin integrity will decrease Outcome: Progressing   Problem: Tissue Perfusion: Goal: Adequacy of tissue perfusion will improve Outcome: Progressing   Problem: Activity: Goal: Ability to tolerate increased activity will improve Outcome: Progressing   Problem: Clinical Measurements: Goal: Ability to maintain a body temperature in the normal range will improve Outcome: Progressing   Problem: Respiratory: Goal: Ability to maintain adequate ventilation will improve Outcome: Progressing Goal: Ability to maintain a clear airway will improve Outcome: Progressing   Problem: Education: Goal: Knowledge of General Education information will improve Description: Including pain rating scale, medication(s)/side effects and non-pharmacologic comfort measures Outcome: Progressing   Problem: Health Behavior/Discharge Planning: Goal: Ability to manage health-related needs will improve Outcome: Progressing   Problem: Clinical  Measurements: Goal: Will remain free from infection Outcome: Progressing Goal: Respiratory complications will improve Outcome: Progressing Goal: Cardiovascular complication will be avoided Outcome: Progressing   Problem: Activity: Goal: Risk for activity intolerance will decrease Outcome: Progressing   Problem: Nutrition: Goal: Adequate nutrition will be maintained Outcome: Progressing   Problem: Coping: Goal: Level of anxiety will decrease Outcome: Progressing   Problem: Elimination: Goal: Will not experience complications related to bowel motility Outcome: Progressing Goal: Will not experience complications related to urinary retention Outcome: Progressing   Problem: Pain Managment: Goal: General experience of comfort will improve and/or be controlled Outcome: Progressing   Problem: Safety: Goal: Ability to remain free from injury will improve Outcome: Progressing   Problem: Skin Integrity: Goal: Risk for impaired skin integrity will decrease Outcome: Progressing   Problem: Nutritional: Goal: Maintenance of adequate nutrition will improve Outcome: Not Progressing   Problem: Clinical Measurements: Goal: Ability to maintain clinical measurements within normal limits will improve Outcome: Not Progressing Goal: Diagnostic test results will improve Outcome: Not Progressing

## 2024-04-25 NOTE — Progress Notes (Signed)
 PROGRESS NOTE    Jose Patrick  EAV:[id number] DOB: May 19, 1951 DOA: 04/19/2024 PCP: [name]   Brief Narrative: Jose Patrick is a 73 y.o. male with a history of BPH, bipolar disorder, constipation, diabetes mellitus type 2, hyperlipidemia, PTSD, lumbar spinal stenosis.  Patient presented secondary to shortness of breath with evidence of recurrent pneumonia, rhinovirus positive, in addition to acute on chronic hypoxia. Pulmonology consulted. Bronchoscopy performed on 6/5.   Assessment and Plan:  Acute on chronic respiratory failure with hypoxia Baseline 2 L/min of oxygen. Requiring up to 4 L/min this admission. CT evidence of right lower lung airspce disease in addition to developing diffuse nodular infiltration throught the left lung. Possible sequela from recurrent pneumonia vs radiation pneumonitis vs organizing pneumonia. Also concern for persistent cancer with lymphangitic spread. Pulmonology is consulted. Patient started empirically on steroids, Cefepime  and doxycycline . Bronchoscopy performed on 6/5, which does not suggest infection; pathology pending. Weaned to 3 L/min -Continue prednisone  with taper -Pulmonology recommendations: prednisone , follow-up culture data and cytology -Continue PPI -Wean to baseline 2 L/min as able.  Rhinovirus pneumonia Noted. CT imaging suggesting pneumonia. -Continue supportive care  Hemoptysis Continued hemoptysis. Appears to be slightly worsened. Aspirin  and Lovenox  still held.  -Will touch base with pulmonology for recommendations  Bilateral pleural effusions Noted on CT imaging. Right greater than left.  Lung cancer, recurrent History of remote left upper lobe lobectomy and concern for right lower lobe lung cancer s/p recent radiation. Concern for lymphangitic spread.  Diabetes mellitus type 2 with neuropathy Diabetes is well controlled for age with hemoglobin A1C of 7.8%. Patient is on solumedrol for management of  respiratory failure. -Continue Semglee  and SSI  Primary hypertension -Continue Toprol  XL  CAD Hyperlipidemia -Continue Lipitor, Zetia , aspirin  (held for bronchoscopy) and Ranexa   GERD -Continue Protonix   OSA -Continue CPAP at bedtime  Chronic constipation -Continue MiraLAX   BPH Patient with lower urinary tract symptoms. Patient started on finasteride . -Continue finasteride   PTSD Bipolar disorder Noted. Not currently on medication therapy.  Lumbar stenosis Noted.  Morbid obesity, class II Estimated body mass index is 30 kg/m as calculated from the following:   Height as of this encounter: 5\' 8"  (1.727 m).   Weight as of this encounter: 89.5 kg.   DVT prophylaxis: Lovenox  (held for bronchoscopy). SCDs. Code Status:   Code Status: Full Code Family Communication: None at bedside Disposition Plan: Discharge home likely in 24 hours   Consultants:  Pulmonology  Procedures:  None  Antimicrobials: Doxycycline   Cefepime    Subjective: Still with persistent hemoptysis. Otherwise, feels well.  Objective: BP 129/65 (BP Location: Right Arm)   Pulse 77   Temp 98 F (36.7 C)   Resp 20   Ht 5\' 8"  (1.727 m)   Wt 89.5 kg   SpO2 93%   BMI 30.00 kg/m   Examination:  General exam: Appears calm and comfortable Respiratory system: Scattered wheezing/rhonchi. Respiratory effort normal. Cardiovascular system: S1 & S2 heard, RRR. Gastrointestinal system: Abdomen is nondistended, soft and nontender. Normal bowel sounds heard. Central nervous system: Alert and oriented. No focal neurological deficits. Psychiatry: Judgement and insight appear normal. Mood & affect appropriate.    Data Reviewed: I have personally reviewed following labs and imaging studies  CBC Lab Results  Component Value Date   WBC 10.6 (H) 04/25/2024   RBC 5.80 04/25/2024   HGB 15.4 04/25/2024   HCT 49.2 04/25/2024   MCV 84.8 04/25/2024   MCH 26.6 04/25/2024   PLT 362 04/25/2024  MCHC  31.3 04/25/2024   RDW 15.7 (H) 04/25/2024   LYMPHSABS 1.4 04/21/2024   MONOABS 0.8 04/21/2024   EOSABS 0.1 04/21/2024   BASOSABS 0.0 04/21/2024     Last metabolic panel Lab Results  Component Value Date   NA 134 (L) 04/23/2024   K 4.5 04/23/2024   CL 102 04/23/2024   CO2 23 04/23/2024   BUN 24 (H) 04/23/2024   CREATININE 1.03 04/23/2024   GLUCOSE 370 (H) 04/23/2024   GFRNONAA >60 04/23/2024   GFRAA  02/23/2009    >60        The eGFR has been calculated using the MDRD equation. This calculation has not been validated in all clinical situations. eGFR's persistently <60 mL/min signify possible Chronic Kidney Disease.   CALCIUM  9.2 04/23/2024   PROT 5.7 (L) 04/20/2024   ALBUMIN 2.6 (L) 04/20/2024   BILITOT 0.6 04/20/2024   ALKPHOS 47 04/20/2024   AST 18 04/20/2024   ALT 27 04/20/2024   ANIONGAP 9 04/23/2024    GFR: Estimated Creatinine Clearance: 70.4 mL/min (by C-G formula based on SCr of 1.03 mg/dL).  Recent Results (from the past 240 hours)  Resp panel by RT-PCR (RSV, Flu A&B, Covid) Anterior Nasal Swab     Status: None   Collection Time: 04/19/24  1:55 PM   Specimen: Anterior Nasal Swab  Result Value Ref Range Status   SARS Coronavirus 2 by RT PCR NEGATIVE NEGATIVE Final    Comment: (NOTE) SARS-CoV-2 target nucleic acids are NOT DETECTED.  The SARS-CoV-2 RNA is generally detectable in upper respiratory specimens during the acute phase of infection. The lowest concentration of SARS-CoV-2 viral copies this assay can detect is 138 copies/mL. A negative result does not preclude SARS-Cov-2 infection and should not be used as the sole basis for treatment or other patient management decisions. A negative result may occur with  improper specimen collection/handling, submission of specimen other than nasopharyngeal swab, presence of viral mutation(s) within the areas targeted by this assay, and inadequate number of viral copies(<138 copies/mL). A negative result  must be combined with clinical observations, patient history, and epidemiological information. The expected result is Negative.  Fact Sheet for Patients:  BloggerCourse.com  Fact Sheet for Healthcare Providers:  SeriousBroker.it  This test is no t yet approved or cleared by the [place]  FDA and  has been authorized for detection and/or diagnosis of SARS-CoV-2 by FDA under an Emergency Use Authorization (EUA). This EUA will remain  in effect (meaning this test can be used) for the duration of the COVID-19 declaration under Section 564(b)(1) of the Act, 21 U.S.C.section 360bbb-3(b)(1), unless the authorization is terminated  or revoked sooner.       Influenza A by PCR NEGATIVE NEGATIVE Final   Influenza B by PCR NEGATIVE NEGATIVE Final    Comment: (NOTE) The Xpert Xpress SARS-CoV-2/FLU/RSV plus assay is intended as an aid in the diagnosis of influenza from Nasopharyngeal swab specimens and should not be used as a sole basis for treatment. Nasal washings and aspirates are unacceptable for Xpert Xpress SARS-CoV-2/FLU/RSV testing.  Fact Sheet for Patients: BloggerCourse.com  Fact Sheet for Healthcare Providers: SeriousBroker.it  This test is not yet approved or cleared by the [place]  FDA and has been authorized for detection and/or diagnosis of SARS-CoV-2 by FDA under an Emergency Use Authorization (EUA). This EUA will remain in effect (meaning this test can be used) for the duration of the COVID-19 declaration under Section 564(b)(1) of the Act, 21 U.S.C. section 360bbb-3(b)(1), unless  the authorization is terminated or revoked.     Resp Syncytial Virus by PCR NEGATIVE NEGATIVE Final    Comment: (NOTE) Fact Sheet for Patients: BloggerCourse.com  Fact Sheet for Healthcare Providers: SeriousBroker.it  This test is  not yet approved or cleared by the [place]  FDA and has been authorized for detection and/or diagnosis of SARS-CoV-2 by FDA under an Emergency Use Authorization (EUA). This EUA will remain in effect (meaning this test can be used) for the duration of the COVID-19 declaration under Section 564(b)(1) of the Act, 21 U.S.C. section 360bbb-3(b)(1), unless the authorization is terminated or revoked.  Performed at [facility], [address], [place], [place] [address]   Blood Culture (routine x 2)     Status: None   Collection Time: 04/19/24  2:04 PM   Specimen: BLOOD  Result Value Ref Range Status   Specimen Description   Final    BLOOD RIGHT ANTECUBITAL Performed at [facility], [address] [place], [place] [address]    Special Requests   Final    BOTTLES DRAWN AEROBIC AND ANAEROBIC Blood Culture results may not be optimal due to an inadequate volume of blood received in culture bottles Performed at [facility], [address], [place], [place] [address]    Culture   Final    NO GROWTH 5 DAYS Performed at [facility], [address] [place], [place] [address]    Report Status 04/24/2024 FINAL  Final  Blood Culture (routine x 2)     Status: None   Collection Time: 04/19/24  2:26 PM   Specimen: BLOOD  Result Value Ref Range Status   Specimen Description   Final    BLOOD LEFT ANTECUBITAL Performed at [facility], [address] [place], [place] [address]    Special Requests   Final    BOTTLES DRAWN AEROBIC AND ANAEROBIC Blood Culture results may not be optimal due to an inadequate volume of blood received in culture bottles Performed at [facility], [address] [place], [place] [address]    Culture   Final    NO GROWTH 5 DAYS Performed at [facility], [address], [place], [place] [address]    Report Status 04/24/2024 FINAL  Final  Respiratory (~20  pathogens) panel by PCR     Status: Abnormal   Collection Time: 04/19/24 11:09 PM   Specimen: Nasal Mucosa; Respiratory  Result Value Ref Range Status   Adenovirus NOT DETECTED NOT DETECTED Final   Coronavirus 229E NOT DETECTED NOT DETECTED Final    Comment: (NOTE) The Coronavirus on the Respiratory Panel, DOES NOT test for the novel  Coronavirus (2019 nCoV)    Coronavirus HKU1 NOT DETECTED NOT DETECTED Final   Coronavirus NL63 NOT DETECTED NOT DETECTED Final   Coronavirus OC43 NOT DETECTED NOT DETECTED Final   Metapneumovirus NOT DETECTED NOT DETECTED Final   Rhinovirus / Enterovirus DETECTED (A) NOT DETECTED Final   Influenza A NOT DETECTED NOT DETECTED Final   Influenza B NOT DETECTED NOT DETECTED Final   Parainfluenza Virus 1 NOT DETECTED NOT DETECTED Final   Parainfluenza Virus 2 NOT DETECTED NOT DETECTED Final   Parainfluenza Virus 3 NOT DETECTED NOT DETECTED Final   Parainfluenza Virus 4 NOT DETECTED NOT DETECTED Final   Respiratory Syncytial Virus NOT DETECTED NOT DETECTED Final   Bordetella pertussis NOT DETECTED NOT DETECTED Final   Bordetella Parapertussis NOT DETECTED NOT DETECTED Final  Chlamydophila pneumoniae NOT DETECTED NOT DETECTED Final   Mycoplasma pneumoniae NOT DETECTED NOT DETECTED Final    Comment: Performed at [facility], [address], [place], [place] [address]  MRSA Next Gen by PCR, Nasal     Status: None   Collection Time: 04/19/24 11:09 PM   Specimen: Nasal Mucosa; Nasal Swab  Result Value Ref Range Status   MRSA by PCR Next Gen NOT DETECTED NOT DETECTED Final    Comment: (NOTE) The GeneXpert MRSA Assay (FDA approved for NASAL specimens only), is one component of a comprehensive MRSA colonization surveillance program. It is not intended to diagnose MRSA infection nor to guide or monitor treatment for MRSA infections. Test performance is not FDA approved in patients less than 36 years old. Performed at [facility], [address], [place], [place] [address]   Body fluid culture w Gram Stain     Status: None   Collection Time: 04/20/24 11:47 AM   Specimen: Pleural Fluid  Result Value Ref Range Status   Specimen Description   Final    FLUID RIGHT PLEURAL Performed at [facility], [address] [place], [place] [address]    Special Requests   Final    NONE Performed at [facility], [address], [place], [place] [address]    Gram Stain   Final    ABUNDANT WBC PRESENT, PREDOMINANTLY MONONUCLEAR NO ORGANISMS SEEN    Culture   Final    NO GROWTH 3 DAYS Performed at [facility], [address], [place], [place] [address]    Report Status 04/23/2024 FINAL  Final  Culture, BAL-quantitative w Gram Stain     Status: None   Collection Time: 04/22/24 12:16 PM   Specimen: Bronchial Alveolar Lavage; Respiratory  Result Value Ref Range Status   Specimen Description   Final    BRONCHIAL ALVEOLAR LAVAGE Performed at [facility], [address], [place], [place] [address]    Special Requests   Final    NONE Performed at [facility], [address], [place], [place] [address]    Gram Stain NO WBC SEEN NO ORGANISMS SEEN   Final   Culture   Final    NO GROWTH 2 DAYS Performed at [facility], [address], [place], [place] [address]    Report Status 04/25/2024 FINAL  Final  Aerobic/Anaerobic Culture w Gram Stain (surgical/deep wound)     Status: None (Preliminary result)   Collection Time: 04/22/24 12:16 PM   Specimen: Bronchial Alveolar Lavage; Respiratory  Result Value Ref Range Status   Specimen Description   Final    BRONCHIAL ALVEOLAR LAVAGE Performed at [facility], [address], [place], [place] [address]    Special Requests   Final    NONE Performed at [facility], [address] [place], [place] [address]    Gram Stain NO WBC SEEN NO ORGANISMS  SEEN   Final   Culture   Final    NO GROWTH 3 DAYS NO ANAEROBES ISOLATED; CULTURE IN PROGRESS FOR 5 DAYS Performed at [facility], [address] [place], [place] [address]    Report Status PENDING  Incomplete  Acid Fast Smear (AFB)     Status: None   Collection Time: 04/22/24 12:16 PM   Specimen: Bronchial Alveolar Lavage; Respiratory  Result Value Ref Range Status   AFB Specimen Processing Concentration  Final   Acid Fast Smear Negative  Final    Comment: (NOTE) Performed At: [facility] [address] [place], [place] [id number] Pearlean Botts MD WU:[id number]    Source (AFB) BRONCHIAL ALVEOLAR LAVAGE  Final    Comment: Performed at [facility], [address], [place], [place] [address]      Radiology Studies: No results found.     LOS: 6 days    Aneita Keens, MD [facility] 04/25/2024, 2:12 PM   If 7PM-7AM, please contact night-coverage [email or web address]

## 2024-04-25 NOTE — Significant Event (Signed)
 Pt stated that he couldn't catch his breath after trying to walk to the bathroom. Rapid Response was called and pt was put on a high flow nasal cannula and put on 6lpm of O2. Pt quickly felt better and VS stayed WNL during episode. MD aware.

## 2024-04-26 ENCOUNTER — Encounter (HOSPITAL_COMMUNITY): Payer: Self-pay | Admitting: Emergency Medicine

## 2024-04-26 DIAGNOSIS — J9621 Acute and chronic respiratory failure with hypoxia: Secondary | ICD-10-CM | POA: Diagnosis not present

## 2024-04-26 DIAGNOSIS — E1140 Type 2 diabetes mellitus with diabetic neuropathy, unspecified: Secondary | ICD-10-CM | POA: Diagnosis not present

## 2024-04-26 DIAGNOSIS — I10 Essential (primary) hypertension: Secondary | ICD-10-CM | POA: Diagnosis not present

## 2024-04-26 DIAGNOSIS — J189 Pneumonia, unspecified organism: Secondary | ICD-10-CM | POA: Diagnosis not present

## 2024-04-26 LAB — GLUCOSE, CAPILLARY
Glucose-Capillary: 164 mg/dL — ABNORMAL HIGH (ref 70–99)
Glucose-Capillary: 364 mg/dL — ABNORMAL HIGH (ref 70–99)
Glucose-Capillary: 428 mg/dL — ABNORMAL HIGH (ref 70–99)
Glucose-Capillary: 361 mg/dL — ABNORMAL HIGH (ref 70–99)

## 2024-04-26 MED ORDER — INSULIN ASPART 100 UNIT/ML IJ SOLN
0.0000 [IU] | Freq: Three times a day (TID) | INTRAMUSCULAR | Status: DC
  Administered 2024-04-26 – 2024-04-27 (×2): 20 [IU] via SUBCUTANEOUS
  Administered 2024-04-27: 7 [IU] via SUBCUTANEOUS
  Administered 2024-04-27: 4 [IU] via SUBCUTANEOUS
  Administered 2024-04-28: 20 [IU] via SUBCUTANEOUS
  Administered 2024-04-28: 7 [IU] via SUBCUTANEOUS
  Administered 2024-04-28 – 2024-04-29 (×2): 4 [IU] via SUBCUTANEOUS
  Administered 2024-04-29: 11 [IU] via SUBCUTANEOUS
  Administered 2024-04-30: 15 [IU] via SUBCUTANEOUS
  Administered 2024-04-30 – 2024-05-02 (×3): 4 [IU] via SUBCUTANEOUS

## 2024-04-26 NOTE — Inpatient Diabetes Management (Signed)
 Inpatient Diabetes Program Recommendations  AACE/ADA: New Consensus Statement on Inpatient Glycemic Control (2015)  Target Ranges:  Prepandial:   less than 140 mg/dL      Peak postprandial:   less than 180 mg/dL (1-2 hours)      Critically ill patients:  140 - 180 mg/dL   Lab Results  Component Value Date   GLUCAP 164 (H) 04/26/2024   HGBA1C 7.8 (H) 04/03/2024    Review of Glycemic Control  Latest Reference Range & Units 04/25/24 07:43 04/25/24 11:38 04/25/24 16:19 04/25/24 21:55 04/26/24 07:37  Glucose-Capillary 70 - 99 mg/dL 914 (H) 782 (H) 956 (H) 256 (H) 164 (H)   Diabetes history: DM 2 Outpatient Diabetes medications:  Semaglutide  2 mg weekly Pred taper Jardiance  25 mg q HS Lantus  20-30 units q HS Current orders for Inpatient glycemic control:  Novolog  0-20 units tid with meals and HS Semglee  25 units q HS  Inpatient Diabetes Program Recommendations:    May consider adding Novolog  meal coverage 6 units tid with meals (hold if patient eats less than 50% or NPO).  Also consider changing bedtime scale to (0-5 units at bedtime).   Thanks  Josefa Ni, RN, BC-ADM Inpatient Diabetes Coordinator Pager [phone]  (8a-5p)

## 2024-04-26 NOTE — Progress Notes (Signed)
 Physical Therapy Treatment Patient Details Name: Jose Patrick MRN: [id number] DOB: 1951/07/09 Today's Date: 04/26/2024   History of Present Illness 73 yo M  who presented to [facility] ED 6/2 for eval of persistent cough, suspected PNA. 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 Recent admission 5/15/-04/07/24 for PNA.    PT Comments  Pt ambulated 110' with RW and 6L O2, SpO2 93% while walking. Pt required 4 standing rest breaks 2* 3/4 dyspnea. Instructed pt in seated BLE strengthening exercises and encouraged him to perform these independently.     If plan is discharge home, recommend the following: Assistance with cooking/housework;Help with stairs or ramp for entrance   Can travel by private vehicle        Equipment Recommendations  None recommended by PT    Recommendations for Other Services       Precautions / Restrictions Precautions Precautions: Fall Precaution/Restrictions Comments: O2 dep @ baseline Restrictions Weight Bearing Restrictions Per Provider Order: No     Mobility  Bed Mobility               General bed mobility comments: up in recliner    Transfers Overall transfer level: Needs assistance Equipment used: Rolling walker (2 wheels) Transfers: Sit to/from Stand Sit to Stand: Supervision           General transfer comment: VCs hand placement    Ambulation/Gait Ambulation/Gait assistance: Contact guard assist Gait Distance (Feet): 110 Feet Assistive device: Rolling walker (2 wheels) Gait Pattern/deviations: Step-through pattern, Decreased stride length Gait velocity: decreased     General Gait Details: 4 standing rest breaks 2* 3/4 dyspnea, SpO2 93% on 6L O2 walking   Stairs             Wheelchair Mobility     Tilt Bed    Modified Rankin (Stroke Patients Only)       Balance Overall balance assessment: Needs assistance Sitting-balance support: No upper extremity supported Sitting balance-Leahy Scale:  Normal     Standing balance support: No upper extremity supported, Single extremity supported Standing balance-Leahy Scale: Fair                              Hotel manager: No apparent difficulties  Cognition Arousal: Alert Behavior During Therapy: WFL for tasks assessed/performed   PT - Cognitive impairments: No apparent impairments                         Following commands: Intact      Cueing Cueing Techniques: Verbal cues  Exercises General Exercises - Lower Extremity Ankle Circles/Pumps: AROM, Both, 10 reps, Supine Long Arc Quad: AROM, Both, 10 reps, Seated Hip Flexion/Marching: AROM, Both, 10 reps, Seated    General Comments        Pertinent Vitals/Pain Pain Assessment Pain Assessment: No/denies pain Faces Pain Scale: No hurt    Home Living                          Prior Function            PT Goals (current goals can now be found in the care plan section) Acute Rehab PT Goals Patient Stated Goal: continue to get better and get home, likes to sit on front porch in rocking chair PT Goal Formulation: With patient Time For Goal Achievement: 05/07/24 Potential to Achieve Goals: Good  Progress towards PT goals: Progressing toward goals    Frequency    Min 3X/week      PT Plan      Co-evaluation              AM-PAC PT "6 Clicks" Mobility   Outcome Measure  Help needed turning from your back to your side while in a flat bed without using bedrails?: A Little Help needed moving from lying on your back to sitting on the side of a flat bed without using bedrails?: A Little Help needed moving to and from a bed to a chair (including a wheelchair)?: A Little Help needed standing up from a chair using your arms (e.g., wheelchair or bedside chair)?: A Little Help needed to walk in hospital room?: A Little Help needed climbing 3-5 steps with a railing? : A Little 6 Click Score: 18    End of  Session Equipment Utilized During Treatment: Oxygen Activity Tolerance: Patient tolerated treatment well Patient left: in chair;with call bell/phone within reach;with chair alarm set Nurse Communication: Mobility status PT Visit Diagnosis: Unsteadiness on feet (R26.81)     Time: 1610-9604 PT Time Calculation (min) (ACUTE ONLY): 19 min  Charges:    $Gait Training: 8-22 mins PT General Charges $$ ACUTE PT VISIT: 1 Visit                     Daymon Evans PT 04/26/2024  Acute Rehabilitation Services  Office [phone]

## 2024-04-26 NOTE — Plan of Care (Signed)
  Problem: Education: Goal: Individualized Educational Video(s) Outcome: Progressing   Problem: Fluid Volume: Goal: Ability to maintain a balanced intake and output will improve Outcome: Progressing   Problem: Health Behavior/Discharge Planning: Goal: Ability to identify and utilize available resources and services will improve Outcome: Progressing Goal: Ability to manage health-related needs will improve Outcome: Progressing   Problem: Metabolic: Goal: Ability to maintain appropriate glucose levels will improve Outcome: Progressing   Problem: Nutritional: Goal: Maintenance of adequate nutrition will improve Outcome: Progressing Goal: Progress toward achieving an optimal weight will improve Outcome: Progressing   Problem: Skin Integrity: Goal: Risk for impaired skin integrity will decrease Outcome: Progressing   Problem: Clinical Measurements: Goal: Ability to maintain a body temperature in the normal range will improve Outcome: Progressing   Problem: Respiratory: Goal: Ability to maintain a clear airway will improve Outcome: Progressing   Problem: Education: Goal: Knowledge of General Education information will improve Description: Including pain rating scale, medication(s)/side effects and non-pharmacologic comfort measures Outcome: Progressing   Problem: Health Behavior/Discharge Planning: Goal: Ability to manage health-related needs will improve Outcome: Progressing   Problem: Clinical Measurements: Goal: Cardiovascular complication will be avoided Outcome: Progressing   Problem: Nutrition: Goal: Adequate nutrition will be maintained Outcome: Progressing   Problem: Elimination: Goal: Will not experience complications related to bowel motility Outcome: Progressing Goal: Will not experience complications related to urinary retention Outcome: Progressing   Problem: Pain Managment: Goal: General experience of comfort will improve and/or be  controlled Outcome: Progressing   Problem: Safety: Goal: Ability to remain free from injury will improve Outcome: Progressing   Problem: Skin Integrity: Goal: Risk for impaired skin integrity will decrease Outcome: Progressing   Problem: Coping: Goal: Ability to adjust to condition or change in health will improve Outcome: Not Progressing   Problem: Activity: Goal: Ability to tolerate increased activity will improve Outcome: Not Progressing   Problem: Respiratory: Goal: Ability to maintain adequate ventilation will improve Outcome: Not Progressing   Problem: Clinical Measurements: Goal: Ability to maintain clinical measurements within normal limits will improve Outcome: Not Progressing Goal: Will remain free from infection Outcome: Not Progressing Goal: Diagnostic test results will improve Outcome: Not Progressing Goal: Respiratory complications will improve Outcome: Not Progressing   Problem: Activity: Goal: Risk for activity intolerance will decrease Outcome: Not Progressing   Problem: Coping: Goal: Level of anxiety will decrease Outcome: Not Progressing

## 2024-04-26 NOTE — Progress Notes (Addendum)
 PROGRESS NOTE    Joquan Lotz  WUJ:[id number] DOB: 07-29-1951 DOA: 04/19/2024 PCP: [name]   Brief Narrative: Alexandru Moorer is a 73 y.o. male with a history of BPH, bipolar disorder, constipation, diabetes mellitus type 2, hyperlipidemia, PTSD, lumbar spinal stenosis.  Patient presented secondary to shortness of breath with evidence of recurrent pneumonia, rhinovirus positive, in addition to acute on chronic hypoxia. Pulmonology consulted. Bronchoscopy performed on 6/5.   Assessment and Plan:  Acute on chronic respiratory failure with hypoxia Baseline 2 L/min of oxygen. Requiring up to 4 L/min this admission. CT evidence of right lower lung airspce disease in addition to developing diffuse nodular infiltration throught the left lung. Possible sequela from recurrent pneumonia vs radiation pneumonitis vs organizing pneumonia. Also concern for persistent cancer with lymphangitic spread. Pulmonology is consulted. Patient started empirically on steroids, Cefepime  and doxycycline . Bronchoscopy performed on 6/5, which does not suggest infection; pathology pending. Weaned to 3 L/min but had an episode of respiratory distress on 6/8 with need to increase up to 6 L/min of oxygen. BAL culture no growth to date. Cytology pending. -Continue prednisone  with taper -Pulmonology recommendations: prednisone , follow-up culture data and cytology -Continue PPI -Wean to baseline 2 L/min as able.  Rhinovirus pneumonia Noted. CT imaging suggesting pneumonia. -Continue supportive care  Hemoptysis In setting of recent bronchscopy. Appears to be slightly worsened. Aspirin  and Lovenox  still held. Discussed with pulmonology and patient received one dose of tranexamic acid via nebulization. Patient with continued hemoptysis today. -Follow-up pulmonology recommendations  Bilateral pleural effusions Noted on CT imaging. Right greater than left.  Lung cancer, recurrent History of remote  left upper lobe lobectomy and concern for right lower lobe lung cancer s/p recent radiation. Concern for lymphangitic spread.  Diabetes mellitus type 2 with neuropathy Diabetes is well controlled for age with hemoglobin A1C of 7.8%. Patient is on solumedrol for management of respiratory failure. -Continue Semglee  and SSI  Primary hypertension -Continue Toprol  XL  CAD Hyperlipidemia -Continue Lipitor, Zetia , aspirin  (held for bronchoscopy) and Ranexa   GERD -Continue Protonix   OSA -Continue CPAP at bedtime  Chronic constipation -Continue MiraLAX   BPH Patient with lower urinary tract symptoms. Patient started on finasteride . -Continue finasteride   PTSD Bipolar disorder Noted. Not currently on medication therapy.  Lumbar stenosis Noted.  Morbid obesity, class II Estimated body mass index is 30 kg/m as calculated from the following:   Height as of this encounter: 5\' 8"  (1.727 m).   Weight as of this encounter: 89.5 kg.   DVT prophylaxis: Lovenox  (held for bronchoscopy and hemoptysis). SCDs. Code Status:   Code Status: Full Code Family Communication: None at bedside Disposition Plan: Discharge home pending improvement of hemoptysis, ability to wean oxygen, pulmonology recommendations   Consultants:  Pulmonology  Procedures:  None  Antimicrobials: Doxycycline   Cefepime    Subjective: Still with hemoptysis and cough. Feels better than yesterday.  Objective: BP (!) 113/58 (BP Location: Left Arm)   Pulse 61   Temp 97.6 F (36.4 C)   Resp 19   Ht 5\' 8"  (1.727 m)   Wt 89.5 kg   SpO2 98%   BMI 30.00 kg/m   Examination:  General exam: Appears calm and comfortable Respiratory system: Mostly clear to auscultation with very faint wheezing. Respiratory effort normal. Cardiovascular system: S1 & S2 heard, RRR. Gastrointestinal system: Abdomen is nondistended, soft and nontender. Normal bowel sounds heard. Central nervous system: Alert and oriented. No focal  neurological deficits. Psychiatry: Judgement and insight appear normal. Mood &  affect appropriate.    Data Reviewed: I have personally reviewed following labs and imaging studies  CBC Lab Results  Component Value Date   WBC 10.6 (H) 04/25/2024   RBC 5.80 04/25/2024   HGB 15.4 04/25/2024   HCT 49.2 04/25/2024   MCV 84.8 04/25/2024   MCH 26.6 04/25/2024   PLT 362 04/25/2024   MCHC 31.3 04/25/2024   RDW 15.7 (H) 04/25/2024   LYMPHSABS 1.4 04/21/2024   MONOABS 0.8 04/21/2024   EOSABS 0.1 04/21/2024   BASOSABS 0.0 04/21/2024     Last metabolic panel Lab Results  Component Value Date   NA 134 (L) 04/23/2024   K 4.5 04/23/2024   CL 102 04/23/2024   CO2 23 04/23/2024   BUN 24 (H) 04/23/2024   CREATININE 1.03 04/23/2024   GLUCOSE 370 (H) 04/23/2024   GFRNONAA >60 04/23/2024   GFRAA  02/23/2009    >60        The eGFR has been calculated using the MDRD equation. This calculation has not been validated in all clinical situations. eGFR's persistently <60 mL/min signify possible Chronic Kidney Disease.   CALCIUM  9.2 04/23/2024   PROT 5.7 (L) 04/20/2024   ALBUMIN 2.6 (L) 04/20/2024   BILITOT 0.6 04/20/2024   ALKPHOS 47 04/20/2024   AST 18 04/20/2024   ALT 27 04/20/2024   ANIONGAP 9 04/23/2024    GFR: Estimated Creatinine Clearance: 70.4 mL/min (by C-G formula based on SCr of 1.03 mg/dL).  Recent Results (from the past 240 hours)  Resp panel by RT-PCR (RSV, Flu A&B, Covid) Anterior Nasal Swab     Status: None   Collection Time: 04/19/24  1:55 PM   Specimen: Anterior Nasal Swab  Result Value Ref Range Status   SARS Coronavirus 2 by RT PCR NEGATIVE NEGATIVE Final    Comment: (NOTE) SARS-CoV-2 target nucleic acids are NOT DETECTED.  The SARS-CoV-2 RNA is generally detectable in upper respiratory specimens during the acute phase of infection. The lowest concentration of SARS-CoV-2 viral copies this assay can detect is 138 copies/mL. A negative result does not  preclude SARS-Cov-2 infection and should not be used as the sole basis for treatment or other patient management decisions. A negative result may occur with  improper specimen collection/handling, submission of specimen other than nasopharyngeal swab, presence of viral mutation(s) within the areas targeted by this assay, and inadequate number of viral copies(<138 copies/mL). A negative result must be combined with clinical observations, patient history, and epidemiological information. The expected result is Negative.  Fact Sheet for Patients:  BloggerCourse.com  Fact Sheet for Healthcare Providers:  SeriousBroker.it  This test is no t yet approved or cleared by the [place]  FDA and  has been authorized for detection and/or diagnosis of SARS-CoV-2 by FDA under an Emergency Use Authorization (EUA). This EUA will remain  in effect (meaning this test can be used) for the duration of the COVID-19 declaration under Section 564(b)(1) of the Act, 21 U.S.C.section 360bbb-3(b)(1), unless the authorization is terminated  or revoked sooner.       Influenza A by PCR NEGATIVE NEGATIVE Final   Influenza B by PCR NEGATIVE NEGATIVE Final    Comment: (NOTE) The Xpert Xpress SARS-CoV-2/FLU/RSV plus assay is intended as an aid in the diagnosis of influenza from Nasopharyngeal swab specimens and should not be used as a sole basis for treatment. Nasal washings and aspirates are unacceptable for Xpert Xpress SARS-CoV-2/FLU/RSV testing.  Fact Sheet for Patients: BloggerCourse.com  Fact Sheet for Healthcare Providers: SeriousBroker.it  This test is not yet approved or cleared by the [place]  FDA and has been authorized for detection and/or diagnosis of SARS-CoV-2 by FDA under an Emergency Use Authorization (EUA). This EUA will remain in effect (meaning this test can be used) for the  duration of the COVID-19 declaration under Section 564(b)(1) of the Act, 21 U.S.C. section 360bbb-3(b)(1), unless the authorization is terminated or revoked.     Resp Syncytial Virus by PCR NEGATIVE NEGATIVE Final    Comment: (NOTE) Fact Sheet for Patients: BloggerCourse.com  Fact Sheet for Healthcare Providers: SeriousBroker.it  This test is not yet approved or cleared by the [place]  FDA and has been authorized for detection and/or diagnosis of SARS-CoV-2 by FDA under an Emergency Use Authorization (EUA). This EUA will remain in effect (meaning this test can be used) for the duration of the COVID-19 declaration under Section 564(b)(1) of the Act, 21 U.S.C. section 360bbb-3(b)(1), unless the authorization is terminated or revoked.  Performed at [facility], [address] [place], [place] [address]   Blood Culture (routine x 2)     Status: None   Collection Time: 04/19/24  2:04 PM   Specimen: BLOOD  Result Value Ref Range Status   Specimen Description   Final    BLOOD RIGHT ANTECUBITAL Performed at [facility], [address], [place], [place] [address]    Special Requests   Final    BOTTLES DRAWN AEROBIC AND ANAEROBIC Blood Culture results may not be optimal due to an inadequate volume of blood received in culture bottles Performed at [facility], [address], [place], [place] [address]    Culture   Final    NO GROWTH 5 DAYS Performed at [facility], [address], [place], [place] [address]    Report Status 04/24/2024 FINAL  Final  Blood Culture (routine x 2)     Status: None   Collection Time: 04/19/24  2:26 PM   Specimen: BLOOD  Result Value Ref Range Status   Specimen Description   Final    BLOOD LEFT ANTECUBITAL Performed at [facility], [address] [place], [place] [address]    Special Requests   Final     BOTTLES DRAWN AEROBIC AND ANAEROBIC Blood Culture results may not be optimal due to an inadequate volume of blood received in culture bottles Performed at [facility], [address], [place], [place] [address]    Culture   Final    NO GROWTH 5 DAYS Performed at [facility], [address] [place], [place] [address]    Report Status 04/24/2024 FINAL  Final  Respiratory (~20 pathogens) panel by PCR     Status: Abnormal   Collection Time: 04/19/24 11:09 PM   Specimen: Nasal Mucosa; Respiratory  Result Value Ref Range Status   Adenovirus NOT DETECTED NOT DETECTED Final   Coronavirus 229E NOT DETECTED NOT DETECTED Final    Comment: (NOTE) The Coronavirus on the Respiratory Panel, DOES NOT test for the novel  Coronavirus (2019 nCoV)    Coronavirus HKU1 NOT DETECTED NOT DETECTED Final   Coronavirus NL63 NOT DETECTED NOT DETECTED Final   Coronavirus OC43 NOT DETECTED NOT DETECTED Final   Metapneumovirus NOT DETECTED NOT DETECTED Final   Rhinovirus / Enterovirus DETECTED (A) NOT DETECTED Final   Influenza A NOT DETECTED NOT DETECTED Final   Influenza B NOT DETECTED NOT DETECTED Final   Parainfluenza Virus 1 NOT DETECTED  NOT DETECTED Final   Parainfluenza Virus 2 NOT DETECTED NOT DETECTED Final   Parainfluenza Virus 3 NOT DETECTED NOT DETECTED Final   Parainfluenza Virus 4 NOT DETECTED NOT DETECTED Final   Respiratory Syncytial Virus NOT DETECTED NOT DETECTED Final   Bordetella pertussis NOT DETECTED NOT DETECTED Final   Bordetella Parapertussis NOT DETECTED NOT DETECTED Final   Chlamydophila pneumoniae NOT DETECTED NOT DETECTED Final   Mycoplasma pneumoniae NOT DETECTED NOT DETECTED Final    Comment: Performed at [facility], [address], [place], [place] [address]  MRSA Next Gen by PCR, Nasal     Status: None   Collection Time: 04/19/24 11:09 PM   Specimen: Nasal Mucosa; Nasal Swab  Result Value Ref Range Status   MRSA by PCR Next Gen NOT DETECTED  NOT DETECTED Final    Comment: (NOTE) The GeneXpert MRSA Assay (FDA approved for NASAL specimens only), is one component of a comprehensive MRSA colonization surveillance program. It is not intended to diagnose MRSA infection nor to guide or monitor treatment for MRSA infections. Test performance is not FDA approved in patients less than 55 years old. Performed at [facility], [address] [place], [place] [address]   Body fluid culture w Gram Stain     Status: None   Collection Time: 04/20/24 11:47 AM   Specimen: Pleural Fluid  Result Value Ref Range Status   Specimen Description   Final    FLUID RIGHT PLEURAL Performed at [facility], [address], [place], [place] [address]    Special Requests   Final    NONE Performed at [facility], [address] [place], [place] [address]    Gram Stain   Final    ABUNDANT WBC PRESENT, PREDOMINANTLY MONONUCLEAR NO ORGANISMS SEEN    Culture   Final    NO GROWTH 3 DAYS Performed at [facility], [address] [place], [place] [address]    Report Status 04/23/2024 FINAL  Final  Culture, BAL-quantitative w Gram Stain     Status: None   Collection Time: 04/22/24 12:16 PM   Specimen: Bronchial Alveolar Lavage; Respiratory  Result Value Ref Range Status   Specimen Description   Final    BRONCHIAL ALVEOLAR LAVAGE Performed at [facility], [address], [place], [place] [address]    Special Requests   Final    NONE Performed at [facility], [address] [place], [place] [address]    Gram Stain NO WBC SEEN NO ORGANISMS SEEN   Final   Culture   Final    NO GROWTH 2 DAYS Performed at [facility], [address], [place], [place] [address]    Report Status 04/25/2024 FINAL  Final  Aerobic/Anaerobic Culture w Gram Stain (surgical/deep wound)     Status: None (Preliminary result)   Collection Time: 04/22/24 12:16 PM    Specimen: Bronchial Alveolar Lavage; Respiratory  Result Value Ref Range Status   Specimen Description   Final    BRONCHIAL ALVEOLAR LAVAGE Performed at [facility], [address] [place], [place] [address]    Special Requests   Final    NONE Performed at [facility], [address], [place], [place] [address]    Gram Stain NO WBC SEEN NO ORGANISMS SEEN   Final   Culture   Final    NO GROWTH 3 DAYS NO ANAEROBES ISOLATED; CULTURE IN  PROGRESS FOR 5 DAYS Performed at [facility], [address], [place], [place] [address]    Report Status PENDING  Incomplete  Acid Fast Smear (AFB)     Status: None   Collection Time: 04/22/24 12:16 PM   Specimen: Bronchial Alveolar Lavage; Respiratory  Result Value Ref Range Status   AFB Specimen Processing Concentration  Final   Acid Fast Smear Negative  Final    Comment: (NOTE) Performed At: [facility] [address] [place], [place] [id number] Pearlean Botts MD XB:[id number]    Source (AFB) BRONCHIAL ALVEOLAR LAVAGE  Final    Comment: Performed at [facility], [address], [place], [place] [address]      Radiology Studies: DG CHEST PORT 1 VIEW Result Date: 04/25/2024 CLINICAL DATA:  Increased dyspnea EXAM: PORTABLE CHEST 1 VIEW COMPARISON:  04/22/2024 FINDINGS: Stable cardiomediastinal silhouette. Diffuse bilateral reticulonodular opacities and interstitial coarsening prior. There is similar to increased airspace opacities in the right mid and lower lung. Layering right greater than left pleural effusions. No pneumothorax. No displaced rib fracture. IMPRESSION: 1. Similar to increased airspace and interstitial opacities greatest in the right lower lobe. 2. Layering right greater than left pleural effusions. Electronically Signed   By: Rozell Cornet M.D.   On: 04/25/2024 19:37       LOS: 7 days    Aneita Keens, MD [facility] 04/26/2024, 9:42  AM   If 7PM-7AM, please contact night-coverage [email or web address]

## 2024-04-27 DIAGNOSIS — R042 Hemoptysis: Secondary | ICD-10-CM | POA: Diagnosis not present

## 2024-04-27 DIAGNOSIS — I10 Essential (primary) hypertension: Secondary | ICD-10-CM | POA: Diagnosis not present

## 2024-04-27 DIAGNOSIS — J189 Pneumonia, unspecified organism: Secondary | ICD-10-CM | POA: Diagnosis not present

## 2024-04-27 DIAGNOSIS — J9621 Acute and chronic respiratory failure with hypoxia: Secondary | ICD-10-CM | POA: Diagnosis not present

## 2024-04-27 DIAGNOSIS — E1140 Type 2 diabetes mellitus with diabetic neuropathy, unspecified: Secondary | ICD-10-CM | POA: Diagnosis not present

## 2024-04-27 DIAGNOSIS — J9611 Chronic respiratory failure with hypoxia: Secondary | ICD-10-CM | POA: Diagnosis not present

## 2024-04-27 LAB — GLUCOSE, CAPILLARY
Glucose-Capillary: 169 mg/dL — ABNORMAL HIGH (ref 70–99)
Glucose-Capillary: 238 mg/dL — ABNORMAL HIGH (ref 70–99)
Glucose-Capillary: 404 mg/dL — ABNORMAL HIGH (ref 70–99)
Glucose-Capillary: 352 mg/dL — ABNORMAL HIGH (ref 70–99)

## 2024-04-27 LAB — AEROBIC/ANAEROBIC CULTURE W GRAM STAIN (SURGICAL/DEEP WOUND)
Culture: NO GROWTH
Gram Stain: NONE SEEN

## 2024-04-27 MED ORDER — CAPSAICIN 0.025 % EX CREA
TOPICAL_CREAM | Freq: Two times a day (BID) | CUTANEOUS | Status: DC
  Filled 2024-04-27: qty 60

## 2024-04-27 MED ORDER — TRANEXAMIC ACID FOR INHALATION
500.0000 mg | Freq: Once | RESPIRATORY_TRACT | Status: AC
  Administered 2024-04-27: 500 mg via RESPIRATORY_TRACT
  Filled 2024-04-27: qty 10

## 2024-04-27 NOTE — Progress Notes (Signed)
   04/27/24 0035  BiPAP/CPAP/SIPAP  BiPAP/CPAP/SIPAP Pt Type Adult  BiPAP/CPAP/SIPAP DREAMSTATIOND (bileval)  Mask Type Full face mask  Mask Size Large  Respiratory Rate 18 breaths/min  IPAP 10 cmH20  EPAP 5 cmH2O  Flow Rate 4 lpm  Patient Home Machine No  Patient Home Mask No  Patient Home Tubing No  Auto Titrate No  Press High Alarm 30 cmH2O  Press Low Alarm 5 cmH2O  Device Plugged into RED Power Outlet Yes

## 2024-04-27 NOTE — TOC Progression Note (Signed)
 Transition of Care [facility]) - Progression Note    Patient Details  Name: Jose Patrick MRN: [id number] Date of Birth: 23-Mar-1951  [facility]) CM/SW Contact  Jonni Nettle, LCSW Phone Number: 04/27/2024, 3:15 PM  Clinical Narrative:    CSW met with pt at bedside to discuss PT's recommendation of [facility] PT/OT services upon discharge. Pt is agreeable with beginning [facility] PT/OT upon discharge. Pt reports no agency preference. CSW sent referral to Amy with [facility]. TOC will continue to follow.   Expected Discharge Plan: Home/Self Care Barriers to Discharge: Continued Medical Work up  Expected Discharge Plan and Services In-house Referral: Clinical Social Work Discharge Planning Services: NA Post Acute Care Choice: NA Living arrangements for the past 2 months: Single Family Home                 DME Arranged: N/A DME Agency: NA       HH Arranged: NA HH Agency: NA         Social Determinants of Health (SDOH) Interventions SDOH Screenings   Food Insecurity: No Food Insecurity (04/20/2024)  Housing: Low Risk  (04/20/2024)  Transportation Needs: No Transportation Needs (04/20/2024)  Utilities: Not At Risk (04/20/2024)  Depression (PHQ2-9): Low Risk  (11/20/2021)  Financial Resource Strain: Low Risk  (12/02/2023)   Received from [facility]  Physical Activity: Unknown (03/17/2024)   Received from [facility]  Social Connections: Moderately Integrated (04/20/2024)  Stress: Stress Concern Present (03/08/2024)   [facility] from [facility]  Tobacco Use: Medium Risk (04/22/2024)    Readmission Risk Interventions    04/20/2024   11:08 AM 04/02/2024    1:08 PM  Readmission Risk Prevention Plan  Transportation Screening Complete Complete  PCP or Specialist Appt within 5-7 Days  Complete  PCP or Specialist Appt within 3-5 Days Complete   Home Care Screening  Complete  Medication Review (RN CM)  Complete  HRI or Home Care Consult Complete   Social Work  Consult for Recovery Care Planning/Counseling Complete   Palliative Care Screening Not Applicable   Medication Review Oceanographer) Complete

## 2024-04-27 NOTE — Plan of Care (Signed)
  Problem: Clinical Measurements: Goal: Ability to maintain a body temperature in the normal range will improve Outcome: Progressing   Problem: Education: Goal: Knowledge of General Education information will improve Description: Including pain rating scale, medication(s)/side effects and non-pharmacologic comfort measures Outcome: Progressing   Problem: Clinical Measurements: Goal: Will remain free from infection Outcome: Progressing   Problem: Nutrition: Goal: Adequate nutrition will be maintained Outcome: Progressing   Problem: Elimination: Goal: Will not experience complications related to bowel motility Outcome: Progressing   Problem: Pain Managment: Goal: General experience of comfort will improve and/or be controlled Outcome: Progressing   Problem: Skin Integrity: Goal: Risk for impaired skin integrity will decrease Outcome: Progressing

## 2024-04-27 NOTE — Progress Notes (Addendum)
 PROGRESS NOTE    Jose Patrick  WUJ:[id number] DOB: 1951-08-19 DOA: 04/19/2024 PCP: [name]   Brief Narrative: Jose Patrick is a 73 y.o. male with a history of BPH, bipolar disorder, constipation, diabetes mellitus type 2, hyperlipidemia, PTSD, lumbar spinal stenosis.  Patient presented secondary to shortness of breath with evidence of recurrent pneumonia, rhinovirus positive, in addition to acute on chronic hypoxia. Pulmonology consulted. Bronchoscopy performed on 6/5.   Assessment and Plan:  Acute on chronic respiratory failure with hypoxia Baseline 2 L/min of oxygen. Requiring up to 4 L/min this admission. CT evidence of right lower lung airspce disease in addition to developing diffuse nodular infiltration throught the left lung. Possible sequela from recurrent pneumonia vs radiation pneumonitis vs organizing pneumonia. Also concern for persistent cancer with lymphangitic spread. Pulmonology is consulted. Patient started empirically on steroids, Cefepime  and doxycycline . Bronchoscopy performed on 6/5, which does not suggest infection; pathology pending. Weaned to 3 L/min but had an episode of respiratory distress on 6/8 with need to increase up to 6 L/min of oxygen. BAL culture no growth to date. Cytology consistent with non-small cell lung cancer. -Continue prednisone  with taper -Pulmonology recommendations: pending today -Continue PPI -Wean to baseline 2 L/min as able.  Rhinovirus pneumonia Noted. CT imaging suggesting pneumonia. -Continue supportive care  Hemoptysis In setting of recent bronchscopy. Appears to be slightly worsened. Aspirin  and Lovenox  still held. Discussed with pulmonology and patient received one dose of tranexamic acid via nebulization. Patient with continued hemoptysis although there is some improvement. -Follow-up pulmonology recommendations: pending today  Bilateral pleural effusions Noted on CT imaging. Right greater than  left.  Lung cancer, recurrent History of remote left upper lobe lobectomy and concern for right lower lobe lung cancer s/p recent radiation. Concern for lymphangitic spread.  Bilateral toe pain Symptoms seem consistent with possible peripheral neuropathy. Confined to the tips. -Capsaicin cream  Diabetes mellitus type 2 with neuropathy Diabetes is well controlled for age with hemoglobin A1C of 7.8%. Patient is on solumedrol for management of respiratory failure. -Continue Semglee  and SSI  Primary hypertension -Continue Toprol  XL  CAD Hyperlipidemia -Continue Lipitor, Zetia , aspirin  (held for bronchoscopy) and Ranexa   GERD -Continue Protonix   OSA -Continue CPAP at bedtime  Chronic constipation -Continue MiraLAX   BPH Patient with lower urinary tract symptoms. Patient started on finasteride . -Continue finasteride   PTSD Bipolar disorder Noted. Not currently on medication therapy.  Lumbar stenosis Noted.  Morbid obesity, class II Estimated body mass index is 30 kg/m as calculated from the following:   Height as of this encounter: 5\' 8"  (1.727 m).   Weight as of this encounter: 89.5 kg.   DVT prophylaxis: Lovenox  (held for bronchoscopy and hemoptysis). SCDs. Code Status:   Code Status: Full Code Family Communication: None at bedside Disposition Plan: Discharge home pending improvement of hemoptysis, ability to wean oxygen, pulmonology recommendations   Consultants:  Pulmonology  Procedures:  Bronchoscopy  Antimicrobials: Doxycycline   Cefepime    Subjective: Patient reports stinging pain in the tip of some of his toes bilaterally.  Objective: BP (!) 127/56 (BP Location: Left Arm)   Pulse 61   Temp 97.9 F (36.6 C)   Resp 18   Ht 5\' 8"  (1.727 m)   Wt 89.5 kg   SpO2 94%   BMI 30.00 kg/m   Examination:  General exam: Appears calm and comfortable Respiratory system: Mild rales, mostly on right. Respiratory effort normal. Cardiovascular system: S1  & S2 heard, RRR. No murmurs. BLE pitting edema  Gastrointestinal system: Abdomen is protuberant, soft and nontender. Normal bowel sounds heard. Central nervous system: Alert and oriented. No focal neurological deficits. Musculoskeletal: No toe lesions noted. No evidence of arthropathy Psychiatry: Judgement and insight appear normal. Mood & affect appropriate.    Data Reviewed: I have personally reviewed following labs and imaging studies  CBC Lab Results  Component Value Date   WBC 10.6 (H) 04/25/2024   RBC 5.80 04/25/2024   HGB 15.4 04/25/2024   HCT 49.2 04/25/2024   MCV 84.8 04/25/2024   MCH 26.6 04/25/2024   PLT 362 04/25/2024   MCHC 31.3 04/25/2024   RDW 15.7 (H) 04/25/2024   LYMPHSABS 1.4 04/21/2024   MONOABS 0.8 04/21/2024   EOSABS 0.1 04/21/2024   BASOSABS 0.0 04/21/2024     Last metabolic panel Lab Results  Component Value Date   NA 134 (L) 04/23/2024   K 4.5 04/23/2024   CL 102 04/23/2024   CO2 23 04/23/2024   BUN 24 (H) 04/23/2024   CREATININE 1.03 04/23/2024   GLUCOSE 370 (H) 04/23/2024   GFRNONAA >60 04/23/2024   GFRAA  02/23/2009    >60        The eGFR has been calculated using the MDRD equation. This calculation has not been validated in all clinical situations. eGFR's persistently <60 mL/min signify possible Chronic Kidney Disease.   CALCIUM  9.2 04/23/2024   PROT 5.7 (L) 04/20/2024   ALBUMIN 2.6 (L) 04/20/2024   BILITOT 0.6 04/20/2024   ALKPHOS 47 04/20/2024   AST 18 04/20/2024   ALT 27 04/20/2024   ANIONGAP 9 04/23/2024    GFR: Estimated Creatinine Clearance: 70.4 mL/min (by C-G formula based on SCr of 1.03 mg/dL).  Recent Results (from the past 240 hours)  Resp panel by RT-PCR (RSV, Flu A&B, Covid) Anterior Nasal Swab     Status: None   Collection Time: 04/19/24  1:55 PM   Specimen: Anterior Nasal Swab  Result Value Ref Range Status   SARS Coronavirus 2 by RT PCR NEGATIVE NEGATIVE Final    Comment: (NOTE) SARS-CoV-2 target nucleic  acids are NOT DETECTED.  The SARS-CoV-2 RNA is generally detectable in upper respiratory specimens during the acute phase of infection. The lowest concentration of SARS-CoV-2 viral copies this assay can detect is 138 copies/mL. A negative result does not preclude SARS-Cov-2 infection and should not be used as the sole basis for treatment or other patient management decisions. A negative result may occur with  improper specimen collection/handling, submission of specimen other than nasopharyngeal swab, presence of viral mutation(s) within the areas targeted by this assay, and inadequate number of viral copies(<138 copies/mL). A negative result must be combined with clinical observations, patient history, and epidemiological information. The expected result is Negative.  Fact Sheet for Patients:  BloggerCourse.com  Fact Sheet for Healthcare Providers:  SeriousBroker.it  This test is no t yet approved or cleared by the [place]  FDA and  has been authorized for detection and/or diagnosis of SARS-CoV-2 by FDA under an Emergency Use Authorization (EUA). This EUA will remain  in effect (meaning this test can be used) for the duration of the COVID-19 declaration under Section 564(b)(1) of the Act, 21 U.S.C.section 360bbb-3(b)(1), unless the authorization is terminated  or revoked sooner.       Influenza A by PCR NEGATIVE NEGATIVE Final   Influenza B by PCR NEGATIVE NEGATIVE Final    Comment: (NOTE) The Xpert Xpress SARS-CoV-2/FLU/RSV plus assay is intended as an aid in the diagnosis of influenza from  Nasopharyngeal swab specimens and should not be used as a sole basis for treatment. Nasal washings and aspirates are unacceptable for Xpert Xpress SARS-CoV-2/FLU/RSV testing.  Fact Sheet for Patients: BloggerCourse.com  Fact Sheet for Healthcare Providers: SeriousBroker.it  This  test is not yet approved or cleared by the [place]  FDA and has been authorized for detection and/or diagnosis of SARS-CoV-2 by FDA under an Emergency Use Authorization (EUA). This EUA will remain in effect (meaning this test can be used) for the duration of the COVID-19 declaration under Section 564(b)(1) of the Act, 21 U.S.C. section 360bbb-3(b)(1), unless the authorization is terminated or revoked.     Resp Syncytial Virus by PCR NEGATIVE NEGATIVE Final    Comment: (NOTE) Fact Sheet for Patients: BloggerCourse.com  Fact Sheet for Healthcare Providers: SeriousBroker.it  This test is not yet approved or cleared by the [place]  FDA and has been authorized for detection and/or diagnosis of SARS-CoV-2 by FDA under an Emergency Use Authorization (EUA). This EUA will remain in effect (meaning this test can be used) for the duration of the COVID-19 declaration under Section 564(b)(1) of the Act, 21 U.S.C. section 360bbb-3(b)(1), unless the authorization is terminated or revoked.  Performed at [facility], [address] [place], [place] [address]   Blood Culture (routine x 2)     Status: None   Collection Time: 04/19/24  2:04 PM   Specimen: BLOOD  Result Value Ref Range Status   Specimen Description   Final    BLOOD RIGHT ANTECUBITAL Performed at [facility], [address], [place], [place] [address]    Special Requests   Final    BOTTLES DRAWN AEROBIC AND ANAEROBIC Blood Culture results may not be optimal due to an inadequate volume of blood received in culture bottles Performed at [facility], [address], [place], [place] [address]    Culture   Final    NO GROWTH 5 DAYS Performed at [facility], [address] [place], [place] [address]    Report Status 04/24/2024 FINAL  Final  Blood Culture (routine x 2)     Status: None   Collection Time:  04/19/24  2:26 PM   Specimen: BLOOD  Result Value Ref Range Status   Specimen Description   Final    BLOOD LEFT ANTECUBITAL Performed at [facility], [address] [place], [place] [address]    Special Requests   Final    BOTTLES DRAWN AEROBIC AND ANAEROBIC Blood Culture results may not be optimal due to an inadequate volume of blood received in culture bottles Performed at [facility], [address] [place], [place] [address]    Culture   Final    NO GROWTH 5 DAYS Performed at [facility], [address] [place], [place] [address]    Report Status 04/24/2024 FINAL  Final  Respiratory (~20 pathogens) panel by PCR     Status: Abnormal   Collection Time: 04/19/24 11:09 PM   Specimen: Nasal Mucosa; Respiratory  Result Value Ref Range Status   Adenovirus NOT DETECTED NOT DETECTED Final   Coronavirus 229E NOT DETECTED NOT DETECTED Final    Comment: (NOTE) The Coronavirus on the Respiratory Panel, DOES NOT test for the novel  Coronavirus (2019 nCoV)    Coronavirus HKU1 NOT DETECTED NOT DETECTED Final   Coronavirus NL63 NOT DETECTED NOT DETECTED Final   Coronavirus OC43 NOT DETECTED NOT DETECTED Final   Metapneumovirus NOT  DETECTED NOT DETECTED Final   Rhinovirus / Enterovirus DETECTED (A) NOT DETECTED Final   Influenza A NOT DETECTED NOT DETECTED Final   Influenza B NOT DETECTED NOT DETECTED Final   Parainfluenza Virus 1 NOT DETECTED NOT DETECTED Final   Parainfluenza Virus 2 NOT DETECTED NOT DETECTED Final   Parainfluenza Virus 3 NOT DETECTED NOT DETECTED Final   Parainfluenza Virus 4 NOT DETECTED NOT DETECTED Final   Respiratory Syncytial Virus NOT DETECTED NOT DETECTED Final   Bordetella pertussis NOT DETECTED NOT DETECTED Final   Bordetella Parapertussis NOT DETECTED NOT DETECTED Final   Chlamydophila pneumoniae NOT DETECTED NOT DETECTED Final   Mycoplasma pneumoniae NOT DETECTED NOT DETECTED Final    Comment: Performed at  [facility], [address], [place], [place] [address]  MRSA Next Gen by PCR, Nasal     Status: None   Collection Time: 04/19/24 11:09 PM   Specimen: Nasal Mucosa; Nasal Swab  Result Value Ref Range Status   MRSA by PCR Next Gen NOT DETECTED NOT DETECTED Final    Comment: (NOTE) The GeneXpert MRSA Assay (FDA approved for NASAL specimens only), is one component of a comprehensive MRSA colonization surveillance program. It is not intended to diagnose MRSA infection nor to guide or monitor treatment for MRSA infections. Test performance is not FDA approved in patients less than 66 years old. Performed at [facility], [address] [place], [place] [address]   Body fluid culture w Gram Stain     Status: None   Collection Time: 04/20/24 11:47 AM   Specimen: Pleural Fluid  Result Value Ref Range Status   Specimen Description   Final    FLUID RIGHT PLEURAL Performed at [facility], [address], [place], [place] [address]    Special Requests   Final    NONE Performed at [facility], [address] [place], [place] [address]    Gram Stain   Final    ABUNDANT WBC PRESENT, PREDOMINANTLY MONONUCLEAR NO ORGANISMS SEEN    Culture   Final    NO GROWTH 3 DAYS Performed at [facility], [address] [place], [place] [address]    Report Status 04/23/2024 FINAL  Final  Fungus Culture With Stain     Status: None (Preliminary result)   Collection Time: 04/22/24 12:16 PM   Specimen: Bronchial Alveolar Lavage; Respiratory  Result Value Ref Range Status   Fungus Stain Final report  Final    Comment: (NOTE) Performed At: [facility] [address] [place], [place] [id number] Pearlean Botts MD ON:[id number]    Fungus (Mycology) Culture PENDING  Incomplete   Fungal Source BRONCHIAL ALVEOLAR LAVAGE  Final    Comment: Performed at [facility], [address] [place], [place] [address]   Culture, BAL-quantitative w Gram Stain     Status: None   Collection Time: 04/22/24 12:16 PM   Specimen: Bronchial Alveolar Lavage; Respiratory  Result Value Ref Range Status   Specimen Description   Final    BRONCHIAL ALVEOLAR LAVAGE Performed at [facility], [address], [place], [place] [address]    Special Requests   Final    NONE Performed at [facility], [address], [place], [place] [address]    Gram Stain NO WBC SEEN NO ORGANISMS SEEN   Final   Culture   Final    NO GROWTH 2 DAYS Performed at [facility],  [address] [place], [place] [address]    Report Status 04/25/2024 FINAL  Final  Aerobic/Anaerobic Culture w Gram Stain (surgical/deep wound)     Status: None (Preliminary result)   Collection Time: 04/22/24 12:16 PM   Specimen: Bronchial Alveolar Lavage; Respiratory  Result Value Ref Range Status   Specimen Description   Final    BRONCHIAL ALVEOLAR LAVAGE Performed at [facility], [address] [place], [place] [address]    Special Requests   Final    NONE Performed at [facility], [address] [place], [place] [address]    Gram Stain NO WBC SEEN NO ORGANISMS SEEN   Final   Culture   Final    NO GROWTH 3 DAYS NO ANAEROBES ISOLATED; CULTURE IN PROGRESS FOR 5 DAYS Performed at [facility], [address] [place], [place] [address]    Report Status PENDING  Incomplete  Acid Fast Smear (AFB)     Status: None   Collection Time: 04/22/24 12:16 PM   Specimen: Bronchial Alveolar Lavage; Respiratory  Result Value Ref Range Status   AFB Specimen Processing Concentration  Final   Acid Fast Smear Negative  Final    Comment: (NOTE) Performed At: [facility] [address] [place], [place] [id number] Pearlean Botts MD MV:[id number]    Source (AFB) BRONCHIAL ALVEOLAR LAVAGE  Final    Comment: Performed at [facility], [address] [place], [place] [address]  Fungus Culture Result     Status: None   Collection Time: 04/22/24 12:16 PM  Result Value Ref Range Status   Result 1 Comment  Final    Comment: (NOTE) KOH/Calcofluor preparation:  no fungus observed. Performed At: [facility] [address] [place], [place] [id number] Pearlean Botts MD DG:[id number]       Radiology Studies: DG CHEST PORT 1 VIEW Result Date: 04/25/2024 CLINICAL DATA:  Increased dyspnea EXAM: PORTABLE CHEST 1 VIEW COMPARISON:  04/22/2024 FINDINGS: Stable cardiomediastinal silhouette. Diffuse bilateral reticulonodular opacities and interstitial coarsening prior. There is similar to increased airspace opacities in the right mid and lower lung. Layering right greater than left pleural effusions. No pneumothorax. No displaced rib fracture. IMPRESSION: 1. Similar to increased airspace and interstitial opacities greatest in the right lower lobe. 2. Layering right greater than left pleural effusions. Electronically Signed   By: Rozell Cornet M.D.   On: 04/25/2024 19:37       LOS: 8 days    Aneita Keens, MD [facility] 04/27/2024, 10:00 AM   If 7PM-7AM, please contact night-coverage [email or web address]

## 2024-04-27 NOTE — Progress Notes (Signed)
 NAME:  Jose Patrick, MRN:  [id number], DOB:  Jan 07, 1951, LOS: 8 ADMISSION DATE:  04/19/2024, CONSULTATION DATE:  04/19/24 REFERRING MD:  Bonita Bussing , CHIEF COMPLAINT:  cough    History of Present Illness:  73 yo M PMH COPD, lung cancer, chronic hypoxic resp failure on 2L who presented to [facility] ED 6/2 for eval of persistent cough, suspected PNA. Pt states this has been ongoing x 3 mo. Significantly worse at night and when he is laying down. Endorses hx acid reflux, doesn't know if he still takes anything for this. Does not notice any coughing when he eats/drinks, no difficulty swallowing.  Cough gets so bad that he gets chest pain and dizziness. Previously cough was productive and orange, now generally non-productive, sometimes white and bubbly. Has had several abx courses. Last seen by pulm 5/15 in office at which time ED presentation was recommended for IV abx -- admitted 5/15-5/21, 5d zosyn  + steroids.  In [facility] ED 6/2, CXR w R>L ASD  on [facility]. Started on doxy cefepime  Admitted to [facility] PCCM consulted in this setting   No leukocytosis or fever BMP w Cr 1.5  LA WNL  Pertinent  Medical History  COPD Lung cancer Chronic hypoxic resp failure  OSA intolerant of CPAP  DM2  CAD HTN  GERD  Significant Hospital Events: Including procedures, antibiotic start and stop dates in addition to other pertinent events   6/2 [facility] ED [facility] admit PCCM consult 6/3 right thoracentesis attempted, ECC obtained 6/5 bronchoscopy with right upper lobe BAL, right middle lobe and lower lobe brushing/biopsies 6/9 Biopsy results returned as non-small cell carcinoma  Interim History / Subjective:   Patient continues with cough and intermittent hemoptysis   Objective    Blood pressure (!) 127/56, pulse 61, temperature 97.9 F (36.6 C), resp. rate 18, height 5\' 8"  (1.727 m), weight 89.5 kg, SpO2 100%.       No intake or output data in the 24 hours ending 04/27/24 0803  Filed Weights   04/19/24 1141 04/23/24 0600   Weight: 105.2 kg 89.5 kg    Examination:  General: Laying in bed, eating breakfast, no distress on his O2 HENT: Hoarse voice, no secretions, no stridor Lungs: Bilateral inspiratory crackles right greater than left Cardiovascular: Regular no murmur Abdomen: Obese, nondistended, nontender Extremities: No deformities, no edema Neuro: Awake, alert, appropriate, follows commands  Resolved problem list   Assessment and Plan   Chronic Hypoxemic Respiratory Failure Non-Small Cell Lung Cancer, Right lung - All lobes Hx of Lung Cancer RLL s/p radiation 2024 and LUL lobectomy 2013 Hemoptysis Rhinovirus PNA  COPD OSA Progressive pulmonary filtrates prior to rhinovirus and has failed multiple courses of antibiotics. Significant airspace disease in the right lung. Positive cytology with non-small cell carcinoma from RUL, RML and RLL  P - Recommend oncology consult - Completed course of antibiotics - follow up cultures results from bronch BALs - give another TXA neb today - Cough suppression - continue nebulizer treatments - PPI as ordered - BiPAP nightly mandatory  PCCM will sign off.  Best Practice (right click and "Reselect all SmartList Selections" daily)   Per primary   Labs   CBC: Recent Labs  Lab 04/21/24 0334 04/23/24 1338 04/25/24 1314  WBC 12.4* 10.0 10.6*  NEUTROABS 10.2*  --   --   HGB 14.4 14.9 15.4  HCT 46.9 48.7 49.2  MCV 83.8 86.0 84.8  PLT 232 221 362    Basic Metabolic Panel: Recent Labs  Lab 04/21/24  1610 04/23/24 1338  NA 135 134*  K 4.4 4.5  CL 100 102  CO2 27 23  GLUCOSE 157* 370*  BUN 27* 24*  CREATININE 1.18 1.03  CALCIUM  8.9 9.2   GFR: Estimated Creatinine Clearance: 70.4 mL/min (by C-G formula based on SCr of 1.03 mg/dL). Recent Labs  Lab 04/21/24 0334 04/23/24 1338 04/25/24 1314  WBC 12.4* 10.0 10.6*    Liver Function Tests: No results for input(s): "AST", "ALT", "ALKPHOS", "BILITOT", "PROT", "ALBUMIN" in the last 168  hours.  No results for input(s): "LIPASE", "AMYLASE" in the last 168 hours. No results for input(s): "AMMONIA" in the last 168 hours.  ABG    Component Value Date/Time   TCO2 25 12/02/2023 1616     Coagulation Profile: No results for input(s): "INR", "PROTIME" in the last 168 hours.   Cardiac Enzymes: No results for input(s): "CKTOTAL", "CKMB", "CKMBINDEX", "TROPONINI" in the last 168 hours.  HbA1C: Hgb A1c MFr Bld  Date/Time Value Ref Range Status  04/03/2024 03:52 AM 7.8 (H) 4.8 - 5.6 % Final    Comment:    (NOTE) Pre diabetes:          5.7%-6.4%  Diabetes:              >6.4%  Glycemic control for   <7.0% adults with diabetes   09/30/2012 10:31 AM 7.6 (H) 4.6 - 6.5 % Final    Comment:    Glycemic Control Guidelines for People with Diabetes:Non Diabetic:  <6%Goal of Therapy: <7%Additional Action Suggested:  >8%     CBG: Recent Labs  Lab 04/26/24 0737 04/26/24 1141 04/26/24 1625 04/26/24 2019 04/27/24 0720  GLUCAP 164* 364* 428* 361* 169*    Duaine German, MD [facility] Office: [phone]   See Amion for personal pager PCCM on call pager [phone] until 7pm. Please call Elink 7p-7a. [phone]

## 2024-04-27 NOTE — Plan of Care (Signed)
  Problem: Coping: Goal: Ability to adjust to condition or change in health will improve Outcome: Progressing   Problem: Fluid Volume: Goal: Ability to maintain a balanced intake and output will improve Outcome: Progressing   Problem: Metabolic: Goal: Ability to maintain appropriate glucose levels will improve Outcome: Not Progressing

## 2024-04-28 ENCOUNTER — Ambulatory Visit: Admitting: Acute Care

## 2024-04-28 ENCOUNTER — Inpatient Hospital Stay (HOSPITAL_COMMUNITY)

## 2024-04-28 DIAGNOSIS — C3491 Malignant neoplasm of unspecified part of right bronchus or lung: Secondary | ICD-10-CM | POA: Diagnosis not present

## 2024-04-28 DIAGNOSIS — J9621 Acute and chronic respiratory failure with hypoxia: Secondary | ICD-10-CM | POA: Diagnosis not present

## 2024-04-28 DIAGNOSIS — J189 Pneumonia, unspecified organism: Secondary | ICD-10-CM | POA: Diagnosis not present

## 2024-04-28 LAB — GLUCOSE, CAPILLARY
Glucose-Capillary: 160 mg/dL — ABNORMAL HIGH (ref 70–99)
Glucose-Capillary: 226 mg/dL — ABNORMAL HIGH (ref 70–99)
Glucose-Capillary: 417 mg/dL — ABNORMAL HIGH (ref 70–99)
Glucose-Capillary: 317 mg/dL — ABNORMAL HIGH (ref 70–99)

## 2024-04-28 LAB — CYTOLOGY - NON PAP

## 2024-04-28 MED ORDER — INSULIN ASPART 100 UNIT/ML IJ SOLN
7.0000 [IU] | Freq: Three times a day (TID) | INTRAMUSCULAR | Status: DC
  Administered 2024-04-28 – 2024-05-02 (×10): 7 [IU] via SUBCUTANEOUS

## 2024-04-28 MED ORDER — INSULIN ASPART 100 UNIT/ML IJ SOLN: 10.0000 [IU] | Freq: Three times a day (TID) | INTRAMUSCULAR | Status: DC

## 2024-04-28 NOTE — Progress Notes (Signed)
 PROGRESS NOTE    [name]  ZOX:[id number] DOB: [birth year] DOA: 04/19/2024 PCP: [name]   Brief Narrative: Jose Patrick is a 73 y.o. male with a history of BPH, bipolar disorder, constipation, diabetes mellitus type 2, hyperlipidemia, PTSD, lumbar spinal stenosis.  Patient presented secondary to shortness of breath with evidence of recurrent pneumonia, rhinovirus positive, in addition to acute on chronic hypoxia. Pulmonology consulted. Bronchoscopy performed on 6/5.  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 Requiring up to 4 L/min this admission. CT evidence of right lower lung airspce disease in addition to developing diffuse nodular infiltration throught the left lung. Possible sequela from recurrent pneumonia vs radiation pneumonitis vs organizing pneumonia. Also concern for persistent cancer with lymphangitic spread. Pulmonology is consulted. Patient started empirically on steroids, Cefepime  and doxycycline . Bronchoscopy performed on 6/5, which does not suggest infection; pathology pending. Weaned to 3 L/min but had an episode of respiratory distress on 6/8 with need to increase up to 6 L/min of oxygen. BAL culture no growth to date. Cytology consistent with non-small cell lung cancer. -Continue prednisone  with taper -Pulmonology has signed off -Continue PPI -Wean to baseline 2 L/min as able. - Oncology Dr. Liam Redhead to see patient per Dr. Duard Getting  Rhinovirus pneumonia Noted. CT imaging suggesting pneumonia. -Continue supportive care  Hemoptysis In setting of recent bronchscopy. Appears to be slightly worsened. Aspirin  and Lovenox  still held. Discussed with pulmonology and patient received one dose of tranexamic acid via nebulization. Patient with continued hemoptysis although there is some improvement. Pulmonology signed off,  status post TXA neb  Bilateral pleural effusions Noted on CT imaging. Right greater than left.  Lung cancer, recurrent History of remote left upper lobe lobectomy and concern for right lower lobe lung cancer s/p recent radiation. Concern for lymphangitic spread.  Bilateral toe pain Symptoms seem consistent with possible peripheral neuropathy. Confined to the tips. -Capsaicin cream  Diabetes mellitus type 2 with neuropathy Diabetes is well controlled for age with hemoglobin A1C of 7.8%. Patient is on solumedrol for management of respiratory failure. -Continue Semglee  and SSI  Primary hypertension -Continue Toprol  XL  CAD Hyperlipidemia -Continue Lipitor, Zetia , aspirin  (held for bronchoscopy) and Ranexa   GERD -Continue Protonix   OSA -Continue CPAP at bedtime  Chronic constipation -Continue MiraLAX   BPH Patient with lower urinary tract symptoms. Patient started on finasteride . -Continue finasteride   PTSD Bipolar disorder Noted. Not currently on medication therapy.  Lumbar stenosis Noted.  Morbid obesity, class II Estimated body mass index is 30 kg/m as calculated from the following:   Height as of this encounter: 5' 8 (1.727 m).   Weight as of this encounter: 89.5 kg.   DVT prophylaxis: Lovenox  (held for bronchoscopy and hemoptysis). SCDs. Code Status:   Code Status: Full Code Family Communication: None at bedside Disposition Plan: Discharge home pending improvement of hemoptysis, ability to wean oxygen, oncology consult   Consultants:  Pulmonology Oncology  Procedures:  Bronchoscopy     Subjective: Continues with intermittent hemoptysis  Objective: BP (!) 111/57 (BP Location: Left Arm)   Pulse 60   Temp (!) 97.4 F (36.3 C) (Oral)   Resp 18   Ht 5' 8 (1.727 m)   Wt 89.5 kg   SpO2 95%   BMI 30.00 kg/m   Examination:   General: Appearance:    Obese male in no acute distress  Lungs:      respirations mildly labored  Heart:     Normal heart rate.   MS:   All extremities are intact.   Neurologic:   Awake, alert      Data Reviewed: I have personally reviewed following labs and imaging studies  CBC Lab Results  Component Value Date   WBC 10.6 (H) 04/25/2024   RBC 5.80 04/25/2024   HGB 15.4 04/25/2024   HCT 49.2 04/25/2024   MCV 84.8 04/25/2024   MCH 26.6 04/25/2024   PLT 362 04/25/2024   MCHC 31.3 04/25/2024   RDW 15.7 (H) 04/25/2024   LYMPHSABS 1.4 04/21/2024   MONOABS 0.8 04/21/2024   EOSABS 0.1 04/21/2024   BASOSABS 0.0 04/21/2024     Last metabolic panel Lab Results  Component Value Date   NA 134 (L) 04/23/2024   K 4.5 04/23/2024   CL 102 04/23/2024   CO2 23 04/23/2024   BUN 24 (H) 04/23/2024   CREATININE 1.03 04/23/2024   GLUCOSE 370 (H) 04/23/2024   GFRNONAA >60 04/23/2024   GFRAA  02/23/2009    >60        The eGFR has been calculated using the MDRD equation. This calculation has not been validated in all clinical situations. eGFR's persistently <60 mL/min signify possible Chronic Kidney Disease.   CALCIUM  9.2 04/23/2024   PROT 5.7 (L) 04/20/2024   ALBUMIN 2.6 (L) 04/20/2024   BILITOT 0.6 04/20/2024   ALKPHOS 47 04/20/2024   AST 18 04/20/2024   ALT 27 04/20/2024   ANIONGAP 9 04/23/2024    GFR: Estimated Creatinine Clearance: 70.4 mL/min (by C-G formula based on SCr of 1.03 mg/dL).  Recent Results (from the past 240 hours)  Resp panel by RT-PCR (RSV, Flu A&B, Covid) Anterior Nasal Swab     Status: None   Collection Time: 04/19/24  1:55 PM   Specimen: Anterior Nasal Swab  Result Value Ref Range Status   SARS Coronavirus 2 by RT PCR NEGATIVE NEGATIVE Final    Comment: (NOTE) SARS-CoV-2 target nucleic acids are NOT DETECTED.  The SARS-CoV-2 RNA is generally detectable in upper respiratory specimens during the acute phase of infection. The lowest concentration of SARS-CoV-2 viral copies this assay can detect is 138 copies/mL. A negative result does not preclude  SARS-Cov-2 infection and should not be used as the sole basis for treatment or other patient management decisions. A negative result may occur with  improper specimen collection/handling, submission of specimen other than nasopharyngeal swab, presence of viral mutation(s) within the areas targeted by this assay, and inadequate number of viral copies(<138 copies/mL). A negative result must be combined with clinical observations, patient history, and epidemiological information. The expected result is Negative.  Fact Sheet for Patients:  BloggerCourse.com  Fact Sheet for Healthcare Providers:  SeriousBroker.it  This test is no t yet approved or cleared by the [place]  FDA and  has been authorized for detection and/or diagnosis of SARS-CoV-2 by FDA under an Emergency Use Authorization (EUA). This EUA will remain  in effect (meaning this test can be used) for the duration of the COVID-19 declaration under Section 564(b)(1) of the Act, 21 U.S.C.section 360bbb-3(b)(1), unless the authorization is terminated  or revoked sooner.       Influenza A by PCR NEGATIVE NEGATIVE Final   Influenza B by PCR NEGATIVE NEGATIVE Final    Comment: (NOTE) The Xpert Xpress SARS-CoV-2/FLU/RSV plus assay is intended as an aid in the diagnosis of influenza from Nasopharyngeal swab specimens and should  not be used as a sole basis for treatment. Nasal washings and aspirates are unacceptable for Xpert Xpress SARS-CoV-2/FLU/RSV testing.  Fact Sheet for Patients: BloggerCourse.com  Fact Sheet for Healthcare Providers: SeriousBroker.it  This test is not yet approved or cleared by the [place]  FDA and has been authorized for detection and/or diagnosis of SARS-CoV-2 by FDA under an Emergency Use Authorization (EUA). This EUA will remain in effect (meaning this test can be used) for the duration of  the COVID-19 declaration under Section 564(b)(1) of the Act, 21 U.S.C. section 360bbb-3(b)(1), unless the authorization is terminated or revoked.     Resp Syncytial Virus by PCR NEGATIVE NEGATIVE Final    Comment: (NOTE) Fact Sheet for Patients: BloggerCourse.com  Fact Sheet for Healthcare Providers: SeriousBroker.it  This test is not yet approved or cleared by the [place]  FDA and has been authorized for detection and/or diagnosis of SARS-CoV-2 by FDA under an Emergency Use Authorization (EUA). This EUA will remain in effect (meaning this test can be used) for the duration of the COVID-19 declaration under Section 564(b)(1) of the Act, 21 U.S.C. section 360bbb-3(b)(1), unless the authorization is terminated or revoked.  Performed at [facility], [address], [place], [place] [address]   Blood Culture (routine x 2)     Status: None   Collection Time: 04/19/24  2:04 PM   Specimen: BLOOD  Result Value Ref Range Status   Specimen Description   Final    BLOOD RIGHT ANTECUBITAL Performed at [facility], [address] [place], [place] [address]    Special Requests   Final    BOTTLES DRAWN AEROBIC AND ANAEROBIC Blood Culture results may not be optimal due to an inadequate volume of blood received in culture bottles Performed at [facility], [address] [place], [place] [address]    Culture   Final    NO GROWTH 5 DAYS Performed at [facility], [address], [place], [place] [address]    Report Status 04/24/2024 FINAL  Final  Blood Culture (routine x 2)     Status: None   Collection Time: 04/19/24  2:26 PM   Specimen: BLOOD  Result Value Ref Range Status   Specimen Description   Final    BLOOD LEFT ANTECUBITAL Performed at [facility], [address], [place], [place] [address]    Special Requests   Final    BOTTLES DRAWN  AEROBIC AND ANAEROBIC Blood Culture results may not be optimal due to an inadequate volume of blood received in culture bottles Performed at [facility], [address], [place], [place] [address]    Culture   Final    NO GROWTH 5 DAYS Performed at [facility], [address], [place], [place] [address]    Report Status 04/24/2024 FINAL  Final  Respiratory (~20 pathogens) panel by PCR     Status: Abnormal   Collection Time: 04/19/24 11:09 PM   Specimen: Nasal Mucosa; Respiratory  Result Value Ref Range Status   Adenovirus NOT DETECTED NOT DETECTED Final   Coronavirus 229E NOT DETECTED NOT DETECTED Final    Comment: (NOTE) The Coronavirus on the Respiratory Panel, DOES NOT test for the novel  Coronavirus (2019 nCoV)    Coronavirus HKU1 NOT DETECTED NOT DETECTED Final   Coronavirus NL63 NOT DETECTED NOT DETECTED Final   Coronavirus OC43 NOT DETECTED NOT DETECTED Final   Metapneumovirus NOT DETECTED NOT DETECTED Final  Rhinovirus / Enterovirus DETECTED (A) NOT DETECTED Final   Influenza A NOT DETECTED NOT DETECTED Final   Influenza B NOT DETECTED NOT DETECTED Final   Parainfluenza Virus 1 NOT DETECTED NOT DETECTED Final   Parainfluenza Virus 2 NOT DETECTED NOT DETECTED Final   Parainfluenza Virus 3 NOT DETECTED NOT DETECTED Final   Parainfluenza Virus 4 NOT DETECTED NOT DETECTED Final   Respiratory Syncytial Virus NOT DETECTED NOT DETECTED Final   Bordetella pertussis NOT DETECTED NOT DETECTED Final   Bordetella Parapertussis NOT DETECTED NOT DETECTED Final   Chlamydophila pneumoniae NOT DETECTED NOT DETECTED Final   Mycoplasma pneumoniae NOT DETECTED NOT DETECTED Final    Comment: Performed at [facility], [address], [place], [place] [address]  MRSA Next Gen by PCR, Nasal     Status: None   Collection Time: 04/19/24 11:09 PM   Specimen: Nasal Mucosa; Nasal Swab  Result Value Ref Range Status   MRSA by PCR Next Gen NOT DETECTED NOT DETECTED  Final    Comment: (NOTE) The GeneXpert MRSA Assay (FDA approved for NASAL specimens only), is one component of a comprehensive MRSA colonization surveillance program. It is not intended to diagnose MRSA infection nor to guide or monitor treatment for MRSA infections. Test performance is not FDA approved in patients less than 43 years old. Performed at [facility], [address], [place], [place] [address]   Body fluid culture w Gram Stain     Status: None   Collection Time: 04/20/24 11:47 AM   Specimen: Pleural Fluid  Result Value Ref Range Status   Specimen Description   Final    FLUID RIGHT PLEURAL Performed at [facility], [address] [place], [place] [address]    Special Requests   Final    NONE Performed at [facility], [address], [place], [place] [address]    Gram Stain   Final    ABUNDANT WBC PRESENT, PREDOMINANTLY MONONUCLEAR NO ORGANISMS SEEN    Culture   Final    NO GROWTH 3 DAYS Performed at [facility], [address], [place], [place] [address]    Report Status 04/23/2024 FINAL  Final  Fungus Culture With Stain     Status: None (Preliminary result)   Collection Time: 04/22/24 12:16 PM   Specimen: Bronchial Alveolar Lavage; Respiratory  Result Value Ref Range Status   Fungus Stain Final report  Final    Comment: (NOTE) Performed At: [facility] [address] [place], [place] [id number] Pearlean Botts MD ON:[id number]    Fungus (Mycology) Culture PENDING  Incomplete   Fungal Source BRONCHIAL ALVEOLAR LAVAGE  Final    Comment: Performed at [facility], [address] [place], [place] [address]  Culture, BAL-quantitative w Gram Stain     Status: None   Collection Time: 04/22/24 12:16 PM   Specimen: Bronchial Alveolar Lavage; Respiratory  Result Value Ref Range Status   Specimen Description   Final    BRONCHIAL ALVEOLAR LAVAGE Performed at [facility], [address], [place], [place] [address]    Special Requests   Final    NONE Performed at [facility], [address] [place], [place] [address]    Gram Stain NO WBC SEEN NO ORGANISMS SEEN   Final   Culture   Final    NO GROWTH 2 DAYS Performed at [facility], [address], [place],   [address]    Report Status 04/25/2024 FINAL  Final  Aerobic/Anaerobic Culture w Gram Stain (surgical/deep wound)     Status: None   Collection Time: 04/22/24 12:16 PM   Specimen: Bronchial Alveolar Lavage; Respiratory  Result Value Ref Range Status   Specimen Description   Final    BRONCHIAL ALVEOLAR LAVAGE Performed at [facility], [address] [place], [place] [address]    Special Requests   Final    NONE Performed at [facility], [address], [place], [place] [address]    Gram Stain NO WBC SEEN NO ORGANISMS SEEN   Final   Culture   Final    No growth aerobically or anaerobically. Performed at [facility], [address], [place], [place] [address]    Report Status 04/27/2024 FINAL  Final  Acid Fast Smear (AFB)     Status: None   Collection Time: 04/22/24 12:16 PM   Specimen: Bronchial Alveolar Lavage; Respiratory  Result Value Ref Range Status   AFB Specimen Processing Concentration  Final   Acid Fast Smear Negative  Final    Comment: (NOTE) Performed At: [facility] [address] [place], [place] [id number] Pearlean Botts MD MV:[id number]    Source (AFB) BRONCHIAL ALVEOLAR LAVAGE  Final    Comment: Performed at [facility] [address], [place], [place] [address]  Fungus Culture Result     Status: None   Collection Time: 04/22/24 12:16 PM  Result Value Ref Range Status   Result 1 Comment  Final    Comment: (NOTE) KOH/Calcofluor preparation:  no fungus observed. Performed At: [facility] [address] [place], [place]  [id number] Pearlean Botts MD DG:[id number]       Radiology Studies: No results found.      LOS: 9 days    Katrina Parma DO [facility] 04/28/2024, 11:44 AM   If 7PM-7AM, please contact night-coverage [email or web address]

## 2024-04-28 NOTE — Consult Note (Addendum)
 [facility] CONSULT NOTE  Patient Care Team: Clinic, [name] as PCP - General Thukkani, Arun K, MD as PCP - Cardiology (Cardiology)  CHIEF COMPLAINTS/PURPOSE OF CONSULTATION:  Non-small cell lung cancer, recurrent  REFERRING PHYSICIAN: Dr. Duard Getting  HISTORY OF PRESENTING ILLNESS:  Jose Patrick 73 y.o. male who was admitted on 04/19/2024 with complaints of shortness of breath.  Patient states that he became very congested and progressively had difficulty breathing approximately 4 months ago.  Admits to going between [facility], [facility], and [facility] with similar complaints.  Follows with Oncology at [facility] where he has been treated for lung cancer. Workup in the hospital showed bilateral pneumonia.  Due to history of lung cancer, oncology consult was requested. Patient is seen awake and alert sitting up in chair at bedside with O2 via nasal cannula ongoing.  Patient is notably short of breath when he is talking with hemoptysis noted when he suctions himself.  Patient is a fair historian and details his medical history as documented, does not recall specifics or names of Providers. Medical history as stated is significant for lung cancer initially diagnosed in 2013.  States lung nodules first seen in 1974.  Also has history of multiple episodes of bronchitis, diabetes, hypertension and hyperlipidemia. Surgical history includes cervical fusion, lumbar fusion, tonsillectomy and birth are removal. Family history significant for both mother and father with lung cancer. Social history significant for 42-year tobacco use history, 1 pack/day, quit in 2013 when he was diagnosed with lung cancer.  Admits to social alcohol use although states he drank heavily in the past.  Denies illicit or recreational drug use.  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    I have reviewed his chart and materials related to his cancer extensively and collaborated history with the patient. Summary of oncologic history is as follows: Oncology History   No history exists.    ASSESSMENT & PLAN:  Non-small cell lung cancer, recurrent - Patient initially diagnosed with lung cancer in 2013.  Lung nodules first noted in 1974. --Status post left lung lobectomy in 2013.   -- More recently with right lung recurrence, treated at [facility]. - Status post recent radiation therapy in 2025 to right lung - Patient is now requesting to be managed for his lung cancer at [facility] because he states his pulmonary and other physicians are at [facility] and will like to centralize his care. - Medical oncology/Dr. Liam Redhead following and will make further evaluation and treatment recommendations.  Acute on chronic respiratory failure with hypoxia Bilateral pleural effusions Pneumonia - Continue supplemental oxygen as ordered - On steroids, continue taper as ordered - Continue antibiotics as ordered - Seen by pulmonary.  Respiratory following. - Continue supportive care  Hypertension Hyperlipidemia - Continue antihypertensives as ordered - On statins - Continue to monitor blood pressure closely   MEDICAL HISTORY:  Past Medical History:  Diagnosis Date   ALLERGIC RHINITIS 10/08/2007   Qualifier: Diagnosis of  By: Autry Legions MD, Alveda Aures    BENIGN PROSTATIC HYPERTROPHY 09/28/2010   Qualifier: Diagnosis of  By: Autry Legions MD, Alveda Aures    Bipolar affective disorder (HCC) 09/30/2012   COLONIC POLYPS, HX OF 10/08/2007   Qualifier: Diagnosis of  By: Autry Legions MD, Alveda Aures    CONSTIPATION, CHRONIC, HX OF 08/10/2007  Qualifier: Diagnosis of  By: Jarold Merlin    DIABETES MELLITUS, TYPE II 10/08/2007   Qualifier: Diagnosis of  By: Autry Legions MD, Alveda Aures    ERECTILE DYSFUNCTION 10/08/2007   Qualifier: Diagnosis of  By: Autry Legions MD, Alveda Aures    HYPERLIPIDEMIA  10/08/2007   Qualifier: Diagnosis of  By: Autry Legions MD, Alveda Aures    HYPERTENSION 03/20/2009   Qualifier: Diagnosis of  By: Autry Legions MD, Alveda Aures    PTSD (post-traumatic stress disorder) 09/30/2012   SPINAL STENOSIS, LUMBAR 10/08/2007   Per Dr Larrie Po - [facility]   Qualifier: Diagnosis of  By: Autry Legions MD, Alveda Aures      SURGICAL HISTORY: Past Surgical History:  Procedure Laterality Date   COLONOSCOPY WITH PROPOFOL  N/A 04/09/2021   Procedure: COLONOSCOPY WITH PROPOFOL ;  Surgeon: Irby Mannan, MD;  Location: [facility] ENDOSCOPY;  Service: Endoscopy;  Laterality: N/A;   cyst off right buttock     ESOPHAGOGASTRODUODENOSCOPY (EGD) WITH PROPOFOL  N/A 04/09/2021   Procedure: ESOPHAGOGASTRODUODENOSCOPY (EGD) WITH PROPOFOL ;  Surgeon: Irby Mannan, MD;  Location: [facility] ENDOSCOPY;  Service: Endoscopy;  Laterality: N/A;   PREPATELLAR BURSA EXCISION     removal right   s/p bilat hydrocelectomy  2008   x 2   TONSILLECTOMY     VIDEO BRONCHOSCOPY Right 04/22/2024   Procedure: BRONCHOSCOPY, WITH FLUOROSCOPY;  Surgeon: Denson Flake, MD;  Location: [facility] ENDOSCOPY;  Service: Cardiopulmonary;  Laterality: Right;    SOCIAL HISTORY: Social History   Socioeconomic History   Marital status: Married    Spouse name: Not on file   Number of children: 4   Years of education: Not on file   Highest education level: Not on file  Occupational History   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, 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  Tobacco Use   Smoking status: Former    Current packs/day: 0.00    Average packs/day: 1.5 packs/day for 41.0 years (61.5 ttl pk-yrs)    Types: Cigarettes    Start date: 83    Quit date: 2013    Years since quitting: 12.4   Smokeless tobacco: Never  Vaping Use   Vaping status: Never Used  Substance and Sexual Activity   Alcohol use: Yes   Drug use: No   Sexual activity: Not on file  Other Topics Concern   Not on file  Social History  Narrative   Not on file   Social Drivers of Health   Financial Resource Strain: Low Risk  (12/02/2023)   Received from [facility]   Overall Financial Resource Strain (CARDIA)    Difficulty of Paying Living Expenses: Not hard at all  Food Insecurity: No Food Insecurity (04/20/2024)   Hunger Vital Sign    Worried About Running Out of Food in the Last Year: Never true    Ran Out of Food in the Last Year: Never true  Transportation Needs: No Transportation Needs (04/20/2024)   PRAPARE - Administrator, Civil Service (Medical): No    Lack of Transportation (Non-Medical): No  Physical Activity: Unknown (03/17/2024)   Received from [facility]   PCARE Exercise SDOH    Exercise: Disabled    PCare Exercise SDOH: Not on file    PCare Exercise SDOH: Not on file  Stress: Stress Concern Present (03/08/2024)   Received from [facility] - Occupational Stress Questionnaire  Feeling of Stress : Rather much  Social Connections: Moderately Integrated (04/20/2024)   Social Connection and Isolation Panel [NHANES]    Frequency of Communication with Friends and Family: Once a week    Frequency of Social Gatherings with Friends and Family: Once a week    Attends Religious Services: More than 4 times per year    Active Member of [employer] or Organizations: Yes    Attends Engineer, structural: More than 4 times per year    Marital Status: Married  Catering manager Violence: Not At Risk (04/20/2024)   Humiliation, Afraid, Rape, and Kick questionnaire    Fear of Current or Ex-Partner: No    Emotionally Abused: No    Physically Abused: No    Sexually Abused: No    FAMILY HISTORY: Family History  Problem Relation Age of Onset   Cancer Mother    Ulcers Mother        Stomach   Depression Mother    Anxiety disorder Mother    Allergies Sister      PHYSICAL EXAMINATION: ECOG PERFORMANCE STATUS: 2 - Symptomatic, <50% confined to  bed  Vitals:   04/28/24 0514 04/28/24 0859  BP: 134/64   Pulse: 60   Resp: 17   Temp: (!) 97.4 F (36.3 C)   SpO2: 96% 95%   Filed Weights   04/19/24 1141 04/23/24 0600  Weight: 232 lb (105.2 kg) 197 lb 5 oz (89.5 kg)    GENERAL: alert, +mild distress and comfortable SKIN: skin color, texture, turgor are normal, no rashes or significant lesions EYES: normal, conjunctiva are pink and non-injected, sclera clear OROPHARYNX: no exudate, no erythema and lips, buccal mucosa, and tongue normal  NECK: supple, thyroid normal size, non-tender, without nodularity LYMPH: no palpable lymphadenopathy in the cervical, axillary or inguinal LUNGS: +coarse to auscultation  HEART: regular rate & rhythm and no murmurs and no lower extremity edema ABDOMEN: abdomen soft, non-tender and normal bowel sounds MUSCULOSKELETAL: no cyanosis of digits and no clubbing  PSYCH: alert & oriented x 3 with fluent speech NEURO: no focal motor/sensory deficits   ALLERGIES:  is allergic to chlorzoxazone and tramadol.  MEDICATIONS:  Current Facility-Administered Medications  Medication Dose Route Frequency Provider Last Rate Last Admin   acetaminophen  (TYLENOL ) tablet 650 mg  650 mg Oral Q6H PRN Byrum, Robert S, MD   650 mg at 04/25/24 2231   Or   acetaminophen  (TYLENOL ) suppository 650 mg  650 mg Rectal Q6H PRN Denson Flake, MD       albuterol  (PROVENTIL ) (2.5 MG/3ML) 0.083% nebulizer solution 2.5 mg  2.5 mg Inhalation Q6H PRN Byrum, Robert S, MD   2.5 mg at 04/25/24 1805   arformoterol  (BROVANA ) nebulizer solution 15 mcg  15 mcg Nebulization BID Byrum, Robert S, MD   15 mcg at 04/28/24 0859   atorvastatin  (LIPITOR) tablet 40 mg  40 mg Oral QPM Byrum, Robert S, MD   40 mg at 04/27/24 1738   benzonatate  (TESSALON ) capsule 200 mg  200 mg Oral TID PRN Byrum, Robert S, MD   200 mg at 04/25/24 2231   budesonide  (PULMICORT ) nebulizer solution 0.5 mg  0.5 mg Nebulization BID Byrum, Robert S, MD   0.5 mg at 04/28/24  0904   capsaicin (ZOSTRIX) 0.025 % cream   Topical BID Verlyn Goad, MD   Given at 04/27/24 2118   Chlorhexidine  Gluconate Cloth 2 % PADS 6 each  6 each Topical Daily Denson Flake, MD   6  each at 04/27/24 1010   chlorpheniramine-HYDROcodone (TUSSIONEX) 10-8 MG/5ML suspension 5 mL  5 mL Oral Q12H PRN Denson Flake, MD   5 mL at 04/25/24 2234   cholecalciferol  (VITAMIN D3) 25 MCG (1000 UNIT) tablet 1,000 Units  1,000 Units Oral Daily Byrum, Robert S, MD   1,000 Units at 04/27/24 1007   ezetimibe  (ZETIA ) tablet 10 mg  10 mg Oral Daily Byrum, Robert S, MD   10 mg at 04/27/24 1008   ferrous sulfate  tablet 325 mg  325 mg Oral BID WC Byrum, Robert S, MD   325 mg at 04/28/24 0981   finasteride  (PROSCAR ) tablet 5 mg  5 mg Oral Daily Byrum, Robert S, MD   5 mg at 04/27/24 1008   gabapentin  (NEURONTIN ) capsule 300 mg  300 mg Oral BID Byrum, Robert S, MD   300 mg at 04/27/24 2118   guaiFENesin  (MUCINEX ) 12 hr tablet 1,200 mg  1,200 mg Oral BID Elisabeth Guild, NP   1,200 mg at 04/27/24 2118   insulin  aspart (novoLOG ) injection 0-20 Units  0-20 Units Subcutaneous TID WC Verlyn Goad, MD   4 Units at 04/28/24 1914   insulin  glargine-yfgn (SEMGLEE ) injection 25 Units  25 Units Subcutaneous QHS Denson Flake, MD   25 Units at 04/27/24 2118   magnesium  oxide (MAG-OX) tablet 400 mg  400 mg Oral Q breakfast Byrum, Robert S, MD   400 mg at 04/28/24 7829   metoprolol  succinate (TOPROL -XL) 24 hr tablet 100 mg  100 mg Oral Daily Byrum, Robert S, MD   100 mg at 04/27/24 1008   ondansetron  (ZOFRAN ) tablet 4 mg  4 mg Oral Q6H PRN Denson Flake, MD       Or   ondansetron  (ZOFRAN ) injection 4 mg  4 mg Intravenous Q6H PRN Denson Flake, MD       pantoprazole  (PROTONIX ) EC tablet 40 mg  40 mg Oral Daily Byrum, Robert S, MD   40 mg at 04/27/24 1007   polyethylene glycol (MIRALAX  / GLYCOLAX ) packet 17 g  17 g Oral Daily Verlyn Goad, MD   17 g at 04/27/24 1007   predniSONE  (DELTASONE ) tablet 30 mg  30 mg  Oral Q breakfast Verlyn Goad, MD   30 mg at 04/28/24 5621   pyridOXINE  (VITAMIN B6) tablet 100 mg  100 mg Oral Daily Byrum, Robert S, MD   100 mg at 04/27/24 1006   ranolazine  (RANEXA ) 12 hr tablet 500 mg  500 mg Oral BID Byrum, Robert S, MD   500 mg at 04/27/24 2118   revefenacin  (YUPELRI ) nebulizer solution 175 mcg  175 mcg Nebulization Daily Denson Flake, MD   175 mcg at 04/28/24 0859   senna-docusate (Senokot-S) tablet 1 tablet  1 tablet Oral QHS Denson Flake, MD   1 tablet at 04/27/24 2118     LABORATORY DATA:  I have reviewed the data as listed Lab Results  Component Value Date   WBC 10.6 (H) 04/25/2024   HGB 15.4 04/25/2024   HCT 49.2 04/25/2024   MCV 84.8 04/25/2024   PLT 362 04/25/2024   Recent Labs    07/30/23 1157 10/06/23 1202 10/06/23 1202 12/02/23 1610 12/02/23 1616 04/01/24 2125 04/02/24 0337 04/20/24 0328 04/21/24 0334 04/23/24 1338  NA  --   --   --  135   < > 137   < > 135 135 134*  K  --   --   --  4.3   < >  3.8   < > 5.4* 4.4 4.5  CL  --   --   --  100   < > 104   < > 104 100 102  CO2  --   --    < > 23  --  25   < > 24 27 23   GLUCOSE  --   --   --  150*   < > 176*   < > 158* 157* 370*  BUN  --   --   --  14   < > 20   < > 16 27* 24*  CREATININE  --   --   --  1.11   < > 1.07   < > 1.09 1.18 1.03  CALCIUM   --   --    < > 9.8  --  9.1   < > 8.9 8.9 9.2  GFRNONAA  --   --    < > >60  --  >60   < > >60 >60 >60  PROT 7.0 6.7  --  7.6  --  5.8*  --  5.7*  --   --   ALBUMIN 4.5 4.3  --  4.0  --  2.9*  --  2.6*  --   --   AST 25 24  --  23  --  18  --  18  --   --   ALT 37 30  --  25  --  28  --  27  --   --   ALKPHOS 86 77  --  66  --  58  --  47  --   --   BILITOT 0.3 0.3  --  0.7  --  0.8  --  0.6  --   --   BILIDIR <0.10 0.15  --   --   --   --   --   --   --   --    < > = values in this interval not displayed.    RADIOGRAPHIC STUDIES: I have personally reviewed the radiological images as listed and agreed with the findings in the  report. DG CHEST PORT 1 VIEW Result Date: 04/25/2024 CLINICAL DATA:  Increased dyspnea EXAM: PORTABLE CHEST 1 VIEW COMPARISON:  04/22/2024 FINDINGS: Stable cardiomediastinal silhouette. Diffuse bilateral reticulonodular opacities and interstitial coarsening prior. There is similar to increased airspace opacities in the right mid and lower lung. Layering right greater than left pleural effusions. No pneumothorax. No displaced rib fracture. IMPRESSION: 1. Similar to increased airspace and interstitial opacities greatest in the right lower lobe. 2. Layering right greater than left pleural effusions. Electronically Signed   By: Rozell Cornet M.D.   On: 04/25/2024 19:37   DG Chest Port 1 View Result Date: 04/22/2024 CLINICAL DATA:  Status post bronchoscopy with biopsy. EXAM: PORTABLE CHEST 1 VIEW COMPARISON:  April 20, 2024. FINDINGS: Stable cardiomediastinal silhouette. Stable bilateral reticulonodular opacities are noted throughout both lungs concerning for pneumonia or edema with associated pleural effusions, right greater than left. No pneumothorax. Bony thorax is unremarkable. IMPRESSION: Bilateral lung opacities and pleural effusions as noted above. No pneumothorax is noted. Electronically Signed   By: Rosalene Colon M.D.   On: 04/22/2024 13:47   DG C-ARM BRONCHOSCOPY Result Date: 04/22/2024 C-ARM BRONCHOSCOPY: Fluoroscopy was utilized by the requesting physician.  No radiographic interpretation.   DG CHEST PORT 1 VIEW Result Date: 04/20/2024 CLINICAL DATA:  Status post thoracentesis. EXAM: PORTABLE CHEST 1 VIEW COMPARISON:  Radiograph in CT yesterday FINDINGS: No pneumothorax post thoracentesis. Slight decreased right basilar opacity, persistent airspace disease and likely pleural fluid persists. Underlying emphysema and bronchial thickening. Pulmonary nodules on CT are faintly visualized. Stable heart size and mediastinal contours. IMPRESSION: 1. No pneumothorax post thoracentesis. 2. Slight decreased  right basilar opacity. Persistent right sided airspace disease and likely pleural fluid. Electronically Signed   By: Chadwick Colonel M.D.   On: 04/20/2024 15:10   CT CHEST WO CONTRAST Result Date: 04/19/2024 CLINICAL DATA:  Multifocal pneumonia. Worsening cough and shortness of breath over the past few days. Decreased oxygen saturation. EXAM: CT CHEST WITHOUT CONTRAST TECHNIQUE: Multidetector CT imaging of the chest was performed following the standard protocol without IV contrast. RADIATION DOSE REDUCTION: This exam was performed according to the departmental dose-optimization program which includes automated exposure control, adjustment of the mA and/or kV according to patient size and/or use of iterative reconstruction technique. COMPARISON:  Chest radiograph 04/19/2024.  CT chest 04/01/2024 FINDINGS: Cardiovascular: Normal heart size. Minimal pericardial effusion. Normal caliber thoracic aorta. Calcification of the aorta and coronary arteries. Mediastinum/Nodes: Esophagus is decompressed. Thyroid gland is unremarkable. Mediastinal lymphadenopathy with largest pretracheal nodes measuring up to about 1.4 cm diameter. No change since prior study. This is nonspecific but likely to be reactive. Lungs/Pleura: Bilateral pleural effusions, greater on the right. Dense consolidation in the right lower lung with patchy airspace disease throughout the remainder the right lung. Numerous small nodules demonstrated throughout the left lung in the area of portion of the right lung which are new since prior study. Short interval time frame suggest that these are likely infectious or inflammatory. Changes most likely represent progressing pneumonia. This could also represent atypical pneumonia such as TB or fungal infection. Septic emboli would also be a possibility. Follow-up to resolution is recommended. Underlying emphysematous changes in the lungs. Upper Abdomen: No acute abnormalities. Musculoskeletal: Degenerative  changes in the spine. Postoperative change in the cervical spine. No acute bony abnormalities. IMPRESSION: 1. Bilateral pleural effusions, greater on the right. 2. Dense consolidation in the right lower lung with airspace disease throughout the remaining right lung. This is progressing since the prior study. 3. Developing diffuse fine nodular infiltration throughout the left lung in the aerated portion of the right lung. This is likely infectious or inflammatory. 4. Underlying emphysematous changes in the lungs. Electronically Signed   By: Boyce Byes M.D.   On: 04/19/2024 18:25   DG Chest 2 View Result Date: 04/19/2024 CLINICAL DATA:  Cough, shortness of breath EXAM: CHEST - 2 VIEW COMPARISON:  04/01/2024 FINDINGS: Diffuse bilateral airspace disease again noted, right greater than left, similar to prior study. Possible small bilateral effusions. Heart and mediastinal contours within normal limits. Aortic atherosclerosis. IMPRESSION: Continued diffuse bilateral airspace disease, right greater than left concerning for pneumonia. Findings similar to prior study. Electronically Signed   By: Janeece Mechanic M.D.   On: 04/19/2024 12:48   CT Angio Chest PE W and/or Wo Contrast Result Date: 04/01/2024 CLINICAL DATA:  Pulmonary embolism (PE) suspected, high prob. Pneumonia, shortness of breath. EXAM: CT ANGIOGRAPHY CHEST WITH CONTRAST TECHNIQUE: Multidetector CT imaging of the chest was performed using the standard protocol during bolus administration of intravenous contrast. Multiplanar CT image reconstructions and MIPs were obtained to evaluate the vascular anatomy. RADIATION DOSE REDUCTION: This exam was performed according to the departmental dose-optimization program which includes automated exposure control, adjustment of the mA and/or kV according to patient size and/or use of iterative reconstruction technique. CONTRAST:  75mL OMNIPAQUE  IOHEXOL  350 MG/ML SOLN COMPARISON:  12/02/2023 FINDINGS: Cardiovascular:  No filling defects in the pulmonary arteries to suggest pulmonary emboli. Heart is normal size. Aorta is normal caliber. Scattered coronary artery and aortic calcifications. Mediastinum/Nodes: Mildly enlarged right paratracheal lymph node measuring 11 mm, likely reactive. No axillary or hilar adenopathy. Trachea and esophagus are unremarkable. Thyroid unremarkable. Lungs/Pleura: Emphysema. Dense consolidation throughout the right lower lobe and in the inferior right upper lobe, progressed since prior study concerning for pneumonia. Numerous small nodules throughout the left lung, new since prior study. These are all 5 mm or less in size. No effusions. Upper Abdomen: No acute findings Musculoskeletal: Chest wall soft tissues are unremarkable. No acute bony abnormality. Review of the MIP images confirms the above findings. IMPRESSION: No evidence of pulmonary embolus. Dense consolidation in the right lower lobe and inferior right upper lobe concerning for pneumonia. Numerous scattered small nodules throughout the left lung, 5 mm or less in size, new since prior study. Recommend follow-up CT in 3 3-6 months to assess stability. Coronary artery disease. Aortic Atherosclerosis (ICD10-I70.0) and Emphysema (ICD10-J43.9). Electronically Signed   By: Janeece Mechanic M.D.   On: 04/01/2024 23:06   DG Chest Port 1 View Result Date: 04/01/2024 CLINICAL DATA:  Questionable sepsis - evaluate for abnormality. History of pneumonia. Shortness of breath. EXAM: PORTABLE CHEST 1 VIEW COMPARISON:  12/02/2023 FINDINGS: Diffuse bilateral airspace disease, worsening since prior study. These are most pronounced in the lower lobes. Heart is borderline in size. No definite effusions. No pneumothorax. No acute bony abnormality. IMPRESSION: Worsening bilateral airspace disease, most pronounced in the lower lobes. Findings could reflect edema or infection. Electronically Signed   By: Janeece Mechanic M.D.   On: 04/01/2024 22:15     The total time  spent in the appointment was 55 minutes encounter with patients including review of chart and various tests results, discussions about plan of care and coordination of care plan   All questions were answered. The patient knows to call the clinic with any problems, questions or concerns. No barriers to learning was detected.  Jacqualin Mate, NP 6/11/20259:33 AM   ADDENDUM: Hematology/Oncology Attending: The patient was seen and examined.  I reviewed his record, lab, scan and recommended his care plan.  I agree with the above note. The patient, with a history of lung cancer, presents with worsening respiratory symptoms. He is accompanied by his wife, Jose Patrick, and his grandson, Jose Patrick.  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 At that time, he had three tumors, two of which resolved spontaneously, and one remained stable. He received chemotherapy prior to the surgery. In July 2024, a nodule was detected in the right lung, which was hypermetabolic on PET scan but negative on biopsy. Despite the negative biopsy, he received radiation treatment due to suspicion of cancer. A recent bronchoscopy confirmed lung cancer in the right lung, now considered metastatic.  In recent months, he experienced worsening respiratory symptoms initially thought to be pneumonia, for which he was treated with antibiotics. Despite treatment, his condition deteriorated, leading to multiple hospitalizations. He was hospitalized at [facility] for two days, then transferred to another facility for six days, and subsequently readmitted for seven days due to worsening symptoms. He has significant difficulty breathing, fatigue, and inability to walk due to shortness of breath. No hemoptysis or sputum production, but he has a dry cough. He occasionally experiences stabbing  chest pain on the left side and in the center.  He has a history of COPD, severe  emphysema, and PVCs. He also has diabetes, hypertension, and takes medication for hyperlipidemia. His family history is significant for both parents having died of lung cancer. He has a history of smoking for 42 years and occupational exposure to asbestos. He previously drank alcohol socially but has stopped due to medication interactions. He is allergic to the 1942 version of paraffin Forte and possibly tramadol.  He reports blurred vision, for which he recently received new glasses. No nausea, vomiting, diarrhea, or headaches.  Assessment & Plan:  Metastatic Non-Small Cell lung cancer Stage IV metastatic lung cancer with confirmed spread in the right lung and possible involvement of the left lung. Recent bronchoscopy confirmed malignancy on the right side. Previous biopsy in July 2024 was negative but suspicious. Symptoms include dyspnea, fatigue, and dry cough. The cancer exacerbates respiratory issues alongside COPD. Further imaging and molecular testing are necessary to guide treatment options, including chemotherapy and immunotherapy. Coordination with other healthcare providers is crucial for treatment convenience. - Order PET scan to assess the extent of metastasis. - Order MRI of the brain to evaluate for brain metastasis. - Send biopsy tissue for molecular marker testing. - Discuss potential chemotherapy and immunotherapy options based on test results.  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 Symptoms include dyspnea and fatigue, exacerbated by lung cancer.  Hypertension Hypertension, currently managed with medication.  Hiatal hernia with GERD Hiatal hernia with GERD, contributing to occasional chest discomfort.  Diabetes mellitus Diabetes mellitus, no specific issues or management changes discussed during the encounter.  Follow-up Emphasized the importance of follow-up care and coordination with other healthcare providers for  treatment planning. - [facility]ordinate care with [facility], [facility], and [facility] as needed. - Provide contact information for further questions and coordination.  Disclaimer: This note was dictated with voice recognition software. Similar sounding words can inadvertently be transcribed and may be missed upon review. Aurelio Blower, MD

## 2024-04-28 NOTE — Plan of Care (Signed)
  Problem: Coping: Goal: Ability to adjust to condition or change in health will improve Outcome: Progressing   Problem: Respiratory: Goal: Ability to maintain adequate ventilation will improve Outcome: Progressing   Problem: Clinical Measurements: Goal: Ability to maintain clinical measurements within normal limits will improve Outcome: Progressing

## 2024-04-28 NOTE — Plan of Care (Signed)
  Problem: Coping: Goal: Ability to adjust to condition or change in health will improve Outcome: Progressing   Problem: Metabolic: Goal: Ability to maintain appropriate glucose levels will improve Outcome: Progressing   Problem: Respiratory: Goal: Ability to maintain adequate ventilation will improve Outcome: Progressing Goal: Ability to maintain a clear airway will improve Outcome: Progressing   Problem: Clinical Measurements: Goal: Ability to maintain clinical measurements within normal limits will improve Outcome: Progressing Goal: Diagnostic test results will improve Outcome: Progressing Goal: Respiratory complications will improve Outcome: Progressing Goal: Cardiovascular complication will be avoided Outcome: Progressing

## 2024-04-28 NOTE — Plan of Care (Signed)
  Problem: Education: Goal: Ability to describe self-care measures that may prevent or decrease complications (Diabetes Survival Skills Education) will improve Outcome: Progressing   Problem: Coping: Goal: Ability to adjust to condition or change in health will improve Outcome: Progressing   Problem: Metabolic: Goal: Ability to maintain appropriate glucose levels will improve Outcome: Progressing   Problem: Tissue Perfusion: Goal: Adequacy of tissue perfusion will improve Outcome: Progressing   Problem: Respiratory: Goal: Ability to maintain adequate ventilation will improve Outcome: Progressing   Problem: Clinical Measurements: Goal: Ability to maintain clinical measurements within normal limits will improve Outcome: Progressing   Problem: Nutrition: Goal: Adequate nutrition will be maintained Outcome: Progressing   Problem: Pain Managment: Goal: General experience of comfort will improve and/or be controlled Outcome: Progressing   Problem: Skin Integrity: Goal: Risk for impaired skin integrity will decrease Outcome: Progressing

## 2024-04-28 NOTE — Progress Notes (Signed)
 Physical Therapy Treatment Patient Details Name: Jose Patrick MRN: [id number] DOB: Mar 31, 1951 Today's Date: 04/28/2024   History of Present Illness 73 yo M  who presented to [facility] ED 6/2 for eval of persistent cough, suspected PNA. 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 Recent admission 5/15/-04/07/24 for PNA.    PT Comments  AxO x 3 pleasant and willing Pt c/o increased B LE edema and increased chest heaviness.  VERY limited amb distance 15 feet this session due to increased cough and c/o chest heaviness on 6 lts sats avg 96%. Unable to wean down this session.   Pt plans to D/C back home with Spouse    If plan is discharge home, recommend the following: Assistance with cooking/housework;Help with stairs or ramp for entrance   Can travel by private vehicle        Equipment Recommendations  None recommended by PT    Recommendations for Other Services       Precautions / Restrictions Precautions Precautions: Fall Precaution/Restrictions Comments: O2 dep @ baseline Restrictions Weight Bearing Restrictions Per Provider Order: No     Mobility  Bed Mobility               General bed mobility comments: up in recliner    Transfers Overall transfer level: Needs assistance Equipment used: Rolling walker (2 wheels) Transfers: Sit to/from Stand Sit to Stand: Supervision           General transfer comment: VCs hand placement and increased effort    Ambulation/Gait Ambulation/Gait assistance: Contact guard assist, Supervision Gait Distance (Feet): 15 Feet Assistive device: Rolling walker (2 wheels) Gait Pattern/deviations: Step-through pattern, Decreased stride length Gait velocity: decreased     General Gait Details: VERY limited amb distance 15 feet this session due to increased cough and c/o chest heaviness on 6 lts sats avg 96%.   Stairs             Wheelchair Mobility     Tilt Bed    Modified Rankin (Stroke  Patients Only)       Balance                                            Communication Communication Communication: No apparent difficulties  Cognition Arousal: Alert Behavior During Therapy: WFL for tasks assessed/performed   PT - Cognitive impairments: No apparent impairments                       PT - Cognition Comments: AxO x 3 pleasant and willing Following commands: Intact      Cueing Cueing Techniques: Verbal cues  Exercises      General Comments        Pertinent Vitals/Pain Pain Assessment Pain Assessment: No/denies pain    Home Living                          Prior Function            PT Goals (current goals can now be found in the care plan section) Progress towards PT goals: Progressing toward goals    Frequency    Min 3X/week      PT Plan      Co-evaluation              AM-PAC PT 6 Clicks Mobility  Outcome Measure  Help needed turning from your back to your side while in a flat bed without using bedrails?: A Little Help needed moving from lying on your back to sitting on the side of a flat bed without using bedrails?: A Little Help needed moving to and from a bed to a chair (including a wheelchair)?: A Little Help needed standing up from a chair using your arms (e.g., wheelchair or bedside chair)?: A Little Help needed to walk in hospital room?: A Little Help needed climbing 3-5 steps with a railing? : A Little 6 Click Score: 18    End of Session Equipment Utilized During Treatment: Gait belt;Oxygen Activity Tolerance: Patient tolerated treatment well Patient left: in chair;with call bell/phone within reach;with chair alarm set Nurse Communication: Mobility status PT Visit Diagnosis: Unsteadiness on feet (R26.81)     Time: 4098-1191 PT Time Calculation (min) (ACUTE ONLY): 19 min  Charges:    $Gait Training: 8-22 mins PT General Charges $$ ACUTE PT VISIT: 1 Visit                      {Hildur Bayer  PTA Acute  Rehabilitation Services Office M-F          [phone]

## 2024-04-28 NOTE — TOC Progression Note (Signed)
 Transition of Care [facility]) - Progression Note    Patient Details  Name: Jose Patrick MRN: [id number] Date of Birth: 08/25/1951  [facility]) CM/SW Contact  Jonni Nettle, LCSW Phone Number: 04/28/2024, 10:22 AM  Clinical Narrative:    CSW spoke with Shelvy Dickens from [facility], who advised pt is currently active with agency for [facility] services. Pt will continue [facility] PT/OT services upon discharge. CSW notified Amy with [facility] to cancel referral. TOC will continue to follow.   Expected Discharge Plan: Home w Home Health Services Barriers to Discharge: Continued Medical Work up  Expected Discharge Plan and Services In-house Referral: Clinical Social Work Discharge Planning Services: NA Post Acute Care Choice: Home Health Living arrangements for the past 2 months: Single Family Home                 DME Arranged: N/A DME Agency: NA       HH Arranged: PT, OT HH Agency: Other - See comment [employer] [facility]) Date HH Agency Contacted: 04/28/24 Time HH Agency Contacted: 1022 Representative spoke with at [facility] Agency: Shelvy Dickens   Social Determinants of Health (SDOH) Interventions SDOH Screenings   Food Insecurity: No Food Insecurity (04/20/2024)  Housing: Low Risk  (04/20/2024)  Transportation Needs: No Transportation Needs (04/20/2024)  Utilities: Not At Risk (04/20/2024)  Depression (PHQ2-9): Low Risk  (11/20/2021)  Financial Resource Strain: Low Risk  (12/02/2023)   Received from [facility]  Physical Activity: Unknown (03/17/2024)   Received from [facility]  Social Connections: Moderately Integrated (04/20/2024)  Stress: Stress Concern Present (03/08/2024)   Received from [facility]  Tobacco Use: Medium Risk (04/22/2024)    Readmission Risk Interventions    04/20/2024   11:08 AM 04/02/2024    1:08 PM  Readmission Risk Prevention Plan  Transportation Screening Complete Complete  PCP or Specialist Appt within 5-7 Days  Complete  PCP or Specialist  Appt within 3-5 Days Complete   Home Care Screening  Complete  Medication Review (RN CM)  Complete  HRI or Home Care Consult Complete   Social Work Consult for Recovery Care Planning/Counseling Complete   Palliative Care Screening Not Applicable   Medication Review (RN Care Manager) Complete    [place], MSW, LCSW 04/28/2024 10:23 AM

## 2024-04-28 NOTE — Progress Notes (Signed)
   04/28/24 0032  BiPAP/CPAP/SIPAP  $ Non-Invasive Home Ventilator  Subsequent  BiPAP/CPAP/SIPAP Pt Type Adult  BiPAP/CPAP/SIPAP DREAMSTATIOND (Bilevel)  Mask Type Full face mask  Dentures removed? Not applicable (N/A)  Mask Size Large  Respiratory Rate 18 breaths/min  IPAP 10 cmH20  EPAP 5 cmH2O  Flow Rate 4 lpm  Patient Home Machine No  Patient Home Mask No  Patient Home Tubing No  Auto Titrate No  CPAP/SIPAP surface wiped down Yes  Device Plugged into RED Power Outlet Yes  BiPAP/CPAP /SiPAP Vitals  SpO2 92 %

## 2024-04-29 DIAGNOSIS — J9621 Acute and chronic respiratory failure with hypoxia: Secondary | ICD-10-CM | POA: Diagnosis not present

## 2024-04-29 DIAGNOSIS — J189 Pneumonia, unspecified organism: Secondary | ICD-10-CM | POA: Diagnosis not present

## 2024-04-29 LAB — CBC
HCT: 48.2 % (ref 39.0–52.0)
Hemoglobin: 15.1 g/dL (ref 13.0–17.0)
MCH: 26.2 pg (ref 26.0–34.0)
MCHC: 31.3 g/dL (ref 30.0–36.0)
MCV: 83.7 fL (ref 80.0–100.0)
Platelets: 282 10*3/uL (ref 150–400)
RBC: 5.76 MIL/uL (ref 4.22–5.81)
RDW: 15.7 % — ABNORMAL HIGH (ref 11.5–15.5)
WBC: 9.2 10*3/uL (ref 4.0–10.5)
nRBC: 0 % (ref 0.0–0.2)

## 2024-04-29 LAB — BASIC METABOLIC PANEL WITH GFR
Anion gap: 8 (ref 5–15)
BUN: 18 mg/dL (ref 8–23)
CO2: 31 mmol/L (ref 22–32)
Calcium: 9.4 mg/dL (ref 8.9–10.3)
Chloride: 98 mmol/L (ref 98–111)
Creatinine, Ser: 0.86 mg/dL (ref 0.61–1.24)
GFR, Estimated: >60 mL/min (ref >60)
Glucose, Bld: 115 mg/dL — ABNORMAL HIGH (ref 70–99)
Potassium: 4.3 mmol/L (ref 3.5–5.1)
Sodium: 137 mmol/L (ref 135–145)

## 2024-04-29 LAB — GLUCOSE, CAPILLARY
Glucose-Capillary: 113 mg/dL — ABNORMAL HIGH (ref 70–99)
Glucose-Capillary: 182 mg/dL — ABNORMAL HIGH (ref 70–99)
Glucose-Capillary: 284 mg/dL — ABNORMAL HIGH (ref 70–99)
Glucose-Capillary: 334 mg/dL — ABNORMAL HIGH (ref 70–99)

## 2024-04-29 MED ORDER — FUROSEMIDE 10 MG/ML IJ SOLN
20.0000 mg | Freq: Once | INTRAMUSCULAR | Status: AC
  Administered 2024-04-29: 20 mg via INTRAVENOUS
  Filled 2024-04-29: qty 2

## 2024-04-29 NOTE — Progress Notes (Signed)
   04/29/24 0020  BiPAP/CPAP/SIPAP  $ Non-Invasive Home Ventilator  Subsequent  BiPAP/CPAP/SIPAP Pt Type Adult  BiPAP/CPAP/SIPAP DREAMSTATIOND (BiLevel)  Mask Type Full face mask  Dentures removed? Not applicable  Mask Size Large  Respiratory Rate 18 breaths/min  IPAP 10 cmH20  EPAP 5 cmH2O  Flow Rate 4 lpm  Patient Home Machine No  Patient Home Mask No  Patient Home Tubing No  Auto Titrate No  CPAP/SIPAP surface wiped down Yes

## 2024-04-29 NOTE — Plan of Care (Signed)
   Problem: Education: Goal: Ability to describe self-care measures that may prevent or decrease complications (Diabetes Survival Skills Education) will improve Outcome: Progressing Goal: Individualized Educational Video(s) Outcome: Progressing   Problem: Coping: Goal: Ability to adjust to condition or change in health will improve Outcome: Progressing   Problem: Fluid Volume: Goal: Ability to maintain a balanced intake and output will improve Outcome: Progressing   Problem: Health Behavior/Discharge Planning: Goal: Ability to identify and utilize available resources and services will improve Outcome: Progressing Goal: Ability to manage health-related needs will improve Outcome: Progressing   Problem: Metabolic: Goal: Ability to maintain appropriate glucose levels will improve Outcome: Progressing   Problem: Nutritional: Goal: Maintenance of adequate nutrition will improve Outcome: Progressing Goal: Progress toward achieving an optimal weight will improve Outcome: Progressing   Problem: Skin Integrity: Goal: Risk for impaired skin integrity will decrease Outcome: Progressing   Problem: Tissue Perfusion: Goal: Adequacy of tissue perfusion will improve Outcome: Progressing   Problem: Activity: Goal: Ability to tolerate increased activity will improve Outcome: Progressing   Problem: Clinical Measurements: Goal: Ability to maintain a body temperature in the normal range will improve Outcome: Progressing   Problem: Respiratory: Goal: Ability to maintain adequate ventilation will improve Outcome: Progressing Goal: Ability to maintain a clear airway will improve Outcome: Progressing   Problem: Education: Goal: Knowledge of General Education information will improve Description: Including pain rating scale, medication(s)/side effects and non-pharmacologic comfort measures Outcome: Progressing   Problem: Health Behavior/Discharge Planning: Goal: Ability to manage  health-related needs will improve Outcome: Progressing   Problem: Clinical Measurements: Goal: Ability to maintain clinical measurements within normal limits will improve Outcome: Progressing Goal: Will remain free from infection Outcome: Progressing Goal: Diagnostic test results will improve Outcome: Progressing Goal: Respiratory complications will improve Outcome: Progressing Goal: Cardiovascular complication will be avoided Outcome: Progressing   Problem: Activity: Goal: Risk for activity intolerance will decrease Outcome: Progressing   Problem: Nutrition: Goal: Adequate nutrition will be maintained Outcome: Progressing   Problem: Coping: Goal: Level of anxiety will decrease Outcome: Progressing   Problem: Elimination: Goal: Will not experience complications related to bowel motility Outcome: Progressing Goal: Will not experience complications related to urinary retention Outcome: Progressing   Problem: Pain Managment: Goal: General experience of comfort will improve and/or be controlled Outcome: Progressing   Problem: Safety: Goal: Ability to remain free from injury will improve Outcome: Progressing   Problem: Skin Integrity: Goal: Risk for impaired skin integrity will decrease Outcome: Progressing

## 2024-04-29 NOTE — Progress Notes (Signed)
 PROGRESS NOTE    Jose Patrick  WUJ:[id number] DOB: [birth year] DOA: 04/19/2024 PCP: [name]   Brief Narrative: Jose Patrick is a 73 y.o. male with a history of BPH, bipolar disorder, constipation, diabetes mellitus type 2, hyperlipidemia, PTSD, lumbar spinal stenosis.  Patient presented secondary to shortness of breath with evidence of recurrent pneumonia, rhinovirus positive, in addition to acute on chronic hypoxia. Pulmonology consulted. Bronchoscopy performed on 6/5.  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 Requiring up to 4 L/min this admission. CT evidence of right lower lung airspce disease in addition to developing diffuse nodular infiltration throught the left lung. Possible sequela from recurrent pneumonia vs radiation pneumonitis vs organizing pneumonia. Also concern for persistent cancer with lymphangitic spread. Pulmonology is consulted. Patient started empirically on steroids, Cefepime  and doxycycline . Bronchoscopy performed on 6/5, which does not suggest infection; pathology pending. Weaned to 3 L/min but had an episode of respiratory distress on 6/8 with need to increase up to 6 L/min of oxygen. BAL culture no growth to date. Cytology consistent with non-small cell lung cancer. -Continue prednisone  with taper -Pulmonology has signed off -Continue PPI -Wean to baseline 2 L/min as able-patient needs to ambulate - Oncology Dr. Colie Dawes and got an MRI and plans to get a PET outpatient  Rhinovirus pneumonia Noted. CT imaging suggesting pneumonia. -Continue supportive care  Hemoptysis In setting of recent bronchscopy. Aspirin  and Lovenox  still held. Discussed with pulmonology and patient received  tranexamic acid via nebulization. Patient with continued hemoptysis although there is some improvement. Pulmonology signed  off, status post TXA neb  Bilateral pleural effusions Noted on CT imaging. Right greater than left.  Lung cancer, recurrent History of remote left upper lobe lobectomy and concern for right lower lobe lung cancer s/p recent radiation. Concern for lymphangitic spread.  Bilateral toe pain Symptoms seem consistent with possible peripheral neuropathy. Confined to the tips. -Capsaicin cream  Diabetes mellitus type 2 with neuropathy Diabetes is well controlled for age with hemoglobin A1C of 7.8%. Patient is on solumedrol for management of respiratory failure. -Continue Semglee  and SSI  Primary hypertension -Continue Toprol  XL  CAD Hyperlipidemia -Continue Lipitor, Zetia , aspirin  (held for bronchoscopy) and Ranexa   GERD -Continue Protonix   OSA -Continue CPAP at bedtime  Chronic constipation -Continue MiraLAX   BPH Patient with lower urinary tract symptoms. Patient started on finasteride . -Continue finasteride   PTSD Bipolar disorder Noted. Not currently on medication therapy.  Lumbar stenosis Noted.  Morbid obesity, class II Estimated body mass index is 30 kg/m as calculated from the following:   Height as of this encounter: 5' 8 (1.727 m).   Weight as of this encounter: 89.5 kg.   DVT prophylaxis: Lovenox  (held for bronchoscopy and hemoptysis). SCDs. Code Status:   Code Status: Full Code Family Communication: None at bedside Disposition Plan: Discharge home pending improvement of hemoptysis, ability to wean oxygen, oncology consult   Consultants:  Pulmonology Oncology  Procedures:  Bronchoscopy     Subjective: Continues with intermittent hemoptysis-does get very short of breath with minimal exertion  Objective: BP (!) 141/68   Pulse 81   Temp (!) 97.5 F (36.4 C) (Oral)   Resp 19   Ht 5' 8 (1.727 m)   Wt 89.5 kg   SpO2 97%   BMI 30.00 kg/m   Examination:   General: Appearance:    Obese male  in no acute distress     Lungs:       respirations mildly labored  Heart:    Normal heart rate.   MS:   All extremities are intact.   Neurologic:   Awake, alert      Data Reviewed: I have personally reviewed following labs and imaging studies  CBC Lab Results  Component Value Date   WBC 9.2 04/29/2024   RBC 5.76 04/29/2024   HGB 15.1 04/29/2024   HCT 48.2 04/29/2024   MCV 83.7 04/29/2024   MCH 26.2 04/29/2024   PLT 282 04/29/2024   MCHC 31.3 04/29/2024   RDW 15.7 (H) 04/29/2024   LYMPHSABS 1.4 04/21/2024   MONOABS 0.8 04/21/2024   EOSABS 0.1 04/21/2024   BASOSABS 0.0 04/21/2024     Last metabolic panel Lab Results  Component Value Date   NA 137 04/29/2024   K 4.3 04/29/2024   CL 98 04/29/2024   CO2 31 04/29/2024   BUN 18 04/29/2024   CREATININE 0.86 04/29/2024   GLUCOSE 115 (H) 04/29/2024   GFRNONAA >60 04/29/2024   GFRAA  02/23/2009    >60        The eGFR has been calculated using the MDRD equation. This calculation has not been validated in all clinical situations. eGFR's persistently <60 mL/min signify possible Chronic Kidney Disease.   CALCIUM  9.4 04/29/2024   PROT 5.7 (L) 04/20/2024   ALBUMIN 2.6 (L) 04/20/2024   BILITOT 0.6 04/20/2024   ALKPHOS 47 04/20/2024   AST 18 04/20/2024   ALT 27 04/20/2024   ANIONGAP 8 04/29/2024    GFR: Estimated Creatinine Clearance: 84.3 mL/min (by C-G formula based on SCr of 0.86 mg/dL).  Recent Results (from the past 240 hours)  Resp panel by RT-PCR (RSV, Flu A&B, Covid) Anterior Nasal Swab     Status: None   Collection Time: 04/19/24  1:55 PM   Specimen: Anterior Nasal Swab  Result Value Ref Range Status   SARS Coronavirus 2 by RT PCR NEGATIVE NEGATIVE Final    Comment: (NOTE) SARS-CoV-2 target nucleic acids are NOT DETECTED.  The SARS-CoV-2 RNA is generally detectable in upper respiratory specimens during the acute phase of infection. The lowest concentration of SARS-CoV-2 viral copies this assay can detect is 138 copies/mL. A negative result  does not preclude SARS-Cov-2 infection and should not be used as the sole basis for treatment or other patient management decisions. A negative result may occur with  improper specimen collection/handling, submission of specimen other than nasopharyngeal swab, presence of viral mutation(s) within the areas targeted by this assay, and inadequate number of viral copies(<138 copies/mL). A negative result must be combined with clinical observations, patient history, and epidemiological information. The expected result is Negative.  Fact Sheet for Patients:  BloggerCourse.com  Fact Sheet for Healthcare Providers:  SeriousBroker.it  This test is no t yet approved or cleared by the [place]  FDA and  has been authorized for detection and/or diagnosis of SARS-CoV-2 by FDA under an Emergency Use Authorization (EUA). This EUA will remain  in effect (meaning this test can be used) for the duration of the COVID-19 declaration under Section 564(b)(1) of the Act, 21 U.S.C.section 360bbb-3(b)(1), unless the authorization is terminated  or revoked sooner.       Influenza A by PCR NEGATIVE NEGATIVE Final   Influenza B by PCR NEGATIVE NEGATIVE Final    Comment: (NOTE) The Xpert Xpress SARS-CoV-2/FLU/RSV plus assay is intended as an aid in the diagnosis of influenza from  Nasopharyngeal swab specimens and should not be used as a sole basis for treatment. Nasal washings and aspirates are unacceptable for Xpert Xpress SARS-CoV-2/FLU/RSV testing.  Fact Sheet for Patients: BloggerCourse.com  Fact Sheet for Healthcare Providers: SeriousBroker.it  This test is not yet approved or cleared by the [place]  FDA and has been authorized for detection and/or diagnosis of SARS-CoV-2 by FDA under an Emergency Use Authorization (EUA). This EUA will remain in effect (meaning this test can be used) for  the duration of the COVID-19 declaration under Section 564(b)(1) of the Act, 21 U.S.C. section 360bbb-3(b)(1), unless the authorization is terminated or revoked.     Resp Syncytial Virus by PCR NEGATIVE NEGATIVE Final    Comment: (NOTE) Fact Sheet for Patients: BloggerCourse.com  Fact Sheet for Healthcare Providers: SeriousBroker.it  This test is not yet approved or cleared by the [place]  FDA and has been authorized for detection and/or diagnosis of SARS-CoV-2 by FDA under an Emergency Use Authorization (EUA). This EUA will remain in effect (meaning this test can be used) for the duration of the COVID-19 declaration under Section 564(b)(1) of the Act, 21 U.S.C. section 360bbb-3(b)(1), unless the authorization is terminated or revoked.  Performed at [facility], [address], [place], [place] [address]   Blood Culture (routine x 2)     Status: None   Collection Time: 04/19/24  2:04 PM   Specimen: BLOOD  Result Value Ref Range Status   Specimen Description   Final    BLOOD RIGHT ANTECUBITAL Performed at [facility], [address], [place], [place] [address]    Special Requests   Final    BOTTLES DRAWN AEROBIC AND ANAEROBIC Blood Culture results may not be optimal due to an inadequate volume of blood received in culture bottles Performed at [facility], [address], [place], [place] [address]    Culture   Final    NO GROWTH 5 DAYS Performed at [facility], [address], [place], [place] [address]    Report Status 04/24/2024 FINAL  Final  Blood Culture (routine x 2)     Status: None   Collection Time: 04/19/24  2:26 PM   Specimen: BLOOD  Result Value Ref Range Status   Specimen Description   Final    BLOOD LEFT ANTECUBITAL Performed at [facility], [address] [place], [place] [address]    Special Requests   Final     BOTTLES DRAWN AEROBIC AND ANAEROBIC Blood Culture results may not be optimal due to an inadequate volume of blood received in culture bottles Performed at [facility], [address] [place], [place] [address]    Culture   Final    NO GROWTH 5 DAYS Performed at [facility], [address] [place], [place] [address]    Report Status 04/24/2024 FINAL  Final  Respiratory (~20 pathogens) panel by PCR     Status: Abnormal   Collection Time: 04/19/24 11:09 PM   Specimen: Nasal Mucosa; Respiratory  Result Value Ref Range Status   Adenovirus NOT DETECTED NOT DETECTED Final   Coronavirus 229E NOT DETECTED NOT DETECTED Final    Comment: (NOTE) The Coronavirus on the Respiratory Panel, DOES NOT test for the novel  Coronavirus (2019 nCoV)    Coronavirus HKU1 NOT DETECTED NOT DETECTED Final   Coronavirus NL63 NOT DETECTED NOT DETECTED Final   Coronavirus OC43 NOT DETECTED NOT DETECTED Final   Metapneumovirus NOT  DETECTED NOT DETECTED Final   Rhinovirus / Enterovirus DETECTED (A) NOT DETECTED Final   Influenza A NOT DETECTED NOT DETECTED Final   Influenza B NOT DETECTED NOT DETECTED Final   Parainfluenza Virus 1 NOT DETECTED NOT DETECTED Final   Parainfluenza Virus 2 NOT DETECTED NOT DETECTED Final   Parainfluenza Virus 3 NOT DETECTED NOT DETECTED Final   Parainfluenza Virus 4 NOT DETECTED NOT DETECTED Final   Respiratory Syncytial Virus NOT DETECTED NOT DETECTED Final   Bordetella pertussis NOT DETECTED NOT DETECTED Final   Bordetella Parapertussis NOT DETECTED NOT DETECTED Final   Chlamydophila pneumoniae NOT DETECTED NOT DETECTED Final   Mycoplasma pneumoniae NOT DETECTED NOT DETECTED Final    Comment: Performed at [facility], [address], [place], [place] [address]  MRSA Next Gen by PCR, Nasal     Status: None   Collection Time: 04/19/24 11:09 PM   Specimen: Nasal Mucosa; Nasal Swab  Result Value Ref Range Status   MRSA by PCR Next Gen NOT DETECTED  NOT DETECTED Final    Comment: (NOTE) The GeneXpert MRSA Assay (FDA approved for NASAL specimens only), is one component of a comprehensive MRSA colonization surveillance program. It is not intended to diagnose MRSA infection nor to guide or monitor treatment for MRSA infections. Test performance is not FDA approved in patients less than 66 years old. Performed at [facility], [address], [place], [place] [address]   Body fluid culture w Gram Stain     Status: None   Collection Time: 04/20/24 11:47 AM   Specimen: Pleural Fluid  Result Value Ref Range Status   Specimen Description   Final    FLUID RIGHT PLEURAL Performed at [facility], [address] [place], [place] [address]    Special Requests   Final    NONE Performed at [facility], [address] [place], [place] [address]    Gram Stain   Final    ABUNDANT WBC PRESENT, PREDOMINANTLY MONONUCLEAR NO ORGANISMS SEEN    Culture   Final    NO GROWTH 3 DAYS Performed at [facility], [address], [place], [place] [address]    Report Status 04/23/2024 FINAL  Final  Fungus Culture With Stain     Status: None (Preliminary result)   Collection Time: 04/22/24 12:16 PM   Specimen: Bronchial Alveolar Lavage; Respiratory  Result Value Ref Range Status   Fungus Stain Final report  Final    Comment: (NOTE) Performed At: [facility] [address] [place], [place] [id number] Pearlean Botts MD UX:[id number]    Fungus (Mycology) Culture PENDING  Incomplete   Fungal Source BRONCHIAL ALVEOLAR LAVAGE  Final    Comment: Performed at [facility], [address] [place], [place] [address]  Culture, BAL-quantitative w Gram Stain     Status: None   Collection Time: 04/22/24 12:16 PM   Specimen: Bronchial Alveolar Lavage; Respiratory  Result Value Ref Range Status   Specimen Description   Final    BRONCHIAL ALVEOLAR LAVAGE Performed at  [facility], [address] [place], [place] [address]    Special Requests   Final    NONE Performed at [facility], [address] [place], [place] [address]    Gram Stain NO WBC SEEN NO ORGANISMS SEEN   Final   Culture   Final    NO GROWTH 2 DAYS Performed at [facility],  [address], [place], [place] [address]    Report Status 04/25/2024 FINAL  Final  Aerobic/Anaerobic Culture w Gram Stain (surgical/deep wound)     Status: None   Collection Time: 04/22/24 12:16 PM   Specimen: Bronchial Alveolar Lavage; Respiratory  Result Value Ref Range Status   Specimen Description   Final    BRONCHIAL ALVEOLAR LAVAGE Performed at [facility], [address], [place], [place] [address]    Special Requests   Final    NONE Performed at [facility], [address], [place], [place] [address]    Gram Stain NO WBC SEEN NO ORGANISMS SEEN   Final   Culture   Final    No growth aerobically or anaerobically. Performed at [facility], [address] [place], [place] [address]    Report Status 04/27/2024 FINAL  Final  Acid Fast Smear (AFB)     Status: None   Collection Time: 04/22/24 12:16 PM   Specimen: Bronchial Alveolar Lavage; Respiratory  Result Value Ref Range Status   AFB Specimen Processing Concentration  Final   Acid Fast Smear Negative  Final    Comment: (NOTE) Performed At: [facility] [address] [place], [place] [id number] Pearlean Botts MD DG:[id number]    Source (AFB) BRONCHIAL ALVEOLAR LAVAGE  Final    Comment: Performed at [facility], [address], [place], [place] [address]  Fungus Culture Result     Status: None   Collection Time: 04/22/24 12:16 PM  Result Value Ref Range Status   Result 1 Comment  Final    Comment: (NOTE) KOH/Calcofluor preparation:  no fungus observed. Performed At: [facility] [address] [place],  [place] [id number] Pearlean Botts MD JO:[id number]       Radiology Studies: MR BRAIN WO CONTRAST Result Date: 04/28/2024 CLINICAL DATA:  Non-small cell lung carcinoma staging EXAM: MRI HEAD WITHOUT CONTRAST TECHNIQUE: Multiplanar, multiecho pulse sequences of the brain and surrounding structures were obtained without intravenous contrast. COMPARISON:  06/30/2023 FINDINGS: Brain: No acute infarct, mass effect or extra-axial collection. No acute or chronic hemorrhage. There is multifocal hyperintense T2-weighted signal within the white matter. Generalized volume loss. The midline structures are normal. Lack of intravenous contrast agent administration limits sensitivity for the detection of metastases. Vascular: Normal flow voids. Skull and upper cervical spine: Normal calvarium and skull base. Visualized upper cervical spine and soft tissues are normal. Sinuses/Orbits:No paranasal sinus fluid levels or advanced mucosal thickening. No mastoid or middle ear effusion. Normal orbits. IMPRESSION: 1. Lack of intravenous contrast agent administration limits sensitivity for the detection of metastases. 2. No acute intracranial abnormality. 3. Findings of chronic small vessel ischemia and volume loss. Electronically Signed   By: Juanetta Nordmann M.D.   On: 04/28/2024 18:19        LOS: 10 days    Katrina Parma DO [facility] 04/29/2024, 11:12 AM   If 7PM-7AM, please contact night-coverage [email or web address]

## 2024-04-29 NOTE — TOC Progression Note (Addendum)
 Transition of Care [facility]) - Progression Note    Patient Details  Name: Jose Patrick MRN: [id number] Date of Birth: [birth year]  Transition of [facility]) CM/SW Contact  Jonni Nettle, LCSW Phone Number: 04/29/2024, 12:57 PM  Clinical Narrative:    CSW met with pt at bedside to discuss DME needs. Pt reports need for electric wheelchair for his oxygen tank. Pt reports his social worker, Peterson Brandt [phone], at [facility], is aware of request. Per MD, pt will need to order electric wheelchair through his PCP at [facility]. [facility] and [facility] social worker made aware.   Expected Discharge Plan: Home w Home Health Services Barriers to Discharge: Continued Medical Work up  Expected Discharge Plan and Services In-house Referral: Clinical Social Work Discharge Planning Services: NA Post Acute Care Choice: Home Health Living arrangements for the past 2 months: Single Family Home                 DME Arranged: N/A DME Agency: NA       HH Arranged: PT, OT HH Agency: Other - See comment Producer, television/film/video Home Health) Date HH Agency Contacted: 04/28/24 Time HH Agency Contacted: 1022 Representative spoke with at [facility] Agency: Shelvy Dickens   Social Determinants of Health (SDOH) Interventions SDOH Screenings   Food Insecurity: No Food Insecurity (04/20/2024)  Housing: Low Risk  (04/20/2024)  Transportation Needs: No Transportation Needs (04/20/2024)  Utilities: Not At Risk (04/20/2024)  Depression (PHQ2-9): Low Risk  (11/20/2021)  Financial Resource Strain: Low Risk  (12/02/2023)   Received from [facility]  Physical Activity: Unknown (03/17/2024)   Received from [facility]  Social Connections: Moderately Integrated (04/20/2024)  Stress: Stress Concern Present (03/08/2024)   Received from [facility]  Tobacco Use: Medium Risk (04/22/2024)    Readmission Risk Interventions    04/20/2024   11:08 AM 04/02/2024    1:08 PM  Readmission Risk Prevention Plan  Transportation Screening Complete  Complete  PCP or Specialist Appt within 5-7 Days  Complete  PCP or Specialist Appt within 3-5 Days Complete   Home Care Screening  Complete  Medication Review (RN CM)  Complete  HRI or Home Care Consult Complete   Social Work Consult for Recovery Care Planning/Counseling Complete   Palliative Care Screening Not Applicable   Medication Review (RN Care Manager) Complete     [place], MSW, LCSW 04/29/2024 2:29 PM

## 2024-04-30 DIAGNOSIS — J189 Pneumonia, unspecified organism: Secondary | ICD-10-CM | POA: Diagnosis not present

## 2024-04-30 DIAGNOSIS — J9621 Acute and chronic respiratory failure with hypoxia: Secondary | ICD-10-CM | POA: Diagnosis not present

## 2024-04-30 LAB — GLUCOSE, CAPILLARY
Glucose-Capillary: 176 mg/dL — ABNORMAL HIGH (ref 70–99)
Glucose-Capillary: 200 mg/dL — ABNORMAL HIGH (ref 70–99)
Glucose-Capillary: 311 mg/dL — ABNORMAL HIGH (ref 70–99)
Glucose-Capillary: 300 mg/dL — ABNORMAL HIGH (ref 70–99)

## 2024-04-30 MED ORDER — FUROSEMIDE 10 MG/ML IJ SOLN
20.0000 mg | Freq: Once | INTRAMUSCULAR | Status: AC
  Administered 2024-04-30: 20 mg via INTRAVENOUS
  Filled 2024-04-30: qty 2

## 2024-04-30 NOTE — Progress Notes (Signed)
 Physical Therapy Treatment Patient Details Name: Jose Patrick MRN: [id number] DOB: Mar 21, 1951 Today's Date: 04/30/2024   History of Present Illness 73 yo M  who presented to [facility] ED 6/2 for eval of persistent cough, acute on chronic resp failure, rhinovirus PNE. 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 Recent admission 5/15/-04/07/24 for PNA.    PT Comments  Pt reports feeling much better today.  He was able to ambulate 300' with RW and supervision/CGA.  Did have episode of dizziness at 150' when stopped for rest, breathing, looking at pulse ox, but was able to resume walking.  He needed 6 L O2 for activity with sats 88% at lowest and on 5 L rest with sats 93% lowest.  Cont POC.  Encouraged use of rollator at home for rest breaks.     If plan is discharge home, recommend the following: Assistance with cooking/housework;Help with stairs or ramp for entrance   Can travel by private vehicle        Equipment Recommendations  None recommended by PT    Recommendations for Other Services       Precautions / Restrictions Precautions Precautions: Fall Precaution/Restrictions Comments: O2 dep @ baseline     Mobility  Bed Mobility               General bed mobility comments: up in recliner    Transfers Overall transfer level: Needs assistance Equipment used: Rolling walker (2 wheels) Transfers: Sit to/from Stand Sit to Stand: Supervision                Ambulation/Gait Ambulation/Gait assistance: Contact guard assist, Supervision Gait Distance (Feet): 300 Feet Assistive device: Rolling walker (2 wheels) Gait Pattern/deviations: Step-through pattern Gait velocity: decreased     General Gait Details: Initially with supervision but at 150' reports some dizziness so switched to CGA.  At 150' had stopped for rest break, pt looking down at pulse ox and taking deep breaths, dizziness developed and mild LOB , pt reports he was ok and able to resume  walking 150' back to room.  RN available and did follow with transport chair for safety.   Stairs             Wheelchair Mobility     Tilt Bed    Modified Rankin (Stroke Patients Only)       Balance Overall balance assessment: Needs assistance Sitting-balance support: No upper extremity supported Sitting balance-Leahy Scale: Normal     Standing balance support: Bilateral upper extremity supported, No upper extremity supported   Standing balance comment: RW to ambulate but could static stand without support                            Communication    Cognition Arousal: Alert Behavior During Therapy: WFL for tasks assessed/performed   PT - Cognitive impairments: No apparent impairments                       PT - Cognition Comments: AxO x 3 pleasant and willing        Cueing    Exercises      General Comments General comments (skin integrity, edema, etc.): Pt on 5 L O2 rest with sats 95%.  6 L O2 with ambulate with sats 88% at lowest recovering in <1 minute. Back on 5 L end of session sats 93%.  Declined further exercise.  Reports walked once today  and doing his flutter.      Pertinent Vitals/Pain Pain Assessment Pain Assessment: No/denies pain    Home Living                          Prior Function            PT Goals (current goals can now be found in the care plan section) Progress towards PT goals: Progressing toward goals    Frequency    Min 2X/week      PT Plan      Co-evaluation              AM-PAC PT 6 Clicks Mobility   Outcome Measure  Help needed turning from your back to your side while in a flat bed without using bedrails?: A Little Help needed moving from lying on your back to sitting on the side of a flat bed without using bedrails?: A Little Help needed moving to and from a bed to a chair (including a wheelchair)?: A Little Help needed standing up from a chair using your arms (e.g.,  wheelchair or bedside chair)?: A Little Help needed to walk in hospital room?: A Little Help needed climbing 3-5 steps with a railing? : A Little 6 Click Score: 18    End of Session Equipment Utilized During Treatment: Gait belt;Oxygen Activity Tolerance: Patient tolerated treatment well Patient left: in chair;with call bell/phone within reach Nurse Communication: Mobility status PT Visit Diagnosis: Unsteadiness on feet (R26.81)     Time: 1343-1400 PT Time Calculation (min) (ACUTE ONLY): 17 min  Charges:    $Gait Training: 8-22 mins PT General Charges $$ ACUTE PT VISIT: 1 Visit                     Cyd Dowse, PT Acute Rehab Services  Rehab [phone]    Carolynn Citrin 04/30/2024, 2:10 PM

## 2024-04-30 NOTE — Plan of Care (Signed)
  Problem: Education: Goal: Ability to describe self-care measures that may prevent or decrease complications (Diabetes Survival Skills Education) will improve Outcome: Progressing   Problem: Coping: Goal: Ability to adjust to condition or change in health will improve Outcome: Progressing   Problem: Fluid Volume: Goal: Ability to maintain a balanced intake and output will improve Outcome: Progressing   Problem: Health Behavior/Discharge Planning: Goal: Ability to identify and utilize available resources and services will improve Outcome: Progressing   Problem: Nutritional: Goal: Maintenance of adequate nutrition will improve Outcome: Progressing

## 2024-04-30 NOTE — Plan of Care (Signed)
  Problem: Clinical Measurements: Goal: Respiratory complications will improve Outcome: Progressing   Problem: Nutrition: Goal: Adequate nutrition will be maintained Outcome: Progressing   Problem: Safety: Goal: Ability to remain free from injury will improve Outcome: Progressing   

## 2024-04-30 NOTE — Progress Notes (Signed)
 PROGRESS NOTE    Jose Patrick  ZOX:[id number] DOB: 11-Jul-1951 DOA: 04/19/2024 PCP: [name]   Brief Narrative: Jose Patrick is a 73 y.o. male with a history of BPH, bipolar disorder, constipation, diabetes mellitus type 2, hyperlipidemia, PTSD, lumbar spinal stenosis.  Patient presented secondary to shortness of breath with evidence of recurrent pneumonia, rhinovirus positive, in addition to acute on chronic hypoxia. Pulmonology consulted. Bronchoscopy performed on 6/5.  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 Requiring up to 4 L/min this admission. CT evidence of right lower lung airspce disease in addition to developing diffuse nodular infiltration throught the left lung. Possible sequela from recurrent pneumonia vs radiation pneumonitis vs organizing pneumonia. Also concern for persistent cancer with lymphangitic spread. Pulmonology is consulted. Patient started empirically on steroids, Cefepime  and doxycycline . Bronchoscopy performed on 6/5, which does not suggest infection; pathology pending. Weaned to 3 L/min but had an episode of respiratory distress on 6/8 with need to increase up to 6 L/min of oxygen. BAL culture no growth to date. Cytology consistent with non-small cell lung cancer. -Continue prednisone  with taper -Pulmonology has signed off -Continue PPI -Wean to baseline 2 L/min as able-patient needs to ambulate - Oncology Dr. Colie Dawes and got an MRI and plans to get a PET outpatient  Rhinovirus pneumonia Noted. CT imaging suggesting pneumonia. -Continue supportive care  Hemoptysis In setting of recent bronchscopy. Aspirin  and Lovenox  still held. Discussed with pulmonology and patient received  tranexamic acid  via nebulization. Patient with continued hemoptysis although there is some improvement. Pulmonology signed  off, status post TXA neb  Bilateral pleural effusions Noted on CT imaging. Right greater than left.  Lung cancer, recurrent History of remote left upper lobe lobectomy and concern for right lower lobe lung cancer s/p recent radiation. Concern for lymphangitic spread.  Bilateral toe pain Symptoms seem consistent with possible peripheral neuropathy. Confined to the tips. -Capsaicin  cream  Diabetes mellitus type 2 with neuropathy Diabetes is well controlled for age with hemoglobin A1C of 7.8%. Patient is on solumedrol for management of respiratory failure. -Continue Semglee  and SSI  Primary hypertension -Continue Toprol  XL  CAD Hyperlipidemia -Continue Lipitor, Zetia , aspirin  (held for bronchoscopy) and Ranexa   GERD -Continue Protonix   OSA -Continue CPAP at bedtime  Chronic constipation -Continue MiraLAX   BPH Patient with lower urinary tract symptoms. Patient started on finasteride . -Continue finasteride   PTSD Bipolar disorder Noted. Not currently on medication therapy.  Lumbar stenosis Noted.  Morbid obesity, class II Estimated body mass index is 30 kg/m as calculated from the following:   Height as of this encounter: 5' 8 (1.727 m).   Weight as of this encounter: 89.5 kg.   DVT prophylaxis: Lovenox  (held for bronchoscopy and hemoptysis). SCDs. Code Status:   Code Status: Full Code Family Communication: None at bedside Disposition Plan: Discharge home pending improvement of hemoptysis, ability to wean oxygen, oncology consult   Consultants:  Pulmonology Oncology  Procedures:  Bronchoscopy     Subjective: Patient's main complaint is the shortness of breath with exertion so he has not been walking like he knows he should  Objective: BP (!) 148/62 (BP Location: Left Arm)   Pulse 65   Temp 98 F (36.7 C)   Resp (!) 22   Ht 5' 8 (1.727 m)   Wt 89.5 kg   SpO2 97%   BMI 30.00 kg/m  Examination:   General: Appearance:    Obese male in no  acute distress     Lungs:      respirations mildly labored  Heart:    Normal heart rate.   MS:   All extremities are intact.   Neurologic:   Awake, alert      Data Reviewed: I have personally reviewed following labs and imaging studies  CBC Lab Results  Component Value Date   WBC 9.2 04/29/2024   RBC 5.76 04/29/2024   HGB 15.1 04/29/2024   HCT 48.2 04/29/2024   MCV 83.7 04/29/2024   MCH 26.2 04/29/2024   PLT 282 04/29/2024   MCHC 31.3 04/29/2024   RDW 15.7 (H) 04/29/2024   LYMPHSABS 1.4 04/21/2024   MONOABS 0.8 04/21/2024   EOSABS 0.1 04/21/2024   BASOSABS 0.0 04/21/2024     Last metabolic panel Lab Results  Component Value Date   NA 137 04/29/2024   K 4.3 04/29/2024   CL 98 04/29/2024   CO2 31 04/29/2024   BUN 18 04/29/2024   CREATININE 0.86 04/29/2024   GLUCOSE 115 (H) 04/29/2024   GFRNONAA >60 04/29/2024   GFRAA  02/23/2009    >60        The eGFR has been calculated using the MDRD equation. This calculation has not been validated in all clinical situations. eGFR's persistently <60 mL/min signify possible Chronic Kidney Disease.   CALCIUM  9.4 04/29/2024   PROT 5.7 (L) 04/20/2024   ALBUMIN 2.6 (L) 04/20/2024   BILITOT 0.6 04/20/2024   ALKPHOS 47 04/20/2024   AST 18 04/20/2024   ALT 27 04/20/2024   ANIONGAP 8 04/29/2024    GFR: Estimated Creatinine Clearance: 84.3 mL/min (by C-G formula based on SCr of 0.86 mg/dL).  Recent Results (from the past 240 hours)  Fungus Culture With Stain     Status: None (Preliminary result)   Collection Time: 04/22/24 12:16 PM   Specimen: Bronchial Alveolar Lavage; Respiratory  Result Value Ref Range Status   Fungus Stain Final report  Final    Comment: (NOTE) Performed At: [facility] [address] [place], [place] [id number] Pearlean Botts MD IH:[id number]    Fungus (Mycology) Culture PENDING  Incomplete   Fungal Source BRONCHIAL ALVEOLAR LAVAGE  Final    Comment: Performed at [facility], [address], [place], [place] [address]  Culture, BAL-quantitative w Gram Stain     Status: None   Collection Time: 04/22/24 12:16 PM   Specimen: Bronchial Alveolar Lavage; Respiratory  Result Value Ref Range Status   Specimen Description   Final    BRONCHIAL ALVEOLAR LAVAGE Performed at [facility], [address] [place], [place] [address]    Special Requests   Final    NONE Performed at [facility], [address], [place], [place] [address]    Gram Stain NO WBC SEEN NO ORGANISMS SEEN   Final   Culture   Final    NO GROWTH 2 DAYS Performed at [facility], [address] [place], [place] [address]    Report Status 04/25/2024 FINAL  Final  Aerobic/Anaerobic Culture w Gram Stain (surgical/deep wound)     Status: None   Collection Time: 04/22/24 12:16 PM   Specimen: Bronchial Alveolar Lavage; Respiratory  Result Value Ref Range Status   Specimen Description   Final    BRONCHIAL ALVEOLAR LAVAGE Performed at [facility], [address], [place], [place] [address]  Special Requests   Final    NONE Performed at [facility], [address], [place], [place] [address]    Gram Stain NO WBC SEEN NO ORGANISMS SEEN   Final   Culture   Final    No growth aerobically or anaerobically. Performed at [facility], [address], [place], [place] [address]    Report Status 04/27/2024 FINAL  Final  Acid Fast Smear (AFB)     Status: None   Collection Time: 04/22/24 12:16 PM   Specimen: Bronchial Alveolar Lavage; Respiratory  Result Value Ref Range Status   AFB Specimen Processing Concentration  Final   Acid Fast Smear Negative  Final    Comment: (NOTE) Performed At: [facility] [address] [place], [place] [id number] Pearlean Botts MD IR:[id number]    Source (AFB) BRONCHIAL ALVEOLAR LAVAGE  Final    Comment: Performed at [facility], [address] [place], [place] [address]  Fungus Culture Result     Status: None   Collection Time: 04/22/24 12:16 PM  Result Value Ref Range Status   Result 1 Comment  Final    Comment: (NOTE) KOH/Calcofluor preparation:  no fungus observed. Performed At: [facility] [address] [place], [place] [id number] Pearlean Botts MD DU:[id number]       Radiology Studies: MR BRAIN WO CONTRAST Result Date: 04/28/2024 CLINICAL DATA:  Non-small cell lung carcinoma staging EXAM: MRI HEAD WITHOUT CONTRAST TECHNIQUE: Multiplanar, multiecho pulse sequences of the brain and surrounding structures were obtained without intravenous contrast. COMPARISON:  06/30/2023 FINDINGS: Brain: No acute infarct, mass effect or extra-axial collection. No acute or chronic hemorrhage. There is multifocal hyperintense T2-weighted signal within the white matter. Generalized volume loss. The midline structures are normal. Lack of intravenous contrast agent administration limits sensitivity for the detection of metastases. Vascular: Normal flow voids. Skull and upper cervical spine: Normal calvarium and skull base. Visualized upper cervical spine and soft tissues are normal. Sinuses/Orbits:No paranasal sinus fluid levels or advanced mucosal thickening. No mastoid or middle ear effusion. Normal orbits. IMPRESSION: 1. Lack of intravenous contrast agent administration limits sensitivity for the detection of metastases. 2. No acute intracranial abnormality. 3. Findings of chronic small vessel ischemia and volume loss. Electronically Signed   By: Juanetta Nordmann M.D.   On: 04/28/2024 18:19        LOS: 11 days    Katrina Parma DO [facility] 04/30/2024, 12:09 PM   If 7PM-7AM, please contact night-coverage [email or web address]

## 2024-04-30 NOTE — Plan of Care (Signed)
   Problem: Education: Goal: Ability to describe self-care measures that may prevent or decrease complications (Diabetes Survival Skills Education) will improve Outcome: Progressing Goal: Individualized Educational Video(s) Outcome: Progressing   Problem: Coping: Goal: Ability to adjust to condition or change in health will improve Outcome: Progressing   Problem: Fluid Volume: Goal: Ability to maintain a balanced intake and output will improve Outcome: Progressing   Problem: Health Behavior/Discharge Planning: Goal: Ability to identify and utilize available resources and services will improve Outcome: Progressing Goal: Ability to manage health-related needs will improve Outcome: Progressing   Problem: Metabolic: Goal: Ability to maintain appropriate glucose levels will improve Outcome: Progressing   Problem: Nutritional: Goal: Maintenance of adequate nutrition will improve Outcome: Progressing Goal: Progress toward achieving an optimal weight will improve Outcome: Progressing   Problem: Skin Integrity: Goal: Risk for impaired skin integrity will decrease Outcome: Progressing   Problem: Tissue Perfusion: Goal: Adequacy of tissue perfusion will improve Outcome: Progressing   Problem: Activity: Goal: Ability to tolerate increased activity will improve Outcome: Progressing   Problem: Clinical Measurements: Goal: Ability to maintain a body temperature in the normal range will improve Outcome: Progressing   Problem: Respiratory: Goal: Ability to maintain adequate ventilation will improve Outcome: Progressing Goal: Ability to maintain a clear airway will improve Outcome: Progressing   Problem: Education: Goal: Knowledge of General Education information will improve Description: Including pain rating scale, medication(s)/side effects and non-pharmacologic comfort measures Outcome: Progressing   Problem: Health Behavior/Discharge Planning: Goal: Ability to manage  health-related needs will improve Outcome: Progressing   Problem: Clinical Measurements: Goal: Ability to maintain clinical measurements within normal limits will improve Outcome: Progressing Goal: Will remain free from infection Outcome: Progressing Goal: Diagnostic test results will improve Outcome: Progressing Goal: Respiratory complications will improve Outcome: Progressing Goal: Cardiovascular complication will be avoided Outcome: Progressing   Problem: Activity: Goal: Risk for activity intolerance will decrease Outcome: Progressing   Problem: Nutrition: Goal: Adequate nutrition will be maintained Outcome: Progressing   Problem: Coping: Goal: Level of anxiety will decrease Outcome: Progressing   Problem: Elimination: Goal: Will not experience complications related to bowel motility Outcome: Progressing Goal: Will not experience complications related to urinary retention Outcome: Progressing   Problem: Pain Managment: Goal: General experience of comfort will improve and/or be controlled Outcome: Progressing   Problem: Safety: Goal: Ability to remain free from injury will improve Outcome: Progressing   Problem: Skin Integrity: Goal: Risk for impaired skin integrity will decrease Outcome: Progressing

## 2024-04-30 NOTE — Progress Notes (Signed)
   04/30/24 0002  BiPAP/CPAP/SIPAP  BiPAP/CPAP/SIPAP Pt Type Adult  BiPAP/CPAP/SIPAP DREAMSTATIOND (bileval)  Mask Type Full face mask  Dentures removed? Not applicable  Mask Size Large  Set Rate 0 breaths/min  Respiratory Rate 18 breaths/min  IPAP 10 cmH20  EPAP 5 cmH2O  FiO2 (%) 40 %  Flow Rate 5 lpm  Patient Home Machine No  Patient Home Mask No  Patient Home Tubing No  Auto Titrate No  Press High Alarm 30 cmH2O  Press Low Alarm 5 cmH2O  Device Plugged into RED Power Outlet Yes

## 2024-04-30 NOTE — Progress Notes (Signed)
 Pt only able to titrate down to 5L with sats at 93%.

## 2024-05-01 DIAGNOSIS — J9621 Acute and chronic respiratory failure with hypoxia: Secondary | ICD-10-CM | POA: Diagnosis not present

## 2024-05-01 DIAGNOSIS — J189 Pneumonia, unspecified organism: Secondary | ICD-10-CM | POA: Diagnosis not present

## 2024-05-01 LAB — BASIC METABOLIC PANEL WITH GFR
Anion gap: 10 (ref 5–15)
BUN: 17 mg/dL (ref 8–23)
CO2: 32 mmol/L (ref 22–32)
Calcium: 9.4 mg/dL (ref 8.9–10.3)
Chloride: 95 mmol/L — ABNORMAL LOW (ref 98–111)
Creatinine, Ser: 0.85 mg/dL (ref 0.61–1.24)
GFR, Estimated: >60 mL/min (ref >60)
Glucose, Bld: 135 mg/dL — ABNORMAL HIGH (ref 70–99)
Potassium: 5 mmol/L (ref 3.5–5.1)
Sodium: 137 mmol/L (ref 135–145)

## 2024-05-01 LAB — CBC
HCT: 49.5 % (ref 39.0–52.0)
Hemoglobin: 15.4 g/dL (ref 13.0–17.0)
MCH: 26 pg (ref 26.0–34.0)
MCHC: 31.1 g/dL (ref 30.0–36.0)
MCV: 83.6 fL (ref 80.0–100.0)
Platelets: 275 10*3/uL (ref 150–400)
RBC: 5.92 MIL/uL — ABNORMAL HIGH (ref 4.22–5.81)
RDW: 16 % — ABNORMAL HIGH (ref 11.5–15.5)
WBC: 12 10*3/uL — ABNORMAL HIGH (ref 4.0–10.5)
nRBC: 0 % (ref 0.0–0.2)

## 2024-05-01 LAB — GLUCOSE, CAPILLARY
Glucose-Capillary: 128 mg/dL — ABNORMAL HIGH (ref 70–99)
Glucose-Capillary: 218 mg/dL — ABNORMAL HIGH (ref 70–99)
Glucose-Capillary: 202 mg/dL — ABNORMAL HIGH (ref 70–99)
Glucose-Capillary: 262 mg/dL — ABNORMAL HIGH (ref 70–99)

## 2024-05-01 MED ORDER — LOSARTAN POTASSIUM 25 MG PO TABS
12.5000 mg | ORAL_TABLET | Freq: Every day | ORAL | Status: DC
  Administered 2024-05-02: 12.5 mg via ORAL
  Filled 2024-05-01 (×2): qty 0.5

## 2024-05-01 MED ORDER — EMPAGLIFLOZIN 25 MG PO TABS
25.0000 mg | ORAL_TABLET | Freq: Every day | ORAL | Status: DC
  Filled 2024-05-01: qty 1

## 2024-05-01 MED ORDER — FUROSEMIDE 10 MG/ML IJ SOLN
20.0000 mg | Freq: Once | INTRAMUSCULAR | Status: AC
  Filled 2024-05-01: qty 2

## 2024-05-01 NOTE — Progress Notes (Signed)
   05/01/24 1953  BiPAP/CPAP/SIPAP  BiPAP/CPAP/SIPAP Pt Type Adult (prefers self placement)  BiPAP/CPAP/SIPAP DREAMSTATIOND  Mask Type Full face mask  Dentures removed? Not applicable  Mask Size Large  IPAP 10 cmH20  EPAP 5 cmH2O  Flow Rate 6 lpm  Patient Home Machine No  Patient Home Mask No  Patient Home Tubing No  Auto Titrate No  CPAP/SIPAP surface wiped down Yes  Device Plugged into RED Power Outlet Yes  BiPAP/CPAP /SiPAP Vitals  Pulse Rate 79  Resp 19  SpO2 95 %  Bilateral Breath Sounds Clear;Diminished  MEWS Score/Color  MEWS Score 0  MEWS Score Color Marrie Sizer

## 2024-05-01 NOTE — Progress Notes (Signed)
 Mobility Specialist - Progress Note   05/01/24 1044  Oxygen Therapy  SpO2 90 %  O2 Device Nasal Cannula  O2 Flow Rate (L/min) 6 L/min  Patient Activity (if Appropriate) Ambulating  Mobility  Activity Ambulated with assistance in hallway  Level of Assistance Standby assist, set-up cues, supervision of patient - no hands on  Assistive Device Front wheel walker  Distance Ambulated (ft) 230 ft  Activity Response Tolerated well  Mobility Referral Yes  Mobility visit 1 Mobility  Mobility Specialist Start Time (ACUTE ONLY) 1029  Mobility Specialist Stop Time (ACUTE ONLY) 1043  Mobility Specialist Time Calculation (min) (ACUTE ONLY) 14 min   Pt received in bed and agreeable to mobility. No complaints during session. Pt to bed after session with all needs met.    Pre-mobility: 90% SpO2 (6L [place])  During mobility: 95%  SpO2 (6L [place]) Post-mobility: 92% SPO2 (6L [place])  Chief Technology Officer

## 2024-05-01 NOTE — Plan of Care (Signed)
   Problem: Education: Goal: Ability to describe self-care measures that may prevent or decrease complications (Diabetes Survival Skills Education) will improve Outcome: Progressing Goal: Individualized Educational Video(s) Outcome: Progressing   Problem: Coping: Goal: Ability to adjust to condition or change in health will improve Outcome: Progressing   Problem: Fluid Volume: Goal: Ability to maintain a balanced intake and output will improve Outcome: Progressing   Problem: Health Behavior/Discharge Planning: Goal: Ability to identify and utilize available resources and services will improve Outcome: Progressing Goal: Ability to manage health-related needs will improve Outcome: Progressing   Problem: Metabolic: Goal: Ability to maintain appropriate glucose levels will improve Outcome: Progressing   Problem: Nutritional: Goal: Maintenance of adequate nutrition will improve Outcome: Progressing Goal: Progress toward achieving an optimal weight will improve Outcome: Progressing   Problem: Skin Integrity: Goal: Risk for impaired skin integrity will decrease Outcome: Progressing   Problem: Tissue Perfusion: Goal: Adequacy of tissue perfusion will improve Outcome: Progressing   Problem: Activity: Goal: Ability to tolerate increased activity will improve Outcome: Progressing   Problem: Clinical Measurements: Goal: Ability to maintain a body temperature in the normal range will improve Outcome: Progressing   Problem: Respiratory: Goal: Ability to maintain adequate ventilation will improve Outcome: Progressing Goal: Ability to maintain a clear airway will improve Outcome: Progressing   Problem: Education: Goal: Knowledge of General Education information will improve Description: Including pain rating scale, medication(s)/side effects and non-pharmacologic comfort measures Outcome: Progressing   Problem: Health Behavior/Discharge Planning: Goal: Ability to manage  health-related needs will improve Outcome: Progressing   Problem: Clinical Measurements: Goal: Ability to maintain clinical measurements within normal limits will improve Outcome: Progressing Goal: Will remain free from infection Outcome: Progressing Goal: Diagnostic test results will improve Outcome: Progressing Goal: Respiratory complications will improve Outcome: Progressing Goal: Cardiovascular complication will be avoided Outcome: Progressing   Problem: Activity: Goal: Risk for activity intolerance will decrease Outcome: Progressing   Problem: Nutrition: Goal: Adequate nutrition will be maintained Outcome: Progressing   Problem: Coping: Goal: Level of anxiety will decrease Outcome: Progressing   Problem: Elimination: Goal: Will not experience complications related to bowel motility Outcome: Progressing Goal: Will not experience complications related to urinary retention Outcome: Progressing   Problem: Pain Managment: Goal: General experience of comfort will improve and/or be controlled Outcome: Progressing   Problem: Safety: Goal: Ability to remain free from injury will improve Outcome: Progressing   Problem: Skin Integrity: Goal: Risk for impaired skin integrity will decrease Outcome: Progressing

## 2024-05-01 NOTE — Progress Notes (Signed)
 PROGRESS NOTE    Jose Patrick  ZOX:[id number] DOB: [birth year] DOA: 04/19/2024 PCP: [name]   Brief Narrative: Jose Patrick is a 73 y.o. male with a history of BPH, bipolar disorder, constipation, diabetes mellitus type 2, hyperlipidemia, PTSD, lumbar spinal stenosis.  Patient presented secondary to shortness of breath with evidence of recurrent pneumonia, rhinovirus positive, in addition to acute on chronic hypoxia. Pulmonology consulted. Bronchoscopy performed on 6/5.  Positive cytology with non-small cell carcinoma from RUL, RML and RLL .  Will follow-up outpatient with oncology.  Plan is home in the a.m. if oxygen levels improved   Assessment and Plan:  Acute on chronic respiratory failure with hypoxia Baseline 2 L/min of oxygen. Requiring up to 4 L/min this admission. CT evidence of right lower lung airspce disease in addition to developing diffuse nodular infiltration throught the left lung. Possible sequela from recurrent pneumonia vs radiation pneumonitis vs organizing pneumonia. Also concern for persistent cancer with lymphangitic spread. Pulmonology is consulted. Patient started empirically on steroids, Cefepime  and doxycycline . Bronchoscopy performed on 6/5, which does not suggest infection; pathology pending. Weaned to 3 L/min but had an episode of respiratory distress on 6/8 with need to increase up to 6 L/min of oxygen. BAL culture no growth to date. Cytology consistent with non-small cell lung cancer. -Continue prednisone  with taper -Pulmonology has signed off -Continue PPI -Wean to baseline 2 L/min as able-suspect patient will go home on a higher amount of oxygen for short-term - Oncology Dr. Colie Dawes and got an MRI without contrast and plans to get a PET outpatient  Rhinovirus pneumonia Noted. CT imaging suggesting pneumonia. -Continue supportive care  Hemoptysis In setting of recent bronchscopy. Aspirin  and Lovenox  still held. Discussed with  pulmonology and patient received  tranexamic acid  via nebulization. Patient with continued hemoptysis although there is some improvement. Pulmonology signed off, status post TXA neb  Bilateral pleural effusions Noted on CT imaging. Right greater than left.  Lung cancer, recurrent History of remote left upper lobe lobectomy and concern for right lower lobe lung cancer s/p recent radiation. Concern for lymphangitic spread.  Bilateral toe pain Symptoms seem consistent with possible peripheral neuropathy. Confined to the tips. -Capsaicin  cream  Diabetes mellitus type 2 with neuropathy Diabetes is well controlled for age with hemoglobin A1C of 7.8%. Patient is on solumedrol for management of respiratory failure. -Continue Semglee  and SSI  Primary hypertension -Continue Toprol  XL  CAD Hyperlipidemia -Continue Lipitor, Zetia , aspirin  (held for bronchoscopy) and Ranexa   GERD -Continue Protonix   OSA -Continue CPAP at bedtime  Chronic constipation -Continue MiraLAX   BPH Patient with lower urinary tract symptoms. Patient started on finasteride . -Continue finasteride   PTSD Bipolar disorder Noted. Not currently on medication therapy.  Lumbar stenosis Noted.  Morbid obesity, class II Estimated body mass index is 30 kg/m as calculated from the following:   Height as of this encounter: 5' 8 (1.727 m).   Weight as of this encounter: 89.5 kg.   DVT prophylaxis: Lovenox  (held for bronchoscopy and hemoptysis). SCDs. Code Status:   Code Status: Full Code Family Communication: None at bedside Disposition Plan: Suspect patient can be discharged home in the a.m.   Consultants:  Pulmonology Oncology  Procedures:  Bronchoscopy     Subjective: Says he feels the best today that he has felt the entire time  Objective: BP (!) 156/75 (BP Location: Left Arm)   Pulse 66   Temp 98 F (36.7 C) (Axillary)   Resp 20   Ht 5'  8 (1.727 m)   Wt 89.5 kg   SpO2 90%   BMI 30.00  kg/m   Examination:   General: Appearance:    Obese male in no acute distress     Lungs:   Moving more air  Heart:    Normal heart rate.   MS:   All extremities are intact.   Neurologic:   Awake, alert      Data Reviewed: I have personally reviewed following labs and imaging studies  CBC Lab Results  Component Value Date   WBC 12.0 (H) 05/01/2024   RBC 5.92 (H) 05/01/2024   HGB 15.4 05/01/2024   HCT 49.5 05/01/2024   MCV 83.6 05/01/2024   MCH 26.0 05/01/2024   PLT 275 05/01/2024   MCHC 31.1 05/01/2024   RDW 16.0 (H) 05/01/2024   LYMPHSABS 1.4 04/21/2024   MONOABS 0.8 04/21/2024   EOSABS 0.1 04/21/2024   BASOSABS 0.0 04/21/2024     Last metabolic panel Lab Results  Component Value Date   NA 137 05/01/2024   K 5.0 05/01/2024   CL 95 (L) 05/01/2024   CO2 32 05/01/2024   BUN 17 05/01/2024   CREATININE 0.85 05/01/2024   GLUCOSE 135 (H) 05/01/2024   GFRNONAA >60 05/01/2024   GFRAA  02/23/2009    >60        The eGFR has been calculated using the MDRD equation. This calculation has not been validated in all clinical situations. eGFR's persistently <60 mL/min signify possible Chronic Kidney Disease.   CALCIUM  9.4 05/01/2024   PROT 5.7 (L) 04/20/2024   ALBUMIN 2.6 (L) 04/20/2024   BILITOT 0.6 04/20/2024   ALKPHOS 47 04/20/2024   AST 18 04/20/2024   ALT 27 04/20/2024   ANIONGAP 10 05/01/2024    GFR: Estimated Creatinine Clearance: 85.3 mL/min (by C-G formula based on SCr of 0.85 mg/dL).  Recent Results (from the past 240 hours)  Fungus Culture With Stain     Status: None (Preliminary result)   Collection Time: 04/22/24 12:16 PM   Specimen: Bronchial Alveolar Lavage; Respiratory  Result Value Ref Range Status   Fungus Stain Final report  Final    Comment: (NOTE) Performed At: [facility] [address] [place], [place] [id number] Pearlean Botts MD WU:[id number]    Fungus (Mycology) Culture PENDING  Incomplete   Fungal Source BRONCHIAL  ALVEOLAR LAVAGE  Final    Comment: Performed at [facility], [address], [place], [place] [address]  Culture, BAL-quantitative w Gram Stain     Status: None   Collection Time: 04/22/24 12:16 PM   Specimen: Bronchial Alveolar Lavage; Respiratory  Result Value Ref Range Status   Specimen Description   Final    BRONCHIAL ALVEOLAR LAVAGE Performed at [facility], [address], [place], [place] [address]    Special Requests   Final    NONE Performed at [facility], [address], [place], [place] [address]    Gram Stain NO WBC SEEN NO ORGANISMS SEEN   Final   Culture   Final    NO GROWTH 2 DAYS Performed at [facility], [address], [place], [place] [address]    Report Status 04/25/2024 FINAL  Final  Aerobic/Anaerobic Culture w Gram Stain (surgical/deep wound)     Status: None   Collection Time: 04/22/24 12:16 PM   Specimen: Bronchial Alveolar Lavage; Respiratory  Result Value Ref Range Status   Specimen Description   Final  BRONCHIAL ALVEOLAR LAVAGE Performed at [facility], [address] [place], [place] [address]    Special Requests   Final    NONE Performed at [facility], [address], [place], [place] [address]    Gram Stain NO WBC SEEN NO ORGANISMS SEEN   Final   Culture   Final    No growth aerobically or anaerobically. Performed at [facility], [address] [place], [place] [address]    Report Status 04/27/2024 FINAL  Final  Acid Fast Smear (AFB)     Status: None   Collection Time: 04/22/24 12:16 PM   Specimen: Bronchial Alveolar Lavage; Respiratory  Result Value Ref Range Status   AFB Specimen Processing Concentration  Final   Acid Fast Smear Negative  Final    Comment: (NOTE) Performed At: [facility] [address] [place], [place] [id number] Pearlean Botts MD XL:[id number]    Source (AFB) BRONCHIAL ALVEOLAR LAVAGE   Final    Comment: Performed at [facility], [address], [place], [place] [address]  Fungus Culture Result     Status: None   Collection Time: 04/22/24 12:16 PM  Result Value Ref Range Status   Result 1 Comment  Final    Comment: (NOTE) KOH/Calcofluor preparation:  no fungus observed. Performed At: [facility] [address] [place], [place] [id number] Pearlean Botts MD GL:[id number]       Radiology Studies: No results found.       LOS: 12 days    Katrina Parma DO [facility] 05/01/2024, 11:36 AM   If 7PM-7AM, please contact night-coverage [email or web address]

## 2024-05-01 NOTE — Plan of Care (Signed)
  Problem: Nutritional: Goal: Maintenance of adequate nutrition will improve Outcome: Progressing   Problem: Clinical Measurements: Goal: Respiratory complications will improve Outcome: Progressing   Problem: Safety: Goal: Ability to remain free from injury will improve Outcome: Progressing

## 2024-05-02 DIAGNOSIS — J9621 Acute and chronic respiratory failure with hypoxia: Secondary | ICD-10-CM | POA: Diagnosis not present

## 2024-05-02 LAB — GLUCOSE, CAPILLARY
Glucose-Capillary: 85 mg/dL (ref 70–99)
Glucose-Capillary: 174 mg/dL — ABNORMAL HIGH (ref 70–99)

## 2024-05-02 MED ORDER — FINASTERIDE 5 MG PO TABS: 5.0000 mg | ORAL_TABLET | Freq: Every day | ORAL | 0 refills | Status: DC

## 2024-05-02 MED ORDER — PREDNISONE 10 MG PO TABS: ORAL_TABLET | ORAL | 0 refills | Status: DC

## 2024-05-02 NOTE — Discharge Summary (Signed)
 Physician Discharge Summary  Khoa Opdahl ZOX:[id number] DOB: 1951/09/09 DOA: 04/19/2024  PCP: [name]  Admit date: 04/19/2024 Discharge date: 05/02/2024  Admitted From: Home Discharge disposition: Home   Recommendations for Outpatient Follow-Up:   Post oncology follow-up for outpatient PET scan and treatment plan Home health Increase oxygen to 4 L for now and wean as able   Discharge Diagnosis:   Principal Problem:   Bilateral pneumonia Active Problems:   Hyperlipidemia   Essential hypertension   GERD (gastroesophageal reflux disease)   OSA (obstructive sleep apnea)   Type 2 diabetes mellitus with diabetic neuropathy, unspecified (HCC)   COPD with acute exacerbation (HCC)   Acute on chronic respiratory failure with hypoxia (HCC)   Class 2 obesity    Discharge Condition: Improved.  Diet recommendation: Low sodium, heart healthy.  Carbohydrate-modified  Wound care: None.  Code status: Full.   History of Present Illness:   Jose Patrick is a 73 y.o. male with medical history significant of allergic rhinitis, BPH, bipolar affective disorder, colon polyps, chronic constipation, type 2 diabetes, erectile dysfunction, hyperlipidemia, PTSD, lumbar spinal stenosis who was recently hospitalized for pneumonia about a month ago, but stated that he did not have significant improvement after being discharged home.  He was seen by his pulmonologist who gave him a course of levofloxacin and prednisone .  Today, he presented to the emergency department due to progressively worse dyspnea associated productive cough for the past few days.  He stated that his O2 saturation at home was in the 70s earlier today.  In triage, the patient has labored breathing.  He has subjective fever and chills.  No night sweats.He denied rhinorrhea, sore throat or hemoptysis.  No chest pain, palpitations, diaphoresis, PND, orthopnea or pitting edema of the lower extremities.  No  abdominal pain, nausea, emesis, diarrhea, constipation, melena or hematochezia.  No flank pain, dysuria, frequency or hematuria.  No polyuria, polydipsia, polyphagia or blurred vision.    Hospital Course by Problem:   Acute on chronic respiratory failure with hypoxia Baseline 2 L/min of oxygen. Requiring up to 4 L/min this admission. CT evidence of right lower lung airspce disease in addition to developing diffuse nodular infiltration throught the left lung. Possible sequela from recurrent pneumonia vs radiation pneumonitis vs organizing pneumonia. Also concern for persistent cancer with lymphangitic spread. Pulmonology is consulted. Patient started empirically on steroids, Cefepime  and doxycycline . Bronchoscopy performed on 6/5, which does not suggest infection; pathology pending. Weaned to 3 L/min but had an episode of respiratory distress on 6/8 with need to increase up to 6 L/min of oxygen. BAL culture no growth to date. Cytology consistent with non-small cell lung cancer. -Continue prednisone  with taper -Pulmonology has signed off -Continue PPI -Wean to baseline 2 L/min as able-discharge currently on 4 L to wean outpatient - Oncology Dr. Colie Dawes and got an MRI without contrast and plans to get a PET outpatient   Rhinovirus pneumonia Noted. CT imaging suggesting pneumonia. -Continue supportive care   Hemoptysis In setting of recent bronchscopy. Aspirin  and Lovenox  still held. Discussed with pulmonology and patient received  tranexamic acid  via nebulization. Patient with continued hemoptysis although there is some improvement. Pulmonology signed off, status post TXA neb-minimal bleeding   Bilateral pleural effusions Noted on CT imaging. Right greater than left. -Diuresed well with IV Lasix    Lung cancer, recurrent History of remote left upper lobe lobectomy and concern for right lower lobe lung cancer s/p recent radiation. Concern for  lymphangitic spread. Needs close outpatient  follow-up with oncology   Bilateral toe pain Symptoms seem consistent with possible peripheral neuropathy. Confined to the tips.    Diabetes mellitus type 2 with neuropathy Diabetes is well controlled for age with hemoglobin A1C of 7.8%. - Resume home meds   Primary hypertension -Continue Toprol  XL   CAD Hyperlipidemia -Continue Lipitor, Zetia , aspirin  (held for bronchoscopy) and Ranexa    GERD -Continue Protonix    OSA -Continue CPAP at bedtime   Chronic constipation -Continue MiraLAX    BPH Patient with lower urinary tract symptoms. Patient started on finasteride . -Continue finasteride    PTSD Bipolar disorder Noted. Not currently on medication therapy.   Lumbar stenosis Noted.   Morbid obesity, class II Estimated body mass index is 30 kg/m as calculated from the following:   Height as of this encounter: 5' 8 (1.727 m).   Weight as of this encounter: 89.5 kg.      Medical Consultants:   Pulmonary   Discharge Exam:   Vitals:   05/02/24 0913 05/02/24 0930  BP: (!) 116/51   Pulse: 76   Resp:    Temp: 98.6 F (37 C)   SpO2: 96% 92%   Vitals:   05/02/24 0512 05/02/24 0649 05/02/24 0913 05/02/24 0930  BP: (!) 119/99  (!) 116/51   Pulse: (!) 58  76   Resp: 19     Temp: (!) 97.3 F (36.3 C) 97.8 F (36.6 C) 98.6 F (37 C)   TempSrc: Oral Oral Oral   SpO2: 94%  96% 92%  Weight:      Height:        General exam: Appears calm and comfortable.  Much improved on day of discharge and excited to go home   The results of significant diagnostics from this hospitalization (including imaging, microbiology, ancillary and laboratory) are listed below for reference.     Procedures and Diagnostic Studies:   DG CHEST PORT 1 VIEW Result Date: 04/20/2024 CLINICAL DATA:  Status post thoracentesis. EXAM: PORTABLE CHEST 1 VIEW COMPARISON:  Radiograph in CT yesterday FINDINGS: No pneumothorax post thoracentesis. Slight decreased right basilar opacity, persistent  airspace disease and likely pleural fluid persists. Underlying emphysema and bronchial thickening. Pulmonary nodules on CT are faintly visualized. Stable heart size and mediastinal contours. IMPRESSION: 1. No pneumothorax post thoracentesis. 2. Slight decreased right basilar opacity. Persistent right sided airspace disease and likely pleural fluid. Electronically Signed   By: Chadwick Colonel M.D.   On: 04/20/2024 15:10   CT CHEST WO CONTRAST Result Date: 04/19/2024 CLINICAL DATA:  Multifocal pneumonia. Worsening cough and shortness of breath over the past few days. Decreased oxygen saturation. EXAM: CT CHEST WITHOUT CONTRAST TECHNIQUE: Multidetector CT imaging of the chest was performed following the standard protocol without IV contrast. RADIATION DOSE REDUCTION: This exam was performed according to the departmental dose-optimization program which includes automated exposure control, adjustment of the mA and/or kV according to patient size and/or use of iterative reconstruction technique. COMPARISON:  Chest radiograph 04/19/2024.  CT chest 04/01/2024 FINDINGS: Cardiovascular: Normal heart size. Minimal pericardial effusion. Normal caliber thoracic aorta. Calcification of the aorta and coronary arteries. Mediastinum/Nodes: Esophagus is decompressed. Thyroid gland is unremarkable. Mediastinal lymphadenopathy with largest pretracheal nodes measuring up to about 1.4 cm diameter. No change since prior study. This is nonspecific but likely to be reactive. Lungs/Pleura: Bilateral pleural effusions, greater on the right. Dense consolidation in the right lower lung with patchy airspace disease throughout the remainder the right lung. Numerous small nodules demonstrated  throughout the left lung in the area of portion of the right lung which are new since prior study. Short interval time frame suggest that these are likely infectious or inflammatory. Changes most likely represent progressing pneumonia. This could also  represent atypical pneumonia such as TB or fungal infection. Septic emboli would also be a possibility. Follow-up to resolution is recommended. Underlying emphysematous changes in the lungs. Upper Abdomen: No acute abnormalities. Musculoskeletal: Degenerative changes in the spine. Postoperative change in the cervical spine. No acute bony abnormalities. IMPRESSION: 1. Bilateral pleural effusions, greater on the right. 2. Dense consolidation in the right lower lung with airspace disease throughout the remaining right lung. This is progressing since the prior study. 3. Developing diffuse fine nodular infiltration throughout the left lung in the aerated portion of the right lung. This is likely infectious or inflammatory. 4. Underlying emphysematous changes in the lungs. Electronically Signed   By: Boyce Byes M.D.   On: 04/19/2024 18:25   DG Chest 2 View Result Date: 04/19/2024 CLINICAL DATA:  Cough, shortness of breath EXAM: CHEST - 2 VIEW COMPARISON:  04/01/2024 FINDINGS: Diffuse bilateral airspace disease again noted, right greater than left, similar to prior study. Possible small bilateral effusions. Heart and mediastinal contours within normal limits. Aortic atherosclerosis. IMPRESSION: Continued diffuse bilateral airspace disease, right greater than left concerning for pneumonia. Findings similar to prior study. Electronically Signed   By: Janeece Mechanic M.D.   On: 04/19/2024 12:48     Labs:   Basic Metabolic Panel: Recent Labs  Lab 04/29/24 0549 05/01/24 0656  NA 137 137  K 4.3 5.0  CL 98 95*  CO2 31 32  GLUCOSE 115* 135*  BUN 18 17  CREATININE 0.86 0.85  CALCIUM  9.4 9.4   GFR Estimated Creatinine Clearance: 85.3 mL/min (by C-G formula based on SCr of 0.85 mg/dL). Liver Function Tests: No results for input(s): AST, ALT, ALKPHOS, BILITOT, PROT, ALBUMIN in the last 168 hours. No results for input(s): LIPASE, AMYLASE in the last 168 hours. No results for input(s):  AMMONIA in the last 168 hours. Coagulation profile No results for input(s): INR, PROTIME in the last 168 hours.  CBC: Recent Labs  Lab 04/25/24 1314 04/29/24 0549 05/01/24 0656  WBC 10.6* 9.2 12.0*  HGB 15.4 15.1 15.4  HCT 49.2 48.2 49.5  MCV 84.8 83.7 83.6  PLT 362 282 275   Cardiac Enzymes: No results for input(s): CKTOTAL, CKMB, CKMBINDEX, TROPONINI in the last 168 hours. BNP: Invalid input(s): POCBNP CBG: Recent Labs  Lab 05/01/24 0758 05/01/24 1221 05/01/24 1645 05/01/24 2149 05/02/24 0737  GLUCAP 128* 218* 202* 262* 85   D-Dimer No results for input(s): DDIMER in the last 72 hours. Hgb A1c No results for input(s): HGBA1C in the last 72 hours. Lipid Profile No results for input(s): CHOL, HDL, LDLCALC, TRIG, CHOLHDL, LDLDIRECT in the last 72 hours. Thyroid function studies No results for input(s): TSH, T4TOTAL, T3FREE, THYROIDAB in the last 72 hours.  Invalid input(s): FREET3 Anemia work up No results for input(s): VITAMINB12, FOLATE, FERRITIN, TIBC, IRON, RETICCTPCT in the last 72 hours. Microbiology Recent Results (from the past 240 hours)  Fungus Culture With Stain     Status: None (Preliminary result)   Collection Time: 04/22/24 12:16 PM   Specimen: Bronchial Alveolar Lavage; Respiratory  Result Value Ref Range Status   Fungus Stain Final report  Final    Comment: (NOTE) Performed At: [facility] [address] [place], [place] [id number] Pearlean Botts MD WU:[id number]  Fungus (Mycology) Culture PENDING  Incomplete   Fungal Source BRONCHIAL ALVEOLAR LAVAGE  Final    Comment: Performed at [facility], [address], [place], [place] [address]  Culture, BAL-quantitative w Gram Stain     Status: None   Collection Time: 04/22/24 12:16 PM   Specimen: Bronchial Alveolar Lavage; Respiratory  Result Value Ref Range Status   Specimen Description   Final    BRONCHIAL  ALVEOLAR LAVAGE Performed at [facility], [address] [place], [place] [address]    Special Requests   Final    NONE Performed at [facility], [address] [place], [place] [address]    Gram Stain NO WBC SEEN NO ORGANISMS SEEN   Final   Culture   Final    NO GROWTH 2 DAYS Performed at [facility], [address], [place], [place] [address]    Report Status 04/25/2024 FINAL  Final  Aerobic/Anaerobic Culture w Gram Stain (surgical/deep wound)     Status: None   Collection Time: 04/22/24 12:16 PM   Specimen: Bronchial Alveolar Lavage; Respiratory  Result Value Ref Range Status   Specimen Description   Final    BRONCHIAL ALVEOLAR LAVAGE Performed at [facility], [address], [place], [place] [address]    Special Requests   Final    NONE Performed at [facility], [address] [place], [place] [address]    Gram Stain NO WBC SEEN NO ORGANISMS SEEN   Final   Culture   Final    No growth aerobically or anaerobically. Performed at [facility], [address], [place], [place] [address]    Report Status 04/27/2024 FINAL  Final  Acid Fast Smear (AFB)     Status: None   Collection Time: 04/22/24 12:16 PM   Specimen: Bronchial Alveolar Lavage; Respiratory  Result Value Ref Range Status   AFB Specimen Processing Concentration  Final   Acid Fast Smear Negative  Final    Comment: (NOTE) Performed At: [facility] [address] [place], [place] [id number] Pearlean Botts MD LK:[id number]    Source (AFB) BRONCHIAL ALVEOLAR LAVAGE  Final    Comment: Performed at [facility], [address], [place], [place] [address]  Fungus Culture Result     Status: None   Collection Time: 04/22/24 12:16 PM  Result Value Ref Range Status   Result 1 Comment  Final    Comment: (NOTE) KOH/Calcofluor preparation:  no fungus observed. Performed At: [facility] [address] [place], [place] [id number] Pearlean Botts MD LO:[id number]      Discharge Instructions:   Discharge Instructions     Diet Carb Modified   Complete by: As directed    Discharge instructions   Complete by: As directed    O2 at 4L-- wean back to 2L as able Oncology follow up-- needs PET scan   Increase activity slowly   Complete by: As directed       Allergies as of 05/02/2024       Reactions   Chlorzoxazone Swelling, Other (See Comments)   Psychosis  (Parafon Forte) ABSOLUTELY CANNOT HAVE THIS!!   Tramadol Other (See Comments)   Muscles Tightness         Medication List     PAUSE taking these medications    aspirin  81 MG chewable tablet Wait to take this until your doctor or other care provider tells you to start  again. Chew 1 tablet (81 mg total) by mouth daily.       TAKE these medications    albuterol  108 (90 Base) MCG/ACT inhaler Commonly known as: VENTOLIN  HFA Inhale 2 puffs into the lungs every 6 (six) hours as needed for wheezing or shortness of breath.   atorvastatin  40 MG tablet Commonly known as: LIPITOR Take 1 tablet (40 mg total) by mouth daily. What changed: when to take this   benzonatate  200 MG capsule Commonly known as: TESSALON  Take 1 capsule (200 mg total) by mouth 3 (three) times daily as needed for cough.   cycloSPORINE 0.05 % ophthalmic emulsion Commonly known as: RESTASIS Place 1 drop into both eyes 2 (two) times daily.   dextromethorphan -guaiFENesin  10-100 MG/5ML liquid Commonly known as: Tussin DM Take 5 mLs by mouth every 4 (four) hours as needed for cough.   ezetimibe  10 MG tablet Commonly known as: ZETIA  Take 10 mg by mouth daily.   famotidine  20 MG tablet Commonly known as: Pepcid  Take 1 tablet (20 mg total) by mouth at bedtime.   feeding supplement Liqd Take 237 mLs by mouth 2 (two) times daily between meals.   ferrous sulfate  325 (65 FE) MG EC tablet Take 325 mg by mouth in the morning  and at bedtime.   finasteride  5 MG tablet Commonly known as: PROSCAR  Take 1 tablet (5 mg total) by mouth daily. Start taking on: May 03, 2024   fluticasone  50 MCG/ACT nasal spray Commonly known as: FLONASE  Place 2 sprays into both nostrils in the morning.   fluticasone -salmeterol 250-50 MCG/ACT Aepb Commonly known as: Wixela Inhub ONE puff in the in morning and ONE puff in the evening What changed:  how much to take how to take this when to take this additional instructions   furosemide  20 MG tablet Commonly known as: LASIX  Take 20 mg by mouth as needed for edema.   gabapentin  300 MG capsule Commonly known as: NEURONTIN  Take 300 mg by mouth 2 (two) times daily. Take 300 mg by mouth in the morning & at bedtime- and an additional 300 mg once a day as needed for unresolved neuropathy   glucose 4 GM chewable tablet Chew 1 tablet by mouth as needed for low blood sugar.   guaiFENesin -codeine  100-10 MG/5ML syrup Take 5 mLs by mouth 2 (two) times daily as needed for cough.   insulin  glargine 100 UNIT/ML injection Commonly known as: LANTUS  Inject 0.25 mLs (25 Units total) into the skin daily. What changed:  how much to take when to take this   ipratropium-albuterol  0.5-2.5 (3) MG/3ML Soln Commonly known as: DUONEB Take 3 mLs by nebulization 3 (three) times daily for 5 days, THEN 3 mLs every 6 (six) hours as needed. Start taking on: Apr 07, 2024   Jardiance  25 MG Tabs tablet Generic drug: empagliflozin  Take 25 mg by mouth at bedtime.   losartan  25 MG tablet Commonly known as: COZAAR  Take 12.5 mg by mouth daily.   Magnesium  Oxide 420 MG Tabs Take 420 mg by mouth daily with breakfast.   Metamucil Smooth Texture 58.6 % powder Generic drug: psyllium Take 1 packet by mouth daily.   metFORMIN  1000 MG tablet Commonly known as: GLUCOPHAGE  Take 1,000 mg by mouth 2 (two) times daily.   metoprolol  succinate 100 MG 24 hr tablet Commonly known as: TOPROL -XL Take 100 mg by  mouth daily. Take with or immediately following a meal.   omeprazole  40 MG capsule Commonly known as: PRILOSEC Take 1 capsule (40  mg total) by mouth daily. What changed: when to take this   OXYGEN Inhale 2 L/min into the lungs continuous.   predniSONE  10 MG tablet Commonly known as: DELTASONE  30 mg x 2 days then 20mg  x 4 days then 10mg  x 4 days then 5 mg x 4 days then stop Start taking on: May 03, 2024 What changed:  how much to take how to take this when to take this additional instructions   pyridoxine  100 MG tablet Commonly known as: B-6 Take 100 mg by mouth daily.   ranolazine  500 MG 12 hr tablet Commonly known as: RANEXA  Take 1 tablet (500 mg total) by mouth 2 (two) times daily.   Semaglutide  (2 MG/DOSE) 8 MG/3ML Sopn Inject 2 mg into the skin every Sunday.   Spiriva  Respimat 2.5 MCG/ACT Aers Generic drug: Tiotropium Bromide  Monohydrate Inhale 2 puffs into the lungs daily.   Systane Balance 0.6 % Soln Generic drug: Propylene Glycol Place 1 drop into both eyes in the morning.   Testosterone 1.62 % Gel Place 2 Pump onto the skin in the morning.   Vitamin D3 1000 units Caps Take 1,000 Units by mouth daily.        Follow-up Information     [facility] [place], [place] Follow up.   Why: This provider will follow up with you to begin home health PT/OT services upon discharge from the hospital. Contact information: [address] [place] [address] [phone]         [facility] Follow up in 1 week(s).   Contact information: [address] [place] [address] [phone]         Marlene Simas, MD Follow up.   Specialty: Oncology Why: or your previous oncologist-- need PET scan Contact information: [address] [place] [address] [phone]                  Time coordinating discharge: 45 min  Signed:  Enrigue Harvard DO  [facility] 05/02/2024, 10:15 AM

## 2024-05-02 NOTE — Plan of Care (Signed)
   Problem: Education: Goal: Ability to describe self-care measures that may prevent or decrease complications (Diabetes Survival Skills Education) will improve Outcome: Progressing Goal: Individualized Educational Video(s) Outcome: Progressing   Problem: Coping: Goal: Ability to adjust to condition or change in health will improve Outcome: Progressing   Problem: Fluid Volume: Goal: Ability to maintain a balanced intake and output will improve Outcome: Progressing   Problem: Health Behavior/Discharge Planning: Goal: Ability to identify and utilize available resources and services will improve Outcome: Progressing Goal: Ability to manage health-related needs will improve Outcome: Progressing   Problem: Metabolic: Goal: Ability to maintain appropriate glucose levels will improve Outcome: Progressing   Problem: Nutritional: Goal: Maintenance of adequate nutrition will improve Outcome: Progressing Goal: Progress toward achieving an optimal weight will improve Outcome: Progressing   Problem: Skin Integrity: Goal: Risk for impaired skin integrity will decrease Outcome: Progressing   Problem: Tissue Perfusion: Goal: Adequacy of tissue perfusion will improve Outcome: Progressing   Problem: Activity: Goal: Ability to tolerate increased activity will improve Outcome: Progressing   Problem: Clinical Measurements: Goal: Ability to maintain a body temperature in the normal range will improve Outcome: Progressing   Problem: Respiratory: Goal: Ability to maintain adequate ventilation will improve Outcome: Progressing Goal: Ability to maintain a clear airway will improve Outcome: Progressing   Problem: Education: Goal: Knowledge of General Education information will improve Description: Including pain rating scale, medication(s)/side effects and non-pharmacologic comfort measures Outcome: Progressing   Problem: Health Behavior/Discharge Planning: Goal: Ability to manage  health-related needs will improve Outcome: Progressing   Problem: Clinical Measurements: Goal: Ability to maintain clinical measurements within normal limits will improve Outcome: Progressing Goal: Will remain free from infection Outcome: Progressing Goal: Diagnostic test results will improve Outcome: Progressing Goal: Respiratory complications will improve Outcome: Progressing Goal: Cardiovascular complication will be avoided Outcome: Progressing   Problem: Activity: Goal: Risk for activity intolerance will decrease Outcome: Progressing   Problem: Nutrition: Goal: Adequate nutrition will be maintained Outcome: Progressing   Problem: Coping: Goal: Level of anxiety will decrease Outcome: Progressing   Problem: Elimination: Goal: Will not experience complications related to bowel motility Outcome: Progressing Goal: Will not experience complications related to urinary retention Outcome: Progressing   Problem: Pain Managment: Goal: General experience of comfort will improve and/or be controlled Outcome: Progressing   Problem: Safety: Goal: Ability to remain free from injury will improve Outcome: Progressing   Problem: Skin Integrity: Goal: Risk for impaired skin integrity will decrease Outcome: Progressing

## 2024-05-02 NOTE — Discharge Instructions (Addendum)
-   Cough suppression - BiPAP nightly mandatory -wean O2 as able

## 2024-05-03 ENCOUNTER — Inpatient Hospital Stay (HOSPITAL_COMMUNITY)
Admission: EM | Admit: 2024-05-03 | Discharge: 2024-05-08 | DRG: 180 | Disposition: A | Discharge status: Home Health | Attending: Internal Medicine | Admitting: Internal Medicine

## 2024-05-03 ENCOUNTER — Telehealth: Payer: Self-pay | Admitting: Internal Medicine

## 2024-05-03 ENCOUNTER — Encounter (HOSPITAL_COMMUNITY): Payer: Self-pay

## 2024-05-03 ENCOUNTER — Telehealth: Payer: Self-pay | Admitting: Medical Oncology

## 2024-05-03 ENCOUNTER — Emergency Department (HOSPITAL_COMMUNITY)

## 2024-05-03 ENCOUNTER — Other Ambulatory Visit: Payer: Self-pay

## 2024-05-03 DIAGNOSIS — K219 Gastro-esophageal reflux disease without esophagitis: Secondary | ICD-10-CM | POA: Diagnosis present

## 2024-05-03 DIAGNOSIS — E1122 Type 2 diabetes mellitus with diabetic chronic kidney disease: Secondary | ICD-10-CM | POA: Diagnosis present

## 2024-05-03 DIAGNOSIS — J441 Chronic obstructive pulmonary disease with (acute) exacerbation: Secondary | ICD-10-CM | POA: Diagnosis present

## 2024-05-03 DIAGNOSIS — R4589 Other symptoms and signs involving emotional state: Secondary | ICD-10-CM

## 2024-05-03 DIAGNOSIS — Z66 Do not resuscitate: Secondary | ICD-10-CM | POA: Diagnosis present

## 2024-05-03 DIAGNOSIS — T380X5A Adverse effect of glucocorticoids and synthetic analogues, initial encounter: Secondary | ICD-10-CM | POA: Diagnosis present

## 2024-05-03 DIAGNOSIS — C3411 Malignant neoplasm of upper lobe, right bronchus or lung: Principal | ICD-10-CM | POA: Diagnosis present

## 2024-05-03 DIAGNOSIS — Z7982 Long term (current) use of aspirin: Secondary | ICD-10-CM

## 2024-05-03 DIAGNOSIS — Z9981 Dependence on supplemental oxygen: Secondary | ICD-10-CM

## 2024-05-03 DIAGNOSIS — Z515 Encounter for palliative care: Principal | ICD-10-CM

## 2024-05-03 DIAGNOSIS — F259 Schizoaffective disorder, unspecified: Secondary | ICD-10-CM | POA: Diagnosis present

## 2024-05-03 DIAGNOSIS — Z7189 Other specified counseling: Secondary | ICD-10-CM

## 2024-05-03 DIAGNOSIS — J439 Emphysema, unspecified: Secondary | ICD-10-CM | POA: Diagnosis present

## 2024-05-03 DIAGNOSIS — F319 Bipolar disorder, unspecified: Secondary | ICD-10-CM | POA: Diagnosis present

## 2024-05-03 DIAGNOSIS — Z902 Acquired absence of lung [part of]: Secondary | ICD-10-CM

## 2024-05-03 DIAGNOSIS — E66812 Obesity, class 2: Secondary | ICD-10-CM | POA: Diagnosis present

## 2024-05-03 DIAGNOSIS — Z91199 Patient's noncompliance with other medical treatment and regimen due to unspecified reason: Secondary | ICD-10-CM

## 2024-05-03 DIAGNOSIS — C799 Secondary malignant neoplasm of unspecified site: Secondary | ICD-10-CM | POA: Diagnosis present

## 2024-05-03 DIAGNOSIS — Z818 Family history of other mental and behavioral disorders: Secondary | ICD-10-CM

## 2024-05-03 DIAGNOSIS — C3431 Malignant neoplasm of lower lobe, right bronchus or lung: Secondary | ICD-10-CM | POA: Diagnosis present

## 2024-05-03 DIAGNOSIS — F4310 Post-traumatic stress disorder, unspecified: Secondary | ICD-10-CM | POA: Diagnosis present

## 2024-05-03 DIAGNOSIS — I130 Hypertensive heart and chronic kidney disease with heart failure and stage 1 through stage 4 chronic kidney disease, or unspecified chronic kidney disease: Secondary | ICD-10-CM | POA: Diagnosis present

## 2024-05-03 DIAGNOSIS — C3490 Malignant neoplasm of unspecified part of unspecified bronchus or lung: Secondary | ICD-10-CM | POA: Diagnosis not present

## 2024-05-03 DIAGNOSIS — Z860100 Personal history of colon polyps, unspecified: Secondary | ICD-10-CM

## 2024-05-03 DIAGNOSIS — Z794 Long term (current) use of insulin: Secondary | ICD-10-CM

## 2024-05-03 DIAGNOSIS — E785 Hyperlipidemia, unspecified: Secondary | ICD-10-CM | POA: Diagnosis present

## 2024-05-03 DIAGNOSIS — R042 Hemoptysis: Principal | ICD-10-CM | POA: Diagnosis present

## 2024-05-03 DIAGNOSIS — G4733 Obstructive sleep apnea (adult) (pediatric): Secondary | ICD-10-CM | POA: Diagnosis present

## 2024-05-03 DIAGNOSIS — Z79899 Other long term (current) drug therapy: Secondary | ICD-10-CM

## 2024-05-03 DIAGNOSIS — I5033 Acute on chronic diastolic (congestive) heart failure: Secondary | ICD-10-CM | POA: Diagnosis present

## 2024-05-03 DIAGNOSIS — I2510 Atherosclerotic heart disease of native coronary artery without angina pectoris: Secondary | ICD-10-CM | POA: Diagnosis present

## 2024-05-03 DIAGNOSIS — Z885 Allergy status to narcotic agent status: Secondary | ICD-10-CM

## 2024-05-03 DIAGNOSIS — N400 Enlarged prostate without lower urinary tract symptoms: Secondary | ICD-10-CM | POA: Diagnosis present

## 2024-05-03 DIAGNOSIS — J9621 Acute and chronic respiratory failure with hypoxia: Secondary | ICD-10-CM | POA: Diagnosis not present

## 2024-05-03 DIAGNOSIS — Z6836 Body mass index (BMI) 36.0-36.9, adult: Secondary | ICD-10-CM

## 2024-05-03 DIAGNOSIS — Z7984 Long term (current) use of oral hypoglycemic drugs: Secondary | ICD-10-CM

## 2024-05-03 DIAGNOSIS — E8770 Fluid overload, unspecified: Secondary | ICD-10-CM | POA: Insufficient documentation

## 2024-05-03 DIAGNOSIS — Z923 Personal history of irradiation: Secondary | ICD-10-CM

## 2024-05-03 DIAGNOSIS — E1165 Type 2 diabetes mellitus with hyperglycemia: Secondary | ICD-10-CM | POA: Diagnosis present

## 2024-05-03 DIAGNOSIS — Z9185 Personal history of military service: Secondary | ICD-10-CM

## 2024-05-03 DIAGNOSIS — Z888 Allergy status to other drugs, medicaments and biological substances status: Secondary | ICD-10-CM

## 2024-05-03 DIAGNOSIS — Z1152 Encounter for screening for COVID-19: Secondary | ICD-10-CM

## 2024-05-03 DIAGNOSIS — Z87891 Personal history of nicotine dependence: Secondary | ICD-10-CM

## 2024-05-03 HISTORY — DX: Malignant neoplasm of unspecified part of unspecified bronchus or lung: C34.90

## 2024-05-03 LAB — CBC WITH DIFFERENTIAL/PLATELET
Abs Immature Granulocytes: 0.05 10*3/uL (ref 0.00–0.07)
Basophils Absolute: 0 10*3/uL (ref 0.0–0.1)
Basophils Relative: 0 %
Eosinophils Absolute: 0.2 10*3/uL (ref 0.0–0.5)
Eosinophils Relative: 1 %
HCT: 46.9 % (ref 39.0–52.0)
Hemoglobin: 14.7 g/dL (ref 13.0–17.0)
Immature Granulocytes: 0 %
Lymphocytes Relative: 6 %
Lymphs Abs: 0.8 10*3/uL (ref 0.7–4.0)
MCH: 26.2 pg (ref 26.0–34.0)
MCHC: 31.3 g/dL (ref 30.0–36.0)
MCV: 83.6 fL (ref 80.0–100.0)
Monocytes Absolute: 0.5 10*3/uL (ref 0.1–1.0)
Monocytes Relative: 3 %
Neutro Abs: 12.7 10*3/uL — ABNORMAL HIGH (ref 1.7–7.7)
Neutrophils Relative %: 90 %
Platelets: 274 10*3/uL (ref 150–400)
RBC: 5.61 MIL/uL (ref 4.22–5.81)
RDW: 15.6 % — ABNORMAL HIGH (ref 11.5–15.5)
WBC: 14.3 10*3/uL — ABNORMAL HIGH (ref 4.0–10.5)
nRBC: 0 % (ref 0.0–0.2)

## 2024-05-03 LAB — I-STAT CHEM 8, ED
BUN: 26 mg/dL — ABNORMAL HIGH (ref 8–23)
Calcium, Ion: 1.22 mmol/L (ref 1.15–1.40)
Chloride: 95 mmol/L — ABNORMAL LOW (ref 98–111)
Creatinine, Ser: 1 mg/dL (ref 0.61–1.24)
Glucose, Bld: 404 mg/dL — ABNORMAL HIGH (ref 70–99)
HCT: 49 % (ref 39.0–52.0)
Hemoglobin: 16.7 g/dL (ref 13.0–17.0)
Potassium: 4.7 mmol/L (ref 3.5–5.1)
Sodium: 134 mmol/L — ABNORMAL LOW (ref 135–145)
TCO2: 28 mmol/L (ref 22–32)

## 2024-05-03 LAB — BLOOD GAS, VENOUS
Acid-Base Excess: 5.8 mmol/L — ABNORMAL HIGH (ref 0.0–2.0)
Bicarbonate: 32.5 mmol/L — ABNORMAL HIGH (ref 20.0–28.0)
O2 Saturation: 79.4 %
Patient temperature: 37
pCO2, Ven: 55 mmHg (ref 44–60)
pH, Ven: 7.38 (ref 7.25–7.43)
pO2, Ven: 48 mmHg — ABNORMAL HIGH (ref 32–45)

## 2024-05-03 LAB — COMPREHENSIVE METABOLIC PANEL WITH GFR
ALT: 58 U/L — ABNORMAL HIGH (ref 0–44)
AST: 25 U/L (ref 15–41)
Albumin: 2.5 g/dL — ABNORMAL LOW (ref 3.5–5.0)
Alkaline Phosphatase: 58 U/L (ref 38–126)
Anion gap: 12 (ref 5–15)
BUN: 22 mg/dL (ref 8–23)
CO2: 27 mmol/L (ref 22–32)
Calcium: 9.2 mg/dL (ref 8.9–10.3)
Chloride: 95 mmol/L — ABNORMAL LOW (ref 98–111)
Creatinine, Ser: 1.12 mg/dL (ref 0.61–1.24)
GFR, Estimated: >60 mL/min (ref >60)
Glucose, Bld: 383 mg/dL — ABNORMAL HIGH (ref 70–99)
Potassium: 4.6 mmol/L (ref 3.5–5.1)
Sodium: 134 mmol/L — ABNORMAL LOW (ref 135–145)
Total Bilirubin: 0.5 mg/dL (ref 0.0–1.2)
Total Protein: 5.7 g/dL — ABNORMAL LOW (ref 6.5–8.1)

## 2024-05-03 LAB — RESP PANEL BY RT-PCR (RSV, FLU A&B, COVID)  RVPGX2
Influenza A by PCR: NEGATIVE
Influenza B by PCR: NEGATIVE
Resp Syncytial Virus by PCR: NEGATIVE
SARS Coronavirus 2 by RT PCR: NEGATIVE

## 2024-05-03 LAB — BRAIN NATRIURETIC PEPTIDE: B Natriuretic Peptide: 93.4 pg/mL (ref 0.0–100.0)

## 2024-05-03 LAB — TROPONIN I (HIGH SENSITIVITY): Troponin I (High Sensitivity): 19 ng/L — ABNORMAL HIGH (ref <18)

## 2024-05-03 LAB — GLUCOSE, CAPILLARY: Glucose-Capillary: 289 mg/dL — ABNORMAL HIGH (ref 70–99)

## 2024-05-03 LAB — CBG MONITORING, ED
Glucose-Capillary: 370 mg/dL — ABNORMAL HIGH (ref 70–99)
Glucose-Capillary: 318 mg/dL — ABNORMAL HIGH (ref 70–99)

## 2024-05-03 MED ORDER — IPRATROPIUM-ALBUTEROL 0.5-2.5 (3) MG/3ML IN SOLN
3.0000 mL | Freq: Four times a day (QID) | RESPIRATORY_TRACT | Status: DC
  Administered 2024-05-03: 3 mL via RESPIRATORY_TRACT
  Filled 2024-05-03: qty 3

## 2024-05-03 MED ORDER — FAMOTIDINE 20 MG PO TABS
20.0000 mg | ORAL_TABLET | Freq: Every day | ORAL | Status: DC
  Administered 2024-05-03 – 2024-05-07 (×4): 20 mg via ORAL
  Filled 2024-05-03 (×5): qty 1

## 2024-05-03 MED ORDER — FUROSEMIDE 10 MG/ML IJ SOLN: 20.0000 mg | Freq: Every day | INTRAMUSCULAR | Status: DC

## 2024-05-03 MED ORDER — PANTOPRAZOLE SODIUM 40 MG PO TBEC
40.0000 mg | DELAYED_RELEASE_TABLET | Freq: Every day | ORAL | Status: DC
  Administered 2024-05-04 – 2024-05-08 (×4): 40 mg via ORAL
  Filled 2024-05-03 (×5): qty 1

## 2024-05-03 MED ORDER — ALBUTEROL SULFATE (2.5 MG/3ML) 0.083% IN NEBU
2.5000 mg | INHALATION_SOLUTION | RESPIRATORY_TRACT | Status: DC | PRN
  Administered 2024-05-03: 2.5 mg via RESPIRATORY_TRACT
  Filled 2024-05-03 (×2): qty 3

## 2024-05-03 MED ORDER — ONDANSETRON HCL 4 MG/2ML IJ SOLN: 4.0000 mg | Freq: Four times a day (QID) | INTRAMUSCULAR | Status: DC | PRN

## 2024-05-03 MED ORDER — GABAPENTIN 300 MG PO CAPS
300.0000 mg | ORAL_CAPSULE | Freq: Two times a day (BID) | ORAL | Status: DC
  Administered 2024-05-03 – 2024-05-05 (×4): 300 mg via ORAL
  Filled 2024-05-03 (×4): qty 1

## 2024-05-03 MED ORDER — TIOTROPIUM BROMIDE MONOHYDRATE 2.5 MCG/ACT IN AERS: 2.0000 | INHALATION_SPRAY | Freq: Every day | RESPIRATORY_TRACT | Status: DC

## 2024-05-03 MED ORDER — EZETIMIBE 10 MG PO TABS
10.0000 mg | ORAL_TABLET | Freq: Every day | ORAL | Status: DC
  Administered 2024-05-04 – 2024-05-08 (×4): 10 mg via ORAL
  Filled 2024-05-03 (×5): qty 1

## 2024-05-03 MED ORDER — POLYVINYL ALCOHOL 1.4 % OP SOLN
1.0000 [drp] | Freq: Every day | OPHTHALMIC | Status: DC
  Administered 2024-05-04 – 2024-05-08 (×4): 1 [drp] via OPHTHALMIC
  Filled 2024-05-03: qty 15

## 2024-05-03 MED ORDER — PROPYLENE GLYCOL 0.6 % OP SOLN: 1.0000 [drp] | Freq: Every morning | OPHTHALMIC | Status: DC

## 2024-05-03 MED ORDER — METHYLPREDNISOLONE SODIUM SUCC 125 MG IJ SOLR
60.0000 mg | Freq: Two times a day (BID) | INTRAMUSCULAR | Status: DC
  Administered 2024-05-04 – 2024-05-07 (×5): 60 mg via INTRAVENOUS
  Filled 2024-05-03 (×7): qty 2

## 2024-05-03 MED ORDER — INSULIN ASPART 100 UNIT/ML IJ SOLN
0.0000 [IU] | Freq: Three times a day (TID) | INTRAMUSCULAR | Status: DC
  Administered 2024-05-04 (×2): 5 [IU] via SUBCUTANEOUS
  Administered 2024-05-04: 15 [IU] via SUBCUTANEOUS
  Administered 2024-05-05 (×2): 8 [IU] via SUBCUTANEOUS
  Administered 2024-05-05: 5 [IU] via SUBCUTANEOUS
  Administered 2024-05-07: 3 [IU] via SUBCUTANEOUS
  Administered 2024-05-07: 2 [IU] via SUBCUTANEOUS
  Administered 2024-05-07: 11 [IU] via SUBCUTANEOUS
  Administered 2024-05-08: 3 [IU] via SUBCUTANEOUS
  Administered 2024-05-08: 8 [IU] via SUBCUTANEOUS
  Filled 2024-05-03: qty 0.15

## 2024-05-03 MED ORDER — TRANEXAMIC ACID FOR INHALATION
500.0000 mg | Freq: Once | RESPIRATORY_TRACT | Status: AC
  Administered 2024-05-03: 500 mg via RESPIRATORY_TRACT
  Filled 2024-05-03: qty 10

## 2024-05-03 MED ORDER — ACETAMINOPHEN 500 MG PO TABS: 1000.0000 mg | ORAL_TABLET | Freq: Four times a day (QID) | ORAL | Status: DC | PRN

## 2024-05-03 MED ORDER — FUROSEMIDE 10 MG/ML IJ SOLN
60.0000 mg | Freq: Once | INTRAMUSCULAR | Status: AC
  Administered 2024-05-03: 60 mg via INTRAVENOUS
  Filled 2024-05-03: qty 8

## 2024-05-03 MED ORDER — RANOLAZINE ER 500 MG PO TB12
500.0000 mg | ORAL_TABLET | Freq: Two times a day (BID) | ORAL | Status: DC
  Administered 2024-05-03 – 2024-05-08 (×8): 500 mg via ORAL
  Filled 2024-05-03 (×10): qty 1

## 2024-05-03 MED ORDER — POLYETHYLENE GLYCOL 3350 17 G PO PACK: 17.0000 g | PACK | Freq: Every day | ORAL | Status: DC | PRN

## 2024-05-03 MED ORDER — IPRATROPIUM-ALBUTEROL 0.5-2.5 (3) MG/3ML IN SOLN
3.0000 mL | RESPIRATORY_TRACT | Status: AC
  Administered 2024-05-03 (×2): 3 mL via RESPIRATORY_TRACT
  Filled 2024-05-03: qty 6

## 2024-05-03 MED ORDER — MELATONIN 3 MG PO TABS
6.0000 mg | ORAL_TABLET | Freq: Every evening | ORAL | Status: DC | PRN
  Administered 2024-05-07: 6 mg via ORAL
  Filled 2024-05-03 (×2): qty 2

## 2024-05-03 MED ORDER — FINASTERIDE 5 MG PO TABS
5.0000 mg | ORAL_TABLET | Freq: Every day | ORAL | Status: DC
  Administered 2024-05-03 – 2024-05-08 (×5): 5 mg via ORAL
  Filled 2024-05-03 (×6): qty 1

## 2024-05-03 MED ORDER — ATORVASTATIN CALCIUM 40 MG PO TABS
40.0000 mg | ORAL_TABLET | Freq: Every evening | ORAL | Status: DC
  Administered 2024-05-03 – 2024-05-07 (×4): 40 mg via ORAL
  Filled 2024-05-03 (×5): qty 1

## 2024-05-03 MED ORDER — CYCLOSPORINE 0.05 % OP EMUL
1.0000 [drp] | Freq: Two times a day (BID) | OPHTHALMIC | Status: DC
  Administered 2024-05-03 – 2024-05-08 (×8): 1 [drp] via OPHTHALMIC
  Filled 2024-05-03 (×10): qty 30

## 2024-05-03 MED ORDER — GUAIFENESIN ER 600 MG PO TB12
1200.0000 mg | ORAL_TABLET | Freq: Two times a day (BID) | ORAL | Status: DC
  Administered 2024-05-03 – 2024-05-08 (×8): 1200 mg via ORAL
  Filled 2024-05-03 (×10): qty 2

## 2024-05-03 MED ORDER — INSULIN GLARGINE-YFGN 100 UNIT/ML ~~LOC~~ SOLN
20.0000 [IU] | Freq: Every day | SUBCUTANEOUS | Status: DC
  Administered 2024-05-03: 20 [IU] via SUBCUTANEOUS
  Filled 2024-05-03 (×2): qty 0.2

## 2024-05-03 MED ORDER — METOPROLOL SUCCINATE ER 50 MG PO TB24
100.0000 mg | ORAL_TABLET | Freq: Every day | ORAL | Status: DC
  Administered 2024-05-04 – 2024-05-08 (×4): 100 mg via ORAL
  Filled 2024-05-03 (×5): qty 2

## 2024-05-03 MED ORDER — UMECLIDINIUM BROMIDE 62.5 MCG/ACT IN AEPB
1.0000 | INHALATION_SPRAY | Freq: Every day | RESPIRATORY_TRACT | Status: DC
  Administered 2024-05-04 – 2024-05-05 (×2): 1 via RESPIRATORY_TRACT
  Filled 2024-05-03: qty 7

## 2024-05-03 MED ORDER — FLUTICASONE FUROATE-VILANTEROL 200-25 MCG/ACT IN AEPB
1.0000 | INHALATION_SPRAY | Freq: Every day | RESPIRATORY_TRACT | Status: DC
  Administered 2024-05-04 – 2024-05-05 (×2): 1 via RESPIRATORY_TRACT
  Filled 2024-05-03: qty 28

## 2024-05-03 MED ORDER — IPRATROPIUM-ALBUTEROL 0.5-2.5 (3) MG/3ML IN SOLN
3.0000 mL | Freq: Four times a day (QID) | RESPIRATORY_TRACT | Status: DC
  Administered 2024-05-04 – 2024-05-08 (×14): 3 mL via RESPIRATORY_TRACT
  Filled 2024-05-03 (×18): qty 3

## 2024-05-03 MED ORDER — INSULIN ASPART 100 UNIT/ML IJ SOLN
5.0000 [IU] | Freq: Once | INTRAMUSCULAR | Status: AC
  Administered 2024-05-03: 5 [IU] via SUBCUTANEOUS
  Filled 2024-05-03: qty 0.05

## 2024-05-03 NOTE — Telephone Encounter (Addendum)
 Called the patient per scheduler request because she was concerned about her conversation with him . I called pt and during the call, the patient reported experiencing shortness of breath and needing to  turn up his oxygen.At times he sounded like he was gasping for breath. I told pt to call EMS and go to ED.   He said he just took a breathing treatment and will be okay . He has an appt with his PCP at 1100.I gave him strict ED precautions.

## 2024-05-03 NOTE — Telephone Encounter (Signed)
 I was asked to contact pt by our scheduler because pt sounded short of breath on the phone . I called pt and he did sound sob and at times it sounded like he was gasping for breath.  He said I think I need to turn up my oxygen . I told him to call EMS. He said that he just took his breathing treatment and said I am going to be ok. I see my PCP today at 1100. I gave him ED precautions.

## 2024-05-03 NOTE — ED Triage Notes (Signed)
 Pt was bib EMS from home. Pt has a hx of stage 4 lung cancer and diabetes. He was dc yesterday after a bronchotomy and has been coughing up pink sputum since. Pt is co increased SOB. EMS reported rales and wheezing. Pt given 5 mg of albuterol , 0.4 nitroglycerin , and 125 of solumedrol. Pt is normally on 4-6L of O2 and EMS brought him in on 6L with an O2 of 92%. CBG 338

## 2024-05-03 NOTE — H&P (Signed)
 History and Physical    Jose Patrick ZOX:[id number] DOB: 09/29/1951 DOA: 05/03/2024  PCP: [facility]   Patient coming from: Home   Chief Complaint:  Chief Complaint  Patient presents with   Shortness of Breath    HPI:  Jose Patrick is a 73 y.o. 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 He returns day after discharge with sudden worsening in his breathing. Reports was only using 2L O2 at home despite being Rx 4L at discharge. Ultimately due to symptoms increased to 2L -> 4L -> 6L. While at rest had significant SOB and associated chest pressure. Feels like his lungs are being compressed. He is having thick tenacious sputum described like white glue. And also scant hemoptysis (very small amount of blood tinged sputum at bedside reports similar to that at home). Had used nebulizers and inhalers as Rxd. Was on Steroid taper at time of discharge. Denies any other changes. Due to his symptoms returns to Ed.    Review of Systems:  ROS complete and negative except as marked above   Allergies  Allergen Reactions   Chlorzoxazone Swelling and Other (See Comments)    Psychosis  (Parafon Forte) ABSOLUTELY CANNOT HAVE THIS!!   Tramadol Other (See Comments)    Muscles Tightness     Prior to Admission medications   Medication Sig Start Date End Date Taking? Authorizing Provider  albuterol  (VENTOLIN  HFA) 108 (90 Base) MCG/ACT inhaler Inhale 2 puffs into the lungs every 6 (six) hours as needed for wheezing or shortness of breath. 03/19/22   Quillian Brunt, MD   aspirin  81 MG chewable tablet Chew 1 tablet (81 mg total) by mouth daily. 12/19/11   Roslyn Coombe, MD  atorvastatin  (LIPITOR) 40 MG tablet Take 1 tablet (40 mg total) by mouth daily. Patient taking differently: Take 40 mg by mouth every evening. 08/04/23   Thukkani, Arun K, MD  benzonatate  (TESSALON ) 200 MG capsule Take 1 capsule (200 mg total) by mouth 3 (three) times daily as needed for cough. Patient not taking: Reported on 04/19/2024 04/07/24   Armenta Landau, MD  Cholecalciferol  (VITAMIN D3) 1000 units CAPS Take 1,000 Units by mouth daily.    [provider]  cycloSPORINE (RESTASIS) 0.05 % ophthalmic emulsion Place 1 drop into both eyes 2 (two) times daily. 01/12/24   [provider]  dextromethorphan -guaiFENesin  (TUSSIN DM) 10-100 MG/5ML liquid Take 5 mLs by mouth every 4 (four) hours as needed for cough. Patient not taking: Reported on 04/19/2024 03/19/24   Antonio Baumgarten, NP  ezetimibe  (ZETIA ) 10 MG tablet Take 10 mg by mouth daily. 12/09/23   [provider]  famotidine  (PEPCID ) 20 MG tablet Take 1 tablet (20 mg total) by mouth at bedtime. 04/07/24   Armenta Landau, MD  feeding supplement (ENSURE ENLIVE / ENSURE PLUS) LIQD Take 237 mLs by mouth 2 (two) times daily between meals. Patient not taking: Reported on 04/19/2024 04/07/24   Armenta Landau, MD  ferrous sulfate  325 (65 FE) MG EC tablet Take 325 mg by mouth in the morning and at bedtime.    [provider]  finasteride  (PROSCAR ) 5 MG tablet Take 1 tablet (5 mg total) by mouth daily. 05/03/24   Vann, Jessica U, DO  fluticasone  (FLONASE ) 50 MCG/ACT nasal spray Place 2 sprays into both nostrils in the morning. 08/11/23   [provider]  fluticasone -salmeterol (WIXELA INHUB) 250-50 MCG/ACT AEPB ONE puff in the in morning and ONE puff in the evening Patient taking differently: Inhale 1 puff into the lungs in the morning. 01/14/24   Quillian Brunt, MD  furosemide  (LASIX ) 20 MG tablet Take 20  mg by mouth as needed for edema.    [provider]  gabapentin  (NEURONTIN ) 300 MG capsule Take 300 mg by mouth 2 (two) times daily. Take 300 mg by mouth in the morning & at bedtime- and an additional 300 mg once a day as needed for unresolved neuropathy    [provider]  glucose 4 GM chewable tablet Chew 1 tablet by mouth as needed for low blood sugar.    [provider]  guaiFENesin -codeine  100-10 MG/5ML syrup Take 5 mLs by mouth 2 (two) times daily as needed for cough.    [provider]  insulin  glargine (LANTUS ) 100 UNIT/ML injection Inject 0.25 mLs (25 Units total) into the skin daily. Patient taking differently: Inject 20-30 Units into the skin at bedtime. 04/07/24   Armenta Landau, MD  ipratropium-albuterol  (DUONEB) 0.5-2.5 (3) MG/3ML SOLN Take 3 mLs by nebulization 3 (three) times daily for 5 days, THEN 3 mLs every 6 (six) hours as needed. 04/07/24 04/12/25  Armenta Landau, MD  JARDIANCE  25 MG TABS tablet Take 25 mg by mouth at bedtime.    [provider]  losartan  (COZAAR ) 25 MG tablet Take 12.5 mg by mouth daily. 01/01/23   [provider]  Magnesium  Oxide 420 MG TABS Take 420 mg by mouth daily with breakfast.    [provider]  metFORMIN  (GLUCOPHAGE ) 1000 MG tablet Take 1,000 mg by mouth 2 (two) times daily. 09/14/21   [provider]  metoprolol  succinate (TOPROL -XL) 100 MG 24 hr tablet Take 100 mg by mouth daily. Take with or immediately following a meal.    [provider]  omeprazole  (PRILOSEC) 40 MG capsule Take 1 capsule (40 mg total) by mouth daily. Patient taking differently: Take 40 mg by mouth daily before breakfast. 04/07/24   Armenta Landau, MD  OXYGEN Inhale 2 L/min into the lungs continuous.    [provider]  predniSONE  (DELTASONE ) 10 MG tablet 30 mg x 2 days then 20mg  x 4 days then 10mg  x 4 days then 5 mg x 4 days then stop 05/03/24   Vann, Jessica U, DO  psyllium (METAMUCIL  SMOOTH TEXTURE) 58.6 % powder Take 1 packet by mouth daily. Patient not taking: Reported on 04/19/2024 12/20/21   Marnee Sink, MD  pyridoxine  (B-6) 100 MG tablet Take 100 mg by mouth daily.    [provider]  ranolazine  (RANEXA ) 500 MG 12 hr tablet Take 1 tablet (500 mg total) by mouth 2 (two) times daily. 02/11/23   Sonny Dust, MD  Semaglutide , 2 MG/DOSE, 8 MG/3ML SOPN Inject 2 mg into the skin every Sunday. 02/14/24   [provider]  SYSTANE BALANCE 0.6 % SOLN Place 1 drop into both eyes in the morning.    [provider]  Testosterone 1.62 % GEL Place 2 Pump onto the skin in the morning.    [provider]  Tiotropium Bromide  Monohydrate (SPIRIVA  RESPIMAT) 2.5 MCG/ACT AERS  Inhale 2 puffs into the lungs daily. 01/14/24   Quillian Brunt, MD    Past Medical History:  Diagnosis Date   ALLERGIC RHINITIS 10/08/2007   Qualifier: Diagnosis of  By: Autry Legions MD, Alveda Aures    BENIGN PROSTATIC HYPERTROPHY 09/28/2010   Qualifier: Diagnosis of  By: Autry Legions MD, Alveda Aures    Bipolar affective disorder (HCC) 09/30/2012   COLONIC POLYPS, HX OF 10/08/2007   Qualifier: Diagnosis of  By: Autry Legions MD, Alveda Aures    CONSTIPATION, CHRONIC, HX OF 08/10/2007   Qualifier: Diagnosis of  By: Jarold Merlin    DIABETES MELLITUS, TYPE II 10/08/2007   Qualifier: Diagnosis of  By: Autry Legions MD, Alveda Aures    ERECTILE DYSFUNCTION 10/08/2007   Qualifier: Diagnosis of  By: Autry Legions MD, Alveda Aures    HYPERLIPIDEMIA 10/08/2007   Qualifier: Diagnosis of  By: Autry Legions MD, Alveda Aures    HYPERTENSION 03/20/2009   Qualifier: Diagnosis of  By: Autry Legions MD, Alveda Aures    PTSD (post-traumatic stress disorder) 09/30/2012   SPINAL STENOSIS, LUMBAR 10/08/2007   Per Dr Larrie Po - [facility]   Qualifier: Diagnosis of  By: Autry Legions MD, Alveda Aures      Past Surgical History:  Procedure Laterality Date   COLONOSCOPY WITH PROPOFOL  N/A 04/09/2021   Procedure: COLONOSCOPY WITH PROPOFOL ;  Surgeon: Irby Mannan,  MD;  Location: [facility] ENDOSCOPY;  Service: Endoscopy;  Laterality: N/A;   cyst off right buttock     ESOPHAGOGASTRODUODENOSCOPY (EGD) WITH PROPOFOL  N/A 04/09/2021   Procedure: ESOPHAGOGASTRODUODENOSCOPY (EGD) WITH PROPOFOL ;  Surgeon: Irby Mannan, MD;  Location: [facility] ENDOSCOPY;  Service: Endoscopy;  Laterality: N/A;   PREPATELLAR BURSA EXCISION     removal right   s/p bilat hydrocelectomy  2008   x 2   TONSILLECTOMY     VIDEO BRONCHOSCOPY Right 04/22/2024   Procedure: BRONCHOSCOPY, WITH FLUOROSCOPY;  Surgeon: Denson Flake, MD;  Location: [facility] ENDOSCOPY;  Service: Cardiopulmonary;  Laterality: Right;     reports that he quit smoking about 12 years ago. His smoking use included cigarettes. He started smoking about 53 years ago. He has a 61.5 pack-year smoking history. He has never used smokeless tobacco. He reports current alcohol use. He reports that he does not use drugs.  Family History  Problem Relation Age of Onset   Cancer Mother    Ulcers Mother        Stomach   Depression Mother    Anxiety disorder Mother    Allergies Sister      Physical Exam: Vitals:   05/03/24 2000 05/03/24 2015 05/03/24 2017 05/03/24 2052  BP: 130/73 112/68  131/60  Pulse: 76 71  69  Resp: (!) 23 (!) 22  16  Temp:   98.3 F (36.8 C) 98.2 F (36.8 C)  TempSrc:   Oral Oral  SpO2: 93% 93%  93%  Weight:      Height:        Gen: Awake, alert, Chronically ill appearing.   CV: Regular, normal S1, S2, no murmurs  Resp: Mild increased WOB, tachypneic, speaking in short sentences, there is diffuse wheezing and rhonchi, diminished breath sounds at the R base.  Abd: Round, normoactive, nontender MSK: Symmetric, 2-3+ pitting edema to knee  Skin: No rashes or lesions to exposed skin  Neuro: Alert and interactive  Psych: euthymic, appropriate    Data review:   Labs reviewed, notable for:   7.38 / 55  Bicarb 27  Blood glucose 383  BNP 93, relative increase from prior  Hs trop ordered  WBC 14,  slight up from prior (on steroids)    Micro:  Results for orders placed or performed during the hospital encounter of 05/03/24  Resp panel by RT-PCR (RSV, Flu A&B, Covid) Anterior Nasal Swab     Status: None   Collection Time: 05/03/24  4:41 PM   Specimen: Anterior Nasal Swab  Result Value Ref Range Status   SARS Coronavirus 2 by RT PCR NEGATIVE NEGATIVE Final    Comment: (NOTE) SARS-CoV-2 target nucleic acids are NOT DETECTED.  The SARS-CoV-2 RNA is generally detectable in upper respiratory specimens during the acute phase of infection. The lowest concentration of SARS-CoV-2 viral copies this assay can detect is 138 copies/mL. A negative result does not preclude SARS-Cov-2 infection and should not be used as the sole basis for treatment or other patient management decisions. A negative result may occur with  improper specimen collection/handling, submission of specimen other than nasopharyngeal swab, presence of viral mutation(s) within the areas targeted by this assay, and inadequate number of viral copies(<138 copies/mL). A negative result must be combined with clinical observations, patient history, and epidemiological information. The expected result is Negative.  Fact Sheet for Patients:  BloggerCourse.com  Fact Sheet for Healthcare Providers:  SeriousBroker.it  This test is no t yet approved or cleared by the [place]  FDA and  has been authorized for detection and/or diagnosis of SARS-CoV-2 by FDA under an Emergency Use Authorization (EUA). This EUA will remain  in effect (meaning this test can be used) for the duration of the COVID-19 declaration under Section 564(b)(1) of the Act, 21 U.S.C.section 360bbb-3(b)(1), unless the authorization is terminated  or revoked sooner.       Influenza A by PCR NEGATIVE NEGATIVE Final   Influenza B by PCR NEGATIVE NEGATIVE Final    Comment: (NOTE) The Xpert Xpress  SARS-CoV-2/FLU/RSV plus assay is intended as an aid in the diagnosis of influenza from Nasopharyngeal swab specimens and should not be used as a sole basis for treatment. Nasal washings and aspirates are unacceptable for Xpert Xpress SARS-CoV-2/FLU/RSV testing.  Fact Sheet for Patients: BloggerCourse.com  Fact Sheet for Healthcare Providers: SeriousBroker.it  This test is not yet approved or cleared by the [place]  FDA and has been authorized for detection and/or diagnosis of SARS-CoV-2 by FDA under an Emergency Use Authorization (EUA). This EUA will remain in effect (meaning this test can be used) for the duration of the COVID-19 declaration under Section 564(b)(1) of the Act, 21 U.S.C. section 360bbb-3(b)(1), unless the authorization is terminated or revoked.     Resp Syncytial Virus by PCR NEGATIVE NEGATIVE Final    Comment: (NOTE) Fact Sheet for Patients: BloggerCourse.com  Fact Sheet for Healthcare Providers: SeriousBroker.it  This test is not yet approved or cleared by the [place]  FDA and has been authorized for detection and/or diagnosis of SARS-CoV-2 by FDA under an Emergency Use Authorization (EUA). This EUA will remain in effect (meaning this test can be used) for the duration of the COVID-19 declaration under Section 564(b)(1) of the Act, 21 U.S.C. section 360bbb-3(b)(1), unless the authorization is terminated or revoked.  Performed at [facility], [address] [place], [place] [address]     Imaging reviewed:  DG Chest Portable 1 View Result Date: 05/03/2024 CLINICAL DATA:  Stage IV lung cancer, diabetes, productive cough after bronchoscopy yesterday EXAM: PORTABLE CHEST 1 VIEW COMPARISON:  04/25/2024 FINDINGS: Single frontal view of the chest demonstrates  a stable cardiac silhouette. There is widespread bilateral airspace disease,  right greater than left, increased since prior study. Underlying effusions are suspected, right greater than left. No pneumothorax. No acute bony abnormalities. IMPRESSION: 1. Progressive widespread bilateral airspace disease, right greater than left, which could reflect worsening infection, edema, or hemorrhage. 2. Bilateral pleural effusions, right greater than left. Electronically Signed   By: Bobbye Burrow M.D.   On: 05/03/2024 17:33    Personally reviewed CXR L is new; R is old     EKG  Personally reviewed  SR, no acute ischemic changes   ED Course:  Treated with TXA neb, Lasix  60 mg IV, nebs    Assessment/Plan:  73 y.o. 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 He returns day after discharge with sudden worsening in his breathing.  Acute on chronic hypoxic respiratory failure, stable from past admission 4L O2  Shortness of breath, worsening  Stage IV NSCLC  Bilateral pleural effusion L > R, symptomatic  COPD with ongoing exacerbation  Hx recent diagnosis Stage IV NSCLC with Bronchoscopy and Bx 6/5 consistent with NSCLC throughout the R lung; pathology consistent with adenocarcinoma. Additional lung nodules noted in the L lung. Bilateral effusion also suspect metastatic. Overall suspect he is symptomatic from underlying cancer, with additional contributors from pleural effusion on the R, and his underlying COPD. Only other factor may be slight hypervolemia which we will treat as below. Did have recent rhinovirus pneumonia 6/2, but otherwise evaluation for bacterial pneumonia  unrevealing; BAL Culture 6/5 no growth. Although having hemoptysis this is scant and doubt contributing. Remains on 4L O2 with stable O2 sat. CXR included above with increased bilateral airspace disease R > L.  -Hold off on initiation of antibiotics as does not appear consistent with infection -S/p TXA neb, currently not requiring additional treatments  -s/p methylprednisolone , continue 60 mg IV every 12 hours for now.  -IR eval for thoracentesis with symptomatic effusion, ? May consider pleurX. Npo after MN  -Pulmonary toilet: Continue home Wixela equivalent, Spiriva , DuoNeb every 6 hours, albuterol  every 4 hours as needed, I-S, flutter valve, out of bed to chair, guaifenesin  to thin secretions -Palliative care evaluation for symptomatic management of dyspnea, and to assist with goals of care; see discussion below  -Will need Oncology followup to consider treatment options  HFpEF, with mild exacerbation  Mild Hypervolemia  Last TTE 9/'24 with LVEF 55-60%, mild cLVH, grade 1 diastolic dysfunction. Despite relative low BNP has increased and may be low related to weight. Has significant lower extremity edema. Although main contributors related to above, will continue with gentle diuresis.  -S/p Lasix  60 mg IV x 1 in the ED.  Tentatively ordered for 20 mg IV daily for now; Holding home lasix  (20 mg prn) while on IV diuresis  -Repeat echo -Daily weights, I's and O's  DM type II with hyperglycemia, I/s/o steroids  Home insulin  regimen glargine 25 units; also on semaglutide , Jardiance , metformin  -Since n.p.o. for thoracentesis, modest reduction to 20 units basal nightly -Add SSI for moderate -Holding home oral agents   Goals of care  Discussed code status, currently wishes to  be Full code. We discussed his current illness and he understands that he has an incurable lung cancer and that treatment options at this stage are generally palliative in approach. He does wish to see oncology and discuss  treatment options available. He is very symptomatic from breathing standpoint which I think is mainly related to his cancer, and he is interested in discussing symptomatic management with palliative care. I think ongoing goals of care with assistance from palliative would also be helpful, particularly in terms of his code status and expected outcomes.  - Continue full code for now - Palliative care consult ordered, appreciate assistance with symptomatic management and goals of care.  Chronic medical problems:  OSA: BiPAP nightly while inpatient.  On CPAP at home. CAD, chronic angina / HLD: Aspirin  had been discontinued last admission.  Continue home atorvastatin , Zetia , metoprolol , ranolazine .  HTN: Continue home Metoprolol . Hold home Losartan  as BP reasonably controlled  GERD: continue home famotidine , sub home PPI  BPH: continue home finasteride   Bipolar d/o, PTSD: noted not on treatment   Body mass index is 30 kg/m. Obesity class I    DVT prophylaxis:  SCDs Code Status:  Full Code Diet:  Diet Orders (From admission, onward)     Start     Ordered   05/04/24 0001  Diet NPO time specified  Diet effective midnight        05/03/24 2109   05/03/24 2109  Diet regular Room service appropriate? Yes; Fluid consistency: Thin  Diet effective now       Question Answer Comment  Room service appropriate? Yes   Fluid consistency: Thin      05/03/24 2109           Family Communication:  Yes discussed with wife and sister at bedside   Consults:  Palliative   Admission status:   Observation, Telemetry bed  Severity of Illness: The appropriate patient status for this patient is OBSERVATION. Observation status is judged to be reasonable and necessary in order to provide the required intensity of service to ensure the patient's safety. The patient's presenting symptoms, physical exam findings, and initial radiographic and laboratory data in the context of their medical condition is felt to place  them at decreased risk for further clinical deterioration. Furthermore, it is anticipated that the patient will be medically stable for discharge from the hospital within 2 midnights of admission.    Arnulfo Larch, MD [facility]  How to contact the [facility] Attending or Consulting provider 7A - 7P or covering provider during after hours 7P -7A, for this patient.  Check the care team in [facility] and look for a) attending/consulting [facility] provider listed and b) the [facility] team listed Log into [email or web address] and use [facility] universal password to access. If you do not have the password, please contact the hospital operator. Locate the [facility] provider you are looking for under [facility] and page to a number that you can be directly reached. If you still have difficulty reaching the provider, please page the [facility] (Director on Call) for the Hospitalists listed on amion for assistance.  05/03/2024, 9:20 PM

## 2024-05-03 NOTE — ED Provider Notes (Signed)
 [place] EMERGENCY DEPARTMENT AT [facility] Provider Note   CSN: [id number] Arrival date & time: 05/03/24  1605     History  Chief Complaint  Patient presents with   Shortness of Breath    Jose Patrick is a 73 y.o. male with COPD with chronic respiratory failure at baseline 2 L/min.  Non-small cell lung cancer status post lobectomy, T2DM, CKD, lumbar spinal stenosis, BPH, HTN, HLD bipolar disorder, schizoaffective disorder, PTSD  who presents with recurrent dyspnea.Patient was discharged yesterday after admission from 04/19/24-05/02/24 with dyspnea and hypoxia into the 70s found to have bilateral pneumonia.  CT evidence of right lower lung airspace disease in addition to developing diffuse nodular infiltrations at the left lung possible sequelae of recurrent pneumonia versus radiation pneumonitis versus organizing pneumonia.  Also concern for persistent cancer and lymphangitic spread.  Had bronchoscopy on 6/5 which didn't suggest infection, pathology pending.  Cytology was consistent with non-small cell lung cancer.  Pulmonology placed patient empirically on steroids, cefepime , and doxycycline .  Weaned to his baseline 2 L/min and discharged with prednisone  taper and plans to get PET scan as an outpatient.  When patient at home yesterday he said that he immediately felt short of breath again.  He had occurred increase his oxygen from 2 to 4 L/min.  He also had recurrence of coughing, crackling/rales in his chest, and hemoptysis. He endorses chest pain with coughing and leg swelling. States they gave him lasix  in hospital yesterday prior to DC at 3 pm but he hasn't had it since then. Couldn't sleep all night d/t dyspnea/orthopnea. He states his hemoptysis had stopped while inpatient started again as soon as he got home.   By EMS patient was given 120 mg IV Solu-Medrol , 5 mg albuterol , 0.4 mg sublingual nitroglycerin .   Past Medical History:  Diagnosis Date   ALLERGIC RHINITIS  10/08/2007   Qualifier: Diagnosis of  By: Autry Legions MD, Alveda Aures    BENIGN PROSTATIC HYPERTROPHY 09/28/2010   Qualifier: Diagnosis of  By: Autry Legions MD, Alveda Aures    Bipolar affective disorder (HCC) 09/30/2012   COLONIC POLYPS, HX OF 10/08/2007   Qualifier: Diagnosis of  By: Autry Legions MD, Alveda Aures    CONSTIPATION, CHRONIC, HX OF 08/10/2007   Qualifier: Diagnosis of  By: Jarold Merlin    DIABETES MELLITUS, TYPE II 10/08/2007   Qualifier: Diagnosis of  By: Autry Legions MD, Alveda Aures    ERECTILE DYSFUNCTION 10/08/2007   Qualifier: Diagnosis of  By: Autry Legions MD, Alveda Aures    HYPERLIPIDEMIA 10/08/2007   Qualifier: Diagnosis of  By: Autry Legions MD, Alveda Aures    HYPERTENSION 03/20/2009   Qualifier: Diagnosis of  By: Autry Legions MD, Alveda Aures    PTSD (post-traumatic stress disorder) 09/30/2012   SPINAL STENOSIS, LUMBAR 10/08/2007   Per Dr Larrie Po - [facility]   Qualifier: Diagnosis of  By: Autry Legions MD, Alveda Aures         Home Medications Prior to Admission medications   Medication Sig Start Date End Date Taking? Authorizing Provider  albuterol  (VENTOLIN  HFA) 108 (90 Base) MCG/ACT inhaler Inhale 2 puffs into the lungs every 6 (six) hours as needed for wheezing or shortness of breath. 03/19/22   Quillian Brunt, MD  aspirin  81 MG chewable tablet Chew 1 tablet (81 mg total) by mouth daily. 12/19/11   Roslyn Coombe, MD  atorvastatin  (LIPITOR) 40 MG tablet Take 1 tablet (40 mg total) by mouth daily. Patient taking differently: Take 40 mg by  mouth every evening. 08/04/23   Thukkani, Arun K, MD  benzonatate  (TESSALON ) 200 MG capsule Take 1 capsule (200 mg total) by mouth 3 (three) times daily as needed for cough. Patient not taking: Reported on 04/19/2024 04/07/24   Armenta Landau, MD  Cholecalciferol  (VITAMIN D3) 1000 units CAPS Take 1,000 Units by mouth daily.    [provider]  cycloSPORINE (RESTASIS) 0.05 % ophthalmic emulsion Place 1 drop into both eyes 2 (two) times daily. 01/12/24   [provider]   dextromethorphan -guaiFENesin  (TUSSIN DM) 10-100 MG/5ML liquid Take 5 mLs by mouth every 4 (four) hours as needed for cough. Patient not taking: Reported on 04/19/2024 03/19/24   Antonio Baumgarten, NP  ezetimibe  (ZETIA ) 10 MG tablet Take 10 mg by mouth daily. 12/09/23   [provider]  famotidine  (PEPCID ) 20 MG tablet Take 1 tablet (20 mg total) by mouth at bedtime. 04/07/24   Armenta Landau, MD  feeding supplement (ENSURE ENLIVE / ENSURE PLUS) LIQD Take 237 mLs by mouth 2 (two) times daily between meals. Patient not taking: Reported on 04/19/2024 04/07/24   Armenta Landau, MD  ferrous sulfate  325 (65 FE) MG EC tablet Take 325 mg by mouth in the morning and at bedtime.    [provider]  finasteride  (PROSCAR ) 5 MG tablet Take 1 tablet (5 mg total) by mouth daily. 05/03/24   Vann, Jessica U, DO  fluticasone  (FLONASE ) 50 MCG/ACT nasal spray Place 2 sprays into both nostrils in the morning. 08/11/23   [provider]  fluticasone -salmeterol (WIXELA INHUB) 250-50 MCG/ACT AEPB ONE puff in the in morning and ONE puff in the evening Patient taking differently: Inhale 1 puff into the lungs in the morning. 01/14/24   Quillian Brunt, MD  furosemide  (LASIX ) 20 MG tablet Take 20 mg by mouth as needed for edema.    [provider]  gabapentin  (NEURONTIN ) 300 MG capsule Take 300 mg by mouth 2 (two) times daily. Take 300 mg by mouth in the morning & at bedtime- and an additional 300 mg once a day as needed for unresolved neuropathy    [provider]  glucose 4 GM chewable tablet Chew 1 tablet by mouth as needed for low blood sugar.    [provider]  guaiFENesin -codeine  100-10 MG/5ML syrup Take 5 mLs by mouth 2 (two) times daily as needed for cough.    [provider]  insulin  glargine (LANTUS ) 100 UNIT/ML injection Inject 0.25 mLs (25 Units total) into the skin daily. Patient taking differently: Inject 20-30 Units into the skin at bedtime. 04/07/24    Armenta Landau, MD  ipratropium-albuterol  (DUONEB) 0.5-2.5 (3) MG/3ML SOLN Take 3 mLs by nebulization 3 (three) times daily for 5 days, THEN 3 mLs every 6 (six) hours as needed. 04/07/24 04/12/25  Armenta Landau, MD  JARDIANCE  25 MG TABS tablet Take 25 mg by mouth at bedtime.    [provider]  losartan  (COZAAR ) 25 MG tablet Take 12.5 mg by mouth daily. 01/01/23   [provider]  Magnesium  Oxide 420 MG TABS Take 420 mg by mouth daily with breakfast.    [provider]  metFORMIN  (GLUCOPHAGE ) 1000 MG tablet Take 1,000 mg by mouth 2 (two) times daily. 09/14/21   [provider]  metoprolol  succinate (TOPROL -XL) 100 MG 24 hr tablet Take 100 mg by mouth daily. Take with or immediately following a meal.    [provider]  omeprazole  (PRILOSEC) 40 MG capsule Take  1 capsule (40 mg total) by mouth daily. Patient taking differently: Take 40 mg by mouth daily before breakfast. 04/07/24   Armenta Landau, MD  OXYGEN Inhale 2 L/min into the lungs continuous.    [provider]  predniSONE  (DELTASONE ) 10 MG tablet 30 mg x 2 days then 20mg  x 4 days then 10mg  x 4 days then 5 mg x 4 days then stop 05/03/24   Vann, Jessica U, DO  psyllium (METAMUCIL SMOOTH TEXTURE) 58.6 % powder Take 1 packet by mouth daily. Patient not taking: Reported on 04/19/2024 12/20/21   Marnee Sink, MD  pyridoxine  (B-6) 100 MG tablet Take 100 mg by mouth daily.    [provider]  ranolazine  (RANEXA ) 500 MG 12 hr tablet Take 1 tablet (500 mg total) by mouth 2 (two) times daily. 02/11/23   Sonny Dust, MD  Semaglutide , 2 MG/DOSE, 8 MG/3ML SOPN Inject 2 mg into the skin every Sunday. 02/14/24   [provider]  SYSTANE BALANCE 0.6 % SOLN Place 1 drop into both eyes in the morning.    [provider]  Testosterone 1.62 % GEL Place 2 Pump onto the skin in the morning.    [provider]  Tiotropium Bromide  Monohydrate (SPIRIVA  RESPIMAT) 2.5  MCG/ACT AERS Inhale 2 puffs into the lungs daily. 01/14/24   Quillian Brunt, MD      Allergies    Chlorzoxazone and Tramadol    Review of Systems   Review of Systems A 10 point review of systems was performed and is negative unless otherwise reported in HPI.  Physical Exam Updated Vital Signs BP 112/68   Pulse 71   Temp 98.3 F (36.8 C) (Oral)   Resp (!) 22   Ht 5' 8 (1.727 m)   Wt 89.5 kg   SpO2 93%   BMI 30.00 kg/m  Physical Exam General: Ill-appearing male, lying in bed.  HEENT: PERRLA, Sclera anicteric, MMM, trachea midline.  Cardiology: RRR, no murmurs/rubs/gallops. BL radial and DP pulses equal bilaterally.  Resp: Mild tachypnea w/ mildly increased WOB. Satting 91% on 4L [place] with frequent coughing productive of bloody sputum. +diffuse rhonchi in all lung fields. Abd: Soft, non-tender, non-distended. No rebound tenderness or guarding.  GU: Deferred. MSK: No peripheral edema or signs of trauma. Extremities without deformity or TTP. No cyanosis or clubbing. Skin: warm, dry. No rashes or lesions. Back: No CVA tenderness Neuro: A&Ox4, CNs II-XII grossly intact. MAEs. Sensation grossly intact.   ED Results / Procedures / Treatments   Labs (all labs ordered are listed, but only abnormal results are displayed) Labs Reviewed  CBC WITH DIFFERENTIAL/PLATELET - Abnormal; Notable for the following components:      Result Value   WBC 14.3 (*)    RDW 15.6 (*)    Neutro Abs 12.7 (*)    All other components within normal limits  BLOOD GAS, VENOUS - Abnormal; Notable for the following components:   pO2, Ven 48 (*)    Bicarbonate 32.5 (*)    Acid-Base Excess 5.8 (*)    All other components within normal limits  COMPREHENSIVE METABOLIC PANEL WITH GFR - Abnormal; Notable for the following components:   Sodium 134 (*)    Chloride 95 (*)    Glucose, Bld 383 (*)    Total Protein 5.7 (*)    Albumin 2.5 (*)    ALT 58 (*)    All other components within normal limits  CBG  MONITORING, ED - Abnormal; Notable for  the following components:   Glucose-Capillary 370 (*)    All other components within normal limits  I-STAT CHEM 8, ED - Abnormal; Notable for the following components:   Sodium 134 (*)    Chloride 95 (*)    BUN 26 (*)    Glucose, Bld 404 (*)    All other components within normal limits  CBG MONITORING, ED - Abnormal; Notable for the following components:   Glucose-Capillary 318 (*)    All other components within normal limits  RESP PANEL BY RT-PCR (RSV, FLU A&B, COVID)  RVPGX2  BRAIN NATRIURETIC PEPTIDE  BASIC METABOLIC PANEL WITH GFR  CBC  MAGNESIUM   PHOSPHORUS  PROCALCITONIN  I-STAT VENOUS BLOOD GAS, ED  TROPONIN I (HIGH SENSITIVITY)    EKG EKG Interpretation Date/Time:  Monday May 03 2024 16:16:33 EDT Ventricular Rate:  87 PR Interval:  155 QRS Duration:  75 QT Interval:  361 QTC Calculation: 435 R Axis:   17  Text Interpretation: Sinus rhythm Probable left atrial enlargement Abnormal R-wave progression, early transition Confirmed by Annita Kindle [phone]) on 05/03/2024 5:31:51 PM  Radiology DG Chest Portable 1 View Result Date: 05/03/2024 CLINICAL DATA:  Stage IV lung cancer, diabetes, productive cough after bronchoscopy yesterday EXAM: PORTABLE CHEST 1 VIEW COMPARISON:  04/25/2024 FINDINGS: Single frontal view of the chest demonstrates a stable cardiac silhouette. There is widespread bilateral airspace disease, right greater than left, increased since prior study. Underlying effusions are suspected, right greater than left. No pneumothorax. No acute bony abnormalities. IMPRESSION: 1. Progressive widespread bilateral airspace disease, right greater than left, which could reflect worsening infection, edema, or hemorrhage. 2. Bilateral pleural effusions, right greater than left. Electronically Signed   By: Bobbye Burrow M.D.   On: 05/03/2024 17:33    Procedures .Critical Care  Performed by: Merdis Stalling, MD Authorized by: Merdis Stalling, MD   Critical care provider statement:    Critical care time (minutes):  30   Critical care was necessary to treat or prevent imminent or life-threatening deterioration of the following conditions:  Respiratory failure   Critical care was time spent personally by me on the following activities:  Development of treatment plan with patient or surrogate, discussions with consultants, evaluation of patient's response to treatment, examination of patient, ordering and review of laboratory studies, ordering and review of radiographic studies, ordering and performing treatments and interventions, pulse oximetry, re-evaluation of patient's condition, review of old charts and obtaining history from patient or surrogate   Care discussed with: admitting provider       Medications Ordered in ED Medications  tranexamic acid  (CYKLOKAPRON ) 1000 MG/10ML nebulizer solution 500 mg (500 mg Nebulization Given 05/03/24 1750)  ipratropium-albuterol  (DUONEB) 0.5-2.5 (3) MG/3ML nebulizer solution 3 mL (3 mLs Nebulization Given 05/03/24 1813)  furosemide  (LASIX ) injection 60 mg (60 mg Intravenous Given 05/03/24 1815)    ED Course/ Medical Decision Making/ A&P                          Medical Decision Making Amount and/or Complexity of Data Reviewed Labs: ordered. Decision-making details documented in ED Course. Radiology: ordered. Decision-making details documented in ED Course.  Risk Prescription drug management. Decision regarding hospitalization.    This patient presents to the ED for concern of SOB/resp distress, this involves an extensive number of treatment options, and is a complaint that carries with it a high risk of complications and morbidity.  I considered the following differential and admission  for this acute, potentially life threatening condition.   MDM:    Patient with significant rhonchi, mild respiratory distress on presentation with tachypnea and satting 93% on 4 L nasal cannula.   He is noted to have hemoptysis in the room.  Will treat with TXA nebulizer as well as 2 additional DuoNebs in addition to his albuterol  and Solu-Medrol  he received by EMS.  Consider multifocal pneumonia, pulmonary edema, pleural effusion, diffuse alveolar hemorrhage.  Patient's chest x-ray shows diffuse progressive widespread bilateral airspace disease, right greater than left as well as bilateral right greater than left pleural effusions.  Treated patient with 60 mg IV Lasix  though his BNP does result as normal.  Respiratory panel is negative and he is not hypercarbic.  Will discuss with hospitalist for admission who will likely need to consult with pulmonology.   Clinical Course as of 05/03/24 2050  Mon May 03, 2024  1722 Glucose-Capillary(!): 370 Hyperglycemia [HN]  1730 WBC(!): 14.3 +leukocytosis, icnreasing [HN]  1731 pCO2, Ven: 55 No hypercarbia [HN]  1756 Resp panel by RT-PCR (RSV, Flu A&B, Covid) Anterior Nasal Swab neg [HN]  1757 DG Chest Portable 1 View 1. Progressive widespread bilateral airspace disease, right greater than left, which could reflect worsening infection, edema, or hemorrhage. 2. Bilateral pleural effusions, right greater than left.   [HN]  1757 Giving 60 mg IV lasix  [HN]  1757 Potassium: 4.7 [HN]    Clinical Course User Index [HN] Merdis Stalling, MD    Labs: I Ordered, and personally interpreted labs.  The pertinent results include:  those listed above  Imaging Studies ordered: I ordered imaging studies including CXR I independently visualized and interpreted imaging. I agree with the radiologist interpretation  Additional history obtained from chart review.    Cardiac Monitoring: The patient was maintained on a cardiac monitor.  I personally viewed and interpreted the cardiac monitored which showed an underlying rhythm of: NSR  Reevaluation: After the interventions noted above, I reevaluated the patient and found that they have :improved  significantly. He appears much more comfortable and is no longer having hemoptysis. Still feels SOB but his cough has improved he states.  Social Determinants of Health: Lives independently  Disposition:  Admitted to hospitalist  Co morbidities that complicate the patient evaluation  Past Medical History:  Diagnosis Date   ALLERGIC RHINITIS 10/08/2007   Qualifier: Diagnosis of  By: Autry Legions MD, Alveda Aures    BENIGN PROSTATIC HYPERTROPHY 09/28/2010   Qualifier: Diagnosis of  By: Autry Legions MD, Alveda Aures    Bipolar affective disorder (HCC) 09/30/2012   COLONIC POLYPS, HX OF 10/08/2007   Qualifier: Diagnosis of  By: Autry Legions MD, Alveda Aures    CONSTIPATION, CHRONIC, HX OF 08/10/2007   Qualifier: Diagnosis of  By: Jarold Merlin    DIABETES MELLITUS, TYPE II 10/08/2007   Qualifier: Diagnosis of  By: Autry Legions MD, Alveda Aures    ERECTILE DYSFUNCTION 10/08/2007   Qualifier: Diagnosis of  By: Autry Legions MD, Alveda Aures    HYPERLIPIDEMIA 10/08/2007   Qualifier: Diagnosis of  By: Autry Legions MD, Alveda Aures    HYPERTENSION 03/20/2009   Qualifier: Diagnosis of  By: Autry Legions MD, Alveda Aures    PTSD (post-traumatic stress disorder) 09/30/2012   SPINAL STENOSIS, LUMBAR 10/08/2007   Per Dr Larrie Po - [facility]   Qualifier: Diagnosis of  By: Autry Legions MD, Alveda Aures       Medicines Meds ordered this encounter  Medications   tranexamic acid  (CYKLOKAPRON ) 1000 MG/10ML nebulizer solution  500 mg   ipratropium-albuterol  (DUONEB) 0.5-2.5 (3) MG/3ML nebulizer solution 3 mL   furosemide  (LASIX ) injection 60 mg   acetaminophen  (TYLENOL ) tablet 1,000 mg   melatonin tablet 6 mg   ondansetron  (ZOFRAN ) injection 4 mg   polyethylene glycol (MIRALAX  / GLYCOLAX ) packet 17 g   ipratropium-albuterol  (DUONEB) 0.5-2.5 (3) MG/3ML nebulizer solution 3 mL   albuterol  (PROVENTIL ) (2.5 MG/3ML) 0.083% nebulizer solution 2.5 mg   methylPREDNISolone  sodium succinate (SOLU-MEDROL ) 125 mg/2 mL injection 60 mg   atorvastatin  (LIPITOR) tablet 40 mg   ezetimibe   (ZETIA ) tablet 10 mg   furosemide  (LASIX ) injection 20 mg   metoprolol  succinate (TOPROL -XL) 24 hr tablet 100 mg    Take with or immediately following a meal.     ranolazine  (RANEXA ) 12 hr tablet 500 mg   insulin  glargine-yfgn (SEMGLEE ) injection 20 Units   famotidine  (PEPCID ) tablet 20 mg   pantoprazole  (PROTONIX ) EC tablet 40 mg   finasteride  (PROSCAR ) tablet 5 mg   gabapentin  (NEURONTIN ) capsule 300 mg    Take 300 mg by mouth in the morning & at bedtime- and an additional 300 mg once a day as needed for unresolved neuropathy     guaiFENesin  (MUCINEX ) 12 hr tablet 1,200 mg   fluticasone  furoate-vilanterol (BREO ELLIPTA ) 200-25 MCG/ACT 1 puff   DISCONTD: Tiotropium Bromide  Monohydrate AERS 2 puff   cycloSPORINE (RESTASIS) 0.05 % ophthalmic emulsion 1 drop   DISCONTD: Propylene Glycol 0.6 % SOLN 1 drop   insulin  aspart (novoLOG ) injection 0-15 Units    Correction coverage::   Moderate (average weight, post-op)    CBG < 70::   Implement Hypoglycemia Standing Orders and refer to Hypoglycemia Standing Orders sidebar report    CBG 70 - 120::   0 units    CBG 121 - 150::   2 units    CBG 151 - 200::   3 units    CBG 201 - 250::   5 units    CBG 251 - 300::   8 units    CBG 301 - 350::   11 units    CBG 351 - 400::   15 units    CBG > 400:   call MD and obtain STAT lab verification   insulin  aspart (novoLOG ) injection 5 Units   artificial tears ophthalmic solution 1 drop   umeclidinium bromide  (INCRUSE ELLIPTA ) 62.5 MCG/ACT 1 puff    I have reviewed the patients home medicines and have made adjustments as needed  Problem List / ED Course: Problem List Items Addressed This Visit   None Visit Diagnoses       Hemoptysis    -  Primary     Acute on chronic hypoxic respiratory failure (HCC)                       This note was created using dictation software, which may contain spelling or grammatical errors.    Merdis Stalling, MD 05/03/24 [phone]

## 2024-05-04 ENCOUNTER — Observation Stay (HOSPITAL_COMMUNITY)

## 2024-05-04 ENCOUNTER — Inpatient Hospital Stay (HOSPITAL_COMMUNITY)

## 2024-05-04 DIAGNOSIS — C3431 Malignant neoplasm of lower lobe, right bronchus or lung: Secondary | ICD-10-CM | POA: Diagnosis present

## 2024-05-04 DIAGNOSIS — N400 Enlarged prostate without lower urinary tract symptoms: Secondary | ICD-10-CM | POA: Diagnosis present

## 2024-05-04 DIAGNOSIS — I5033 Acute on chronic diastolic (congestive) heart failure: Secondary | ICD-10-CM | POA: Diagnosis present

## 2024-05-04 DIAGNOSIS — E785 Hyperlipidemia, unspecified: Secondary | ICD-10-CM | POA: Diagnosis present

## 2024-05-04 DIAGNOSIS — C3490 Malignant neoplasm of unspecified part of unspecified bronchus or lung: Secondary | ICD-10-CM

## 2024-05-04 DIAGNOSIS — I130 Hypertensive heart and chronic kidney disease with heart failure and stage 1 through stage 4 chronic kidney disease, or unspecified chronic kidney disease: Secondary | ICD-10-CM | POA: Diagnosis present

## 2024-05-04 DIAGNOSIS — E1165 Type 2 diabetes mellitus with hyperglycemia: Secondary | ICD-10-CM | POA: Diagnosis present

## 2024-05-04 DIAGNOSIS — Z79899 Other long term (current) drug therapy: Secondary | ICD-10-CM | POA: Diagnosis not present

## 2024-05-04 DIAGNOSIS — J9621 Acute and chronic respiratory failure with hypoxia: Secondary | ICD-10-CM

## 2024-05-04 DIAGNOSIS — Z6836 Body mass index (BMI) 36.0-36.9, adult: Secondary | ICD-10-CM | POA: Diagnosis not present

## 2024-05-04 DIAGNOSIS — F259 Schizoaffective disorder, unspecified: Secondary | ICD-10-CM | POA: Diagnosis present

## 2024-05-04 DIAGNOSIS — F319 Bipolar disorder, unspecified: Secondary | ICD-10-CM | POA: Diagnosis present

## 2024-05-04 DIAGNOSIS — J439 Emphysema, unspecified: Secondary | ICD-10-CM | POA: Diagnosis present

## 2024-05-04 DIAGNOSIS — E1122 Type 2 diabetes mellitus with diabetic chronic kidney disease: Secondary | ICD-10-CM | POA: Diagnosis present

## 2024-05-04 DIAGNOSIS — C799 Secondary malignant neoplasm of unspecified site: Secondary | ICD-10-CM | POA: Diagnosis present

## 2024-05-04 DIAGNOSIS — Z515 Encounter for palliative care: Principal | ICD-10-CM

## 2024-05-04 DIAGNOSIS — R042 Hemoptysis: Secondary | ICD-10-CM | POA: Diagnosis present

## 2024-05-04 DIAGNOSIS — I2510 Atherosclerotic heart disease of native coronary artery without angina pectoris: Secondary | ICD-10-CM | POA: Diagnosis present

## 2024-05-04 DIAGNOSIS — C3491 Malignant neoplasm of unspecified part of right bronchus or lung: Secondary | ICD-10-CM | POA: Diagnosis not present

## 2024-05-04 DIAGNOSIS — Z66 Do not resuscitate: Secondary | ICD-10-CM

## 2024-05-04 DIAGNOSIS — J441 Chronic obstructive pulmonary disease with (acute) exacerbation: Secondary | ICD-10-CM | POA: Diagnosis present

## 2024-05-04 DIAGNOSIS — C3411 Malignant neoplasm of upper lobe, right bronchus or lung: Secondary | ICD-10-CM | POA: Diagnosis present

## 2024-05-04 DIAGNOSIS — J449 Chronic obstructive pulmonary disease, unspecified: Secondary | ICD-10-CM | POA: Diagnosis not present

## 2024-05-04 DIAGNOSIS — I5030 Unspecified diastolic (congestive) heart failure: Secondary | ICD-10-CM | POA: Diagnosis not present

## 2024-05-04 DIAGNOSIS — K219 Gastro-esophageal reflux disease without esophagitis: Secondary | ICD-10-CM | POA: Diagnosis present

## 2024-05-04 DIAGNOSIS — E118 Type 2 diabetes mellitus with unspecified complications: Secondary | ICD-10-CM | POA: Diagnosis not present

## 2024-05-04 DIAGNOSIS — Z7189 Other specified counseling: Secondary | ICD-10-CM | POA: Diagnosis not present

## 2024-05-04 DIAGNOSIS — T380X5A Adverse effect of glucocorticoids and synthetic analogues, initial encounter: Secondary | ICD-10-CM | POA: Diagnosis present

## 2024-05-04 DIAGNOSIS — Z1152 Encounter for screening for COVID-19: Secondary | ICD-10-CM | POA: Diagnosis not present

## 2024-05-04 DIAGNOSIS — G4733 Obstructive sleep apnea (adult) (pediatric): Secondary | ICD-10-CM | POA: Diagnosis present

## 2024-05-04 DIAGNOSIS — Z794 Long term (current) use of insulin: Secondary | ICD-10-CM | POA: Diagnosis not present

## 2024-05-04 DIAGNOSIS — R4589 Other symptoms and signs involving emotional state: Secondary | ICD-10-CM

## 2024-05-04 LAB — BASIC METABOLIC PANEL WITH GFR
Anion gap: 15 (ref 5–15)
BUN: 27 mg/dL — ABNORMAL HIGH (ref 8–23)
CO2: 30 mmol/L (ref 22–32)
Calcium: 10 mg/dL (ref 8.9–10.3)
Chloride: 95 mmol/L — ABNORMAL LOW (ref 98–111)
Creatinine, Ser: 1.17 mg/dL (ref 0.61–1.24)
GFR, Estimated: >60 mL/min (ref >60)
Glucose, Bld: 294 mg/dL — ABNORMAL HIGH (ref 70–99)
Potassium: 4.7 mmol/L (ref 3.5–5.1)
Sodium: 140 mmol/L (ref 135–145)

## 2024-05-04 LAB — ECHOCARDIOGRAM COMPLETE
AR max vel: 2.49 cm2
AV Area VTI: 2.55 cm2
AV Area mean vel: 2.22 cm2
AV Mean grad: 3 mmHg
AV Peak grad: 5.4 mmHg
Ao pk vel: 1.16 m/s
Area-P 1/2: 2.08 cm2
Height: 68 in
S' Lateral: 1.8 cm
Weight: 3680 [oz_av]

## 2024-05-04 LAB — D-DIMER, QUANTITATIVE: D-Dimer, Quant: 0.96 ug{FEU}/mL — ABNORMAL HIGH (ref 0.00–0.50)

## 2024-05-04 LAB — GLUCOSE, CAPILLARY
Glucose-Capillary: 248 mg/dL — ABNORMAL HIGH (ref 70–99)
Glucose-Capillary: 246 mg/dL — ABNORMAL HIGH (ref 70–99)
Glucose-Capillary: 438 mg/dL — ABNORMAL HIGH (ref 70–99)
Glucose-Capillary: 340 mg/dL — ABNORMAL HIGH (ref 70–99)

## 2024-05-04 LAB — CBC
HCT: 48.2 % (ref 39.0–52.0)
Hemoglobin: 15 g/dL (ref 13.0–17.0)
MCH: 25.9 pg — ABNORMAL LOW (ref 26.0–34.0)
MCHC: 31.1 g/dL (ref 30.0–36.0)
MCV: 83.2 fL (ref 80.0–100.0)
Platelets: 290 10*3/uL (ref 150–400)
RBC: 5.79 MIL/uL (ref 4.22–5.81)
RDW: 15.8 % — ABNORMAL HIGH (ref 11.5–15.5)
WBC: 14.3 10*3/uL — ABNORMAL HIGH (ref 4.0–10.5)
nRBC: 0 % (ref 0.0–0.2)

## 2024-05-04 LAB — GLUCOSE, RANDOM: Glucose, Bld: 465 mg/dL — ABNORMAL HIGH (ref 70–99)

## 2024-05-04 LAB — MAGNESIUM: Magnesium: 2.4 mg/dL (ref 1.7–2.4)

## 2024-05-04 LAB — PROCALCITONIN: Procalcitonin: 0.1 ng/mL

## 2024-05-04 LAB — PHOSPHORUS: Phosphorus: 5 mg/dL — ABNORMAL HIGH (ref 2.5–4.6)

## 2024-05-04 MED ORDER — OXYCODONE HCL 5 MG PO TABS
2.5000 mg | ORAL_TABLET | ORAL | Status: DC | PRN
  Administered 2024-05-04 – 2024-05-07 (×5): 2.5 mg via ORAL
  Filled 2024-05-04 (×5): qty 1

## 2024-05-04 MED ORDER — FUROSEMIDE 10 MG/ML IJ SOLN
40.0000 mg | Freq: Every day | INTRAMUSCULAR | Status: DC
  Administered 2024-05-04 – 2024-05-05 (×2): 40 mg via INTRAVENOUS
  Filled 2024-05-04 (×3): qty 4

## 2024-05-04 MED ORDER — INSULIN ASPART 100 UNIT/ML IJ SOLN
10.0000 [IU] | Freq: Three times a day (TID) | INTRAMUSCULAR | Status: DC
  Administered 2024-05-04 – 2024-05-05 (×3): 10 [IU] via SUBCUTANEOUS

## 2024-05-04 MED ORDER — INSULIN GLARGINE-YFGN 100 UNIT/ML ~~LOC~~ SOLN
30.0000 [IU] | Freq: Every day | SUBCUTANEOUS | Status: DC
  Administered 2024-05-04: 30 [IU] via SUBCUTANEOUS
  Filled 2024-05-04 (×2): qty 0.3

## 2024-05-04 MED ORDER — LIDOCAINE HCL 1 % IJ SOLN
INTRAMUSCULAR | Status: AC
Start: 2024-05-04 — End: 2024-05-04
  Filled 2024-05-04: qty 20

## 2024-05-04 MED ORDER — IOHEXOL 350 MG/ML SOLN
75.0000 mL | Freq: Once | INTRAVENOUS | Status: AC | PRN
  Administered 2024-05-04: 75 mL via INTRAVENOUS

## 2024-05-04 NOTE — Progress Notes (Addendum)
 PROGRESS NOTE    Jose Patrick  ZOX:[id number] DOB: [birth year] DOA: 05/03/2024 PCP: [name]    Brief Narrative:  Jose Patrick is a 73 y.o. 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 He returns day after discharge with sudden worsening in his breathing. Reports was only using 2L O2 at home despite being Rx 4L at discharge. Ultimately due to symptoms increased to 2L -> 4L -> 6L. While at rest had significant SOB and associated chest pressure. Feels like his lungs are being compressed. He is having thick tenacious sputum described like white glue. And also scant hemoptysis (very small amount of blood tinged sputum at bedside reports similar to that at home).     Assessment and Plan: Acute on chronic hypoxic respiratory failure, stable from past admission 4L O2  Stage IV NSCLC -seen by Dr. Liam Redhead last admission and recommended an outpatient PET scan which has not yet to be done Bilateral pleural effusion L > R, symptomatic  Hx recent diagnosis Stage IV NSCLC with Bronchoscopy and Bx 6/5 consistent with NSCLC throughout the R lung; pathology consistent with adenocarcinoma. Additional lung nodules noted in the L lung. Bilateral effusion also suspect metastatic. Overall suspect he is symptomatic from underlying cancer, with additional contributors from pleural effusion on the R, and his underlying COPD. Only other factor may be slight hypervolemia which we will treat as below. Did have recent rhinovirus pneumonia 6/2, but otherwise evaluation for bacterial pneumonia  unrevealing; BAL Culture 6/5 no growth. Although having hemoptysis this is scant and doubt contributing. Remains on 4L O2 with stable O2 sat. CXR included above with increased bilateral airspace disease R > L.  -Hold off on initiation of antibiotics as does not appear consistent with infection -S/p TXA neb, currently not requiring additional treatments  -s/p methylprednisolone , continue 60 mg IV every 12 hours for now.  -IR eval for thoracentesis-no significant fluid for tap -Pulmonary toilet: Continue home Wixela equivalent, Spiriva , DuoNeb every 6 hours, albuterol  every 4 hours as needed, I-S, flutter valve, out of bed to chair, guaifenesin  to thin secretions -Palliative care evaluation for symptomatic management of dyspnea, and to assist with goals of care; see discussion below  - CTA to rule out PE -Consider getting pulmonary back involved after CTA   HFpEF, with mild exacerbation  Mild Hypervolemia  Last TTE 9/'24 with LVEF 55-60%, mild cLVH, grade 1 diastolic dysfunction. Despite relative low BNP has increased and may be low related to weight. Has significant lower extremity edema. Although main contributors related to above, will continue with gentle diuresis.  -S/p Lasix  60 mg IV x 1 in the ED -Daily IV Lasix -monitor creatinine -Repeat echo -Daily weights, I's and O's   DM type II with hyperglycemia, I/s/o steroids  -Insulin  -Add SSI for moderate -Holding home oral agents    Goals of care  - Palliative care consult ordered, appreciate assistance with symptomatic management and goals of care. -Currently DNR   Chronic medical problems:  OSA: Was not compliant with his CPAP at home CAD, chronic angina / HLD: Aspirin  had been discontinued last admission.  Continue home atorvastatin , Zetia ,  metoprolol , ranolazine .  HTN: Continue home Metoprolol . Hold home Losartan  as BP reasonably controlled  GERD: continue home famotidine , sub home PPI  BPH: continue home finasteride   Bipolar  d/o, PTSD: noted not on treatment    DVT prophylaxis: SCDs Start: 05/03/24 1948    Code Status: Limited: Do not attempt resuscitation (DNR) -DNR-LIMITED -Do Not Intubate/DNI    Disposition Plan:  Level of care: Telemetry Status is: Inpatient     Consultants:  Palliative care     Subjective: Continues to be short of breath and developed some hemoptysis this morning  Objective: Vitals:   05/04/24 0501 05/04/24 0712 05/04/24 0827 05/04/24 0927  BP: 134/85  134/73   Pulse: 71  73   Resp: 16  16   Temp: (!) 97.4 F (36.3 C)  97.9 F (36.6 C)   TempSrc: Oral  Oral   SpO2: 92%  (!) 89% 91%  Weight:  104.3 kg    Height:        Intake/Output Summary (Last 24 hours) at 05/04/2024 1318 Last data filed at 05/03/2024 2159 Gross per 24 hour  Intake --  Output 700 ml  Net -700 ml   Filed Weights   05/03/24 1617 05/04/24 0712  Weight: 89.5 kg 104.3 kg    Examination:   General: Appearance:    Obese male in no acute distress.  On nasal cannula     Lungs:   Diminished  Heart:    Normal heart rate. Normal rhythm. No murmurs, rubs, or gallops.    MS:   All extremities are intact.    Neurologic:   Awake, alert       Data Reviewed: I have personally reviewed following labs and imaging studies  CBC: Recent Labs  Lab 04/29/24 0549 05/01/24 0656 05/03/24 1641 05/03/24 1648 05/04/24 0621  WBC 9.2 12.0* 14.3*  --  14.3*  NEUTROABS  --   --  12.7*  --   --   HGB 15.1 15.4 14.7 16.7 15.0  HCT 48.2 49.5 46.9 49.0 48.2  MCV 83.7 83.6 83.6  --  83.2  PLT 282 275 274  --  290   Basic Metabolic Panel: Recent Labs  Lab 04/29/24 0549 05/01/24 0656 05/03/24 1648 05/03/24 1725 05/04/24 0621  NA 137 137 134* 134* 140  K 4.3 5.0 4.7 4.6 4.7  CL 98 95* 95* 95* 95*  CO2 31 32  --  27 30  GLUCOSE 115* 135* 404* 383* 294*  BUN 18 17 26* 22 27*  CREATININE 0.86 0.85 1.00 1.12 1.17  CALCIUM  9.4 9.4  --  9.2 10.0  MG  --   --   --   --  2.4  PHOS  --   --   --   --   5.0*   GFR: Estimated Creatinine Clearance: 66.8 mL/min (by C-G formula based on SCr of 1.17 mg/dL). Liver Function Tests: Recent Labs  Lab 05/03/24 1725  AST 25  ALT 58*  ALKPHOS 58  BILITOT 0.5  PROT 5.7*  ALBUMIN 2.5*   No results for input(s): LIPASE, AMYLASE in the last 168 hours. No results for input(s): AMMONIA in the last 168 hours. Coagulation Profile: No results for input(s): INR, PROTIME in the last 168 hours. Cardiac Enzymes: No results for input(s): CKTOTAL, CKMB, CKMBINDEX, TROPONINI in the last 168 hours. BNP (last 3 results) No results for input(s): PROBNP in the last 8760 hours. HbA1C: No results for input(s): HGBA1C in the last 72 hours. CBG: Recent Labs  Lab 05/03/24 1618 05/03/24 2015 05/03/24 2059 05/04/24 0755 05/04/24 1155  GLUCAP 370* 318* 289* 248* 246*   Lipid Profile: No results for input(s): CHOL, HDL, LDLCALC, TRIG, CHOLHDL, LDLDIRECT in the last 72 hours. Thyroid Function Tests: No results for input(s): TSH, T4TOTAL, FREET4, T3FREE, THYROIDAB in the last 72 hours. Anemia Panel: No results for input(s): VITAMINB12, FOLATE, FERRITIN, TIBC, IRON, RETICCTPCT in the last 72 hours. Sepsis Labs: Recent Labs  Lab 05/04/24 1610  PROCALCITON 0.10    Recent Results (from the past 240 hours)  Resp panel by RT-PCR (RSV, Flu A&B, Covid) Anterior Nasal Swab     Status: None   Collection Time: 05/03/24  4:41 PM   Specimen: Anterior Nasal Swab  Result Value Ref Range Status   SARS Coronavirus 2 by RT PCR NEGATIVE NEGATIVE Final    Comment: (NOTE) SARS-CoV-2 target nucleic acids are NOT DETECTED.  The SARS-CoV-2 RNA is generally detectable in upper respiratory specimens during the acute phase of infection. The lowest concentration of SARS-CoV-2 viral copies this assay can detect is 138 copies/mL. A negative result does not preclude SARS-Cov-2 infection and should not be used as the sole  basis for treatment or other patient management decisions. A negative result may occur with  improper specimen collection/handling, submission of specimen other than nasopharyngeal swab, presence of viral mutation(s) within the areas targeted by this assay, and inadequate number of viral copies(<138 copies/mL). A negative result must be combined with clinical observations, patient history, and epidemiological information. The expected result is Negative.  Fact Sheet for Patients:  BloggerCourse.com  Fact Sheet for Healthcare Providers:  SeriousBroker.it  This test is no t yet approved or cleared by the [place]  FDA and  has been authorized for detection and/or diagnosis of SARS-CoV-2 by FDA under an Emergency Use Authorization (EUA). This EUA will remain  in effect (meaning this test can be used) for the duration of the COVID-19 declaration under Section 564(b)(1) of the Act, 21 U.S.C.section 360bbb-3(b)(1), unless the authorization is terminated  or revoked sooner.       Influenza A by PCR NEGATIVE NEGATIVE Final   Influenza B by PCR NEGATIVE NEGATIVE Final    Comment: (NOTE) The Xpert Xpress SARS-CoV-2/FLU/RSV plus assay is intended as an aid in the diagnosis of influenza from Nasopharyngeal swab specimens and should not be used as a sole basis for treatment. Nasal washings and aspirates are unacceptable for Xpert Xpress SARS-CoV-2/FLU/RSV testing.  Fact Sheet for Patients: BloggerCourse.com  Fact Sheet for Healthcare Providers: SeriousBroker.it  This test is not yet approved or cleared by the [place]  FDA and has been authorized for detection and/or diagnosis of SARS-CoV-2 by FDA under an Emergency Use Authorization (EUA). This EUA will remain in effect (meaning this test can be used) for the duration of the COVID-19 declaration under Section 564(b)(1) of the Act,  21 U.S.C. section 360bbb-3(b)(1), unless the authorization is terminated or revoked.     Resp Syncytial Virus by PCR NEGATIVE NEGATIVE Final    Comment: (NOTE) Fact Sheet for Patients: BloggerCourse.com  Fact Sheet for Healthcare Providers: SeriousBroker.it  This test is not yet approved or cleared by the [place]  FDA and has been authorized for detection and/or diagnosis of SARS-CoV-2 by FDA under an Emergency Use Authorization (EUA). This EUA will remain in effect (meaning this test can be used) for the duration of the COVID-19 declaration under Section 564(b)(1) of the Act, 21 U.S.C. section 360bbb-3(b)(1), unless the authorization is terminated or revoked.  Performed  at [facility], [address], [place], [place] [address]          Radiology Studies: DG Chest Portable 1 View Result Date: 05/03/2024 CLINICAL DATA:  Stage IV lung cancer, diabetes, productive cough after bronchoscopy yesterday EXAM: PORTABLE CHEST 1 VIEW COMPARISON:  04/25/2024 FINDINGS: Single frontal view of the chest demonstrates a stable cardiac silhouette. There is widespread bilateral airspace disease, right greater than left, increased since prior study. Underlying effusions are suspected, right greater than left. No pneumothorax. No acute bony abnormalities. IMPRESSION: 1. Progressive widespread bilateral airspace disease, right greater than left, which could reflect worsening infection, edema, or hemorrhage. 2. Bilateral pleural effusions, right greater than left. Electronically Signed   By: Bobbye Burrow M.D.   On: 05/03/2024 17:33        Scheduled Meds:  artificial tears  1 drop Both Eyes Daily   atorvastatin   40 mg Oral QPM   cycloSPORINE  1 drop Both Eyes BID   ezetimibe   10 mg Oral Daily   famotidine   20 mg Oral QHS   finasteride   5 mg Oral Daily   fluticasone  furoate-vilanterol  1 puff Inhalation Daily    furosemide   40 mg Intravenous Daily   gabapentin   300 mg Oral BID   guaiFENesin   1,200 mg Oral BID   insulin  aspart  0-15 Units Subcutaneous TID WC   insulin  glargine-yfgn  20 Units Subcutaneous QHS   ipratropium-albuterol   3 mL Nebulization QID   methylPREDNISolone  (SOLU-MEDROL ) injection  60 mg Intravenous Q12H   metoprolol  succinate  100 mg Oral Daily   pantoprazole   40 mg Oral QAC breakfast   ranolazine   500 mg Oral BID   umeclidinium bromide   1 puff Inhalation Daily   Continuous Infusions:   LOS: 0 days    Time spent: 45 minutes spent on chart review, discussion with nursing staff, consultants, updating family and interview/physical exam; more than 50% of that time was spent in counseling and/or coordination of care.    Enrigue Harvard, DO [facility] Available via Epic secure chat 7am-7pm After these hours, please refer to coverage provider listed on [email or web address] 05/04/2024, 1:18 PM

## 2024-05-04 NOTE — Plan of Care (Signed)
  Problem: Coping: Goal: Ability to adjust to condition or change in health will improve Outcome: Progressing   Problem: Fluid Volume: Goal: Ability to maintain a balanced intake and output will improve Outcome: Progressing   Problem: Nutritional: Goal: Maintenance of adequate nutrition will improve Outcome: Progressing   Problem: Skin Integrity: Goal: Risk for impaired skin integrity will decrease Outcome: Progressing   Problem: Education: Goal: Knowledge of General Education information will improve Description: Including pain rating scale, medication(s)/side effects and non-pharmacologic comfort measures Outcome: Progressing   Problem: Nutrition: Goal: Adequate nutrition will be maintained Outcome: Progressing   Problem: Coping: Goal: Level of anxiety will decrease Outcome: Progressing   Problem: Skin Integrity: Goal: Risk for impaired skin integrity will decrease Outcome: Progressing

## 2024-05-04 NOTE — Consult Note (Signed)
 Consultation Note Date: 05/04/2024   Patient Name: Jose Patrick  DOB: Dec 06, 1950  MRN: [id number]  Age / Sex: 73 y.o., male   PCP: [name] Referring Physician: Enrigue Harvard, DO  Reason for Consultation: Establishing goals of care and Non pain symptom management     Chief Complaint/History of Present Illness:   Patient 73 year old male with past medical history of recently diagnosed stage IV non-small cell lung cancer with associated pleural effusion suspected to be malignant, COPD, OSA on CPAP at bedtime, CAD, hypertension, diabetes, GERD, BPH, mood disorder, and HFpEF who was admitted on 05/03/2024 for management of worsening shortness of breath and chest pressure.  Patient has had multiple hospitalizations recently for management of pneumonia and acute on chronic hypoxic respiratory failure.  Since admission, patient has received management for acute on chronic hypoxic respiratory failure and mild exacerbation of HFpEF.  Palliative medicine team consulted to assist with complex medical decision making and symptom management.  Extensive review of EMR prior to presenting to bedside.  Reviewed recent notes from patient's multiple hospitalizations.  Reviewed recent hospitalist and radiology note.  Patient was evaluated by IR for possible thoracentesis for management of pleural effusion though was noted that there was not enough fluid to proceed with thoracentesis at this time.  Reviewed recent CMP noting BUN 27, creatinine 1.17, GFR >60, and albumin 2.5.  Patient's WBCs elevated at 14.3.  Patient did have CT a to rule out possible pulmonary embolism.  Discussed care with RN for updates.  Presented to bedside to meet with patient.  Patient sitting on bench at bedside next to window.  Able to introduce myself as a member of the palliative medicine team my role in patient's medical journey.  Spent time learning about patient's multiple hospitalizations and medical illness.   Patient acknowledges he was recently diagnosed with stage IV lung cancer.  Patient hoping to follow-up with Dr. Marguerita Shih in the outpatient setting for further workup and recommendations regarding cancer related therapies.  Patient expressed difficulty with recurrent hospitalizations preventing him from following up to do this.  With permission, spent time learning about patient's life outside of the hospital.  Patient notes that he greatly enjoys his time at home.  Patient's favorite place this is from porch in his rocker enjoying the beautiful scenery around his home.  Patient notes he lives at home with his wife.  Patient also has a daughter.  Patient enjoys spending time with his family.  Patient discussed difficulties at home when he becomes suddenly short of breath and his petite wife has no support but to call 911 and him to return to the hospital.  Spent time providing emotional support via active listening and acknowledged difficulties with situation.  Spent time learning about patient's hopes for medical care moving forward.  Patient does know he has metastatic cancer.  Patient wants opportunity to discuss with oncology in the outpatient setting about the appropriateness of cancer directed therapies.  Patient hoping to have improvement in his shortness of breath.  Discussed addition of opioids to assist with shortness of breath management.  Explained could start low-dose opioids and titrate based on patient's symptom relief.  Patient acknowledged this and agreed with plan to start low-dose oxycodone  to assist with his shortness of breath.  Discussed care planning moving forward.  Patient noted that his wife is his healthcare power of attorney.  He also wants his daughter involved in medical discussions.  With permission, also discussed patient's CODE STATUS.  Explained  full code versus DNR/DNI.  Expressed concern that if patient were sick enough his heart were to stop or he would stop breathing in the  setting of metastatic cancer, interventions such as cardiac resuscitation and intubation mechanical ventilation would not lead to quality of life outcomes that the patient has previously described.  Patient acknowledges and after explanation noted he would like to be DNR/DNI.  At that time, patient called his daughter and wife and placed him on speaker phone so we could again have this conversation.  Again spent time discussing patient's medical illness and how any cancer directed therapies if offered would be to allow time and not be curative.  Family acknowledged this.  Also discussed CODE STATUS and how patient would like to elect to be DNR/DNI.  Discussed this would mean he would continue appropriate medical therapies though would not be placed on life support which he would not want.  Wife and daughter acknowledge patient's decision regarding change of CODE STATUS to DNR/DNI.  While patient's wife and daughter were present on the phone, patient noted that he also wanted to consider what would happen when he was going to die because he would not want to die at home.  Discussed that if patient gets to the point where he is no longer appropriate for cancer directed therapies as per oncology recommendations, then would recommend adding in hospice support at home.  Discussed how hospice could allow patient to enjoy quality time at home for the time he would have.  Noted there could be further conversations with hospice that patient does not want to die in his own home.  Expressed that with patient's shortness of breath, would likely need IV medications for symptom management and could potentially be a candidate for inpatient hospice at end-of-life.  Patient acknowledges this and noted this sounded appropriate to what he would eventually want.  Noted would refer patient to outpatient palliative medicine provider at [facility] to assist with coordination of care moving forward based on conversations with oncologist.   Patient and family agreeing with this plan.  Patient's only other concern was that he get a wheelchair to be able to move around more and enjoy quality time at home.  Noted would reach out to [facility] regarding this.  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  Spent time answering questions as able.  Noted palliative medicine team continue to follow along with patient's medical journey.  Discussed care with IDT including hospitalist, RN, and TOC to coordinate care.  Primary Diagnoses  Present on Admission:  Metastatic lung cancer (metastasis from lung to other site) [facility])  COPD with acute exacerbation (HCC)  Acute on chronic respiratory failure with hypoxia (HCC)   Palliative Review of Systems: Shortness of breath  Past Medical History:  Diagnosis Date   ALLERGIC RHINITIS 10/08/2007   Qualifier: Diagnosis of  By: Autry Legions MD, Alveda Aures    BENIGN PROSTATIC HYPERTROPHY 09/28/2010   Qualifier: Diagnosis of  By: Autry Legions MD, Alveda Aures    Bipolar affective disorder (HCC) 09/30/2012   COLONIC POLYPS, HX OF 10/08/2007   Qualifier: Diagnosis of  By: Autry Legions MD, Alveda Aures    CONSTIPATION, CHRONIC, HX OF 08/10/2007   Qualifier: Diagnosis of  By: Jarold Merlin    DIABETES MELLITUS, TYPE II 10/08/2007   Qualifier: Diagnosis of  By: Autry Legions MD, Alveda Aures    ERECTILE DYSFUNCTION 10/08/2007   Qualifier: Diagnosis of  By: Autry Legions MD, Alveda Aures  HYPERLIPIDEMIA 10/08/2007   Qualifier: Diagnosis of  By: Autry Legions MD, Alveda Aures    HYPERTENSION 03/20/2009   Qualifier: Diagnosis of  By: Autry Legions MD, Alveda Aures    PTSD (post-traumatic stress disorder) 09/30/2012   SPINAL STENOSIS, LUMBAR 10/08/2007   Per Dr Larrie Po - [facility]   Qualifier: Diagnosis of  By: Autry Legions MD, Alveda Aures     Social History   Socioeconomic History   Marital status: Married    Spouse name: Not on file   Number of children: 4   Years of education: Not on file   Highest education level: Not on file  Occupational  History   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, 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  Tobacco Use   Smoking status: Former    Current packs/day: 0.00    Average packs/day: 1.5 packs/day for 41.0 years (61.5 ttl pk-yrs)    Types: Cigarettes    Start date: 69    Quit date: 2013    Years since quitting: 12.4   Smokeless tobacco: Never  Vaping Use   Vaping status: Never Used  Substance and Sexual Activity   Alcohol use: Yes   Drug use: No   Sexual activity: Not on file  Other Topics Concern   Not on file  Social History Narrative   Not on file   Social Drivers of Health   Financial Resource Strain: Low Risk  (12/02/2023)   [facility] from [facility]   Overall Financial Resource Strain (CARDIA)    Difficulty of Paying Living Expenses: Not hard at all  Food Insecurity: No Food Insecurity (05/04/2024)   Hunger Vital Sign    Worried About Running Out of Food in the Last Year: Never true    Ran Out of Food in the Last Year: Never true  Transportation Needs: No Transportation Needs (05/04/2024)   PRAPARE - Administrator, Civil Service (Medical): No    Lack of Transportation (Non-Medical): No  Physical Activity: Unknown (03/17/2024)   Received from [facility]   PCARE Exercise SDOH    Exercise: Disabled    PCare Exercise SDOH: Not on file    PCare Exercise SDOH: Not on file  Stress: Stress Concern Present (03/08/2024)   Received from [facility] - Occupational Stress Questionnaire    Feeling of Stress : Rather much  Social Connections: Moderately Integrated (05/04/2024)   Social Connection and Isolation Panel    Frequency of Communication with Friends and Family: Once a week    Frequency of Social Gatherings with Friends and Family: Once a week    Attends Religious Services: More than 4 times per year    Active Member of [employer] or Organizations: Yes    Attends Museum/gallery exhibitions officer: More than 4 times per year    Marital Status: Married   Family History  Problem Relation Age of Onset   Cancer Mother    Ulcers Mother        Stomach   Depression Mother    Anxiety disorder Mother    Allergies Sister    Scheduled Meds:  artificial tears  1 drop Both Eyes Daily   atorvastatin   40 mg Oral QPM   cycloSPORINE  1 drop Both Eyes BID   ezetimibe   10 mg Oral Daily   famotidine   20 mg Oral QHS   finasteride   5 mg Oral Daily  fluticasone  furoate-vilanterol  1 puff Inhalation Daily   furosemide   40 mg Intravenous Daily   gabapentin   300 mg Oral BID   guaiFENesin   1,200 mg Oral BID   insulin  aspart  0-15 Units Subcutaneous TID WC   insulin  glargine-yfgn  20 Units Subcutaneous QHS   ipratropium-albuterol   3 mL Nebulization QID   methylPREDNISolone  (SOLU-MEDROL ) injection  60 mg Intravenous Q12H   metoprolol  succinate  100 mg Oral Daily   pantoprazole   40 mg Oral QAC breakfast   ranolazine   500 mg Oral BID   umeclidinium bromide   1 puff Inhalation Daily   Continuous Infusions: PRN Meds:.acetaminophen , albuterol , melatonin, ondansetron  (ZOFRAN ) IV, polyethylene glycol Allergies  Allergen Reactions   Chlorzoxazone Swelling and Other (See Comments)    Psychosis  (Parafon Forte) ABSOLUTELY CANNOT HAVE THIS!!   Tramadol Other (See Comments)    Muscles Tightness    CBC:    Component Value Date/Time   WBC 14.3 (H) 05/04/2024 0621   HGB 15.0 05/04/2024 0621   HCT 48.2 05/04/2024 0621   PLT 290 05/04/2024 0621   MCV 83.2 05/04/2024 0621   NEUTROABS 12.7 (H) 05/03/2024 1641   LYMPHSABS 0.8 05/03/2024 1641   MONOABS 0.5 05/03/2024 1641   EOSABS 0.2 05/03/2024 1641   BASOSABS 0.0 05/03/2024 1641   Comprehensive Metabolic Panel:    Component Value Date/Time   NA 140 05/04/2024 0621   K 4.7 05/04/2024 0621   CL 95 (L) 05/04/2024 0621   CO2 30 05/04/2024 0621   BUN 27 (H) 05/04/2024 0621   CREATININE 1.17 05/04/2024 0621   GLUCOSE 294 (H)  05/04/2024 0621   CALCIUM  10.0 05/04/2024 0621   AST 25 05/03/2024 1725   ALT 58 (H) 05/03/2024 1725   ALKPHOS 58 05/03/2024 1725   BILITOT 0.5 05/03/2024 1725   BILITOT 0.3 10/06/2023 1202   PROT 5.7 (L) 05/03/2024 1725   PROT 6.7 10/06/2023 1202   ALBUMIN 2.5 (L) 05/03/2024 1725   ALBUMIN 4.3 10/06/2023 1202    Physical Exam: Vital Signs: BP 134/73 (BP Location: Right Arm)   Pulse 73   Temp 97.9 F (36.6 C) (Oral)   Resp 16   Ht 5' 8 (1.727 m)   Wt 104.3 kg   SpO2 91%   BMI 34.97 kg/m  SpO2: SpO2: 91 % O2 Device: O2 Device: Nasal Cannula O2 Flow Rate: O2 Flow Rate (L/min): 4 L/min Intake/output summary:  Intake/Output Summary (Last 24 hours) at 05/04/2024 1012 Last data filed at 05/03/2024 2159 Gross per 24 hour  Intake --  Output 700 ml  Net -700 ml   LBM: Last BM Date : 05/03/24 Baseline Weight: Weight: 89.5 kg Most recent weight: Weight: 104.3 kg  General: NAD, alert, pleasant, chronically ill-appearing Cardiovascular: RRR Respiratory: Slightly increased work of breathing noted, not in respiratory distress Neuro: A&Ox4, following commands easily Psych: appropriately answers all questions          Palliative Performance Scale: 50%              Additional Data Reviewed: Recent Labs    05/03/24 1641 05/03/24 1641 05/03/24 1648 05/03/24 1725 05/04/24 0621  WBC 14.3*  --   --   --  14.3*  HGB 14.7  --  16.7  --  15.0  PLT 274  --   --   --  290  NA  --    < > 134* 134* 140  BUN  --    < > 26* 22 27*  CREATININE  --    < >  1.00 1.12 1.17   < > = values in this interval not displayed.    Imaging: DG Chest Portable 1 View CLINICAL DATA:  Stage IV lung cancer, diabetes, productive cough after bronchoscopy yesterday  EXAM: PORTABLE CHEST 1 VIEW  COMPARISON:  04/25/2024  FINDINGS: Single frontal view of the chest demonstrates a stable cardiac silhouette. There is widespread bilateral airspace disease, right greater than left, increased since  prior study. Underlying effusions are suspected, right greater than left. No pneumothorax. No acute bony abnormalities.  IMPRESSION: 1. Progressive widespread bilateral airspace disease, right greater than left, which could reflect worsening infection, edema, or hemorrhage. 2. Bilateral pleural effusions, right greater than left.  Electronically Signed   By: Bobbye Burrow M.D.   On: 05/03/2024 17:33    I personally reviewed recent imaging.   Palliative Care Assessment and Plan Summary of Established Goals of Care and Medical Treatment Preferences   Patient 73 year old male with past medical history of recently diagnosed stage IV non-small cell lung cancer with associated pleural effusion suspected to be malignant, COPD, OSA on CPAP at bedtime, CAD, hypertension, diabetes, GERD, BPH, mood disorder, and HFpEF who was admitted on 05/03/2024 for management of worsening shortness of breath and chest pressure.  Patient has had multiple hospitalizations recently for management of pneumonia and acute on chronic hypoxic respiratory failure.  Since admission, patient has received management for acute on chronic hypoxic respiratory failure and mild exacerbation of HFpEF.  Palliative medicine team consulted to assist with complex medical decision making and symptom management.  # Complex medical decision making/goals of care  - Discussed care with patient as detailed above in HPI.  During conversation patient's wife and daughter were placed on speaker phone to engage in conversation as well.  Patient acknowledges he has stage IV non-small cell lung cancer.  Patient hoping to follow-up with oncology in the outpatient setting to determine if there are cancer directed therapies available to assist with allowing him more time.  Patient notes that his quality time is spent at home particularly on his front porch in his rocker.  Patient is hopeful for more time of this.  Patient notes his family is incredibly  important to his life and he would like more time with them.  Acknowledged this.  - Patient noting that when his time is short, he does not want to die at home.  Discussed involvement of hospice when patient is no longer appropriate for cancer directed therapies and initiating these services through hospice originally at home.  Discussed if patient having worsening symptom management and needing appropriate medications, could coordinate with hospice possibility of inpatient hospice so that he could die at a hospice facility.  Noted at this time, would refer to palliative medicine team at [facility] to assist with care coordination.  -  Code Status: Limited: Do not attempt resuscitation (DNR) -DNR-LIMITED -Do Not Intubate/DNI     - Discussed CODE STATUS with patient and his wife and daughter on speaker phone as detailed above in HPI.  Explained full code versus DNR/DNI.  Expressed concern that if patient were to be sick enough his heart were to stop or he would stop breathing, interventions such as intubation and cardiac resuscitation would not lead to quality of life outcomes patient has described.  Patient agreeing with that and would like to change his CODE STATUS to DNR/DNI.  Wife and daughter supporting of this.  # Symptom management Patient is receiving these palliative interventions for symptom management with an  intent to improve quality of life.   - Shortness of breath, in setting of stage IV non-small cell lung cancer and COPD   - Start oxycodone  2.5 mg every 4 hours as needed.  Will continue to adjust based on patient's symptom response.  # Psycho-social/Spiritual Support:  - Support System: Wife, daughter  # Discharge Planning:  To Be Determined  Thank you for allowing the palliative care team to participate in the care [name].  [name], DO Palliative Care Provider PMT # [phone]  If patient remains symptomatic despite maximum doses, please call PMT at [phone]  between 0700 and 1900. Outside of these hours, please call attending, as PMT does not have night coverage.  Personally spent 90 minutes in patient care including extensive chart review (labs, imaging, progress/consult notes, vital signs), medically appropraite exam, discussed with treatment team, education to patient, family, and staff, documenting clinical information, medication review and management, coordination of care, and available advanced directive documents.

## 2024-05-04 NOTE — Progress Notes (Signed)
   05/04/24 0014  BiPAP/CPAP/SIPAP  $ Non-Invasive Ventilator  Non-Invasive Vent Initial  $ Face Mask Large  Yes  BiPAP/CPAP/SIPAP Pt Type Adult  BiPAP/CPAP/SIPAP DREAMSTATIOND  Mask Type Full face mask  Dentures removed? Not applicable  Mask Size Large  Respiratory Rate 18 breaths/min  FiO2 (%)  (4l bled in)  Patient Home Machine No  Patient Home Mask No  Patient Home Tubing No  Auto Titrate Yes  Minimum cmH2O 20 cmH2O  Maximum cmH2O 5 cmH2O  CPAP/SIPAP surface wiped down Yes  Device Plugged into RED Power Outlet Yes  Oxygen Percent  (4l bled in)  BiPAP/CPAP /SiPAP Vitals  Pulse Rate 78  Resp (!) 22  SpO2 97 %  Bilateral Breath Sounds Clear

## 2024-05-04 NOTE — Inpatient Diabetes Management (Incomplete)
 Inpatient Diabetes Program Recommendations  AACE/ADA: New Consensus Statement on Inpatient Glycemic Control (2015)  Target Ranges:  Prepandial:   less than 140 mg/dL      Peak postprandial:   less than 180 mg/dL (1-2 hours)      Critically ill patients:  140 - 180 mg/dL   Lab Results  Component Value Date   GLUCAP 248 (H) 05/04/2024   HGBA1C 7.8 (H) 04/03/2024    Review of Glycemic Control  Diabetes history: DM2 Outpatient Diabetes medications: Lantus   Current orders for Inpatient glycemic control: ***  Inpatient Diabetes Program Recommendations:

## 2024-05-04 NOTE — Procedures (Signed)
 I was present during limited ultrasound of the left and right chest prior to possible thoracentesis with increased attention to right chest based on CXR findings 05/03/24 that R>L bilateral pleural effusions. Patient was positioned sitting up on edge of bed.    Right pleural effusion was small. There was no significant pocket of fluid to allow for safe approach for thoracentesis.  Discussed with patient who understands and agrees with plan to hold procedure.  Order may be placed again for patient to return to US  if felt that effusion has increased.   Mykal Batiz NP 05/04/2024 10:15 AM

## 2024-05-05 ENCOUNTER — Encounter (HOSPITAL_COMMUNITY): Payer: Self-pay | Admitting: Internal Medicine

## 2024-05-05 DIAGNOSIS — J9621 Acute and chronic respiratory failure with hypoxia: Secondary | ICD-10-CM | POA: Diagnosis not present

## 2024-05-05 DIAGNOSIS — Z515 Encounter for palliative care: Secondary | ICD-10-CM | POA: Diagnosis not present

## 2024-05-05 DIAGNOSIS — J441 Chronic obstructive pulmonary disease with (acute) exacerbation: Secondary | ICD-10-CM | POA: Diagnosis not present

## 2024-05-05 DIAGNOSIS — G4733 Obstructive sleep apnea (adult) (pediatric): Secondary | ICD-10-CM | POA: Diagnosis not present

## 2024-05-05 DIAGNOSIS — C3490 Malignant neoplasm of unspecified part of unspecified bronchus or lung: Secondary | ICD-10-CM | POA: Diagnosis not present

## 2024-05-05 DIAGNOSIS — C3491 Malignant neoplasm of unspecified part of right bronchus or lung: Secondary | ICD-10-CM

## 2024-05-05 DIAGNOSIS — R042 Hemoptysis: Secondary | ICD-10-CM | POA: Diagnosis not present

## 2024-05-05 DIAGNOSIS — J449 Chronic obstructive pulmonary disease, unspecified: Secondary | ICD-10-CM | POA: Diagnosis not present

## 2024-05-05 LAB — BASIC METABOLIC PANEL WITH GFR
Anion gap: 13 (ref 5–15)
BUN: 38 mg/dL — ABNORMAL HIGH (ref 8–23)
CO2: 28 mmol/L (ref 22–32)
Calcium: 9.4 mg/dL (ref 8.9–10.3)
Chloride: 95 mmol/L — ABNORMAL LOW (ref 98–111)
Creatinine, Ser: 1.14 mg/dL (ref 0.61–1.24)
GFR, Estimated: >60 mL/min (ref >60)
Glucose, Bld: 273 mg/dL — ABNORMAL HIGH (ref 70–99)
Potassium: 4.9 mmol/L (ref 3.5–5.1)
Sodium: 136 mmol/L (ref 135–145)

## 2024-05-05 LAB — CBC
HCT: 45.8 % (ref 39.0–52.0)
Hemoglobin: 14.4 g/dL (ref 13.0–17.0)
MCH: 26.2 pg (ref 26.0–34.0)
MCHC: 31.4 g/dL (ref 30.0–36.0)
MCV: 83.3 fL (ref 80.0–100.0)
Platelets: 265 10*3/uL (ref 150–400)
RBC: 5.5 MIL/uL (ref 4.22–5.81)
RDW: 15.6 % — ABNORMAL HIGH (ref 11.5–15.5)
WBC: 18.5 10*3/uL — ABNORMAL HIGH (ref 4.0–10.5)
nRBC: 0 % (ref 0.0–0.2)

## 2024-05-05 LAB — GLUCOSE, CAPILLARY
Glucose-Capillary: 275 mg/dL — ABNORMAL HIGH (ref 70–99)
Glucose-Capillary: 265 mg/dL — ABNORMAL HIGH (ref 70–99)
Glucose-Capillary: 224 mg/dL — ABNORMAL HIGH (ref 70–99)
Glucose-Capillary: 249 mg/dL — ABNORMAL HIGH (ref 70–99)

## 2024-05-05 MED ORDER — REVEFENACIN 175 MCG/3ML IN SOLN
175.0000 ug | Freq: Every day | RESPIRATORY_TRACT | Status: DC
  Administered 2024-05-07 – 2024-05-08 (×2): 175 ug via RESPIRATORY_TRACT
  Filled 2024-05-05 (×3): qty 3

## 2024-05-05 MED ORDER — GABAPENTIN 400 MG PO CAPS
400.0000 mg | ORAL_CAPSULE | Freq: Three times a day (TID) | ORAL | Status: DC
  Administered 2024-05-05 – 2024-05-08 (×6): 400 mg via ORAL
  Filled 2024-05-05 (×9): qty 1

## 2024-05-05 MED ORDER — GABAPENTIN 400 MG PO CAPS: 400.0000 mg | ORAL_CAPSULE | Freq: Two times a day (BID) | ORAL | Status: DC

## 2024-05-05 MED ORDER — BUDESONIDE 0.25 MG/2ML IN SUSP
0.2500 mg | Freq: Two times a day (BID) | RESPIRATORY_TRACT | Status: DC
  Administered 2024-05-05 – 2024-05-07 (×2): 0.25 mg via RESPIRATORY_TRACT
  Filled 2024-05-05 (×4): qty 2

## 2024-05-05 MED ORDER — INSULIN GLARGINE-YFGN 100 UNIT/ML ~~LOC~~ SOLN
35.0000 [IU] | Freq: Every day | SUBCUTANEOUS | Status: DC
  Administered 2024-05-05: 35 [IU] via SUBCUTANEOUS
  Filled 2024-05-05 (×3): qty 0.35

## 2024-05-05 MED ORDER — INSULIN ASPART 100 UNIT/ML IJ SOLN
12.0000 [IU] | Freq: Three times a day (TID) | INTRAMUSCULAR | Status: DC
  Administered 2024-05-05 – 2024-05-08 (×6): 12 [IU] via SUBCUTANEOUS

## 2024-05-05 MED ORDER — ARFORMOTEROL TARTRATE 15 MCG/2ML IN NEBU
15.0000 ug | INHALATION_SOLUTION | Freq: Two times a day (BID) | RESPIRATORY_TRACT | Status: DC
  Administered 2024-05-05 – 2024-05-08 (×4): 15 ug via RESPIRATORY_TRACT
  Filled 2024-05-05 (×6): qty 2

## 2024-05-05 NOTE — Progress Notes (Signed)
 Mobility Specialist - Progress Note   05/05/24 1009  Oxygen Therapy  SpO2 (!) 88 %  O2 Device Nasal Cannula  O2 Flow Rate (L/min) 6 L/min  Patient Activity (if Appropriate) Ambulating  Mobility  Activity Ambulated with assistance in hallway  Level of Assistance Modified independent, requires aide device or extra time  Assistive Device Other (Comment) (IV Pole)  Distance Ambulated (ft) 40 ft  Activity Response Tolerated well  Mobility Referral Yes  Mobility visit 1 Mobility  Mobility Specialist Start Time (ACUTE ONLY) [id number]  Mobility Specialist Stop Time (ACUTE ONLY) 1008  Mobility Specialist Time Calculation (min) (ACUTE ONLY) 10 min   Pt received in EOB and agreeable to mobility. Distance limited d/t pt not being able to maintain SpO2 above 88%. No complaints during session. Pt to bench after session with all needs met.    Pre-mobility: 92% SpO2 (5L [place]) During mobility: 88-91% SpO2 (6L [place]) Post-mobility: 91% SPO2 (6L [place])  Chief Technology Officer

## 2024-05-05 NOTE — Progress Notes (Signed)
   05/05/24 0000  BiPAP/CPAP/SIPAP  BiPAP/CPAP/SIPAP DREAMSTATIOND  Mask Type Full face mask  Dentures removed? Not applicable  Mask Size Large  Respiratory Rate 18 breaths/min  Flow Rate 5 lpm  Patient Home Machine No  Patient Home Mask No  Patient Home Tubing No  Auto Titrate Yes  Minimum cmH2O 5 cmH2O  Maximum cmH2O 20 cmH2O  Device Plugged into RED Power Outlet Yes

## 2024-05-05 NOTE — Progress Notes (Signed)
 Daily Progress Note   Patient Name: Gladys Gutman       Date: 05/05/2024 DOB: 1951-06-13  Age: 73 y.o. MRN#: [id number] Attending Physician: Loma Rising, MD Primary Care Physician: [name] Admit Date: 05/03/2024 Length of Stay: 1 day  Reason for Consultation/Follow-up: Establishing goals of care  Subjective:   CC: Patient sitting up on side of bed noting sensation of shortness of breath.  Following up regarding complex medical decision making and symptom management.  Subjective:  Reviewed EMR prior to presenting to bedside.  At time of EMR review in past 24 hours patient has received as needed oxycodone  2.5 mg x 1 dose.  Reviewed recent documentation from hospitalist and mobility specialist.  Patient had CTA noting increase in metastatic disease burden versus fungal versus atypical infection.  Oncology and pulmonology consulted for recommendations.  Presented to bedside to see patient.  Patient sitting up on side of bed.  Patient remembered speaking to this provider yesterday.  Patient noted he still has sensation of shortness of breath.  Inquired about patient's as needed oxycodone  use yesterday to assist with this.  Patient noted that he did feel the oxycodone  improved his breathing.  Inquired why patient has not requested more oxycodone  and patient noted he was unsure if he could do this.  I again reviewed with patient that he can request as needed oxycodone  to assist with shortness of breath every 4 hours as needed.  Patient noted he would continue to do this as he does feel it helps with his breathing. Again spent time reviewing patient's medical course.  Patient inquiring about why PET scan has not been completed and what further conversations with via with oncology.  Again reviewed that receiving any type of cancer directed therapy or PET scan is an outpatient procedure.  Patients do not normally receive cancer directed therapies inside the hospital because they are  acutely ill and cancer directed therapies could cause more harm than benefit.  Patient acknowledged this.  Spent time providing emotional support via active listening.  Again discussed patient's hopes to enjoy time on his front porch.  Patient is hopeful [facility] will be able to support further oxygen support to enjoy time.  Answered questions as able at that time.  Noted palliative medicine team to continue to following with patient's medical journey.  Review of Systems Continued sensation of shortness of breath Objective:   Vital Signs:  BP (!) 149/85 (BP Location: Right Arm)   Pulse 74   Temp (!) 97.3 F (36.3 C) (Oral)   Resp 18   Ht 5' 8 (1.727 m)   Wt 104.3 kg   SpO2 93%   BMI 34.97 kg/m   Physical Exam: General: NAD, alert, pleasant, chronically ill-appearing Cardiovascular: RRR Respiratory: Slightly increased work of breathing noted, not in respiratory distress Neuro: A&Ox4, following commands easily Psych: appropriately answers all questions  Assessment & Plan:   Assessment: Patient 73 year old male with past medical history of recently diagnosed stage IV non-small cell lung cancer with associated pleural effusion suspected to be malignant, COPD, OSA on CPAP at bedtime, CAD, hypertension, diabetes, GERD, BPH, mood disorder, and HFpEF who was admitted on 05/03/2024 for management of worsening shortness of breath and chest pressure.  Patient has had multiple hospitalizations recently for management of pneumonia and acute on chronic hypoxic respiratory failure.  Since admission, patient has received management for acute on chronic hypoxic respiratory failure and mild exacerbation of HFpEF.  Palliative medicine team consulted to assist with  complex medical decision making and symptom management.   Recommendations/Plan: # Complex medical decision making/goals of care:      - Discussed care with patient as detailed above in HPI.  Patient has acknowledged he has stage IV non-small  cell lung cancer.  Patient hoping to follow-up with oncology in the outpatient setting to determine if there are cancer directed therapies available to assist with allowing him more time.  Patient notes that his quality time is spent at home particularly on his front porch in his rocker.  Patient is hopeful for more time of this.  Patient notes his family is incredibly important to his life and he would like more time with them.  Continue appropriate medical interventions at this time.  Palliative medicine team continue to follow along and engage in conversations as able and appropriate.                - Patient has stated that when his time is short, he does not want to die at home.  Have discussed involvement of hospice when patient is no longer appropriate for cancer directed therapies and initiating these services through hospice originally at home.  Noted if patient having worsening symptom management and needing appropriate medications at end-of-life, could potentially coordinate with hospice transfer to inpatient hospice so that he could die at a hospice facility.  Have referred to palliative medicine team at [facility] to assist with care coordination moving forward.                -  Code Status: Limited: Do not attempt resuscitation (DNR) -DNR-LIMITED -Do Not Intubate/DNI    # Symptom management Patient is receiving these palliative interventions for symptom management with an intent to improve quality of life.                 - Shortness of breath, in setting of stage IV non-small cell lung cancer and COPD                               - Continue oxycodone  2.5 mg every 4 hours as needed.  Will continue to adjust based on patient's symptom response.   # Psycho-social/Spiritual Support:  - Support System: Wife, daughter   # Discharge Planning:  To Be Determined  Discussed with: Patient, RN, hospitalist  Thank you for allowing the palliative care team to participate in the care [name].  [name], DO Palliative Care Provider PMT # [phone]  If patient remains symptomatic despite maximum doses, please call PMT at [phone] between 0700 and 1900. Outside of these hours, please call attending, as PMT does not have night coverage.

## 2024-05-05 NOTE — Progress Notes (Signed)
 [facility]                                                                              Jose Patrick, is a 73 y.o. male, DOB - 30-Dec-1950, JWJ:[id number] Admit date - 05/03/2024    Outpatient Primary MD for the patient is [name]  LOS - 1  days  Chief Complaint  Patient presents with   Shortness of Breath       Brief summary   Patient is a 73 y.o. 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 He returns day after discharge with sudden worsening in his breathing. Reports was only using 2L O2 at home despite being Rx 4L at discharge. Ultimately due to symptoms increased to 2L -> 4L -> 6L. While at rest had significant SOB and associated chest pressure. Feels like his lungs are being compressed. He is having thick tenacious sputum described like white glue. And also scant hemoptysis (very small amount of blood tinged sputum at bedside reports similar to that at home).     Assessment & Plan       Acute on chronic hypoxic respiratory failure, outpatient on 4L O2  Stage IV NSCLC -seen by Dr. Liam Redhead last admission and recommended an outpatient PET scan which has not yet to be done Bilateral pleural effusion L > R, symptomatic  -  recent diagnosis Stage IV NSCLC with Bronchoscopy and Bx 6/5 consistent with NSCLC throughout the R lung; pathology consistent with adenocarcinoma. Additional lung nodules noted in the L lung. Bilateral effusion also suspect metastatic. Overall suspect he is symptomatic from underlying cancer, with additional  contributors from pleural effusion on the R, and his underlying COPD. Only other factor may be slight hypervolemia which we will treat as below. -  Did have recent rhinovirus pneumonia 6/2, but otherwise evaluation for bacterial pneumonia unrevealing; BAL Culture 6/5 no growth.  - IR consulted for thoracentesis, no significant fluid for tap - s/p TXA nebs, continue IV Solu-Medrol , bronchodilators, I-S, flutter valve - Palliative medicine consulted. - CTA chest negative for pulmonary embolism, consolidation of the entire right lower lobe with worsening insubstantial consolidation in the right middle lobe and right upper lobe, extensive small nodules diffusely throughout the both lungs, increased in size and number from 04/19/2024 (heavy burden of lung metastatic disease versus fungal or atypical infection). - Currently on 6 L O2 via [place], wheezing with dyspnea, requested oncology and pulmonology consult      HFpEF, with mild exacerbation  Mild Hypervolemia  - Last TTE 9/'24 with LVEF 55-60%, mild cLVH, grade 1 diastolic dysfunction. Despite relative low BNP has increased and may be low related to weight. Has significant lower extremity edema. - Continue IV Lasix , 40 mg daily, still positive balance 380 cc  -2D  echo showed EF of 55 to 60%, indeterminate diastolic parameters, RV SF   DM type II uncontrolled with with hyperglycemia -CBG is likely elevated due to steroids - Hemoglobin A1c 7.8 on 04/03/2024  CBG (last 3)  Recent Labs    05/04/24 2126 05/05/24 0717 05/05/24 1114  GLUCAP 340* 275* 265*   Increased Semglee  to 35 units nightly, increase NovoLog  to 12 units 3 times daily AC, continue moderate SSI   OSA: Was not compliant with his CPAP at home  CAD, chronic angina / HLD:  - Aspirin  had been discontinued last admission. -  Continue home atorvastatin , Zetia , metoprolol , ranolazine .    HTN: -  Continue home Metoprolol . Hold home Losartan  as BP reasonably controlled   GERD:  -  Continue famotidine , PPI   BPH:  - Continue finasteride     Bipolar d/o, PTSD: noted not on treatment   Obesity type II Estimated body mass index is 36.45 kg/m as calculated from the following:   Height as of this encounter: 5' 8 (1.727 m).   Weight as of this encounter: 108.7 kg.  Code Status: DNR DVT Prophylaxis:  SCDs Start: 05/03/24 1948   Level of Care: Level of care: Telemetry Family Communication: Patient's wife on the speaker phone during examination Disposition Plan:      Remains inpatient appropriate:      Procedures:    Consultants:   Consulted oncology today Consulted PCCM today Palliative medicine  Antimicrobials:   Anti-infectives (From admission, onward)    None          Medications  artificial tears  1 drop Both Eyes Daily   atorvastatin   40 mg Oral QPM   cycloSPORINE  1 drop Both Eyes BID   ezetimibe   10 mg Oral Daily   famotidine   20 mg Oral QHS   finasteride   5 mg Oral Daily   fluticasone  furoate-vilanterol  1 puff Inhalation Daily   furosemide   40 mg Intravenous Daily   gabapentin   300 mg Oral BID   guaiFENesin   1,200 mg Oral BID   insulin  aspart  0-15 Units Subcutaneous TID WC   insulin  aspart  10 Units Subcutaneous TID WC   insulin  glargine-yfgn  30 Units Subcutaneous QHS   ipratropium-albuterol   3 mL Nebulization QID   methylPREDNISolone  (SOLU-MEDROL ) injection  60 mg Intravenous Q12H   metoprolol  succinate  100 mg Oral Daily   pantoprazole   40 mg Oral QAC breakfast   ranolazine   500 mg Oral BID   umeclidinium bromide   1 puff Inhalation Daily      Subjective:   Jose Patrick was seen and examined today.  Having difficulty breathing on 6 L O2 via [place], having hemoptysis although per patient somewhat better today.  No acute chest pain, nausea vomiting, abdominal pain.   Objective:   Vitals:   05/05/24 1009 05/05/24 1149 05/05/24 1338 05/05/24 1403  BP:   (!) 177/144 120/67  Pulse:   79 72  Resp:   16   Temp:   97.7 F  (36.5 C)   TempSrc:   Oral   SpO2: (!) 88% 91% 92%   Weight:      Height:        Intake/Output Summary (Last 24 hours) at 05/05/2024 1420 Last data filed at 05/05/2024 1338 Gross per 24 hour  Intake 720 ml  Output --  Net 720 ml     Wt Readings from Last 3 Encounters:  05/05/24 108.7 kg  04/23/24 89.5 kg  04/05/24 108.3 kg  Exam General: Alert and oriented x 3, increased work of breathing, on 6 L O2 Cardiovascular: S1 S2 auscultated,  RRR Respiratory: Diminished breath sounds with wheezing Gastrointestinal: Soft, nontender, nondistended, + bowel sounds Ext: no pedal edema bilaterally Neuro no new deficits Psych: Normal affect     Data Reviewed:  I have personally reviewed following labs    CBC Lab Results  Component Value Date   WBC 18.5 (H) 05/05/2024   RBC 5.50 05/05/2024   HGB 14.4 05/05/2024   HCT 45.8 05/05/2024   MCV 83.3 05/05/2024   MCH 26.2 05/05/2024   PLT 265 05/05/2024   MCHC 31.4 05/05/2024   RDW 15.6 (H) 05/05/2024   LYMPHSABS 0.8 05/03/2024   MONOABS 0.5 05/03/2024   EOSABS 0.2 05/03/2024   BASOSABS 0.0 05/03/2024     Last metabolic panel Lab Results  Component Value Date   NA 136 05/05/2024   K 4.9 05/05/2024   CL 95 (L) 05/05/2024   CO2 28 05/05/2024   BUN 38 (H) 05/05/2024   CREATININE 1.14 05/05/2024   GLUCOSE 273 (H) 05/05/2024   GFRNONAA >60 05/05/2024   GFRAA  02/23/2009    >60        The eGFR has been calculated using the MDRD equation. This calculation has not been validated in all clinical situations. eGFR's persistently <60 mL/min signify possible Chronic Kidney Disease.   CALCIUM  9.4 05/05/2024   PHOS 5.0 (H) 05/04/2024   PROT 5.7 (L) 05/03/2024   ALBUMIN 2.5 (L) 05/03/2024   BILITOT 0.5 05/03/2024   ALKPHOS 58 05/03/2024   AST 25 05/03/2024   ALT 58 (H) 05/03/2024   ANIONGAP 13 05/05/2024    CBG (last 3)  Recent Labs    05/04/24 2126 05/05/24 0717 05/05/24 1114  GLUCAP 340* 275* 265*       Coagulation Profile: No results for input(s): INR, PROTIME in the last 168 hours.   Radiology Studies: I have personally reviewed the imaging studies  CT Angio Chest Pulmonary Embolism (PE) W or WO Contrast Result Date: 05/04/2024 CLINICAL DATA:  Stage IV non-small cell lung cancer, COPD, shortness of breath, chest pressure. * Tracking Code: BO * EXAM: CT ANGIOGRAPHY CHEST WITH CONTRAST TECHNIQUE: Multidetector CT imaging of the chest was performed using the standard protocol during bolus administration of intravenous contrast. Multiplanar CT image reconstructions and MIPs were obtained to evaluate the vascular anatomy. RADIATION DOSE REDUCTION: This exam was performed according to the departmental dose-optimization program which includes automated exposure control, adjustment of the mA and/or kV according to patient size and/or use of iterative reconstruction technique. CONTRAST:  75mL OMNIPAQUE  IOHEXOL  350 MG/ML SOLN COMPARISON:  04/01/2024 FINDINGS: Despite efforts by the technologist and patient, motion artifact is present on today's exam and could not be eliminated. This reduces exam sensitivity and specificity. Cardiovascular: No filling defect is identified in the pulmonary arterial tree to suggest pulmonary embolus. Coronary, aortic arch, and branch vessel atherosclerotic vascular disease. Borderline cardiomegaly. Mediastinum/Nodes: AP window lymph node 1.0 cm in short axis on image 32 series 4, stable. Stable mild right paratracheal and stable right hilar adenopathy. Mildly dilated distal esophagus with air-fluid level. Lungs/Pleura: Emphysema. Continued consolidation of the entire right lower lobe with worsening consolidation in the right middle lobe and right upper lobe. Extensive small nodules are present diffusely throughout both lungs, substantially increased in size and number from 04/19/2024, possibilities include heavy burden of lung metastatic disease versus fungal or atypical  infection. No cavitation identified. Upper Abdomen: Contracted gallbladder. Atheromatous  plaque at the origin of the celiac trunk with some narrowing of the celiac trunk which may be due to median arcuate ligament. Mild right retrocrural adenopathy. Musculoskeletal: Thoracic spondylosis. Lower cervical plate and screw fixator. Review of the MIP images confirms the above findings. IMPRESSION: 1. No filling defect is identified in the pulmonary arterial tree to suggest pulmonary embolus. Adversely affected negative predictive value due to motion artifact. 2. Continued consolidation of the entire right lower lobe with worsening and substantial consolidation in the right middle lobe and right upper lobe. 3. Extensive small nodules are present diffusely throughout both lungs, substantially increased in size and number from 04/19/2024, possibilities include heavy burden of lung metastatic disease versus fungal or atypical infection. 4. Stable mild mediastinal and right hilar adenopathy. 5. Mildly dilated distal esophagus with air-fluid level, suggesting reflux. 6. Aortic Atherosclerosis (ICD10-I70.0) and Emphysema (ICD10-J43.9). Electronically Signed   By: Freida Jes M.D.   On: 05/04/2024 16:45   US  CHEST (PLEURAL EFFUSION) Result Date: 05/04/2024 CLINICAL DATA:  Right-sided pleural effusion EXAM: CHEST ULTRASOUND COMPARISON:  None Available. FINDINGS: Minimal fluid in present upon examination with ultrasound. No procedure performed. IMPRESSION: Minimal pleural fluid. Electronically Signed   By: Susan Ensign   On: 05/04/2024 14:12   ECHOCARDIOGRAM COMPLETE Result Date: 05/04/2024    ECHOCARDIOGRAM REPORT   Patient Name:   NOA CONSTANTE Date of Exam: 05/04/2024 Medical Rec #:  [id number]          Height:       68.0 in Accession #:    [id number]         Weight:       197.3 lb Date of Birth:  May 31, 1951          BSA:          2.032 m Patient Age:    72 years           BP:           134/85 mmHg Patient  Gender: M                  HR:           81 bpm. Exam Location:  Inpatient Procedure: 2D Echo, Cardiac Doppler and Color Doppler (Both Spectral and Color            Flow Doppler were utilized during procedure). Indications:    Congestive Heart Failure I50.9  History:        Patient has prior history of Echocardiogram examinations, most                 recent 08/15/2023. Risk Factors:Hypertension and Diabetes.  Sonographer:    Astrid Blamer Referring Phys: [id number] JONATHAN SEGARS IMPRESSIONS  1. Technically difficult study with poor visualization of endocardium.  2. Left ventricular ejection fraction, by estimation, is 55 to 60%. The left ventricle has normal function. The left ventricle has no regional wall motion abnormalities. Left ventricular diastolic parameters are indeterminate.  3. Right ventricular systolic function is normal. The right ventricular size is normal.  4. The mitral valve is normal in structure. No evidence of mitral valve regurgitation. No evidence of mitral stenosis.  5. The aortic valve has an indeterminant number of cusps. Aortic valve regurgitation is not visualized. No aortic stenosis is present.  6. The inferior vena cava is normal in size with greater than 50% respiratory variability, suggesting right atrial pressure of 3 mmHg. FINDINGS  Left Ventricle: Left ventricular ejection fraction, by estimation,  is 55 to 60%. The left ventricle has normal function. The left ventricle has no regional wall motion abnormalities. The left ventricular internal cavity size was normal in size. There is  no left ventricular hypertrophy. Left ventricular diastolic parameters are indeterminate. Right Ventricle: The right ventricular size is normal. No increase in right ventricular wall thickness. Right ventricular systolic function is normal. Left Atrium: Left atrial size was normal in size. Right Atrium: Right atrial size was normal in size. Pericardium: There is no evidence of pericardial effusion. Mitral  Valve: The mitral valve is normal in structure. No evidence of mitral valve regurgitation. No evidence of mitral valve stenosis. Tricuspid Valve: The tricuspid valve is normal in structure. Tricuspid valve regurgitation is not demonstrated. No evidence of tricuspid stenosis. Aortic Valve: The aortic valve has an indeterminant number of cusps. Aortic valve regurgitation is not visualized. No aortic stenosis is present. Aortic valve mean gradient measures 3.0 mmHg. Aortic valve peak gradient measures 5.4 mmHg. Aortic valve area, by VTI measures 2.55 cm. Pulmonic Valve: The pulmonic valve was normal in structure. Pulmonic valve regurgitation is not visualized. No evidence of pulmonic stenosis. Aorta: The aortic root is normal in size and structure. Venous: The inferior vena cava is normal in size with greater than 50% respiratory variability, suggesting right atrial pressure of 3 mmHg. IAS/Shunts: No atrial level shunt detected by color flow Doppler.  LEFT VENTRICLE PLAX 2D LVIDd:         3.50 cm   Diastology LVIDs:         1.80 cm   LV e' medial:    6.85 cm/s LV PW:         0.90 cm   LV E/e' medial:  10.6 LV IVS:        0.60 cm   LV e' lateral:   9.46 cm/s LVOT diam:     1.90 cm   LV E/e' lateral: 7.7 LV SV:         57 LV SV Index:   28 LVOT Area:     2.84 cm  RIGHT VENTRICLE RV S prime:     12.80 cm/s TAPSE (M-mode): 2.0 cm LEFT ATRIUM             Index        RIGHT ATRIUM           Index LA Vol (A2C):   32.2 ml 15.85 ml/m  RA Area:     10.60 cm LA Vol (A4C):   47.3 ml 23.28 ml/m  RA Volume:   20.10 ml  9.89 ml/m LA Biplane Vol: 40.6 ml 19.98 ml/m  AORTIC VALVE AV Area (Vmax):    2.49 cm AV Area (Vmean):   2.22 cm AV Area (VTI):     2.55 cm AV Vmax:           116.00 cm/s AV Vmean:          85.700 cm/s AV VTI:            0.222 m AV Peak Grad:      5.4 mmHg AV Mean Grad:      3.0 mmHg LVOT Vmax:         102.00 cm/s LVOT Vmean:        67.000 cm/s LVOT VTI:          0.200 m LVOT/AV VTI ratio: 0.90  AORTA Ao  Root diam: 3.10 cm MITRAL VALVE MV Area (PHT): 2.08 cm    SHUNTS MV E  velocity: 72.80 cm/s  Systemic VTI:  0.20 m MV A velocity: 94.30 cm/s  Systemic Diam: 1.90 cm MV E/A ratio:  0.77 Aditya Sabharwal Electronically signed by Alwin Baars Signature Date/Time: 05/04/2024/2:02:29 PM    Final    DG Chest Portable 1 View Result Date: 05/03/2024 CLINICAL DATA:  Stage IV lung cancer, diabetes, productive cough after bronchoscopy yesterday EXAM: PORTABLE CHEST 1 VIEW COMPARISON:  04/25/2024 FINDINGS: Single frontal view of the chest demonstrates a stable cardiac silhouette. There is widespread bilateral airspace disease, right greater than left, increased since prior study. Underlying effusions are suspected, right greater than left. No pneumothorax. No acute bony abnormalities. IMPRESSION: 1. Progressive widespread bilateral airspace disease, right greater than left, which could reflect worsening infection, edema, or hemorrhage. 2. Bilateral pleural effusions, right greater than left. Electronically Signed   By: Bobbye Burrow M.D.   On: 05/03/2024 17:33       Kenechukwu Eckstein M.D. [facility] 05/05/2024, 2:20 PM  Available via Epic secure chat 7am-7pm After 7 pm, please refer to night coverage provider listed on amion.

## 2024-05-05 NOTE — Consult Note (Signed)
 NAME:  Jose Patrick, MRN:  [id number], DOB:  1951-02-27, LOS: 1 ADMISSION DATE:  05/03/2024 CONSULTATION DATE:  05/05/2024 REFERRING MD:  Thelma Fire - TRH, CHIEF COMPLAINT:  Hemoptysis, productive cough, increased O2 requirement   History of Present Illness:  73 year old man who presented to [facility] 6/16 for SOB, increased O2 requirements and productive cough. 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 Recently admitted 02/2024 (PNA of RML/RLL and concern for central obstructing lesion and increased pulmonary nodules c/f metastatic disease) and 6/2-6/15 for acute-on-chronic respiratory failure with bronchoscopy 6/5 demonstrating NSCLC.   Patient states that he was discharged from [facility] 6/15. He was home < 12 hours when he had recurrence of SOB/chest pressure and required uptitration of his O2 from 2L to 4L, then eventually 6L. His wife brought him back to the ED where he was readmitted for hypoxia. He reports persistent fatigue, attributing this to recent hospitalization and illness as well as his cancer. He also reported bilateral leg swelling, which is improved with diuresis. He has had cough productive of thick, glue-like white sputum. Notes that he has desaturations to high 70s even with slow ambulation of a few steps to the bathroom and mentions that he takes a long time to recover after these episodes.   During his last admission, [place] underwent bronchoscopy with Dr. Baldwin Levee (LBP) which demonstrated multiple biopsies c/w widespread lung cancer/metastatic disease. Jose Patrick was seen by Dr. Marguerita Shih with Oncology in an attempt to centralize his care here at [facility]; MRI Brain was requested for staging/rule out of metastases and was negative. PET scan was requested but not yet completed. CT Chest 6/17  demonstrated continued consolidation of the entire RLL with worsening and substantial consolidation of the RML/RUL; extensive small nodules noted diffusely throughout both lungs, substantially increased in size/number from scan 6/2, worrisome for heavy burden of metastatic lung disease versus fungal/atypical infection.   PCCM consulted in the setting of hypoxia with increased O2 requirement and worsened imaging findings.  Pertinent Medical History:   Past Medical History:  Diagnosis Date   ALLERGIC RHINITIS 10/08/2007   Qualifier: Diagnosis of  By: Autry Legions MD, Alveda Aures    BENIGN PROSTATIC HYPERTROPHY 09/28/2010   Qualifier: Diagnosis of  By: Autry Legions MD, Alveda Aures    Bipolar affective disorder (HCC) 09/30/2012   COLONIC POLYPS, HX OF 10/08/2007   Qualifier: Diagnosis of  By: Autry Legions MD, Alveda Aures    CONSTIPATION, CHRONIC, HX OF 08/10/2007   Qualifier: Diagnosis of  By: Jarold Merlin    DIABETES MELLITUS, TYPE II 10/08/2007   Qualifier: Diagnosis of  By: Autry Legions MD, Alveda Aures    ERECTILE DYSFUNCTION 10/08/2007   Qualifier: Diagnosis of  By: Autry Legions MD, Alveda Aures    HYPERLIPIDEMIA 10/08/2007   Qualifier: Diagnosis of  By: Autry Legions MD, Alveda Aures    HYPERTENSION 03/20/2009   Qualifier: Diagnosis of  By: Autry Legions MD, Alveda Aures    Non-small cell lung cancer (NSCLC) [facility])    Widespread bilateral pulmonary nodules   PTSD (post-traumatic stress disorder) 09/30/2012   SPINAL STENOSIS, LUMBAR 10/08/2007   Per Dr Larrie Po - [facility]   Qualifier: Diagnosis of  By: Autry Legions MD, Alveda Aures     [facility] Events: Including procedures, antibiotic start and stop dates in addition to other pertinent events  6/16 - Presented to [facility] for ongoing SOB/fatigue, increased O2 needs. CTA Chest negative for PE, demonstrated continued consolidation of the entire RLL with worsening and substantial consolidation of the RML/RUL; extensive small nodules noted diffusely throughout both lungs, substantially increased in  size/number from scan 6/2, worrisome for heavy burden of metastatic lung disease versus fungal/atypical infection. 6/17 - PMT consulted. 6/18 - PCCM consulted.  Interim History / Subjective:  PCCM consulted for hypoxia, increased O2 requirement.  Objective:  Blood pressure 120/67, pulse 72, temperature 97.7 F (36.5 C), temperature source Oral, resp. rate 16, height 5' 8 (1.727 m), weight 108.7 kg, SpO2 92%.        Intake/Output Summary (Last 24 hours) at 05/05/2024 1618 Last data filed at 05/05/2024 1338 Gross per 24 hour  Intake 720 ml  Output --  Net 720 ml   Filed Weights   05/03/24 1617 05/04/24 0712 05/05/24 0706  Weight: 89.5 kg 104.3 kg 108.7 kg   Physical Examination: General: Chronically ill-appearing older man in NAD. Sitting at EOB, pleasant and conversant. HEENT: Jose Patrick/AT, anicteric sclera, PERRL, moist mucous membranes. Neuro: Awake, oriented x 4. Responds to verbal stimuli. Following commands consistently. Moves all 4 extremities spontaneously. Generalized weakness. CV: RRR, no m/g/r. PULM: Breathing even and mildly labored on 6LNC. Lungs diminished throughout, R > L. Mild conversational dyspnea and coughing with deep breaths. GI: Soft, nontender, nondistended. Extremities: Bilateral symmetric 1+ pitting LE edema noted. Skin: Warm/dry, no rashes.  Resolved Hospital Problem List:    Assessment & Plan:   Stage IV NSCLC with widespread lung involvement Complete consolidation of RLL with involvement of RML/RUL, consistent with increased tumor burden COPD with emphysema, baseline 4L HOT OSA, not on CPAP Unfortunately, Jose Patrick's CT findings from 6/17 have significantly worsened since CT Chest obtained 6/2. Bronchoscopy 6/5 with multiple sites positive for malignant cells, concerning for widespread lung cancer/metastasis. Discussed with patient and his wife, [name], via phone; his fatigue and SOB is likely multifactorial and increased tumor burden is the primary driver of  the consolidation causing his worsening hypoxia and increased O2 needs. We discussed the concern that often times treatment for advanced cancer is more difficult for the body to tolerate than the disease itself, and that Trea may not be a candidate for further treatment (discussed specifically that surgery and radiation would not be helpful at this point due to widespread disease). Would recommend supportive care, d/w Oncology regarding any additional treatments (even if palliation-focused), and PMT consult.  - Supplemental O2 support for SpO2 > 88% - Bronchodilators (transition to triple therapy nebs, Brovana /Yupelri /Pulmicort ) - Continue steroids - Pulmonary hygiene - Oncology engagement for evaluation for any remaining therapeutic options - Appreciate PMT consultation, this is appropriate at this time - Can consider transfer to SDU if significantly increased O2 needs, ok for floor at present  Best Practice: (right click and Reselect all SmartList Selections daily)   Per Primary Team  Labs:  CBC: Recent Labs  Lab 04/29/24 0549 05/01/24 0656 05/03/24 1641 05/03/24 1648 05/04/24 0621 05/05/24 0537  WBC 9.2 12.0* 14.3*  --  14.3* 18.5*  NEUTROABS  --   --  12.7*  --   --   --   HGB 15.1 15.4 14.7 16.7 15.0 14.4  HCT 48.2 49.5 46.9 49.0 48.2 45.8  MCV 83.7 83.6 83.6  --  83.2 83.3  PLT 282 275 274  --  290 265   Basic Metabolic Panel: Recent Labs  Lab 04/29/24 0549 05/01/24 0656 05/03/24 1648 05/03/24 1725  05/04/24 0621 05/04/24 1647 05/05/24 0537  NA 137 137 134* 134* 140  --  136  K 4.3 5.0 4.7 4.6 4.7  --  4.9  CL 98 95* 95* 95* 95*  --  95*  CO2 31 32  --  27 30  --  28  GLUCOSE 115* 135* 404* 383* 294* 465* 273*  BUN 18 17 26* 22 27*  --  38*  CREATININE 0.86 0.85 1.00 1.12 1.17  --  1.14  CALCIUM  9.4 9.4  --  9.2 10.0  --  9.4  MG  --   --   --   --  2.4  --   --   PHOS  --   --   --   --  5.0*  --   --    GFR: Estimated Creatinine Clearance: 70 mL/min (by  C-G formula based on SCr of 1.14 mg/dL). Recent Labs  Lab 05/01/24 0656 05/03/24 1641 05/04/24 0621 05/05/24 0537  PROCALCITON  --   --  0.10  --   WBC 12.0* 14.3* 14.3* 18.5*   Liver Function Tests: Recent Labs  Lab 05/03/24 1725  AST 25  ALT 58*  ALKPHOS 58  BILITOT 0.5  PROT 5.7*  ALBUMIN 2.5*   No results for input(s): LIPASE, AMYLASE in the last 168 hours. No results for input(s): AMMONIA in the last 168 hours.  ABG:    Component Value Date/Time   HCO3 32.5 (H) 05/03/2024 1640   TCO2 28 05/03/2024 1648   O2SAT 79.4 05/03/2024 1640    Coagulation Profile: No results for input(s): INR, PROTIME in the last 168 hours.  Cardiac Enzymes: No results for input(s): CKTOTAL, CKMB, CKMBINDEX, TROPONINI in the last 168 hours.  HbA1C: Hgb A1c MFr Bld  Date/Time Value Ref Range Status  04/03/2024 03:52 AM 7.8 (H) 4.8 - 5.6 % Final    Comment:    (NOTE) Pre diabetes:          5.7%-6.4%  Diabetes:              >6.4%  Glycemic control for   <7.0% adults with diabetes   09/30/2012 10:31 AM 7.6 (H) 4.6 - 6.5 % Final    Comment:    Glycemic Control Guidelines for People with Diabetes:Non Diabetic:  <6%Goal of Therapy: <7%Additional Action Suggested:  >8%    CBG: Recent Labs  Lab 05/04/24 1155 05/04/24 1614 05/04/24 2126 05/05/24 0717 05/05/24 1114  GLUCAP 246* 438* 340* 275* 265*   Review of Systems:   Review of systems completed with pertinent positives/negatives outlined in above HPI.  Past Medical History:  He,  has a past medical history of ALLERGIC RHINITIS (10/08/2007), BENIGN PROSTATIC HYPERTROPHY (09/28/2010), Bipolar affective disorder (HCC) (09/30/2012), COLONIC POLYPS, HX OF (10/08/2007), CONSTIPATION, CHRONIC, HX OF (08/10/2007), DIABETES MELLITUS, TYPE II (10/08/2007), ERECTILE DYSFUNCTION (10/08/2007), HYPERLIPIDEMIA (10/08/2007), HYPERTENSION (03/20/2009), Non-small cell lung cancer (NSCLC) (HCC), PTSD (post-traumatic stress  disorder) (09/30/2012), and SPINAL STENOSIS, LUMBAR (10/08/2007).   Surgical History:   Past Surgical History:  Procedure Laterality Date   COLONOSCOPY WITH PROPOFOL  N/A 04/09/2021   Procedure: COLONOSCOPY WITH PROPOFOL ;  Surgeon: Irby Mannan, MD;  Location: [facility] ENDOSCOPY;  Service: Endoscopy;  Laterality: N/A;   cyst off right buttock     ESOPHAGOGASTRODUODENOSCOPY (EGD) WITH PROPOFOL  N/A 04/09/2021   Procedure: ESOPHAGOGASTRODUODENOSCOPY (EGD) WITH PROPOFOL ;  Surgeon: Irby Mannan, MD;  Location: [facility] ENDOSCOPY;  Service: Endoscopy;  Laterality: N/A;   PREPATELLAR BURSA EXCISION     removal  right   s/p bilat hydrocelectomy  2008   x 2   TONSILLECTOMY     VIDEO BRONCHOSCOPY Right 04/22/2024   Procedure: BRONCHOSCOPY, WITH FLUOROSCOPY;  Surgeon: Denson Flake, MD;  Location: [facility] ENDOSCOPY;  Service: Cardiopulmonary;  Laterality: Right;   Social History:   reports that he quit smoking about 12 years ago. His smoking use included cigarettes. He started smoking about 53 years ago. He has a 61.5 pack-year smoking history. He has never used smokeless tobacco. He reports current alcohol use. He reports that he does not use drugs.   Family History:  His family history includes Allergies in his sister; Anxiety disorder in his mother; Cancer in his mother; Depression in his mother; Ulcers in his mother.   Allergies: Allergies  Allergen Reactions   Chlorzoxazone Swelling and Other (See Comments)    Psychosis  (Parafon Forte) ABSOLUTELY CANNOT HAVE THIS!!   Tramadol Other (See Comments)    Muscles Tightness     Home Medications: Prior to Admission medications   Medication Sig Start Date End Date Taking? Authorizing Provider  albuterol  (VENTOLIN  HFA) 108 (90 Base) MCG/ACT inhaler Inhale 2 puffs into the lungs every 6 (six) hours as needed for wheezing or shortness of breath. 03/19/22   Quillian Brunt, MD  ammonium lactate (LAC-HYDRIN) 12 % lotion Apply 1 Application topically  as needed for dry skin.    [provider]  aspirin  81 MG chewable tablet Chew 1 tablet (81 mg total) by mouth daily. Patient not taking: Reported on 05/04/2024 12/19/11   Roslyn Coombe, MD  atorvastatin  (LIPITOR) 40 MG tablet Take 1 tablet (40 mg total) by mouth daily. Patient taking differently: Take 40 mg by mouth every evening. 08/04/23   Thukkani, Arun K, MD  benzonatate  (TESSALON ) 200 MG capsule Take 1 capsule (200 mg total) by mouth 3 (three) times daily as needed for cough. Patient not taking: Reported on 04/19/2024 04/07/24   Armenta Landau, MD  Cholecalciferol  (VITAMIN D3) 1000 units CAPS Take 1,000 Units by mouth daily.    [provider]  cycloSPORINE (RESTASIS) 0.05 % ophthalmic emulsion Place 1 drop into both eyes 2 (two) times daily. 01/12/24   [provider]  dextromethorphan -guaiFENesin  (TUSSIN DM) 10-100 MG/5ML liquid Take 5 mLs by mouth every 4 (four) hours as needed for cough. Patient not taking: Reported on 04/19/2024 03/19/24   Antonio Baumgarten, NP  docusate sodium  (COLACE) 100 MG capsule Take 100 mg by mouth daily as needed for mild constipation.    [provider]  ezetimibe  (ZETIA ) 10 MG tablet Take 10 mg by mouth daily. 12/09/23   [provider]  famotidine  (PEPCID ) 20 MG tablet Take 1 tablet (20 mg total) by mouth at bedtime. 04/07/24   Armenta Landau, MD  feeding supplement (ENSURE ENLIVE / ENSURE PLUS) LIQD Take 237 mLs by mouth 2 (two) times daily between meals. Patient not taking: Reported on 04/19/2024 04/07/24   Armenta Landau, MD  ferrous sulfate  325 (65 FE) MG EC tablet Take 325 mg by mouth in the morning and at bedtime.    [provider]  finasteride  (PROSCAR ) 5 MG tablet Take 1 tablet (5 mg total) by mouth daily. 05/03/24   Vann, Jessica U, DO  fluticasone  (FLONASE ) 50 MCG/ACT nasal spray Place 2 sprays into both nostrils in the morning. 08/11/23   [provider]  fluticasone -salmeterol (WIXELA  INHUB) 250-50 MCG/ACT AEPB ONE puff in the in morning and ONE puff in the  evening Patient taking differently: Inhale 1 puff into the lungs in the morning. 01/14/24   Quillian Brunt, MD  furosemide  (LASIX ) 20 MG tablet Take 20 mg by mouth as needed for edema.    [provider]  gabapentin  (NEURONTIN ) 300 MG capsule Take 300 mg by mouth 2 (two) times daily. Take 300 mg by mouth in the morning & at bedtime- and an additional 300 mg once a day as needed for unresolved neuropathy    [provider]  glucose 4 GM chewable tablet Chew 1 tablet by mouth as needed for low blood sugar.    [provider]  guaiFENesin -codeine  100-10 MG/5ML syrup Take 5 mLs by mouth 2 (two) times daily as needed for cough.    [provider]  insulin  glargine (LANTUS ) 100 UNIT/ML injection Inject 0.25 mLs (25 Units total) into the skin daily. Patient taking differently: Inject 20-30 Units into the skin at bedtime. 04/07/24   Armenta Landau, MD  ipratropium-albuterol  (DUONEB) 0.5-2.5 (3) MG/3ML SOLN Take 3 mLs by nebulization 3 (three) times daily for 5 days, THEN 3 mLs every 6 (six) hours as needed. 04/07/24 04/12/25  Armenta Landau, MD  JARDIANCE  25 MG TABS tablet Take 25 mg by mouth at bedtime.    [provider]  losartan  (COZAAR ) 25 MG tablet Take 12.5 mg by mouth daily. 01/01/23   [provider]  Magnesium  Oxide 420 MG TABS Take 420 mg by mouth daily with breakfast.    [provider]  metFORMIN  (GLUCOPHAGE ) 1000 MG tablet Take 1,000 mg by mouth 2 (two) times daily. 09/14/21   [provider]  metoprolol  succinate (TOPROL -XL) 100 MG 24 hr tablet Take 100 mg by mouth daily. Take with or immediately following a meal.    [provider]  omeprazole  (PRILOSEC) 40 MG capsule Take 1 capsule (40 mg total) by mouth daily. Patient taking differently: Take 40 mg by mouth daily before breakfast. 04/07/24   Armenta Landau, MD  OXYGEN Inhale 2  L/min into the lungs continuous.    [provider]  predniSONE  (DELTASONE ) 10 MG tablet 30 mg x 2 days then 20mg  x 4 days then 10mg  x 4 days then 5 mg x 4 days then stop 05/03/24   Vann, Jessica U, DO  psyllium (METAMUCIL SMOOTH TEXTURE) 58.6 % powder Take 1 packet by mouth daily. Patient not taking: Reported on 04/19/2024 12/20/21   Marnee Sink, MD  pyridoxine  (B-6) 100 MG tablet Take 100 mg by mouth daily.    [provider]  ranolazine  (RANEXA ) 500 MG 12 hr tablet Take 1 tablet (500 mg total) by mouth 2 (two) times daily. 02/11/23   Sonny Dust, MD  Semaglutide , 2 MG/DOSE, 8 MG/3ML SOPN Inject 2 mg into the skin every Sunday. 02/14/24   [provider]  sildenafil (VIAGRA) 100 MG tablet Take 100 mg by mouth daily as needed for erectile dysfunction.    [provider]  SYSTANE BALANCE 0.6 % SOLN Place 1 drop into both eyes in the morning.    [provider]  Testosterone 1.62 % GEL Place 2 Pump onto the skin in the morning.    [provider]  Tiotropium Bromide  Monohydrate (SPIRIVA  RESPIMAT) 2.5 MCG/ACT AERS Inhale 2 puffs into the lungs daily. 01/14/24   Quillian Brunt, MD  triamcinolone ointment (KENALOG) 0.1 % Apply 1 Application topically 2 (two) times daily.    [provider]    Signature:   Trevor Fudge  Orpah Blackwater, PA-C [facility] 05/05/24 4:18 PM  Please see [email or web address] for pager details.  From 7A-7P if no response, please call [phone] After hours, please call ELink [phone]

## 2024-05-05 NOTE — Plan of Care (Signed)

## 2024-05-05 NOTE — Progress Notes (Signed)
   05/05/24 2316  BiPAP/CPAP/SIPAP  BiPAP/CPAP/SIPAP Pt Type Adult  BiPAP/CPAP/SIPAP DREAMSTATIOND  Mask Type Full face mask  Dentures removed? Not applicable  Respiratory Rate 20 breaths/min  Flow Rate 5 lpm  Patient Home Machine No  Patient Home Mask No  Patient Home Tubing No  Auto Titrate Yes  Minimum cmH2O 5 cmH2O  Maximum cmH2O 20 cmH2O  CPAP/SIPAP surface wiped down Yes  Device Plugged into RED Power Outlet Yes  BiPAP/CPAP /SiPAP Vitals  SpO2 93 %  Bilateral Breath Sounds Diminished;Expiratory wheezes

## 2024-05-06 ENCOUNTER — Other Ambulatory Visit: Payer: Self-pay

## 2024-05-06 DIAGNOSIS — C3411 Malignant neoplasm of upper lobe, right bronchus or lung: Secondary | ICD-10-CM | POA: Diagnosis not present

## 2024-05-06 DIAGNOSIS — C3490 Malignant neoplasm of unspecified part of unspecified bronchus or lung: Secondary | ICD-10-CM | POA: Diagnosis not present

## 2024-05-06 DIAGNOSIS — Z515 Encounter for palliative care: Secondary | ICD-10-CM | POA: Diagnosis not present

## 2024-05-06 DIAGNOSIS — J449 Chronic obstructive pulmonary disease, unspecified: Secondary | ICD-10-CM | POA: Diagnosis not present

## 2024-05-06 DIAGNOSIS — J9621 Acute and chronic respiratory failure with hypoxia: Secondary | ICD-10-CM | POA: Diagnosis not present

## 2024-05-06 DIAGNOSIS — R042 Hemoptysis: Secondary | ICD-10-CM | POA: Diagnosis not present

## 2024-05-06 DIAGNOSIS — Z7189 Other specified counseling: Secondary | ICD-10-CM | POA: Diagnosis not present

## 2024-05-06 LAB — GLUCOSE, CAPILLARY
Glucose-Capillary: 169 mg/dL — ABNORMAL HIGH (ref 70–99)
Glucose-Capillary: 240 mg/dL — ABNORMAL HIGH (ref 70–99)
Glucose-Capillary: 249 mg/dL — ABNORMAL HIGH (ref 70–99)
Glucose-Capillary: 246 mg/dL — ABNORMAL HIGH (ref 70–99)

## 2024-05-06 MED ORDER — SODIUM CHLORIDE 0.9 % IN NEBU: 3.0000 mL | INHALATION_SOLUTION | RESPIRATORY_TRACT | Status: DC | PRN

## 2024-05-06 MED ORDER — SODIUM CHLORIDE 3 % IN NEBU
4.0000 mL | INHALATION_SOLUTION | Freq: Two times a day (BID) | RESPIRATORY_TRACT | Status: DC
  Administered 2024-05-07 – 2024-05-08 (×3): 4 mL via RESPIRATORY_TRACT
  Filled 2024-05-06 (×5): qty 4

## 2024-05-06 NOTE — Progress Notes (Signed)
 Subjective: The patient seen and examined today.  Many family members including his wife and children as well has his sister with her at the bedside.  The patient was recently diagnosed with metastatic adenocarcinoma of lung primary involving the right upper, middle and lower lobes in addition to left pulmonary nodules diagnosed in June 2025 after bronchoscopy for evaluation of his airspace disease. He was discharged from the hospital few days ago but was readmitted with significant shortness of breath.  He is followed by Dr. Farrell Honey at the [facility] in [place] .  He continues to have significant shortness of breath and currently on home oxygen.  He also has some tightness in his chest but no significant fever or chills.  He has no nausea, vomiting, diarrhea or constipation.  Objective: Vital signs in last 24 hours: Temp:  [97.3 F (36.3 C)-98.4 F (36.9 C)] 97.8 F (36.6 C) (06/19 1315) Pulse Rate:  [65-71] 71 (06/19 1315) Resp:  [14-23] 16 (06/19 1315) BP: (126-139)/(66-97) 126/70 (06/19 1315) SpO2:  [93 %-97 %] 94 % (06/19 1607) FiO2 (%):  [40 %] 40 % (06/19 1607) Weight:  [241 lb 10 oz (109.6 kg)] 241 lb 10 oz (109.6 kg) (06/19 0512)  Intake/Output from previous day: 06/18 0701 - 06/19 0700 In: 480 [P.O.:480] Out: -  Intake/Output this shift: Total I/O In: 480 [P.O.:480] Out: -   General appearance: alert, cooperative, fatigued, and mild distress Resp: rales bilaterally and wheezes bilaterally Cardio: regular rate and rhythm, S1, S2 normal, no murmur, click, rub or gallop GI: soft, non-tender; bowel sounds normal; no masses,  no organomegaly Extremities: extremities normal, atraumatic, no cyanosis or edema  Lab Results:  Recent Labs    05/04/24 0621 05/05/24 0537  WBC 14.3* 18.5*  HGB 15.0 14.4  HCT 48.2 45.8  PLT 290 265   BMET Recent Labs    05/04/24 0621 05/04/24 1647 05/05/24 0537  NA 140  --  136  K 4.7  --  4.9  CL 95*  --  95*  CO2 30  --   28  GLUCOSE 294* 465* 273*  BUN 27*  --  38*  CREATININE 1.17  --  1.14  CALCIUM  10.0  --  9.4    Studies/Results: No results found.  Medications: I have reviewed the patient's current medications.   Assessment/Plan: This is a very pleasant 73 years old African-American male with stage IV non-small cell lung cancer, adenocarcinoma presented with widely spread his disease with lymphangitic spread in the right lung as well as left pulmonary nodules diagnosed in June 2025.  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 The patient  was readmitted to the hospital with significant dyspnea.  This is likely secondary to his disease. I had a lengthy discussion with the patient and his family today about his current condition and treatment options.  I personally and independently reviewed his scan images and showed the images to the patient and his family. I discussed with the patient his treatment options including palliative systemic chemoimmunotherapy if he has no actionable mutations or treatment with targeted therapy if the molecular studies that were sent for testing showed an actionable mutation but this may take up to 2 weeks for the results to be back. I will complete his staging workup by ordering a PET scan on  outpatient basis after discharge.  I may also give the patient 1 cycle of systemic chemotherapy with carboplatin and Alimta until the molecular studies are available but this will be done as outpatient after discharge. I also discussed with the patient the option of palliative care on hospice but he is interested in treatment.  He also indicated that he would like to receive his treatment in [place] and he will reach out to the [place] for community referral to be treated closer to home. He has an appointment scheduled with me on May 13, 2024 if he is out of the hospital. Thank you so much for taking good care of [name], please call if you have any questions.   LOS: 2 days    Aurelio Blower 05/06/2024

## 2024-05-06 NOTE — Progress Notes (Signed)
 [facility]                                                                              Jose Patrick, is a 73 y.o. male, DOB - 1950/11/20, MVH:[id number] Admit date - 05/03/2024    Outpatient Primary MD for the patient is Clinic, Jose Patrick  LOS - 2  days  Chief Complaint  Patient presents with   Shortness of Breath       Brief summary   Patient is a 73 y.o. 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 He returns day after discharge with sudden worsening in his breathing. Reports was only using 2L O2 at home despite being Rx 4L at discharge. Ultimately due to symptoms increased to 2L -> 4L -> 6L. While at rest had significant SOB and associated chest pressure. Feels like his lungs are being compressed. He is having thick tenacious sputum described like white glue. And also scant hemoptysis (very small amount of blood tinged sputum at bedside reports similar to that at home).     Assessment & Plan    Acute on chronic hypoxic respiratory failure, outpatient on 4L O2  Stage IV NSCLC -seen by Dr. Liam Redhead last admission and recommended an outpatient PET scan which has not yet to be done Bilateral pleural effusion L > R, symptomatic  -  recent diagnosis Stage IV NSCLC with Bronchoscopy and Bx 6/5 consistent with NSCLC throughout the R lung; pathology consistent with adenocarcinoma. Additional lung nodules noted in the L lung. Bilateral effusion also suspect metastatic. Overall suspect he is symptomatic from underlying cancer, with additional contributors from  pleural effusion on the R, and his underlying COPD. Only other factor may be slight hypervolemia which we will treat as below. -  Did have recent rhinovirus pneumonia 6/2, but otherwise evaluation for bacterial pneumonia unrevealing; BAL Culture 6/5 no growth.  - IR consulted for thoracentesis, no significant fluid for tap - s/p TXA nebs, continue IV Solu-Medrol , bronchodilators, I-S, flutter valve - Palliative medicine consulted. - CTA chest negative for pulmonary embolism, consolidation of the entire right lower lobe with worsening insubstantial consolidation in the right middle lobe and right upper lobe, extensive small nodules diffusely throughout the both lungs, increased in size and number from 04/19/2024 (heavy burden of lung metastatic disease versus fungal or atypical infection). - On 5 L O2 via [place], wean as tolerated  - Per patient, he is feeling somewhat better today, hemoptysis improving  - Pulmonology consulted, unfortunately not much to offer, recommended continue current management and oncology evaluation.  Palliative medicine following. - d/w Dr. Marguerita Shih today, will evaluate patient, although did not feel there would be much to offer while  inpatient      HFpEF, with mild exacerbation  Mild Hypervolemia  -  Last TTE 9/'24 with LVEF 55-60%, mild cLVH, grade 1 diastolic dysfunction. Despite relative low BNP has increased and may be low related to weight. Has significant lower extremity edema. -Per patient, shortness of breath and lower extremity edema improving. - Continue IV Lasix , 40 mg daily   -2D echo showed EF of 55 to 60%, indeterminate diastolic parameters, RV SF   DM type II uncontrolled with with hyperglycemia -CBG is likely elevated due to steroids - Hemoglobin A1c 7.8 on 04/03/2024  CBG (last 3)  Recent Labs    05/05/24 2123 05/06/24 0731 05/06/24 1126  GLUCAP 249* 169* 240*   -Continue Semglee  35 units  qhs, NovoLog  12 units 3 times daily AC, continue moderate  SSI   OSA: Was not compliant with his CPAP at home  CAD, chronic angina / HLD:  - Aspirin  had been discontinued last admission. -  Continue home atorvastatin , Zetia , metoprolol , ranolazine .    HTN: -  Continue home Metoprolol . Hold home Losartan  as BP reasonably controlled   GERD:  - Continue famotidine , PPI   BPH:  - Continue finasteride     Bipolar d/o, PTSD: noted not on treatment   Obesity type II Estimated body mass index is 36.74 kg/m as calculated from the following:   Height as of this encounter: 5' 8 (1.727 m).   Weight as of this encounter: 109.6 kg.  Code Status: DNR DVT Prophylaxis:  SCDs Start: 05/03/24 1948   Level of Care: Level of care: Telemetry Family Communication: Patient's sister, Henry Loge on the speaker phone during examination Disposition Plan:      Remains inpatient appropriate:      Procedures:    Consultants:    oncology PCCM  Palliative medicine  Antimicrobials:   Anti-infectives (From admission, onward)    None          Medications  arformoterol   15 mcg Nebulization BID   artificial tears  1 drop Both Eyes Daily   atorvastatin   40 mg Oral QPM   budesonide  (PULMICORT ) nebulizer solution  0.25 mg Nebulization BID   cycloSPORINE  1 drop Both Eyes BID   ezetimibe   10 mg Oral Daily   famotidine   20 mg Oral QHS   finasteride   5 mg Oral Daily   furosemide   40 mg Intravenous Daily   gabapentin   400 mg Oral TID   guaiFENesin   1,200 mg Oral BID   insulin  aspart  0-15 Units Subcutaneous TID WC   insulin  aspart  12 Units Subcutaneous TID WC   insulin  glargine-yfgn  35 Units Subcutaneous QHS   ipratropium-albuterol   3 mL Nebulization QID   methylPREDNISolone  (SOLU-MEDROL ) injection  60 mg Intravenous Q12H   metoprolol  succinate  100 mg Oral Daily   pantoprazole   40 mg Oral QAC breakfast   ranolazine   500 mg Oral BID   revefenacin   175 mcg Nebulization Daily      Subjective:   [name] was seen and examined today.   Sitting at the window chair today, feeling better, states hemoptysis is improving, lower extremity swelling and breathing is getting better.  No fevers, chills, chest pain, abdominal pain.     Vitals:   05/06/24 0854 05/06/24 0902 05/06/24 1315 05/06/24 1354  BP:   126/70   Pulse:   71   Resp:  14 16   Temp:   97.8 F (36.6 C)   TempSrc:   Oral   SpO2: 95%  94% 95%  Weight:      Height:  Intake/Output Summary (Last 24 hours) at 05/06/2024 1404 Last data filed at 05/06/2024 1315 Gross per 24 hour  Intake 480 ml  Output --  Net 480 ml     Wt Readings from Last 3 Encounters:  05/06/24 109.6 kg  04/23/24 89.5 kg  04/05/24 108.3 kg   Physical Exam General: Alert and oriented x 3, NAD Cardiovascular: S1 S2 clear, RRR.  Respiratory: CTAB Gastrointestinal: Soft, nontender, nondistended, NBS Ext: trace-1+ pedal edema bilaterally Neuro: no new deficits psych: Normal affect    Data Reviewed:  I have personally reviewed following labs    CBC Lab Results  Component Value Date   WBC 18.5 (H) 05/05/2024   RBC 5.50 05/05/2024   HGB 14.4 05/05/2024   HCT 45.8 05/05/2024   MCV 83.3 05/05/2024   MCH 26.2 05/05/2024   PLT 265 05/05/2024   MCHC 31.4 05/05/2024   RDW 15.6 (H) 05/05/2024   LYMPHSABS 0.8 05/03/2024   MONOABS 0.5 05/03/2024   EOSABS 0.2 05/03/2024   BASOSABS 0.0 05/03/2024     Last metabolic panel Lab Results  Component Value Date   NA 136 05/05/2024   K 4.9 05/05/2024   CL 95 (L) 05/05/2024   CO2 28 05/05/2024   BUN 38 (H) 05/05/2024   CREATININE 1.14 05/05/2024   GLUCOSE 273 (H) 05/05/2024   GFRNONAA >60 05/05/2024   GFRAA  02/23/2009    >60        The eGFR has been calculated using the MDRD equation. This calculation has not been validated in all clinical situations. eGFR's persistently <60 mL/min signify possible Chronic Kidney Disease.   CALCIUM  9.4 05/05/2024   PHOS 5.0 (H) 05/04/2024   PROT 5.7 (L) 05/03/2024   ALBUMIN 2.5 (L)  05/03/2024   BILITOT 0.5 05/03/2024   ALKPHOS 58 05/03/2024   AST 25 05/03/2024   ALT 58 (H) 05/03/2024   ANIONGAP 13 05/05/2024    CBG (last 3)  Recent Labs    05/05/24 2123 05/06/24 0731 05/06/24 1126  GLUCAP 249* 169* 240*      Coagulation Profile: No results for input(s): INR, PROTIME in the last 168 hours.   Radiology Studies: I have personally reviewed the imaging studies  CT Angio Chest Pulmonary Embolism (PE) W or WO Contrast Result Date: 05/04/2024 CLINICAL DATA:  Stage IV non-small cell lung cancer, COPD, shortness of breath, chest pressure. * Tracking Code: BO * EXAM: CT ANGIOGRAPHY CHEST WITH CONTRAST TECHNIQUE: Multidetector CT imaging of the chest was performed using the standard protocol during bolus administration of intravenous contrast. Multiplanar CT image reconstructions and MIPs were obtained to evaluate the vascular anatomy. RADIATION DOSE REDUCTION: This exam was performed according to the departmental dose-optimization program which includes automated exposure control, adjustment of the mA and/or kV according to patient size and/or use of iterative reconstruction technique. CONTRAST:  75mL OMNIPAQUE  IOHEXOL  350 MG/ML SOLN COMPARISON:  04/01/2024 FINDINGS: Despite efforts by the technologist and patient, motion artifact is present on today's exam and could not be eliminated. This reduces exam sensitivity and specificity. Cardiovascular: No filling defect is identified in the pulmonary arterial tree to suggest pulmonary embolus. Coronary, aortic arch, and branch vessel atherosclerotic vascular disease. Borderline cardiomegaly. Mediastinum/Nodes: AP window lymph node 1.0 cm in short axis on image 32 series 4, stable. Stable mild right paratracheal and stable right hilar adenopathy. Mildly dilated distal esophagus with air-fluid level. Lungs/Pleura: Emphysema. Continued consolidation of the entire right lower lobe with worsening consolidation in the right middle lobe  and right  upper lobe. Extensive small nodules are present diffusely throughout both lungs, substantially increased in size and number from 04/19/2024, possibilities include heavy burden of lung metastatic disease versus fungal or atypical infection. No cavitation identified. Upper Abdomen: Contracted gallbladder. Atheromatous plaque at the origin of the celiac trunk with some narrowing of the celiac trunk which may be due to median arcuate ligament. Mild right retrocrural adenopathy. Musculoskeletal: Thoracic spondylosis. Lower cervical plate and screw fixator. Review of the MIP images confirms the above findings. IMPRESSION: 1. No filling defect is identified in the pulmonary arterial tree to suggest pulmonary embolus. Adversely affected negative predictive value due to motion artifact. 2. Continued consolidation of the entire right lower lobe with worsening and substantial consolidation in the right middle lobe and right upper lobe. 3. Extensive small nodules are present diffusely throughout both lungs, substantially increased in size and number from 04/19/2024, possibilities include heavy burden of lung metastatic disease versus fungal or atypical infection. 4. Stable mild mediastinal and right hilar adenopathy. 5. Mildly dilated distal esophagus with air-fluid level, suggesting reflux. 6. Aortic Atherosclerosis (ICD10-I70.0) and Emphysema (ICD10-J43.9). Electronically Signed   By: Freida Jes M.D.   On: 05/04/2024 16:45       Kiele Heavrin M.D. [facility] 05/06/2024, 2:04 PM  Available via Epic secure chat 7am-7pm After 7 pm, please refer to night coverage provider listed on amion.

## 2024-05-06 NOTE — TOC Initial Note (Signed)
 Transition of Care [facility]) - Initial/Assessment Note    Patient Details  Name: Jose Patrick MRN: [id number] Date of Birth: 1951/07/15  [facility] CM/SW Contact:    Loreda Rodriguez, RN Phone Number:[phone]  05/06/2024, 2:16 PM  Clinical Narrative:                 TOC following patient with high risk for readmission. CM at bedside introduced self and explained role. Patient and family members at bedside. Patient states that he is from home where he currently has home health services with [facility]. Patient has home O2 supplied through [place]. Patient Amos Balint report that he is active with [facility]. CM has verified patient is active with [facility]. Patient is active with [facility] and has access to affordable  TOC will continue to follow for disposition needs.     Barriers to Discharge: Continued Medical Work up   Patient Goals and CMS Choice Patient states their goals for this hospitalization and ongoing recovery are:: To return home with hospice to follow   Choice offered to / list presented to : NA [place] ownership interest in [facility].provided to::  (n/a)    Expected Discharge Plan and Services In-house Referral: NA Discharge Planning Services: CM Consult Post Acute Care Choice: Hospice Living arrangements for the past 2 months: Single Family Home                 DME Arranged: N/A (already has home o2) DME Agency: [facility] [place])       HH Arranged: PT, OT (Active with [facility]) HH Agency: Other - See comment [facility]) Date HH Agency Contacted: 05/06/24 Time HH Agency Contacted: 1412 Representative spoke with at [facility] Agency: Angie  Prior Living Arrangements/Services Living arrangements for the past 2 months: Single Family Home Lives with:: Spouse Patient language and need for interpreter reviewed:: Yes Do you feel safe going back to the place where you live?: Yes      Need for Family Participation in  Patient Care: Yes (Comment) Care giver support system in place?: Yes (comment) Current home services: DME (O2) Criminal Activity/Legal Involvement Pertinent to Current Situation/Hospitalization: No - Comment as needed  Activities of Daily Living   ADL Screening (condition at time of admission) Independently performs ADLs?: Yes (appropriate for developmental age) Is the patient deaf or have difficulty hearing?: Yes Does the patient have difficulty seeing, even when wearing glasses/contacts?: No Does the patient have difficulty concentrating, remembering, or making decisions?: No  Permission Sought/Granted Permission sought to share information with : Family Supports Permission granted to share information with : Yes, Verbal Permission Granted  Share Information with NAME: [facility]  Permission granted to share info w AGENCY: [facility] [place]  Permission granted to share info w Relationship: Spouse  Permission granted to share info w Contact Information: [phone]  Emotional Assessment Appearance:: Appears stated age Attitude/Demeanor/Rapport: Engaged, Gracious Affect (typically observed): Accepting, Pleasant Orientation: : Oriented to Self, Oriented to Place, Oriented to  Time, Oriented to Situation   Psych Involvement: No (comment)  Admission diagnosis:  Metastatic lung cancer (metastasis from lung to other site) [facility]) [C34.90] Hemoptysis [R04.2] Acute on chronic hypoxic respiratory failure (HCC) [J96.21] Patient Active Problem List   Diagnosis Date Noted   DNR (do not resuscitate) 05/04/2024   Need for emotional support 05/04/2024   Counseling and coordination of care 05/04/2024   Acute on chronic hypoxic respiratory failure (HCC) 05/04/2024   Palliative care encounter  05/04/2024   Medication management 05/04/2024   Metastatic lung cancer (metastasis from lung to other site) (HCC) 05/03/2024   Hypervolemia 05/03/2024   Bilateral pneumonia 04/19/2024   Acute  on chronic respiratory failure with hypoxia (HCC) 04/19/2024   Age-related cataract of both eyes 04/19/2024   Alcohol dependence, uncomplicated (HCC) 04/19/2024   Chronic kidney disease 04/19/2024   Class 2 obesity 04/19/2024   Chronic respiratory failure (HCC) 04/07/2024   Community acquired pneumonia 04/01/2024   Right lower lobe pulmonary nodule 01/26/2023   COPD with acute exacerbation (HCC) 01/26/2023   Hx of colonic polyps    Other dysphagia    Gastric erythema    Hypogonadism in male 04/02/2021   Pain involving joints of fingers of both hands 04/02/2021   Carpal tunnel syndrome, bilateral upper limbs 04/02/2021   Dry eye syndrome of bilateral lacrimal glands 04/02/2021   Dyspnea, unspecified 04/02/2021   Encounter for follow-up examination after completed treatment for malignant neoplasm 04/02/2021   Fractured dental restorative material without loss of material 04/02/2021   GERD (gastroesophageal reflux disease) 04/02/2021   Hormone replacement therapy 04/02/2021   Male erectile disorder (CODE) 04/02/2021   Nail dystrophy 04/02/2021   Observation and evaluation for other specified suspected conditions 04/02/2021   OSA (obstructive sleep apnea) 04/02/2021   Pain in joint involving ankle and foot 04/02/2021   Shoulder pain 04/02/2021   Pain in right hand 04/02/2021   Pain in right knee 04/02/2021   Rash and other nonspecific skin eruption 04/02/2021   Stiffness of right hand, not elsewhere classified 04/02/2021   Stress due to family tension 04/02/2021   Type 2 diabetes mellitus with diabetic neuropathy, unspecified (HCC) 04/02/2021   Vitamin B12 deficiency 04/02/2021   Vitamin D  deficiency 04/02/2021   Cervical radiculopathy due to degenerative joint disease of spine 08/23/2019   Abdominal bloating 05/10/2015   Colon polyps 05/10/2015   Rectal pain 05/10/2015   COPD with chronic bronchitis and emphysema (HCC) 01/22/2014   Schizoaffective disorder (HCC) 10/25/2013   S/P  lobectomy of lung 01/15/2013   Bipolar affective disorder (HCC) 09/30/2012   PTSD (post-traumatic stress disorder) 09/30/2012   Non-small cell lung cancer (HCC) 01/09/2012   Goals of care, counseling/discussion 03/31/2011   BENIGN PROSTATIC HYPERTROPHY 09/28/2010   Cough 05/10/2010   DEPRESSION, MAJOR 09/26/2009   Incontinence of feces 05/18/2009   Essential hypertension 03/20/2009   VERTIGO, INTERMITTENT 03/28/2008   Hyperlipidemia 10/08/2007   ERECTILE DYSFUNCTION 10/08/2007   Allergic rhinitis 10/08/2007   SPINAL STENOSIS, LUMBAR 10/08/2007   History of colonic polyps 10/08/2007   Anxiety state 08/10/2007   CONSTIPATION, CHRONIC, HX OF 08/10/2007   PCP:  Clinic, [name] Pharmacy:   [facility] - [place], [place] - [address] [place] [address] Phone: [phone] Fax: [phone]  [facility] [address] [place]), [place] - [address] [place] [address] Phone: [phone] Fax: [phone]     Social Drivers of Health (SDOH) Social History: SDOH Screenings   Food Insecurity: No Food Insecurity (05/04/2024)  Housing: Low Risk  (05/04/2024)  Transportation Needs: No Transportation Needs (05/04/2024)  Utilities: Not At Risk (05/04/2024)  Depression (PHQ2-9): Low Risk  (11/20/2021)  Financial Resource Strain: Low Risk  (12/02/2023)   Received from [facility]  Physical Activity: Unknown (03/17/2024)   Received from [facility]  Social Connections: Moderately Integrated (05/04/2024)  Stress: Stress Concern Present (03/08/2024)   Received from [facility]  Tobacco Use: Medium Risk (05/03/2024)   SDOH Interventions:     Readmission Risk Interventions    05/06/2024    1:46 PM 04/20/2024   11:08 AM 04/02/2024    1:08 PM  Readmission Risk Prevention Plan  Transportation Screening Complete Complete Complete  PCP or  Specialist Appt within 5-7 Days   Complete  PCP or Specialist Appt within 3-5 Days  Complete   Home Care Screening   Complete  Medication Review (RN CM)   Complete  HRI or Home Care Consult  Complete   Social Work Consult for Recovery Care Planning/Counseling  Complete   Palliative Care Screening  Not Applicable   Medication Review [employer]) Complete Complete   PCP or Specialist appointment within 3-5 days of discharge Complete    HRI or Home Care Consult Complete    SW Recovery Care/Counseling Consult Complete    Palliative Care Screening Complete    Skilled Nursing Facility Not Applicable

## 2024-05-06 NOTE — Progress Notes (Signed)
 NAME:  Jeanette Moffatt, MRN:  [id number], DOB:  [birth year], LOS: 2 ADMISSION DATE:  05/03/2024, CONSULTATION DATE:  05/05/24 REFERRING MD:  Dr Thelma Fire, CHIEF COMPLAINT:   Hemoptysis, productive cough, increased O2 requirement    History of Present Illness:  73 year old man who presented to [facility] 6/16 for SOB, increased O2 requirements and productive cough. 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 Recently admitted 02/2024 (PNA of RML/RLL and concern for central obstructing lesion and increased pulmonary nodules c/f metastatic disease) and 6/2-6/15 for acute-on-chronic respiratory failure with bronchoscopy 6/5 demonstrating NSCLC.    Patient states that he was discharged from [facility] 6/15. He was home < 12 hours when he had recurrence of SOB/chest pressure and required uptitration of his O2 from 2L to 4L, then eventually 6L. His wife brought him back to the ED where he was readmitted for hypoxia. He reports persistent fatigue, attributing this to recent hospitalization and illness as well as his cancer. He also reported bilateral leg swelling, which is improved with diuresis. He has had cough productive of thick, glue-like white sputum. Notes that he has desaturations to high 70s even with slow ambulation of a few steps to the bathroom and mentions that he takes a long time to recover after these episodes.    During his last admission, [place] underwent bronchoscopy with Dr. Baldwin Levee (LBP) which demonstrated multiple biopsies c/w widespread lung cancer/metastatic disease. Jasiyah was seen by Dr. Marguerita Shih with Oncology in an attempt to centralize his care here at [facility]; MRI Brain was requested for staging/rule out of metastases and was negative. PET scan was requested but not yet completed. CT Chest 6/17  demonstrated continued consolidation of the entire RLL with worsening and substantial consolidation of the RML/RUL; extensive small nodules noted diffusely throughout both lungs, substantially increased in size/number from scan 6/2, worrisome for heavy burden of metastatic lung disease versus fungal/atypical infection.    PCCM consulted in the setting of hypoxia with increased O2 requirement and worsened imaging findings.  Pertinent  Medical History   Past Medical History:  Diagnosis Date   ALLERGIC RHINITIS 10/08/2007   Qualifier: Diagnosis of  By: Autry Legions MD, Alveda Aures    BENIGN PROSTATIC HYPERTROPHY 09/28/2010   Qualifier: Diagnosis of  By: Autry Legions MD, Alveda Aures    Bipolar affective disorder (HCC) 09/30/2012   COLONIC POLYPS, HX OF 10/08/2007   Qualifier: Diagnosis of  By: Autry Legions MD, Alveda Aures    CONSTIPATION, CHRONIC, HX OF 08/10/2007   Qualifier: Diagnosis of  By: Jarold Merlin    DIABETES MELLITUS, TYPE II 10/08/2007   Qualifier: Diagnosis of  By: Autry Legions MD, Alveda Aures    ERECTILE DYSFUNCTION 10/08/2007   Qualifier: Diagnosis of  By: Autry Legions MD, Alveda Aures    HYPERLIPIDEMIA 10/08/2007   Qualifier: Diagnosis of  By: Autry Legions MD, Alveda Aures    HYPERTENSION 03/20/2009   Qualifier: Diagnosis of  By: Autry Legions MD, Alveda Aures    Non-small cell lung cancer (NSCLC) [facility])    Widespread bilateral pulmonary nodules   PTSD (post-traumatic stress disorder) 09/30/2012   SPINAL STENOSIS, LUMBAR 10/08/2007   Per Dr Larrie Po - [facility]   Qualifier: Diagnosis of  By: Autry Legions MD, Alveda Aures     [facility] Events: Including procedures, antibiotic start and stop dates in addition  to other pertinent events   6/16 - Presented to [facility] for ongoing SOB/fatigue, increased O2 needs. CTA Chest negative for PE, demonstrated continued consolidation of the entire RLL with worsening and substantial consolidation of the RML/RUL; extensive small nodules noted diffusely throughout both lungs, substantially increased in  size/number from scan 6/2, worrisome for heavy burden of metastatic lung disease versus fungal/atypical infection. 6/17 - PMT consulted. 6/18 - PCCM consulted.  No overnight events Feels a little bit better today  Interim History / Subjective:   No overnight events, feels a little bit better today Still oxygen supplementation  Objective    Blood pressure 134/66, pulse 65, temperature (!) 97.3 F (36.3 C), temperature source Oral, resp. rate 14, height 5' 8 (1.727 m), weight 109.6 kg, SpO2 97%.        Intake/Output Summary (Last 24 hours) at 05/06/2024 0738 Last data filed at 05/05/2024 1338 Gross per 24 hour  Intake 480 ml  Output --  Net 480 ml   Filed Weights   05/04/24 0712 05/05/24 0706 05/06/24 0512  Weight: 104.3 kg 108.7 kg 109.6 kg    Examination: General: Chronically ill-appearing HENT: Moist oral mucosa Lungs: Decreased air movement bilaterally worse on the right Cardiovascular: S1-S2 appreciated Abdomen: Soft, bowel sounds appreciated  I reviewed nursing notes, hospitalist notes, last 24 h vitals and pain scores, last 48 h intake and output, last 24 h labs and trends, and last 24 h imaging results.  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

## 2024-05-06 NOTE — Progress Notes (Signed)
 Daily Progress Note   Patient Name: Jose Patrick       Date: 05/06/2024 DOB: 22-May-1951  Age: 73 y.o. MRN#: [id number] Attending Physician: Loma Rising, MD Primary Care Physician: [name] Admit Date: 05/03/2024 Length of Stay: 2 days  Reason for Consultation/Follow-up: Establishing goals of care  Subjective:   CC: Patient sitting up by window discussing care with PCCM provider.  Following up regarding complex medical decision making and symptom management.  Subjective:  Reviewed EMR prior to presenting to bedside.  At time of EMR review in past 24 hours patient has received as needed oxycodone  2.5 mg x 2 doses.  Patient also receiving increased dose of gabapentin  400 mg 3 times daily. Reviewed recent documentation from [facility] provider.  Presented to bedside to meet with patient.  Patient sitting up near window discussing care with PCCM provider.  Patient's grandson also present at bedside.  Able to join conversation to discuss care planning moving forward.  Patient placed his wife, Jose Patrick, on speaker phone for conversation.  PCCM provider reviewed patient's imaging and progression of cancer in the lungs.  Noted awaiting Dr. Bing Buff input regarding appropriateness of cancer directed therapies.  PCCM provider and this palliative medicine provider also expressed concern that patient may not be appropriate for cancer directed therapies and then would need to focus on symptom management at end-of-life.  Had already discussed with patient previously though again reviewed that should patient not be a candidate for further cancer directed therapies, would mean getting hospice support at home.  Patient acknowledged this.  Patient again reiterated that he would not want to actually die at home so I again discussed if patient already had hospice set up at home and was reaching end-of-life, could potentially be a candidate for inpatient hospice for severe symptom management in setting  of his worsening shortness of breath.  Patient acknowledged this.  Noted would determine further planning once patient had chance to speak with oncologist about cancer directed therapies.  Hopefully oncology will be able to meet with patient today.   All questions answered at that time.  Noted palliative medicine team to continue to follow patient's medical journey.  Objective:   Vital Signs:  BP 134/66 (BP Location: Right Arm)   Pulse 65   Temp (!) 97.3 F (36.3 C) (Oral)   Resp 14   Ht 5' 8 (1.727 m)   Wt 109.6 kg   SpO2 97%   BMI 36.74 kg/m   Physical Exam: General: NAD, alert, pleasant, chronically ill-appearing Cardiovascular: RRR Respiratory: no increased work of breathing noted, not in respiratory distress Neuro: A&Ox4, following commands easily Psych: appropriately answers all questions  Assessment & Plan:   Assessment: Patient 73 year old male with past medical history of recently diagnosed stage IV non-small cell lung cancer with associated pleural effusion suspected to be malignant, COPD, OSA on CPAP at bedtime, CAD, hypertension, diabetes, GERD, BPH, mood disorder, and HFpEF who was admitted on 05/03/2024 for management of worsening shortness of breath and chest pressure.  Patient has had multiple hospitalizations recently for management of pneumonia and acute on chronic hypoxic respiratory failure.  Since admission, patient has received management for acute on chronic hypoxic respiratory failure and mild exacerbation of HFpEF.  Palliative medicine team consulted to assist with complex medical decision making and symptom management.   Recommendations/Plan: # Complex medical decision making/goals of care:      - Discussed care with patient with grandson at bedside and wife on speaker phone as  detailed above in HPI.  PCCM provider involved in conversation as well.  Patient has stage IV non-small cell lung cancer which has continued to progress.  At this point oncology has  been requested to discuss with patient if he would be appropriate for cancer directed therapies in outpatient setting; awaiting oncology input.  Already discussed that if patient does not on appropriate candidate for cancer directed therapies, would then be time to go home with hospice support.  Patient and wife acknowledged this. Palliative medicine team continue to follow along and engage in conversations as able and appropriate.                - Patient has stated that when his time is short, he does not want to die at home..  Noted if patient having worsening symptom management and needing appropriate medications at end-of-life with home hospice already involved, could potentially coordinate with hospice transfer to inpatient hospice so that he could die at a hospice facility.                -  Code Status: Limited: Do not attempt resuscitation (DNR) -DNR-LIMITED -Do Not Intubate/DNI    # Symptom management Patient is receiving these palliative interventions for symptom management with an intent to improve quality of life.                 - Shortness of breath/cough, in setting of stage IV non-small cell lung cancer and COPD                               - Continue oxycodone  2.5 mg every 4 hours as needed.  Will continue to adjust based on patient's symptom response.    - Start saline nebulizers to assist with mucus congestion   - Continue gabapentin  400 mg 3 times daily   # Psycho-social/Spiritual Support:  - Support System: Wife, daughter   # Discharge Planning:  To Be Determined  Discussed with: Patient, patient's wife, patient's grandson, PCCM provider,  Thank you for allowing the palliative care team to participate in the care [name].  [name], DO Palliative Care Provider PMT # [phone]  If patient remains symptomatic despite maximum doses, please call PMT at [phone] between 0700 and 1900. Outside of these hours, please call attending, as PMT does not have  night coverage.  Personally spent 40 minutes in patient care including extensive chart review (labs, imaging, progress/consult notes, vital signs), medically appropraite exam, discussed with treatment team, education to patient, family, and staff, documenting clinical information, medication review and management, coordination of care, and available advanced directive documents.

## 2024-05-06 NOTE — Inpatient Diabetes Management (Signed)
 Inpatient Diabetes Program Recommendations  AACE/ADA: New Consensus Statement on Inpatient Glycemic Control (2015)  Target Ranges:  Prepandial:   less than 140 mg/dL      Peak postprandial:   less than 180 mg/dL (1-2 hours)      Critically ill patients:  140 - 180 mg/dL   Lab Results  Component Value Date   GLUCAP 169 (H) 05/06/2024   HGBA1C 7.8 (H) 04/03/2024    Review of Glycemic Control  Diabetes history: DM2 Outpatient Diabetes medications: Lantus  20-30 units at bedtime, Jardiance  25 mg daily, metformin  1000 BID, Ozempic  2 mg weekly Current orders for Inpatient glycemic control: Semglee  35 at bedtime, Novolog  0-15 TID + 12 units TID, Solumedrol 60 Q12H  HgbA1C 7.8% CBGs this am: 169  Inpatient Diabetes Program Recommendations:    Agree with orders.   Will follow glucose trends daily.  Thank you. Joni Net, RD, LDN, CDCES Inpatient Diabetes Coordinator [phone]

## 2024-05-07 DIAGNOSIS — C3490 Malignant neoplasm of unspecified part of unspecified bronchus or lung: Secondary | ICD-10-CM | POA: Diagnosis not present

## 2024-05-07 DIAGNOSIS — J9621 Acute and chronic respiratory failure with hypoxia: Secondary | ICD-10-CM | POA: Diagnosis not present

## 2024-05-07 DIAGNOSIS — R042 Hemoptysis: Secondary | ICD-10-CM | POA: Diagnosis not present

## 2024-05-07 DIAGNOSIS — Z79899 Other long term (current) drug therapy: Secondary | ICD-10-CM | POA: Diagnosis not present

## 2024-05-07 DIAGNOSIS — C3411 Malignant neoplasm of upper lobe, right bronchus or lung: Secondary | ICD-10-CM | POA: Diagnosis not present

## 2024-05-07 DIAGNOSIS — Z515 Encounter for palliative care: Secondary | ICD-10-CM | POA: Diagnosis not present

## 2024-05-07 DIAGNOSIS — Z7189 Other specified counseling: Secondary | ICD-10-CM | POA: Diagnosis not present

## 2024-05-07 LAB — GLUCOSE, CAPILLARY
Glucose-Capillary: 147 mg/dL — ABNORMAL HIGH (ref 70–99)
Glucose-Capillary: 302 mg/dL — ABNORMAL HIGH (ref 70–99)
Glucose-Capillary: 200 mg/dL — ABNORMAL HIGH (ref 70–99)
Glucose-Capillary: 262 mg/dL — ABNORMAL HIGH (ref 70–99)

## 2024-05-07 MED ORDER — INSULIN GLARGINE-YFGN 100 UNIT/ML ~~LOC~~ SOLN
40.0000 [IU] | Freq: Every day | SUBCUTANEOUS | Status: DC
  Administered 2024-05-07: 40 [IU] via SUBCUTANEOUS
  Filled 2024-05-07 (×2): qty 0.4

## 2024-05-07 MED ORDER — BUDESONIDE 0.5 MG/2ML IN SUSP
0.5000 mg | Freq: Two times a day (BID) | RESPIRATORY_TRACT | Status: DC
  Administered 2024-05-07 – 2024-05-08 (×2): 0.5 mg via RESPIRATORY_TRACT
  Filled 2024-05-07 (×2): qty 2

## 2024-05-07 MED ORDER — METHYLPREDNISOLONE SODIUM SUCC 40 MG IJ SOLR
40.0000 mg | Freq: Two times a day (BID) | INTRAMUSCULAR | Status: DC
  Administered 2024-05-07 – 2024-05-08 (×2): 40 mg via INTRAVENOUS
  Filled 2024-05-07 (×2): qty 1

## 2024-05-07 MED ORDER — FUROSEMIDE 10 MG/ML IJ SOLN
40.0000 mg | Freq: Two times a day (BID) | INTRAMUSCULAR | Status: DC
  Administered 2024-05-07 – 2024-05-08 (×3): 40 mg via INTRAVENOUS
  Filled 2024-05-07 (×3): qty 4

## 2024-05-07 NOTE — Evaluation (Addendum)
 Physical Therapy Evaluation Patient Details Name: Jose Patrick MRN: [id number] DOB: Jan 09, 1951 Today's Date: 05/07/2024  History of Present Illness  Jose Patrick is a  73 y.o. 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 He returns day after discharge with sudden worsening in his breathing. Reports was only using 2L O2 at home despite being Rx 4L at discharge. Ultimately due to symptoms increased to 2L -> 4L -> 6L. Admitted with ARF and palliative c/s for GOC  Clinical Impression  Pt admitted with above diagnosis. Pt ambulated 150' with RW, SpO2 87% on 4L O2, 90% on 6L O2 while walking. SpO2 95% at rest on 5L O2. Pt required ongoing verbal cues for pursed lipped breathing. Pitting edema noted BLEs, encouraged pt to elevate LEs and to perform ankle pumps.  Pt reports he has needed level of assistance and needed DME at home. He is ready to DC home from a PT standpoint. Pt currently with functional limitations due to the deficits listed below (see PT Problem List). Pt will benefit from acute skilled PT to increase their independence and safety with mobility to allow discharge.           If plan is discharge home, recommend the following: Assistance with cooking/housework;Help with stairs or ramp for entrance;Assist for transportation   Can travel by private vehicle        Equipment Recommendations None recommended by PT  Recommendations for Other Services       Functional Status Assessment Patient has had a recent decline in their functional status and demonstrates the ability  to make significant improvements in function in a reasonable and predictable amount of time.     Precautions / Restrictions Precautions Precautions: Fall Precaution/Restrictions Comments: O2 dep @ baseline Restrictions Weight Bearing Restrictions Per Provider Order: No      Mobility  Bed Mobility               General bed mobility comments: up in recliner    Transfers Overall transfer level: Needs assistance Equipment used: Rolling walker (2 wheels) Transfers: Sit to/from Stand Sit to Stand: Supervision           General transfer comment: VCs hand placement and increased effort    Ambulation/Gait Ambulation/Gait assistance: Supervision Gait Distance (Feet): 140 Feet Assistive device: Rolling walker (2 wheels) Gait Pattern/deviations: Step-through pattern, Decreased stride length Gait velocity: decreased     General Gait Details: steady with RW, pt had 1 minor episode of unsteadiness while turning but maintained balance using RW; SpO2 87% on 4L O2 walking, 90% on 6L O2 walking, with ongoing VCs for PLB. SpO2 95% on 5L O2 [place] at rest, 3/4 dyspnea with walking  Stairs            Wheelchair Mobility     Tilt Bed    Modified Rankin (Stroke Patients Only)       Balance Overall balance assessment: Needs assistance Sitting-balance support: No upper extremity supported Sitting balance-Leahy Scale: Normal     Standing balance support: No upper extremity supported, Single extremity supported Standing balance-Leahy Scale: Fair  Pertinent Vitals/Pain Pain Assessment Pain Assessment: No/denies pain Pain Score: 0-No pain    Home Living Family/patient expects to be discharged to:: Private residence Living Arrangements: Spouse/significant other Available Help at Discharge: Family Type of Home: House Home Access: Stairs to enter Entrance Stairs-Rails: Right Entrance Stairs-Number of Steps: 4   Home Layout:  One level;Laundry or work area in [employer]: Rollator (4 wheels);Other (comment);Adaptive equipment;Wheelchair - power (getting a power w/c via [place]) Additional Comments: requesting a comfort height toilet thru [facility]    Prior Function Prior Level of Function : Independent/Modified Independent             Mobility Comments: now using rollator with O2 holder, stated he mostly holds onto walls at home when walking, denies falls in past 6 months ADLs Comments: sponge bathes, mod I BADL's with O@, family assists with all IADL's     Extremity/Trunk Assessment   Upper Extremity Assessment Upper Extremity Assessment: Defer to OT evaluation    Lower Extremity Assessment Lower Extremity Assessment: RLE deficits/detail;LLE deficits/detail RLE Deficits / Details: knee ext +3/5 RLE Sensation: history of peripheral neuropathy;decreased light touch LLE Deficits / Details: knee ext +3/5 LLE Sensation: decreased light touch;history of peripheral neuropathy   Pitting edema noted BLEs.   Cervical / Trunk Assessment Cervical / Trunk Assessment: Normal  Communication   Communication Communication: No apparent difficulties    Cognition Arousal: Alert Behavior During Therapy: WFL for tasks assessed/performed   PT - Cognitive impairments: No apparent impairments                       PT - Cognition Comments: AxO x 3 pleasant and willing Following commands: Intact       Cueing Cueing Techniques: Verbal cues     General Comments General comments (skin integrity, edema, etc.): 4 ltrs at 93% SpO2 at rest then 89% after amb to and from bathroom    Exercises     Assessment/Plan    PT Assessment Patient needs continued PT services  PT Problem List Decreased activity tolerance;Decreased balance;Decreased mobility;Cardiopulmonary status limiting activity       PT Treatment Interventions DME instruction;Gait training;Functional mobility training;Therapeutic  activities;Therapeutic exercise;Patient/family education;Balance training    PT Goals (Current goals can be found in the Care Plan section)  Acute Rehab PT Goals Patient Stated Goal: continue to get better and get home, likes to sit on front porch in rocking chair PT Goal Formulation: With patient Time For Goal Achievement: 05/21/24 Potential to Achieve Goals: Good    Frequency Min 3X/week     Co-evaluation               AM-PAC PT 6 Clicks Mobility  Outcome Measure Help needed turning from your back to your side while in a flat bed without using bedrails?: None Help needed moving from lying on your back to sitting on the side of a flat bed without using bedrails?: A Little Help needed moving to and from a bed to a chair (including a wheelchair)?: None Help needed standing up from a chair using your arms (e.g., wheelchair or bedside chair)?: None Help needed to walk in hospital room?: None Help needed climbing 3-5 steps with a railing? : A Little 6 Click Score: 22    End of Session Equipment Utilized During Treatment: Gait belt;Oxygen Activity Tolerance: Patient tolerated treatment well Patient left: in chair;with call bell/phone within reach;with family/visitor present Nurse Communication: Mobility status PT Visit Diagnosis: Unsteadiness on  feet (R26.81);Difficulty in walking, not elsewhere classified (R26.2)    Time: 1308-6578 PT Time Calculation (min) (ACUTE ONLY): 24 min   Charges:   PT Evaluation $PT Eval Moderate Complexity: 1 Mod PT Treatments $Gait Training: 8-22 mins PT General Charges $$ ACUTE PT VISIT: 1 Visit         Daymon Evans PT 05/07/2024  Acute Rehabilitation Services  Office [phone]

## 2024-05-07 NOTE — Plan of Care (Signed)

## 2024-05-07 NOTE — Progress Notes (Signed)
 I reached out to Abby Hocking, NN at [facility]. I asked in the pt has an authorized CC referral to [facility]. Cain Castillo states that the pt currently does not have a referral to medical oncology. Cain Castillo states that he will need to [facility] Dr. Farrell Honey, who will decide if a CC referral is appropriate. I verbalized understanding.

## 2024-05-07 NOTE — Progress Notes (Signed)
 Daily Progress Note   Patient Name: Jose Patrick       Date: 05/07/2024 DOB: 07/22/1951  Age: 73 y.o. MRN#: [id number] Attending Physician: Loma Rising, MD Primary Care Physician: [name] Admit Date: 05/03/2024 Length of Stay: 3 days  Reason for Consultation/Follow-up: Establishing goals of care  Subjective:   CC: Patient sitting up by window enjoying time with visitors r.  Following up regarding complex medical decision making and symptom management.  Subjective:  Reviewed EMR prior to presenting to bedside.  Reviewed recent documentation from [facility] provider, hospitalist, and oncologist.  Oncologist discussed with patient possible therapy of palliative systemic chemotherapy or potential immunotherapy.  Patient would like to follow-up in the outpatient setting with oncologist to pursue possible cancer directed therapies.  Presented bedside to meet with patient.  Patient seen sitting up by window enjoying time with visitors from his chart.  Patient gave permission to continue conversation with visitors present.  Inquired about patient's symptom burden today.  Patient continues to feel the as needed oxycodone  assist with his shortness of breath.  Patient also feels nebulizers are assisting with his breathing.  Patient noted that he has discussed care with multiple providers.  He acknowledges that he has metastatic cancer and if he becomes an appropriate for cancer directed therapies, would transition to hospice.  At this time patient wants to follow-up with oncologist in outpatient setting to pursue cancer directed therapies if offered.  Noted have already referred patient to outpatient palliative medicine team at [facility].  Spent time providing emotional support via active listening.  All questions answered at that time.  Noted palliative medicine team continue to follow along with patient's medical journey.  Discussed care with hospitalist and RN to coordinate.  Objective:    Vital Signs:  BP (!) 132/56 (BP Location: Left Arm)   Pulse 64   Temp 98.2 F (36.8 C) (Oral)   Resp 14   Ht 5' 8 (1.727 m)   Wt 109.6 kg   SpO2 92%   BMI 36.74 kg/m   Physical Exam: General: NAD, alert, pleasant, chronically ill-appearing Cardiovascular: RRR Respiratory: no increased work of breathing noted, not in respiratory distress Neuro: A&Ox4, following commands easily Psych: appropriately answers all questions  Assessment & Plan:   Assessment: Patient 73 year old male with past medical history of recently diagnosed stage IV non-small cell lung cancer with associated pleural effusion suspected to be malignant, COPD, OSA on CPAP at bedtime, CAD, hypertension, diabetes, GERD, BPH, mood disorder, and HFpEF who was admitted on 05/03/2024 for management of worsening shortness of breath and chest pressure.  Patient has had multiple hospitalizations recently for management of pneumonia and acute on chronic hypoxic respiratory failure.  Since admission, patient has received management for acute on chronic hypoxic respiratory failure and mild exacerbation of HFpEF.  Palliative medicine team consulted to assist with complex medical decision making and symptom management.   Recommendations/Plan: # Complex medical decision making/goals of care:      - Discussed care with patient as detailed above in HPI.  PCCM provider involved in conversation as well.  Patient has stage IV non-small cell lung cancer which has continued to progress.  Oncology met with patient on 05/06/2024 and as reviewed possible cancer directed therapies being offered in outpatient setting.  Patient plans to follow-up with oncologist to pursue cancer directed therapies.  Patient has been referred to outpatient palliative medicine team at [facility] for follow-up.  Have already had multiple discussions with patient that should he  no longer be a candidate for cancer directed therapies, would recommend transition to hospice care at  home. Palliative medicine team continue to follow along and engage in conversations as able and appropriate.                - Patient has stated that when his time is short, he does not want to die at home. Noted if patient having worsening symptom management and needing appropriate medications at end-of-life with home hospice already involved, could potentially coordinate with hospice transfer to inpatient hospice so that he could die at a hospice facility.                -  Code Status: Limited: Do not attempt resuscitation (DNR) -DNR-LIMITED -Do Not Intubate/DNI    # Symptom management Patient is receiving these palliative interventions for symptom management with an intent to improve quality of life.                 - Shortness of breath/cough, in setting of stage IV non-small cell lung cancer and COPD                               - Continue oxycodone  2.5 mg every 4 hours as needed.  Will continue to adjust based on patient's symptom response.    - Continue saline nebulizers to assist with mucus congestion   - Continue gabapentin  400 mg 3 times daily   # Psycho-social/Spiritual Support:  - Support System: Wife, daughter, grrandson   # Discharge Planning:  Home with outpatient PM[facility] follow[facility]  Discussed with: Patient, hospitalist, RN Thank you for allowing the palliative care team to participate in the care Jose Patrick.  Jose Libel, DO Palliative Care Provider PMT # [phone]  If patient remains symptomatic despite maximum doses, please call PMT at [phone] between 0700 and 1900. Outside of these hours, please call attending, as PMT does not have night coverage.  Personally spent 35 minutes in patient care including extensive chart review (labs, imaging, progress/consult notes, vital signs), medically appropraite exam, discussed with treatment team, education to patient, family, and staff, documenting clinical information, medication review and management,  coordination of care, and available advanced directive documents.

## 2024-05-07 NOTE — Progress Notes (Addendum)
 [facility]                                                                              Jose Patrick, is a 73 y.o. male, DOB - 02-28-1951, ZOX:[id number] Admit date - 05/03/2024    Outpatient Primary MD for the patient is [name]  LOS - 3  days  Chief Complaint  Patient presents with   Shortness of Breath       Brief summary   Patient is a 73 y.o. 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 He returns day after discharge with sudden worsening in his breathing. Reports was only using 2L O2 at home despite being Rx 4L at discharge. Ultimately due to symptoms increased to 2L -> 4L -> 6L. While at rest had significant SOB and associated chest pressure. Feels like his lungs are being compressed. He is having thick tenacious sputum described like white glue. And also scant hemoptysis (very small amount of blood tinged sputum at bedside reports similar to that at home).     Assessment & Plan    Acute on chronic hypoxic respiratory failure, outpatient on 4L O2  Stage IV NSCLC -seen by Dr. Liam Redhead last admission and recommended an outpatient PET scan which has not yet to be done Bilateral pleural effusion L > R, symptomatic  -  recent diagnosis Stage IV NSCLC with Bronchoscopy and Bx 6/5 consistent with NSCLC throughout the R lung; pathology consistent with adenocarcinoma. Additional lung nodules noted in the L lung. Bilateral effusion also suspect metastatic.  -  Did have recent rhinovirus pneumonia 6/2, BAL Culture 6/5 no growth.  - IR consulted  for thoracentesis, no significant fluid for tap - s/p TXA nebs, continue IV Solu-Medrol , bronchodilators, I-S, flutter valve - Palliative medicine consulted. - CTA chest negative for pulmonary embolism, consolidation of the entire right lower lobe with worsening insubstantial consolidation in the right middle lobe and right upper lobe, extensive small nodules diffusely throughout the both lungs, increased in size and number from 04/19/2024 (heavy burden of lung metastatic disease versus fungal or atypical infection). - Pulmonology consulted, unfortunately not much to offer, recommended continue current management and oncology evaluation.  Palliative medicine following. - Hemoptysis resolved, feeling better, wean O2 as tolerated.   - Taper IV Solu-Medrol , transition to p.o. prednisone  tomorrow - Appreciate oncology follow-up by Dr. Marguerita Shih, plan outpatient workup and management, patient agreement, he will also [facility].      Acute on chronic diastolic CHF HFpEF Mild Hypervolemia  - Last TTE 9/'24 with LVEF 55-60%, mild cLVH, grade 1 diastolic dysfunction. Despite relative low BNP has increased and may be low related to weight. Has significant lower extremity edema. - The patient, shortness of breath and lower extremity edema is  improving, still having occasional episodes of increased work of breathing -2D echo showed EF of 55 to 60%, indeterminate diastolic parameters, RV SF -I's and O's with 1.2 L still positive, increased Lasix  to 40 mg IV daily will today, will reassess in a.m.   DM type II uncontrolled with with hyperglycemia - Hemoglobin A1c 7.8 on 04/03/2024  CBG (last 3)  Recent Labs    05/06/24 2101 05/07/24 0752 05/07/24 1205  GLUCAP 246* 147* 302*   - CBGs uncontrolled likely due to steroids, tapering IV Solu-Medrol  -Increase Semglee  40 units  qhs, NovoLog  12 units 3 times daily AC, continue moderate SSI   OSA: Per patient, he has BiPAP at home  CAD,  chronic angina / HLD:  - Aspirin  had been discontinued last admission. -  Continue home atorvastatin , Zetia , metoprolol , ranolazine .    HTN: -  Continue home Metoprolol . - Hold home Losartan  as BP reasonably controlled   GERD:  - Continue famotidine , PPI   BPH:  - Continue finasteride     Bipolar d/o, PTSD: noted not on treatment   Obesity type II Estimated body mass index is 36.74 kg/m as calculated from the following:   Height as of this encounter: 5' 8 (1.727 m).   Weight as of this encounter: 109.6 kg.  Code Status: DNR DVT Prophylaxis:  SCDs Start: 05/03/24 1948   Level of Care: Level of care: Telemetry Family Communication: Patient's sister, Jose Patrick on the speaker phone during examination on 6-19.  Patient alert and oriented Disposition Plan:      Remains inpatient appropriate:   Possible DC tomorrow if continues to improve.   Procedures:    Consultants:    oncology PCCM  Palliative medicine  Antimicrobials:   Anti-infectives (From admission, onward)    None          Medications  arformoterol   15 mcg Nebulization BID   artificial tears  1 drop Both Eyes Daily   atorvastatin   40 mg Oral QPM   budesonide  (PULMICORT ) nebulizer solution  0.5 mg Nebulization BID   cycloSPORINE  1 drop Both Eyes BID   ezetimibe   10 mg Oral Daily   famotidine   20 mg Oral QHS   finasteride   5 mg Oral Daily   furosemide   40 mg Intravenous BID   gabapentin   400 mg Oral TID   guaiFENesin   1,200 mg Oral BID   insulin  aspart  0-15 Units Subcutaneous TID WC   insulin  aspart  12 Units Subcutaneous TID WC   insulin  glargine-yfgn  35 Units Subcutaneous QHS   ipratropium-albuterol   3 mL Nebulization QID   methylPREDNISolone  (SOLU-MEDROL ) injection  60 mg Intravenous Q12H   metoprolol  succinate  100 mg Oral Daily   pantoprazole   40 mg Oral QAC breakfast   ranolazine   500 mg Oral BID   revefenacin   175 mcg Nebulization Daily   sodium chloride  HYPERTONIC  4 mL Nebulization BID       Subjective:   Jose Patrick was seen and examined today.  Receiving breathing treatment at the time of my encounter.  Patient feels a whole lot better from the time of admission, states hemoptysis has cleared.  Occasionally has episodes of shortness of breath but mostly improving.  Lower extremity edema improving   Vitals:   05/07/24 0300 05/07/24 0536 05/07/24 0731 05/07/24 1124  BP:  (!) 132/56    Pulse:  64    Resp:  14    Temp:  98.2 F (36.8 C)    TempSrc:  Oral    SpO2: 93% 92% 92% 97%  Weight:  109.6 kg    Height:        Intake/Output Summary (Last 24 hours) at 05/07/2024 1258 Last data filed at 05/07/2024 0801 Gross per 24 hour  Intake 600 ml  Output --  Net 600 ml     Wt Readings from Last 3 Encounters:  05/07/24 109.6 kg  04/23/24 89.5 kg  04/05/24 108.3 kg    Physical Exam General: Alert and oriented x 3, NAD Cardiovascular: S1 S2 clear, RRR.  Respiratory: Mild scattered wheezing Gastrointestinal: Soft, nontender, nondistended, NBS Ext: 1+ pedal edema bilaterally Neuro: no new deficits Psych: Normal affect    Data Reviewed:  I have personally reviewed following labs    CBC Lab Results  Component Value Date   WBC 18.5 (H) 05/05/2024   RBC 5.50 05/05/2024   HGB 14.4 05/05/2024   HCT 45.8 05/05/2024   MCV 83.3 05/05/2024   MCH 26.2 05/05/2024   PLT 265 05/05/2024   MCHC 31.4 05/05/2024   RDW 15.6 (H) 05/05/2024   LYMPHSABS 0.8 05/03/2024   MONOABS 0.5 05/03/2024   EOSABS 0.2 05/03/2024   BASOSABS 0.0 05/03/2024     Last metabolic panel Lab Results  Component Value Date   NA 136 05/05/2024   K 4.9 05/05/2024   CL 95 (L) 05/05/2024   CO2 28 05/05/2024   BUN 38 (H) 05/05/2024   CREATININE 1.14 05/05/2024   GLUCOSE 273 (H) 05/05/2024   GFRNONAA >60 05/05/2024   GFRAA  02/23/2009    >60        The eGFR has been calculated using the MDRD equation. This calculation has not been validated in all clinical situations. eGFR's  persistently <60 mL/min signify possible Chronic Kidney Disease.   CALCIUM  9.4 05/05/2024   PHOS 5.0 (H) 05/04/2024   PROT 5.7 (L) 05/03/2024   ALBUMIN 2.5 (L) 05/03/2024   BILITOT 0.5 05/03/2024   ALKPHOS 58 05/03/2024   AST 25 05/03/2024   ALT 58 (H) 05/03/2024   ANIONGAP 13 05/05/2024    CBG (last 3)  Recent Labs    05/06/24 2101 05/07/24 0752 05/07/24 1205  GLUCAP 246* 147* 302*      Coagulation Profile: No results for input(s): INR, PROTIME in the last 168 hours.   Radiology Studies: I have personally reviewed the imaging studies  No results found.      Jose Patrick M.D. [facility] 05/07/2024, 12:58 PM  Available via Epic secure chat 7am-7pm After 7 pm, please refer to night coverage provider listed on amion.

## 2024-05-07 NOTE — Progress Notes (Signed)
 Pt waking up in panic exclaiming I can't breathe. Pt removed CPAP because he said he couldn't breathe. Replaced face mask for pt to hold and checked saturation. Pt saturating at 93%. Nasal cannula placed, pt given melatonin and 2.5mg  oxycodone  and reassurance that he is getting enough oxygen, but may be having anxiety. Afterwards, pt calm and thankful. Will relay anxiety to day team.

## 2024-05-07 NOTE — Evaluation (Signed)
 Occupational Therapy Evaluation Patient Details Name: Jose Patrick MRN: [id number] DOB: 09-08-1951 Today's Date: 05/07/2024   History of Present Illness   Jose Patrick is a  73 y.o. 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 He returns day after discharge with sudden worsening in his breathing. Reports was only using 2L O2 at home despite being Rx 4L at discharge. Ultimately due to symptoms increased to 2L -> 4L -> 6L. Admitted with ARF and palliative c/s for GOC     Clinical Impressions PTA, patient was living home with wife and relatively mod I with O2 at baseline amb in home without AD but awaiting a PWC from [place] due to respiratory status and has a stair lift. Currently, patient presents with deficits outlined below (see OT Problem list for details) most significantly pain, decreased activity tolerance with O2 dep, generalized muscle weakness, balance and mild STM deficits impacting ADL's and mobility. Wife Jose Patrick on phone on speaker during session to participate. Patient requesting [name] for discharge and reports family to assist with HHOT recommended. Patient will require ongoing skilled Ot while in acute care setting.      If plan is discharge home, recommend the following:   A little help with walking and/or transfers;A little help with bathing/dressing/bathroom;Assistance with cooking/housework;Direct supervision/assist for medications management;Direct supervision/assist for financial management;Assist for transportation;Help with stairs or ramp for entrance;Supervision  due to cognitive status     Functional Status Assessment   Patient has had a recent decline in their functional status and demonstrates the ability to make significant improvements in function in a reasonable and predictable amount of time.     Equipment Recommendations   BSC/3in1      Precautions/Restrictions   Precautions Precautions: Fall Precaution/Restrictions Comments: O2 dep @ baseline Restrictions Weight Bearing Restrictions Per Provider Order: No     Mobility Bed Mobility Overal bed mobility:  (was up in window sofa and remained)                  Transfers Overall transfer level: Needs assistance Equipment used: Rolling walker (2 wheels) Transfers: Sit to/from Stand, Bed to chair/wheelchair/BSC Sit to Stand: Supervision     Step pivot transfers: Contact guard assist     General transfer comment: VCs hand placement and increased effort and amb to and from bathroom with CGA      Balance Overall balance assessment: Needs assistance Sitting-balance support: No upper extremity supported Sitting balance-Leahy Scale: Normal     Standing balance support: Bilateral upper extremity supported, No upper extremity supported Standing balance-Leahy Scale: Fair Standing balance comment: RW to ambulate and negotiate O2 tubing and obstacles                           ADL either performed or assessed with clinical judgement   ADL Overall ADL's : Needs assistance/impaired Eating/Feeding: Independent;Sitting   Grooming: Wash/dry hands;Wash/dry face;Oral care;Sitting   Upper Body Bathing: Modified independent;Sitting   Lower Body Bathing: Supervison/ safety;Sit to/from stand   Upper Body Dressing : Independent;Sitting   Lower Body Dressing: Contact guard assist;Sit to/from stand  Toilet Transfer: Contact guard assist;Rolling walker (2 wheels);Regular Toilet;Grab bars   Toileting- Clothing Manipulation and Hygiene: Supervision/safety;Sit  to/from stand   Tub/ Engineer, structural:  (sponge bathes at baseline)   Functional mobility during ADLs: Contact guard assist;Rolling walker (2 wheels) General ADL Comments: Cues for breathing and pacing integration     Vision Baseline Vision/History: 1 Wears glasses;0 No visual deficits Ability to See in Adequate Light: 0 Adequate Patient Visual Report: No change from baseline Vision Assessment?: No apparent visual deficits;Wears glasses for reading            Pertinent Vitals/Pain Pain Assessment Pain Assessment: 0-10 Pain Score: 4  Pain Location: chest Pain Descriptors / Indicators: Tightness, Stabbing Pain Intervention(s): Monitored during session, Premedicated before session, Relaxation     Extremity/Trunk Assessment Upper Extremity Assessment Upper Extremity Assessment: Defer to OT evaluation   Lower Extremity Assessment Lower Extremity Assessment: RLE deficits/detail;LLE deficits/detail RLE Deficits / Details: knee ext +3/5, pitting edema noted BLEs (encouraged pt to elevate BLEs and to perform ankle pumps) RLE Sensation: history of peripheral neuropathy;decreased light touch LLE Deficits / Details: knee ext +3/5 LLE Sensation: decreased light touch;history of peripheral neuropathy   Cervical / Trunk Assessment Cervical / Trunk Assessment: Normal   Communication Communication Communication: No apparent difficulties   Cognition Arousal: Alert Behavior During Therapy: WFL for tasks assessed/performed Cognition: Cognition impaired   Orientation impairments: Time Awareness: Online awareness impaired Memory impairment (select all impairments): Short-term memory Attention impairment (select first level of impairment): Selective attention Executive functioning impairment (select all impairments): Sequencing, Problem solving OT - Cognition Comments: reports intermittent STM deficits                 Following commands: Intact       Cueing  General  Comments   Cueing Techniques: Verbal cues  4 ltrs at 93% SpO2 at rest then 89% after amb to and from bathroom, recovered to baseline with 1 min rest and cues for breathing           Home Living Family/patient expects to be discharged to:: Private residence Living Arrangements: Spouse/significant other Available Help at Discharge: Family Type of Home: House Home Access: Stairs to enter Secretary/administrator of Steps: 4 Entrance Stairs-Rails: Right Home Layout: One level;Laundry or work area in [employer] Shower/Tub: Sponge bathes at baseline   [employer]: Standard Bathroom Accessibility: No   Home Equipment: Rollator (4 wheels);Other (comment);Adaptive equipment;Wheelchair - power (getting a power w/c via [place]) Adaptive Equipment: Reacher Additional Comments: requesting a comfort height toilet thru [facility] and BSC      Prior Functioning/Environment Prior Level of Function : Independent/Modified Independent             Mobility Comments: now using rollator with O2 holder, stated he mostly holds onto walls at home when walking, denies falls in past 6 months ADLs Comments: sponge bathes, mod I BADL's with O@, family assists with all IADL's    OT Problem List: Decreased strength;Decreased activity tolerance;Impaired balance (sitting and/or standing);Decreased safety awareness;Decreased knowledge of precautions;Decreased knowledge of use of DME or AE;Cardiopulmonary status limiting activity   OT Treatment/Interventions: Self-care/ADL training;Therapeutic exercise;Neuromuscular education;Energy conservation;DME and/or AE instruction;Therapeutic activities;Cognitive remediation/compensation;Balance training;Patient/family education      OT Goals(Current goals can be found in the care plan section)   Acute Rehab OT Goals Patient Stated Goal: to go home with [facility] and get stronger OT Goal Formulation: With patient/family Time For Goal Achievement:  05/21/24 Potential to Achieve Goals:  Fair ADL Goals Pt Will Perform Lower Body Bathing: with supervision;sit to/from stand Pt Will Perform Lower Body Dressing: with supervision;sit to/from stand;with adaptive equipment Pt Will Transfer to Toilet: with supervision;ambulating;grab bars Pt Will Perform Toileting - Clothing Manipulation and hygiene: with set-up;with supervision Pt/caregiver will Perform Home Exercise Program: Increased strength;Both right and left upper extremity;Independently;With written HEP provided   OT Frequency:  Min 2X/week       AM-PAC OT 6 Clicks Daily Activity     Outcome Measure Help from another person eating meals?: None Help from another person taking care of personal grooming?: None Help from another person toileting, which includes using toliet, bedpan, or urinal?: A Little Help from another person bathing (including washing, rinsing, drying)?: A Little Help from another person to put on and taking off regular upper body clothing?: None Help from another person to put on and taking off regular lower body clothing?: A Little 6 Click Score: 21   End of Session Equipment Utilized During Treatment: Rolling walker (2 wheels);Gait belt;Oxygen Nurse Communication: Mobility status  Activity Tolerance: Patient limited by fatigue Patient left: in chair;with call bell/phone within reach;with nursing/sitter in room  OT Visit Diagnosis: Unsteadiness on feet (R26.81);Muscle weakness (generalized) (M62.81)                Time: 8756-4332 OT Time Calculation (min): 28 min Charges:  OT General Charges $OT Visit: 1 Visit OT Evaluation $OT Eval Low Complexity: 1 Low OT Treatments $Self Care/Home Management : 8-22 mins  Clair Alfieri OT/L Acute Rehabilitation Department  [phone]  05/07/2024, 12:57 PM

## 2024-05-08 ENCOUNTER — Other Ambulatory Visit (HOSPITAL_COMMUNITY): Payer: Self-pay

## 2024-05-08 DIAGNOSIS — E118 Type 2 diabetes mellitus with unspecified complications: Secondary | ICD-10-CM

## 2024-05-08 DIAGNOSIS — C3490 Malignant neoplasm of unspecified part of unspecified bronchus or lung: Secondary | ICD-10-CM | POA: Diagnosis not present

## 2024-05-08 DIAGNOSIS — R042 Hemoptysis: Secondary | ICD-10-CM | POA: Diagnosis not present

## 2024-05-08 DIAGNOSIS — J9621 Acute and chronic respiratory failure with hypoxia: Secondary | ICD-10-CM | POA: Diagnosis not present

## 2024-05-08 DIAGNOSIS — Z794 Long term (current) use of insulin: Secondary | ICD-10-CM

## 2024-05-08 LAB — GLUCOSE, CAPILLARY
Glucose-Capillary: 188 mg/dL — ABNORMAL HIGH (ref 70–99)
Glucose-Capillary: 279 mg/dL — ABNORMAL HIGH (ref 70–99)

## 2024-05-08 MED ORDER — PREDNISONE 10 MG PO TABS
ORAL_TABLET | ORAL | 0 refills | Status: DC
  Filled 2024-05-08: qty 40, 16d supply, fill #0

## 2024-05-08 MED ORDER — IPRATROPIUM-ALBUTEROL 0.5-2.5 (3) MG/3ML IN SOLN
RESPIRATORY_TRACT | 0 refills | Status: DC
  Filled 2024-05-08: qty 180, 30d supply, fill #0

## 2024-05-08 MED ORDER — GABAPENTIN 400 MG PO CAPS
400.0000 mg | ORAL_CAPSULE | Freq: Three times a day (TID) | ORAL | 3 refills | Status: DC
Start: 2024-05-08 — End: 2024-05-14
  Filled 2024-05-08: qty 90, 30d supply, fill #0

## 2024-05-08 MED ORDER — FUROSEMIDE 40 MG PO TABS
40.0000 mg | ORAL_TABLET | Freq: Every day | ORAL | 3 refills | Status: DC
  Filled 2024-05-08: qty 60, 60d supply, fill #0

## 2024-05-08 MED ORDER — GUAIFENESIN-CODEINE 100-10 MG/5ML PO SOLN
5.0000 mL | Freq: Two times a day (BID) | ORAL | 0 refills | Status: DC | PRN
  Filled 2024-05-08: qty 120, 12d supply, fill #0

## 2024-05-08 MED ORDER — GUAIFENESIN ER 1200 MG PO TB12
1200.0000 mg | ORAL_TABLET | Freq: Two times a day (BID) | ORAL | 0 refills | Status: DC
  Filled 2024-05-08: qty 14, 7d supply, fill #0

## 2024-05-08 MED ORDER — BENZONATATE 200 MG PO CAPS
200.0000 mg | ORAL_CAPSULE | Freq: Three times a day (TID) | ORAL | 1 refills | Status: DC | PRN
  Filled 2024-05-08: qty 30, 10d supply, fill #0

## 2024-05-08 MED ORDER — ACETAMINOPHEN 500 MG PO TABS
1000.0000 mg | ORAL_TABLET | Freq: Four times a day (QID) | ORAL | Status: DC | PRN
Start: 2024-05-08 — End: 2024-05-13

## 2024-05-08 MED ORDER — OXYCODONE HCL 5 MG PO TABS
2.5000 mg | ORAL_TABLET | ORAL | 0 refills | Status: DC | PRN
  Filled 2024-05-08: qty 30, 10d supply, fill #0

## 2024-05-08 MED ORDER — FUROSEMIDE 40 MG PO TABS: 40.0000 mg | ORAL_TABLET | Freq: Two times a day (BID) | ORAL | Status: DC

## 2024-05-08 MED ORDER — INSULIN GLARGINE 100 UNIT/ML ~~LOC~~ SOLN: 40.0000 [IU] | Freq: Every day | SUBCUTANEOUS | Status: DC

## 2024-05-08 NOTE — Plan of Care (Signed)
   Problem: Education: Goal: Ability to describe self-care measures that may prevent or decrease complications (Diabetes Survival Skills Education) will improve Outcome: Progressing Goal: Individualized Educational Video(s) Outcome: Progressing   Problem: Coping: Goal: Ability to adjust to condition or change in health will improve Outcome: Progressing

## 2024-05-08 NOTE — Plan of Care (Signed)
  Daily Progress Note   Patient Name: Jose Patrick       Date: 05/08/2024 DOB: [birth year]  Age: 73 y.o. MRN#: [id number] Attending Physician: Davia Nydia POUR, MD Primary Care Physician: [facility] Admit Date: 05/03/2024 Length of Stay: 4 days  Discussed care with primary hospitalist today.  Patient being discharged today.  Plan is to follow-up in outpatient setting with oncology and palliative medicine team at [facility].  Referral has already been placed for palliative medicine follow-up in outpatient setting.  Hospitalist to discharge patient with as needed oxycodone  to assist with symptom management.. Thank you for involving our team in patient's care.    Tinnie Radar, DO Palliative Care Provider PMT # [phone]  No Charge Note

## 2024-05-08 NOTE — Progress Notes (Signed)
 The proposed treatment discussed in conference is for discussion purpose only and is not a binding recommendation.  The patients have not been physically examined, or presented with their treatment options.  Therefore, final treatment plans cannot be decided.

## 2024-05-08 NOTE — Progress Notes (Signed)
   05/08/24 0153  BiPAP/CPAP/SIPAP  $ Non-Invasive Home Ventilator  Subsequent  BiPAP/CPAP/SIPAP Pt Type Adult  BiPAP/CPAP/SIPAP DREAMSTATIOND  Mask Type Full face mask  Dentures removed? Not applicable  Mask Size Large  Flow Rate 5 lpm  Patient Home Machine No  Patient Home Mask No  Patient Home Tubing No  Auto Titrate Yes  Minimum cmH2O 5 cmH2O  Maximum cmH2O 20 cmH2O

## 2024-05-08 NOTE — Discharge Summary (Signed)
 Physician Discharge Summary   Patient: Jose Patrick MRN: [id number] DOB: 19-Jun-1951  Admit date:     05/03/2024  Discharge date: 05/08/24  Discharge Physician: Nydia Distance, MD    PCP: [facility]   Recommendations at discharge:   Continue prednisone  with taper 40 mg for 4 days, 30 mg for 4 days, 20 mg for 4 days, 10 mg for 4 days then off, outpatient follow-up with his pulmonologist, Dr. Kassie Patient to follow-up with Dr. Sherrod and also plan outpatient management for stage IV and NSCLC Continue Lasix  40 mg twice daily for 3 days then 40 mg daily with extra dose of Lasix  if needed for shortness of breath, weight gain, edema  Discharge Diagnoses:    Metastatic lung cancer (metastasis from lung to other site) [facility])   COPD with acute exacerbation (HCC)   Acute on chronic respiratory failure with hypoxia (HCC)   Hypervolemia   DNR (do not resuscitate)   Acute on chronic hypoxic respiratory failure [facility])   Palliative care encounter   Medication management   Primary adenocarcinoma of upper lobe of right lung (HCC)   Type 2 diabetes mellitus with complication, with long-term current use of insulin  [facility])    Hospital Course:  Patient is a 73 y.o. 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 He returns day after discharge with sudden worsening in his breathing. Reports was only using 2L O2 at home despite being Rx 4L at discharge. Ultimately due to symptoms increased to 2L -> 4L -> 6L. While at rest had significant SOB and associated chest pressure. Feels like  his lungs are being compressed. He is having thick tenacious sputum described like white glue. And also scant hemoptysis (very small amount of blood tinged sputum at bedside reports similar to that at home).   Assessment and Plan:  Acute on chronic hypoxic respiratory failure, outpatient on 4L O2  Stage IV NSCLC -seen by Dr. Gatha last admission and recommended an outpatient PET scan which has not yet to be done Bilateral pleural effusion L > R, symptomatic  -  recent diagnosis Stage IV NSCLC with Bronchoscopy and Bx 6/5 consistent with NSCLC throughout the R lung; pathology consistent with adenocarcinoma. Additional lung nodules noted in the L lung. Bilateral effusion also suspect metastatic.  Did have recent rhinovirus pneumonia 6/2, BAL Culture 6/5 no growth.  - IR was consulted for thoracentesis, no significant fluid for tap - Received TXA nebs, IV Solu-Medrol , bronchodilators, I-S, flutter valve - Palliative medicine was consulted. - CTA chest negative for pulmonary embolism, consolidation of the entire right lower lobe with worsening insubstantial consolidation in the right middle lobe and right upper lobe, extensive small nodules diffusely throughout the both lungs, increased in size and number from 04/19/2024 (heavy burden of lung metastatic disease versus fungal or atypical infection). - Pulmonology was consulted, unfortunately not much to offer, recommended continue current management and oncology evaluation.  - Hemoptysis resolved, feeling better, wean O2 as tolerated.   - Transition to prednisone  with taper - Appreciate oncology follow-up by Dr. Sherrod, plan outpatient workup and management, patient agreement, he will also discuss with  his [place] oncologist.         Acute on chronic diastolic CHF HFpEF Mild Hypervolemia  - Last TTE 9/'24 with LVEF 55-60%, mild cLVH, grade 1 diastolic dysfunction. Despite relative low BNP has increased and may be low related to weight. Has significant  lower extremity edema. - The patient, shortness of breath and lower extremity edema is improving, still having occasional episodes of increased work of breathing -2D echo showed EF of 55 to 60%, indeterminate diastolic parameters, RV SF - Placed on IV Lasix , transition to oral Lasix  40 mg twice daily for 3 days, continue 40 mg p.o. daily   DM type II uncontrolled with with hyperglycemia - Hemoglobin A1c 7.8 on 04/03/2024 -CBGs elevated due to IV Solu-Medrol  -Increase Semglee  40 units daily, continue outpatient regimen including, semaglutide , Jardiance      OSA: Per patient, he has BiPAP at home   CAD, chronic angina / HLD:  - Aspirin  had been discontinued last admission. -  Continue home atorvastatin , Zetia , metoprolol , ranolazine .      HTN: - Continue outpatient antihypertensives   GERD:  - Continue famotidine , PPI    BPH:  - Continue finasteride       Bipolar d/o, PTSD: noted not on treatment    Obesity type II Estimated body mass index is 36.74 kg/m as calculated from the following:   Height as of this encounter: 5' 8 (1.727 m).   Weight as of this encounter: 109.6 kg.  Plan discussed with the patient's wife on the phone, discharge and management     Pain control - [place]  Controlled Substance Reporting System database was reviewed. and patient was instructed, not to drive, operate heavy machinery, perform activities at heights, swimming or participation in water activities or provide baby-sitting services while on Pain, Sleep and Anxiety Medications; until their outpatient Physician has advised to do so again. Also recommended to not to take more than prescribed Pain, Sleep and Anxiety Medications.  Consultants: Pulmonology, oncology, palliative medicine Procedures performed:   Disposition: Home Diet recommendation:  Discharge Diet Orders (From admission, onward)     Start     Ordered   05/08/24 0000  Diet Carb Modified        05/08/24 1011            DISCHARGE MEDICATION: Allergies as of 05/08/2024       Reactions   Chlorzoxazone Swelling, Other (See Comments)   Psychosis  (Parafon Forte) ABSOLUTELY CANNOT HAVE THIS!!   Tramadol Other (See Comments)   Muscles Tightness         Medication List     PAUSE taking these medications    aspirin  81 MG chewable tablet Wait to take this until your doctor or other care provider tells you to start again. Chew 1 tablet (81 mg total) by mouth daily.       TAKE these medications    acetaminophen  500 MG tablet Commonly known as: TYLENOL  Take 2 tablets (1,000 mg total) by mouth every 6 (six) hours as needed for mild pain (pain score 1-3) (Available over-the-counter).   albuterol  108 (90 Base) MCG/ACT inhaler Commonly known as: VENTOLIN  HFA Inhale 2 puffs into the lungs every 6 (six) hours as needed for wheezing or shortness of breath.   ammonium lactate 12 % lotion Commonly known as: LAC-HYDRIN Apply 1 Application topically as needed for dry skin.   atorvastatin  40 MG tablet Commonly known as: LIPITOR Take 1 tablet (40 mg total) by mouth daily. What changed: when to  take this   benzonatate  200 MG capsule Commonly known as: TESSALON  Take 1 capsule (200 mg total) by mouth 3 (three) times daily as needed for cough.   cycloSPORINE  0.05 % ophthalmic emulsion Commonly known as: RESTASIS  Place 1 drop into both eyes 2 (two) times daily.   dextromethorphan -guaiFENesin  10-100 MG/5ML liquid Commonly known as: Tussin DM Take 5 mLs by mouth every 4 (four) hours as needed for cough.   docusate sodium  100 MG capsule Commonly known as: COLACE Take 100 mg by mouth daily as needed for mild constipation.   ezetimibe  10 MG tablet Commonly known as: ZETIA  Take 10 mg by mouth daily.   famotidine  20 MG tablet Commonly known as: Pepcid  Take 1 tablet (20 mg total) by mouth at bedtime.   feeding supplement Liqd Take 237 mLs by mouth 2 (two) times daily between meals.   ferrous  sulfate 325 (65 FE) MG EC tablet Take 325 mg by mouth in the morning and at bedtime.   finasteride  5 MG tablet Commonly known as: PROSCAR  Take 1 tablet (5 mg total) by mouth daily.   fluticasone  50 MCG/ACT nasal spray Commonly known as: FLONASE  Place 2 sprays into both nostrils in the morning.   fluticasone -salmeterol 250-50 MCG/ACT Aepb Commonly known as: Wixela Inhub ONE puff in the in morning and ONE puff in the evening What changed:  how much to take how to take this when to take this additional instructions   furosemide  40 MG tablet Commonly known as: LASIX  Take 1 tablet (40 mg total) by mouth daily. Take Lasix  1 tablet (40mg ) twice a day for 3 days, then continue daily. What changed:  medication strength how much to take when to take this reasons to take this additional instructions   gabapentin  400 MG capsule Commonly known as: NEURONTIN  Take 1 capsule (400 mg total) by mouth 3 (three) times daily. What changed:  medication strength how much to take when to take this additional instructions   glucose 4 GM chewable tablet Chew 1 tablet by mouth as needed for low blood sugar.   Guaifenesin  1200 MG Tb12 Take 1 tablet (1,200 mg total) by mouth 2 (two) times daily.   guaiFENesin -codeine  100-10 MG/5ML syrup Take 5 mLs by mouth 2 (two) times daily as needed for cough.   insulin  glargine 100 UNIT/ML injection Commonly known as: LANTUS  Inject 0.4 mLs (40 Units total) into the skin daily. What changed: how much to take   ipratropium-albuterol  0.5-2.5 (3) MG/3ML Soln Commonly known as: DUONEB Take 3 mLs by nebulization 3 (three) times daily for 5 days, THEN 3 mLs every 6 (six) hours as needed. Start taking on: May 08, 2024 What changed: See the new instructions.   Jardiance  25 MG Tabs tablet Generic drug: empagliflozin  Take 25 mg by mouth at bedtime.   losartan  25 MG tablet Commonly known as: COZAAR  Take 12.5 mg by mouth daily.   Magnesium  Oxide 420 MG  Tabs Take 420 mg by mouth daily with breakfast.   Metamucil Smooth Texture 58.6 % powder Generic drug: psyllium Take 1 packet by mouth daily.   metFORMIN  1000 MG tablet Commonly known as: GLUCOPHAGE  Take 1,000 mg by mouth 2 (two) times daily.   metoprolol  succinate 100 MG 24 hr tablet Commonly known as: TOPROL -XL Take 100 mg by mouth daily. Take with or immediately following a meal.   omeprazole  40 MG capsule Commonly known as: PRILOSEC Take 1 capsule (40 mg total) by mouth daily. What changed: when to take this  oxyCODONE  5 MG immediate release tablet Commonly known as: Oxy IR/ROXICODONE  Take 0.5 tablets (2.5 mg total) by mouth every 4 (four) hours as needed for moderate pain (pain score 4-6) (shortness of breath).   OXYGEN Inhale 2 L/min into the lungs continuous.   predniSONE  10 MG tablet Commonly known as: DELTASONE  Prednisone  dosing: Take  Prednisone  40mg  (4 tabs) x 4 days, then taper to 30mg  (3 tabs) x 4 days, then 20mg  (2 tabs) x 4days, then 10mg  (1 tab) x 4days, then OFF. What changed: additional instructions   pyridoxine  100 MG tablet Commonly known as: B-6 Take 100 mg by mouth daily.   ranolazine  500 MG 12 hr tablet Commonly known as: RANEXA  Take 1 tablet (500 mg total) by mouth 2 (two) times daily.   Semaglutide  (2 MG/DOSE) 8 MG/3ML Sopn Inject 2 mg into the skin every Sunday.   sildenafil 100 MG tablet Commonly known as: VIAGRA Take 100 mg by mouth daily as needed for erectile dysfunction.   Spiriva  Respimat 2.5 MCG/ACT Aers Generic drug: Tiotropium Bromide  Monohydrate Inhale 2 puffs into the lungs daily.   Systane Balance 0.6 % Soln Generic drug: Propylene Glycol Place 1 drop into both eyes in the morning.   Testosterone 1.62 % Gel Place 2 Pump onto the skin in the morning.   triamcinolone ointment 0.1 % Commonly known as: KENALOG Apply 1 Application topically 2 (two) times daily.   Vitamin D3 1000 units Caps Take 1,000 Units by mouth  daily.        Follow-up Information     [facility] [place], [place] Follow up.   Why: You will resume your home health services with [facility]. The office will call you with resumption of care information. If you have any questions please call the number provided above. Contact information: [address] [place] [address] [phone]         [facility]. Schedule an appointment as soon as possible for a visit in 1 week(s).   Why: for hospital follow-up Contact information: [address] [facility] [place] [address] [phone]         Kassie Acquanetta Bradley, MD. Schedule an appointment as soon as possible for a visit in 2 week(s).   Specialty: Pulmonary Disease Why: for hospital follow-up Contact information: [address] [place] [address] [phone]         Sherrod Sherrod, MD Follow up on 05/11/2024.   Specialty: Oncology Why: at 9:45AM, for hospital follow-up Contact information: [address] [place] [address] [phone]                Discharge Exam: Fredricka Weights   05/06/24 0512 05/07/24 0536 05/08/24 0506  Weight: 109.6 kg 109.6 kg 109.5 kg   S: No acute complaints, feeling a lot better from admission.  Looking forward to discharge home today.  Hemoptysis resolved.  BP 131/67 (BP Location: Right Arm)   Pulse 64   Temp (!) 97.5 F (36.4 C) (Oral)   Resp 14   Ht 5' 8 (1.727 m)   Wt 109.5 kg   SpO2 95%   BMI 36.71 kg/m   Physical Exam General: Alert and oriented x 3, NAD Cardiovascular: S1 S2 clear, RRR.  Respiratory: CTAB, no wheezing Gastrointestinal: Soft, nontender, nondistended, NBS Ext: trace pedal edema bilaterally Neuro: no new deficits Psych: Normal affect    Condition at discharge: fair  The results of significant diagnostics from this hospitalization (  including imaging, microbiology, ancillary and laboratory) are listed  below for reference.   Imaging Studies: CT Angio Chest Pulmonary Embolism (PE) W or WO Contrast Result Date: 05/04/2024 CLINICAL DATA:  Stage IV non-small cell lung cancer, COPD, shortness of breath, chest pressure. * Tracking Code: BO * EXAM: CT ANGIOGRAPHY CHEST WITH CONTRAST TECHNIQUE: Multidetector CT imaging of the chest was performed using the standard protocol during bolus administration of intravenous contrast. Multiplanar CT image reconstructions and MIPs were obtained to evaluate the vascular anatomy. RADIATION DOSE REDUCTION: This exam was performed according to the departmental dose-optimization program which includes automated exposure control, adjustment of the mA and/or kV according to patient size and/or use of iterative reconstruction technique. CONTRAST:  75mL OMNIPAQUE  IOHEXOL  350 MG/ML SOLN COMPARISON:  04/01/2024 FINDINGS: Despite efforts by the technologist and patient, motion artifact is present on today's exam and could not be eliminated. This reduces exam sensitivity and specificity. Cardiovascular: No filling defect is identified in the pulmonary arterial tree to suggest pulmonary embolus. Coronary, aortic arch, and branch vessel atherosclerotic vascular disease. Borderline cardiomegaly. Mediastinum/Nodes: AP window lymph node 1.0 cm in short axis on image 32 series 4, stable. Stable mild right paratracheal and stable right hilar adenopathy. Mildly dilated distal esophagus with air-fluid level. Lungs/Pleura: Emphysema. Continued consolidation of the entire right lower lobe with worsening consolidation in the right middle lobe and right upper lobe. Extensive small nodules are present diffusely throughout both lungs, substantially increased in size and number from 04/19/2024, possibilities include heavy burden of lung metastatic disease versus fungal or atypical infection. No cavitation identified. Upper Abdomen: Contracted gallbladder. Atheromatous plaque at the origin of the celiac  trunk with some narrowing of the celiac trunk which may be due to median arcuate ligament. Mild right retrocrural adenopathy. Musculoskeletal: Thoracic spondylosis. Lower cervical plate and screw fixator. Review of the MIP images confirms the above findings. IMPRESSION: 1. No filling defect is identified in the pulmonary arterial tree to suggest pulmonary embolus. Adversely affected negative predictive value due to motion artifact. 2. Continued consolidation of the entire right lower lobe with worsening and substantial consolidation in the right middle lobe and right upper lobe. 3. Extensive small nodules are present diffusely throughout both lungs, substantially increased in size and number from 04/19/2024, possibilities include heavy burden of lung metastatic disease versus fungal or atypical infection. 4. Stable mild mediastinal and right hilar adenopathy. 5. Mildly dilated distal esophagus with air-fluid level, suggesting reflux. 6. Aortic Atherosclerosis (ICD10-I70.0) and Emphysema (ICD10-J43.9). Electronically Signed   By: Ryan Salvage M.D.   On: 05/04/2024 16:45   US  CHEST (PLEURAL EFFUSION) Result Date: 05/04/2024 CLINICAL DATA:  Right-sided pleural effusion EXAM: CHEST ULTRASOUND COMPARISON:  None Available. FINDINGS: Minimal fluid in present upon examination with ultrasound. No procedure performed. IMPRESSION: Minimal pleural fluid. Electronically Signed   By: Cordella Banner   On: 05/04/2024 14:12   ECHOCARDIOGRAM COMPLETE Result Date: 05/04/2024    ECHOCARDIOGRAM REPORT   Patient Name:   VINAYAK BOBIER Date of Exam: 05/04/2024 Medical Rec #:  [id number]          Height:       68.0 in Accession #:    [id number]         Weight:       197.3 lb Date of Birth:  December 10, 1950          BSA:          2.032 m Patient Age:    72 years  BP:           134/85 mmHg Patient Gender: M                  HR:           81 bpm. Exam Location:  Inpatient Procedure: 2D Echo, Cardiac Doppler and Color  Doppler (Both Spectral and Color            Flow Doppler were utilized during procedure). Indications:    Congestive Heart Failure I50.9  History:        Patient has prior history of Echocardiogram examinations, most                 recent 08/15/2023. Risk Factors:Hypertension and Diabetes.  Sonographer:    Jayson Gaskins Referring Phys: [id number] JONATHAN SEGARS IMPRESSIONS  1. Technically difficult study with poor visualization of endocardium.  2. Left ventricular ejection fraction, by estimation, is 55 to 60%. The left ventricle has normal function. The left ventricle has no regional wall motion abnormalities. Left ventricular diastolic parameters are indeterminate.  3. Right ventricular systolic function is normal. The right ventricular size is normal.  4. The mitral valve is normal in structure. No evidence of mitral valve regurgitation. No evidence of mitral stenosis.  5. The aortic valve has an indeterminant number of cusps. Aortic valve regurgitation is not visualized. No aortic stenosis is present.  6. The inferior vena cava is normal in size with greater than 50% respiratory variability, suggesting right atrial pressure of 3 mmHg. FINDINGS  Left Ventricle: Left ventricular ejection fraction, by estimation, is 55 to 60%. The left ventricle has normal function. The left ventricle has no regional wall motion abnormalities. The left ventricular internal cavity size was normal in size. There is  no left ventricular hypertrophy. Left ventricular diastolic parameters are indeterminate. Right Ventricle: The right ventricular size is normal. No increase in right ventricular wall thickness. Right ventricular systolic function is normal. Left Atrium: Left atrial size was normal in size. Right Atrium: Right atrial size was normal in size. Pericardium: There is no evidence of pericardial effusion. Mitral Valve: The mitral valve is normal in structure. No evidence of mitral valve regurgitation. No evidence of mitral valve  stenosis. Tricuspid Valve: The tricuspid valve is normal in structure. Tricuspid valve regurgitation is not demonstrated. No evidence of tricuspid stenosis. Aortic Valve: The aortic valve has an indeterminant number of cusps. Aortic valve regurgitation is not visualized. No aortic stenosis is present. Aortic valve mean gradient measures 3.0 mmHg. Aortic valve peak gradient measures 5.4 mmHg. Aortic valve area, by VTI measures 2.55 cm. Pulmonic Valve: The pulmonic valve was normal in structure. Pulmonic valve regurgitation is not visualized. No evidence of pulmonic stenosis. Aorta: The aortic root is normal in size and structure. Venous: The inferior vena cava is normal in size with greater than 50% respiratory variability, suggesting right atrial pressure of 3 mmHg. IAS/Shunts: No atrial level shunt detected by color flow Doppler.  LEFT VENTRICLE PLAX 2D LVIDd:         3.50 cm   Diastology LVIDs:         1.80 cm   LV e' medial:    6.85 cm/s LV PW:         0.90 cm   LV E/e' medial:  10.6 LV IVS:        0.60 cm   LV e' lateral:   9.46 cm/s LVOT diam:     1.90 cm   LV E/e'  lateral: 7.7 LV SV:         57 LV SV Index:   28 LVOT Area:     2.84 cm  RIGHT VENTRICLE RV S prime:     12.80 cm/s TAPSE (M-mode): 2.0 cm LEFT ATRIUM             Index        RIGHT ATRIUM           Index LA Vol (A2C):   32.2 ml 15.85 ml/m  RA Area:     10.60 cm LA Vol (A4C):   47.3 ml 23.28 ml/m  RA Volume:   20.10 ml  9.89 ml/m LA Biplane Vol: 40.6 ml 19.98 ml/m  AORTIC VALVE AV Area (Vmax):    2.49 cm AV Area (Vmean):   2.22 cm AV Area (VTI):     2.55 cm AV Vmax:           116.00 cm/s AV Vmean:          85.700 cm/s AV VTI:            0.222 m AV Peak Grad:      5.4 mmHg AV Mean Grad:      3.0 mmHg LVOT Vmax:         102.00 cm/s LVOT Vmean:        67.000 cm/s LVOT VTI:          0.200 m LVOT/AV VTI ratio: 0.90  AORTA Ao Root diam: 3.10 cm MITRAL VALVE MV Area (PHT): 2.08 cm    SHUNTS MV E velocity: 72.80 cm/s  Systemic VTI:  0.20 m MV A  velocity: 94.30 cm/s  Systemic Diam: 1.90 cm MV E/A ratio:  0.77 Aditya Sabharwal Electronically signed by Ria Commander Signature Date/Time: 05/04/2024/2:02:29 PM    Final    DG Chest Portable 1 View Result Date: 05/03/2024 CLINICAL DATA:  Stage IV lung cancer, diabetes, productive cough after bronchoscopy yesterday EXAM: PORTABLE CHEST 1 VIEW COMPARISON:  04/25/2024 FINDINGS: Single frontal view of the chest demonstrates a stable cardiac silhouette. There is widespread bilateral airspace disease, right greater than left, increased since prior study. Underlying effusions are suspected, right greater than left. No pneumothorax. No acute bony abnormalities. IMPRESSION: 1. Progressive widespread bilateral airspace disease, right greater than left, which could reflect worsening infection, edema, or hemorrhage. 2. Bilateral pleural effusions, right greater than left. Electronically Signed   By: Ozell Daring M.D.   On: 05/03/2024 17:33   MR BRAIN WO CONTRAST Result Date: 04/28/2024 CLINICAL DATA:  Non-small cell lung carcinoma staging EXAM: MRI HEAD WITHOUT CONTRAST TECHNIQUE: Multiplanar, multiecho pulse sequences of the brain and surrounding structures were obtained without intravenous contrast. COMPARISON:  06/30/2023 FINDINGS: Brain: No acute infarct, mass effect or extra-axial collection. No acute or chronic hemorrhage. There is multifocal hyperintense T2-weighted signal within the white matter. Generalized volume loss. The midline structures are normal. Lack of intravenous contrast agent administration limits sensitivity for the detection of metastases. Vascular: Normal flow voids. Skull and upper cervical spine: Normal calvarium and skull base. Visualized upper cervical spine and soft tissues are normal. Sinuses/Orbits:No paranasal sinus fluid levels or advanced mucosal thickening. No mastoid or middle ear effusion. Normal orbits. IMPRESSION: 1. Lack of intravenous contrast agent administration limits  sensitivity for the detection of metastases. 2. No acute intracranial abnormality. 3. Findings of chronic small vessel ischemia and volume loss. Electronically Signed   By: Franky Stanford M.D.   On: 04/28/2024 18:19   DG CHEST  PORT 1 VIEW Result Date: 04/25/2024 CLINICAL DATA:  Increased dyspnea EXAM: PORTABLE CHEST 1 VIEW COMPARISON:  04/22/2024 FINDINGS: Stable cardiomediastinal silhouette. Diffuse bilateral reticulonodular opacities and interstitial coarsening prior. There is similar to increased airspace opacities in the right mid and lower lung. Layering right greater than left pleural effusions. No pneumothorax. No displaced rib fracture. IMPRESSION: 1. Similar to increased airspace and interstitial opacities greatest in the right lower lobe. 2. Layering right greater than left pleural effusions. Electronically Signed   By: Norman Gatlin M.D.   On: 04/25/2024 19:37   DG Chest Port 1 View Result Date: 04/22/2024 CLINICAL DATA:  Status post bronchoscopy with biopsy. EXAM: PORTABLE CHEST 1 VIEW COMPARISON:  April 20, 2024. FINDINGS: Stable cardiomediastinal silhouette. Stable bilateral reticulonodular opacities are noted throughout both lungs concerning for pneumonia or edema with associated pleural effusions, right greater than left. No pneumothorax. Bony thorax is unremarkable. IMPRESSION: Bilateral lung opacities and pleural effusions as noted above. No pneumothorax is noted. Electronically Signed   By: Lynwood Landy Raddle M.D.   On: 04/22/2024 13:47   DG C-ARM BRONCHOSCOPY Result Date: 04/22/2024 C-ARM BRONCHOSCOPY: Fluoroscopy was utilized by the requesting physician.  No radiographic interpretation.   DG CHEST PORT 1 VIEW Result Date: 04/20/2024 CLINICAL DATA:  Status post thoracentesis. EXAM: PORTABLE CHEST 1 VIEW COMPARISON:  Radiograph in CT yesterday FINDINGS: No pneumothorax post thoracentesis. Slight decreased right basilar opacity, persistent airspace disease and likely pleural fluid persists.  Underlying emphysema and bronchial thickening. Pulmonary nodules on CT are faintly visualized. Stable heart size and mediastinal contours. IMPRESSION: 1. No pneumothorax post thoracentesis. 2. Slight decreased right basilar opacity. Persistent right sided airspace disease and likely pleural fluid. Electronically Signed   By: Andrea Gasman M.D.   On: 04/20/2024 15:10   CT CHEST WO CONTRAST Result Date: 04/19/2024 CLINICAL DATA:  Multifocal pneumonia. Worsening cough and shortness of breath over the past few days. Decreased oxygen saturation. EXAM: CT CHEST WITHOUT CONTRAST TECHNIQUE: Multidetector CT imaging of the chest was performed following the standard protocol without IV contrast. RADIATION DOSE REDUCTION: This exam was performed according to the departmental dose-optimization program which includes automated exposure control, adjustment of the mA and/or kV according to patient size and/or use of iterative reconstruction technique. COMPARISON:  Chest radiograph 04/19/2024.  CT chest 04/01/2024 FINDINGS: Cardiovascular: Normal heart size. Minimal pericardial effusion. Normal caliber thoracic aorta. Calcification of the aorta and coronary arteries. Mediastinum/Nodes: Esophagus is decompressed. Thyroid gland is unremarkable. Mediastinal lymphadenopathy with largest pretracheal nodes measuring up to about 1.4 cm diameter. No change since prior study. This is nonspecific but likely to be reactive. Lungs/Pleura: Bilateral pleural effusions, greater on the right. Dense consolidation in the right lower lung with patchy airspace disease throughout the remainder the right lung. Numerous small nodules demonstrated throughout the left lung in the area of portion of the right lung which are new since prior study. Short interval time frame suggest that these are likely infectious or inflammatory. Changes most likely represent progressing pneumonia. This could also represent atypical pneumonia such as TB or fungal  infection. Septic emboli would also be a possibility. Follow-up to resolution is recommended. Underlying emphysematous changes in the lungs. Upper Abdomen: No acute abnormalities. Musculoskeletal: Degenerative changes in the spine. Postoperative change in the cervical spine. No acute bony abnormalities. IMPRESSION: 1. Bilateral pleural effusions, greater on the right. 2. Dense consolidation in the right lower lung with airspace disease throughout the remaining right lung. This is progressing since the prior study. 3.  Developing diffuse fine nodular infiltration throughout the left lung in the aerated portion of the right lung. This is likely infectious or inflammatory. 4. Underlying emphysematous changes in the lungs. Electronically Signed   By: Elsie Gravely M.D.   On: 04/19/2024 18:25   DG Chest 2 View Result Date: 04/19/2024 CLINICAL DATA:  Cough, shortness of breath EXAM: CHEST - 2 VIEW COMPARISON:  04/01/2024 FINDINGS: Diffuse bilateral airspace disease again noted, right greater than left, similar to prior study. Possible small bilateral effusions. Heart and mediastinal contours within normal limits. Aortic atherosclerosis. IMPRESSION: Continued diffuse bilateral airspace disease, right greater than left concerning for pneumonia. Findings similar to prior study. Electronically Signed   By: Franky Crease M.D.   On: 04/19/2024 12:48    Microbiology: Results for orders placed or performed during the hospital encounter of 05/03/24  Resp panel by RT-PCR (RSV, Flu A&B, Covid) Anterior Nasal Swab     Status: None   Collection Time: 05/03/24  4:41 PM   Specimen: Anterior Nasal Swab  Result Value Ref Range Status   SARS Coronavirus 2 by RT PCR NEGATIVE NEGATIVE Final    Comment: (NOTE) SARS-CoV-2 target nucleic acids are NOT DETECTED.  The SARS-CoV-2 RNA is generally detectable in upper respiratory specimens during the acute phase of infection. The lowest concentration of SARS-CoV-2 viral copies this  assay can detect is 138 copies/mL. A negative result does not preclude SARS-Cov-2 infection and should not be used as the sole basis for treatment or other patient management decisions. A negative result may occur with  improper specimen collection/handling, submission of specimen other than nasopharyngeal swab, presence of viral mutation(s) within the areas targeted by this assay, and inadequate number of viral copies(<138 copies/mL). A negative result must be combined with clinical observations, patient history, and epidemiological information. The expected result is Negative.  Fact Sheet for Patients:  BloggerCourse.com  Fact Sheet for Healthcare Providers:  SeriousBroker.it  This test is no t yet approved or cleared by the [place]  FDA and  has been authorized for detection and/or diagnosis of SARS-CoV-2 by FDA under an Emergency Use Authorization (EUA). This EUA will remain  in effect (meaning this test can be used) for the duration of the COVID-19 declaration under Section 564(b)(1) of the Act, 21 U.S.C.section 360bbb-3(b)(1), unless the authorization is terminated  or revoked sooner.       Influenza A by PCR NEGATIVE NEGATIVE Final   Influenza B by PCR NEGATIVE NEGATIVE Final    Comment: (NOTE) The Xpert Xpress SARS-CoV-2/FLU/RSV plus assay is intended as an aid in the diagnosis of influenza from Nasopharyngeal swab specimens and should not be used as a sole basis for treatment. Nasal washings and aspirates are unacceptable for Xpert Xpress SARS-CoV-2/FLU/RSV testing.  Fact Sheet for Patients: BloggerCourse.com  Fact Sheet for Healthcare Providers: SeriousBroker.it  This test is not yet approved or cleared by the [place]  FDA and has been authorized for detection and/or diagnosis of SARS-CoV-2 by FDA under an Emergency Use Authorization (EUA). This EUA will  remain in effect (meaning this test can be used) for the duration of the COVID-19 declaration under Section 564(b)(1) of the Act, 21 U.S.C. section 360bbb-3(b)(1), unless the authorization is terminated or revoked.     Resp Syncytial Virus by PCR NEGATIVE NEGATIVE Final    Comment: (NOTE) Fact Sheet for Patients: BloggerCourse.com  Fact Sheet for Healthcare Providers: SeriousBroker.it  This test is not yet approved or cleared by the [place]  FDA and has been authorized  for detection and/or diagnosis of SARS-CoV-2 by FDA under an Emergency Use Authorization (EUA). This EUA will remain in effect (meaning this test can be used) for the duration of the COVID-19 declaration under Section 564(b)(1) of the Act, 21 U.S.C. section 360bbb-3(b)(1), unless the authorization is terminated or revoked.  Performed at [facility], [address] [place], [place] [address]     Labs: CBC: Recent Labs  Lab 05/03/24 1641 05/03/24 1648 05/04/24 0621 05/05/24 0537  WBC 14.3*  --  14.3* 18.5*  NEUTROABS 12.7*  --   --   --   HGB 14.7 16.7 15.0 14.4  HCT 46.9 49.0 48.2 45.8  MCV 83.6  --  83.2 83.3  PLT 274  --  290 265   Basic Metabolic Panel: Recent Labs  Lab 05/03/24 1648 05/03/24 1725 05/04/24 0621 05/04/24 1647 05/05/24 0537  NA 134* 134* 140  --  136  K 4.7 4.6 4.7  --  4.9  CL 95* 95* 95*  --  95*  CO2  --  27 30  --  28  GLUCOSE 404* 383* 294* 465* 273*  BUN 26* 22 27*  --  38*  CREATININE 1.00 1.12 1.17  --  1.14  CALCIUM   --  9.2 10.0  --  9.4  MG  --   --  2.4  --   --   PHOS  --   --  5.0*  --   --    Liver Function Tests: Recent Labs  Lab 05/03/24 1725  AST 25  ALT 58*  ALKPHOS 58  BILITOT 0.5  PROT 5.7*  ALBUMIN 2.5*   CBG: Recent Labs  Lab 05/07/24 1205 05/07/24 1642 05/07/24 2154 05/08/24 0716 05/08/24 1140  GLUCAP 302* 200* 262* 188* 279*    Discharge time spent:  greater than 30 minutes.  Signed: Nydia Distance, MD [facility] 05/08/2024

## 2024-05-08 NOTE — TOC Transition Note (Signed)
 Transition of Care [facility]) - Discharge Note  Patient Details  Name: Jose Patrick MRN: [id number] Date of Birth: Dec 18, 1950  [facility] CM/SW Contact:  Duwaine GORMAN Aran, LCSW Phone Number: 05/08/2024, 11:15 AM  Clinical Narrative: Patient is discharging home today. HHPT/OT orders placed by hospitalist. CSW updated Angela with [facility]. TOC signing off.  Final next level of care: Home w Home Health Services Barriers to Discharge: Barriers Resolved  Patient Goals and CMS Choice Patient states their goals for this hospitalization and ongoing recovery are:: To return home with hospice to follow Choice offered to / list presented to : NA [place] ownership interest in [facility].provided to::  (n/a)   Discharge Plan and Services Additional resources added to the After Visit Summary for   In-house Referral: NA Discharge Planning Services: CM Consult Post Acute Care Choice: Hospice          DME Arranged: N/A (already has home o2) DME Agency: [facility]) HH Arranged: PT, OT (Active with [facility]) HH Agency: Other - See comment Damita) Date HH Agency Contacted: 05/06/24 Time HH Agency Contacted: [phone] Representative spoke with at [facility] Agency: Angie  Social Drivers of Health (SDOH) Interventions SDOH Screenings   Food Insecurity: No Food Insecurity (05/04/2024)  Housing: Low Risk  (05/04/2024)  Transportation Needs: No Transportation Needs (05/04/2024)  Utilities: Not At Risk (05/04/2024)  Depression (PHQ2-9): Low Risk  (11/20/2021)  Financial Resource Strain: Low Risk  (12/02/2023)   Received from [facility]  Physical Activity: Unknown (03/17/2024)   Received from [facility]  Social Connections: Moderately Integrated (05/04/2024)  Stress: Stress Concern Present (03/08/2024)   Received from [facility]  Tobacco Use: Medium Risk (05/03/2024)   Readmission Risk Interventions    05/06/2024    1:46 PM 04/20/2024    11:08 AM 04/02/2024    1:08 PM  Readmission Risk Prevention Plan  Transportation Screening Complete Complete Complete  PCP or Specialist Appt within 5-7 Days   Complete  PCP or Specialist Appt within 3-5 Days  Complete   Home Care Screening   Complete  Medication Review (RN CM)   Complete  HRI or Home Care Consult  Complete   Social Work Consult for Recovery Care Planning/Counseling  Complete   Palliative Care Screening  Not Applicable   Medication Review Ocean[facility]) Complete Complete   PCP or Specialist appointment within 3-5 days of discharge Complete    HRI or Home Care Consult Complete    SW Recovery Care/Counseling Consult Complete    Palliative Care Screening Complete    Skilled Nursing Facility Not Applicable

## 2024-05-10 NOTE — Progress Notes (Signed)
 At 9:17 this morning, I called the Jose Patrick to clarify at which location the Jose Patrick would like to have his oncology care as he is already established at the [facility]. The Jose Patrick can use his private insurance to be seen here, however that would incur the costs of copays and out of pocket for whatever his insurance doesn't cover. Jose Patrick states he would like to get his care at the [facility]. Jose Patrick seems to be struggling to catch his breath and have trouble speaking. Jose Patrick is on 5L [place] at baseline. I suggested that he go to the ED for evaluation. Jose Patrick states that he has called [facility], his home healthcare provider, for assistance with his oxygen tubing. Jose Patrick states it is too long which is why he is having such a hard time getting to the restroom. Jose Patrick thinks he's not getting the oxygen he needs b/c of the length of the tubing. Jose Patrick states someone should be out tomorrow to see about his oxygen. Jose Patrick also states that a home nurse should be coming to see him today. I still recommended the Jose Patrick go to the ED for assessment.   At 11:32, I called the Jose Patrick back to let him know the [facility] nav[facility] would be calling him sometime after 12 when the schedulers are available to see if she can get him in ASAP to see Dr. Anselm. Jose Patrick still sounded short of breath, but he only gets that way when he walks to the bathroom. I asked the Jose Patrick to check his O2 when he walks to the bathroom. Jose Patrick checked his O2 and it was at 79% while he had his O2 on. I immediately asked him to go to the ER. Jose Patrick's son and wife were there with him. It was difficult to hear the son talking while the Jose Patrick was, as the pts voice is very high pitched and he is somewhat difficult to understand. It sounded like there was a chance the O2 tank he was using was empty, but again it was difficult to understand what was being said. Once the Jose Patrick was sitting down and a new O2 tank was in place, the Jose Patrick's O2 level moved to 89-91%. Pts wife and son think there was a connecting piece that was kinked. Jose Patrick sounded less SOB once the new tank  was attached. I still strongly suggested that the Jose Patrick be evaluated in the ER. If he doesn't want to currently, I asked that if his symptoms worsen that he please report to the ER. Jose Patrick verbalized understanding.

## 2024-05-11 NOTE — Progress Notes (Signed)
 10:34: Pts last discharge note, pathology reports, and imaging report from [facility] on 6/17 faxed to Alfonso Pap, [facility] at [phone]. Transfer success confirmed.

## 2024-05-12 ENCOUNTER — Inpatient Hospital Stay (HOSPITAL_COMMUNITY)

## 2024-05-12 ENCOUNTER — Other Ambulatory Visit: Payer: Self-pay

## 2024-05-12 ENCOUNTER — Inpatient Hospital Stay (HOSPITAL_COMMUNITY)
Admission: EM | Admit: 2024-05-12 | Discharge: 2024-05-18 | DRG: 208 | Disposition: E | Attending: Internal Medicine | Admitting: Internal Medicine

## 2024-05-12 ENCOUNTER — Emergency Department (HOSPITAL_COMMUNITY)

## 2024-05-12 ENCOUNTER — Encounter (HOSPITAL_COMMUNITY): Payer: Self-pay | Admitting: Internal Medicine

## 2024-05-12 DIAGNOSIS — C3491 Malignant neoplasm of unspecified part of right bronchus or lung: Secondary | ICD-10-CM | POA: Diagnosis present

## 2024-05-12 DIAGNOSIS — J9622 Acute and chronic respiratory failure with hypercapnia: Secondary | ICD-10-CM

## 2024-05-12 DIAGNOSIS — E785 Hyperlipidemia, unspecified: Secondary | ICD-10-CM | POA: Diagnosis present

## 2024-05-12 DIAGNOSIS — Z860100 Personal history of colon polyps, unspecified: Secondary | ICD-10-CM | POA: Diagnosis not present

## 2024-05-12 DIAGNOSIS — I468 Cardiac arrest due to other underlying condition: Secondary | ICD-10-CM | POA: Diagnosis present

## 2024-05-12 DIAGNOSIS — Z7982 Long term (current) use of aspirin: Secondary | ICD-10-CM

## 2024-05-12 DIAGNOSIS — J9621 Acute and chronic respiratory failure with hypoxia: Principal | ICD-10-CM

## 2024-05-12 DIAGNOSIS — N400 Enlarged prostate without lower urinary tract symptoms: Secondary | ICD-10-CM | POA: Diagnosis present

## 2024-05-12 DIAGNOSIS — Z7984 Long term (current) use of oral hypoglycemic drugs: Secondary | ICD-10-CM | POA: Diagnosis not present

## 2024-05-12 DIAGNOSIS — J9690 Respiratory failure, unspecified, unspecified whether with hypoxia or hypercapnia: Secondary | ICD-10-CM | POA: Diagnosis present

## 2024-05-12 DIAGNOSIS — Z818 Family history of other mental and behavioral disorders: Secondary | ICD-10-CM | POA: Diagnosis not present

## 2024-05-12 DIAGNOSIS — Z794 Long term (current) use of insulin: Secondary | ICD-10-CM

## 2024-05-12 DIAGNOSIS — I10 Essential (primary) hypertension: Secondary | ICD-10-CM | POA: Diagnosis present

## 2024-05-12 DIAGNOSIS — Z87891 Personal history of nicotine dependence: Secondary | ICD-10-CM | POA: Diagnosis not present

## 2024-05-12 DIAGNOSIS — I469 Cardiac arrest, cause unspecified: Secondary | ICD-10-CM | POA: Diagnosis not present

## 2024-05-12 DIAGNOSIS — K5909 Other constipation: Secondary | ICD-10-CM | POA: Diagnosis present

## 2024-05-12 DIAGNOSIS — Z79890 Hormone replacement therapy (postmenopausal): Secondary | ICD-10-CM

## 2024-05-12 DIAGNOSIS — N529 Male erectile dysfunction, unspecified: Secondary | ICD-10-CM | POA: Diagnosis present

## 2024-05-12 DIAGNOSIS — C3490 Malignant neoplasm of unspecified part of unspecified bronchus or lung: Secondary | ICD-10-CM | POA: Diagnosis not present

## 2024-05-12 DIAGNOSIS — E8729 Other acidosis: Secondary | ICD-10-CM | POA: Diagnosis present

## 2024-05-12 DIAGNOSIS — Z888 Allergy status to other drugs, medicaments and biological substances status: Secondary | ICD-10-CM

## 2024-05-12 DIAGNOSIS — Z923 Personal history of irradiation: Secondary | ICD-10-CM | POA: Diagnosis not present

## 2024-05-12 DIAGNOSIS — Z885 Allergy status to narcotic agent status: Secondary | ICD-10-CM | POA: Diagnosis not present

## 2024-05-12 DIAGNOSIS — R579 Shock, unspecified: Secondary | ICD-10-CM | POA: Diagnosis present

## 2024-05-12 DIAGNOSIS — E1165 Type 2 diabetes mellitus with hyperglycemia: Secondary | ICD-10-CM | POA: Diagnosis present

## 2024-05-12 DIAGNOSIS — E119 Type 2 diabetes mellitus without complications: Secondary | ICD-10-CM

## 2024-05-12 DIAGNOSIS — R0602 Shortness of breath: Secondary | ICD-10-CM | POA: Diagnosis present

## 2024-05-12 DIAGNOSIS — J449 Chronic obstructive pulmonary disease, unspecified: Secondary | ICD-10-CM

## 2024-05-12 DIAGNOSIS — Z66 Do not resuscitate: Secondary | ICD-10-CM | POA: Diagnosis present

## 2024-05-12 DIAGNOSIS — J439 Emphysema, unspecified: Secondary | ICD-10-CM | POA: Diagnosis present

## 2024-05-12 DIAGNOSIS — R7989 Other specified abnormal findings of blood chemistry: Secondary | ICD-10-CM | POA: Diagnosis present

## 2024-05-12 DIAGNOSIS — Z515 Encounter for palliative care: Secondary | ICD-10-CM | POA: Diagnosis not present

## 2024-05-12 DIAGNOSIS — Z9981 Dependence on supplemental oxygen: Secondary | ICD-10-CM

## 2024-05-12 DIAGNOSIS — F4310 Post-traumatic stress disorder, unspecified: Secondary | ICD-10-CM | POA: Diagnosis present

## 2024-05-12 DIAGNOSIS — Z7951 Long term (current) use of inhaled steroids: Secondary | ICD-10-CM

## 2024-05-12 DIAGNOSIS — Z1152 Encounter for screening for COVID-19: Secondary | ICD-10-CM | POA: Diagnosis not present

## 2024-05-12 DIAGNOSIS — Z79899 Other long term (current) drug therapy: Secondary | ICD-10-CM

## 2024-05-12 LAB — CBC WITH DIFFERENTIAL/PLATELET
Abs Immature Granulocytes: 0.33 10*3/uL — ABNORMAL HIGH (ref 0.00–0.07)
Basophils Absolute: 0 10*3/uL (ref 0.0–0.1)
Basophils Relative: 0 %
Eosinophils Absolute: 0.3 10*3/uL (ref 0.0–0.5)
Eosinophils Relative: 2 %
HCT: 52.1 % — ABNORMAL HIGH (ref 39.0–52.0)
Hemoglobin: 15.7 g/dL (ref 13.0–17.0)
Immature Granulocytes: 2 %
Lymphocytes Relative: 14 %
Lymphs Abs: 2.5 10*3/uL (ref 0.7–4.0)
MCH: 26.1 pg (ref 26.0–34.0)
MCHC: 30.1 g/dL (ref 30.0–36.0)
MCV: 86.7 fL (ref 80.0–100.0)
Monocytes Absolute: 1.3 10*3/uL — ABNORMAL HIGH (ref 0.1–1.0)
Monocytes Relative: 7 %
Neutro Abs: 13.9 10*3/uL — ABNORMAL HIGH (ref 1.7–7.7)
Neutrophils Relative %: 75 %
Platelets: 241 10*3/uL (ref 150–400)
RBC: 6.01 MIL/uL — ABNORMAL HIGH (ref 4.22–5.81)
RDW: 15.7 % — ABNORMAL HIGH (ref 11.5–15.5)
WBC: 18.3 10*3/uL — ABNORMAL HIGH (ref 4.0–10.5)
nRBC: 0 % (ref 0.0–0.2)

## 2024-05-12 LAB — URINALYSIS, ROUTINE W REFLEX MICROSCOPIC
Bacteria, UA: NONE SEEN
Bilirubin Urine: NEGATIVE
Glucose, UA: >=500 mg/dL — AB
Ketones, ur: NEGATIVE mg/dL
Leukocytes,Ua: NEGATIVE
Nitrite: NEGATIVE
Protein, ur: NEGATIVE mg/dL
Specific Gravity, Urine: 1.026 (ref 1.005–1.030)
pH: 5 (ref 5.0–8.0)

## 2024-05-12 LAB — BLOOD GAS, ARTERIAL
Acid-base deficit: 1 mmol/L (ref 0.0–2.0)
Bicarbonate: 30.3 mmol/L — ABNORMAL HIGH (ref 20.0–28.0)
Drawn by: 51133
FIO2: 100 %
MECHVT: 540 mL
O2 Saturation: 96.2 %
PEEP: 5 cmH2O
Patient temperature: 36.7
RATE: 20 {breaths}/min
pCO2 arterial: 86 mmHg (ref 32–48)
pH, Arterial: 7.15 — CL (ref 7.35–7.45)
pO2, Arterial: 88 mmHg (ref 83–108)

## 2024-05-12 LAB — BODY FLUID CELL COUNT WITH DIFFERENTIAL
Eos, Fluid: 0 %
Lymphs, Fluid: 7 %
Monocyte-Macrophage-Serous Fluid: 10 % — ABNORMAL LOW (ref 50–90)
Neutrophil Count, Fluid: 83 % — ABNORMAL HIGH (ref 0–25)
Total Nucleated Cell Count, Fluid: 3800 uL — ABNORMAL HIGH (ref 0–1000)

## 2024-05-12 LAB — COMPREHENSIVE METABOLIC PANEL WITH GFR
ALT: 55 U/L — ABNORMAL HIGH (ref 0–44)
AST: 50 U/L — ABNORMAL HIGH (ref 15–41)
Albumin: 2.4 g/dL — ABNORMAL LOW (ref 3.5–5.0)
Alkaline Phosphatase: 67 U/L (ref 38–126)
Anion gap: 14 (ref 5–15)
BUN: 35 mg/dL — ABNORMAL HIGH (ref 8–23)
CO2: 25 mmol/L (ref 22–32)
Calcium: 8.3 mg/dL — ABNORMAL LOW (ref 8.9–10.3)
Chloride: 92 mmol/L — ABNORMAL LOW (ref 98–111)
Creatinine, Ser: 1.68 mg/dL — ABNORMAL HIGH (ref 0.61–1.24)
GFR, Estimated: 43 mL/min — ABNORMAL LOW (ref >60)
Glucose, Bld: 422 mg/dL — ABNORMAL HIGH (ref 70–99)
Potassium: 4.8 mmol/L (ref 3.5–5.1)
Sodium: 131 mmol/L — ABNORMAL LOW (ref 135–145)
Total Bilirubin: 0.8 mg/dL (ref 0.0–1.2)
Total Protein: 5.6 g/dL — ABNORMAL LOW (ref 6.5–8.1)

## 2024-05-12 LAB — GLUCOSE, CAPILLARY
Glucose-Capillary: 194 mg/dL — ABNORMAL HIGH (ref 70–99)
Glucose-Capillary: 215 mg/dL — ABNORMAL HIGH (ref 70–99)
Glucose-Capillary: 220 mg/dL — ABNORMAL HIGH (ref 70–99)
Glucose-Capillary: 279 mg/dL — ABNORMAL HIGH (ref 70–99)
Glucose-Capillary: 361 mg/dL — ABNORMAL HIGH (ref 70–99)

## 2024-05-12 LAB — I-STAT CG4 LACTIC ACID, ED: Lactic Acid, Venous: 4.8 mmol/L (ref 0.5–1.9)

## 2024-05-12 LAB — ECHOCARDIOGRAM LIMITED
Height: 68 in
Weight: 3788.38 [oz_av]

## 2024-05-12 LAB — PHOSPHORUS
Phosphorus: 4 mg/dL (ref 2.5–4.6)
Phosphorus: 2.8 mg/dL (ref 2.5–4.6)

## 2024-05-12 LAB — TROPONIN I (HIGH SENSITIVITY)
Troponin I (High Sensitivity): 57 ng/L — ABNORMAL HIGH (ref <18)
Troponin I (High Sensitivity): 192 ng/L (ref <18)

## 2024-05-12 LAB — RESP PANEL BY RT-PCR (RSV, FLU A&B, COVID)  RVPGX2
Influenza A by PCR: NEGATIVE
Influenza B by PCR: NEGATIVE
Resp Syncytial Virus by PCR: NEGATIVE
SARS Coronavirus 2 by RT PCR: NEGATIVE

## 2024-05-12 LAB — MAGNESIUM
Magnesium: 2.8 mg/dL — ABNORMAL HIGH (ref 1.7–2.4)
Magnesium: 2.7 mg/dL — ABNORMAL HIGH (ref 1.7–2.4)

## 2024-05-12 LAB — MRSA NEXT GEN BY PCR, NASAL: MRSA by PCR Next Gen: NOT DETECTED

## 2024-05-12 MED ORDER — INSULIN ASPART 100 UNIT/ML IJ SOLN
0.0000 [IU] | INTRAMUSCULAR | Status: DC
  Administered 2024-05-12: 8 [IU] via SUBCUTANEOUS
  Administered 2024-05-12 (×2): 5 [IU] via SUBCUTANEOUS
  Administered 2024-05-12: 3 [IU] via SUBCUTANEOUS
  Administered 2024-05-12: 15 [IU] via SUBCUTANEOUS
  Administered 2024-05-13: 3 [IU] via SUBCUTANEOUS
  Administered 2024-05-13: 8 [IU] via SUBCUTANEOUS
  Administered 2024-05-13: 5 [IU] via SUBCUTANEOUS
  Filled 2024-05-12: qty 0.15

## 2024-05-12 MED ORDER — LACTATED RINGERS IV BOLUS
1000.0000 mL | Freq: Once | INTRAVENOUS | Status: AC
  Administered 2024-05-12: 1000 mL via INTRAVENOUS

## 2024-05-12 MED ORDER — PERFLUTREN LIPID MICROSPHERE
1.0000 mL | INTRAVENOUS | Status: AC | PRN
  Administered 2024-05-12: 5 mL via INTRAVENOUS

## 2024-05-12 MED ORDER — VITAL HIGH PROTEIN PO LIQD
1000.0000 mL | ORAL | Status: DC
  Filled 2024-05-12: qty 1000

## 2024-05-12 MED ORDER — SODIUM CHLORIDE 0.9 % IV SOLN
2.0000 g | Freq: Two times a day (BID) | INTRAVENOUS | Status: DC
  Administered 2024-05-12 – 2024-05-13 (×2): 2 g via INTRAVENOUS
  Filled 2024-05-12 (×2): qty 12.5

## 2024-05-12 MED ORDER — SODIUM CHLORIDE 0.9 % IV SOLN
250.0000 mL | INTRAVENOUS | Status: AC
  Administered 2024-05-12: 250 mL via INTRAVENOUS

## 2024-05-12 MED ORDER — POLYETHYLENE GLYCOL 3350 17 G PO PACK: 17.0000 g | PACK | Freq: Every day | ORAL | Status: DC | PRN

## 2024-05-12 MED ORDER — ETOMIDATE 2 MG/ML IV SOLN
INTRAVENOUS | Status: AC
  Filled 2024-05-12: qty 20

## 2024-05-12 MED ORDER — PIPERACILLIN-TAZOBACTAM 3.375 G IVPB 30 MIN
3.3750 g | Freq: Once | INTRAVENOUS | Status: AC
  Administered 2024-05-12: 3.375 g via INTRAVENOUS
  Filled 2024-05-12: qty 50

## 2024-05-12 MED ORDER — HEPARIN SODIUM (PORCINE) 5000 UNIT/ML IJ SOLN
5000.0000 [IU] | Freq: Three times a day (TID) | INTRAMUSCULAR | Status: DC
  Administered 2024-05-12 – 2024-05-13 (×4): 5000 [IU] via SUBCUTANEOUS
  Filled 2024-05-12 (×4): qty 1

## 2024-05-12 MED ORDER — SODIUM CHLORIDE 0.9 % IV BOLUS
1000.0000 mL | Freq: Once | INTRAVENOUS | Status: AC
  Administered 2024-05-12: 1000 mL via INTRAVENOUS

## 2024-05-12 MED ORDER — VANCOMYCIN HCL IN DEXTROSE 1-5 GM/200ML-% IV SOLN: 1000.0000 mg | Freq: Once | INTRAVENOUS | Status: DC

## 2024-05-12 MED ORDER — MIDAZOLAM HCL 2 MG/2ML IJ SOLN
INTRAMUSCULAR | Status: AC
  Filled 2024-05-12: qty 2

## 2024-05-12 MED ORDER — VANCOMYCIN HCL IN DEXTROSE 1-5 GM/200ML-% IV SOLN
1000.0000 mg | INTRAVENOUS | Status: DC
  Administered 2024-05-13: 1000 mg via INTRAVENOUS
  Filled 2024-05-12: qty 200

## 2024-05-12 MED ORDER — DOCUSATE SODIUM 100 MG PO CAPS: 100.0000 mg | ORAL_CAPSULE | Freq: Two times a day (BID) | ORAL | Status: DC | PRN

## 2024-05-12 MED ORDER — PROPOFOL 1000 MG/100ML IV EMUL
0.0000 ug/kg/min | INTRAVENOUS | Status: DC
  Administered 2024-05-12: 15 ug/kg/min via INTRAVENOUS
  Administered 2024-05-12 – 2024-05-13 (×3): 25 ug/kg/min via INTRAVENOUS
  Administered 2024-05-13: 35 ug/kg/min via INTRAVENOUS
  Filled 2024-05-12 (×5): qty 100

## 2024-05-12 MED ORDER — CHLORHEXIDINE GLUCONATE CLOTH 2 % EX PADS
6.0000 | MEDICATED_PAD | Freq: Every day | CUTANEOUS | Status: DC
  Administered 2024-05-12 – 2024-05-13 (×2): 6 via TOPICAL

## 2024-05-12 MED ORDER — SUCCINYLCHOLINE CHLORIDE 200 MG/10ML IV SOSY
PREFILLED_SYRINGE | INTRAVENOUS | Status: AC
  Filled 2024-05-12: qty 10

## 2024-05-12 MED ORDER — NOREPINEPHRINE 4 MG/250ML-% IV SOLN
0.0000 ug/min | INTRAVENOUS | Status: DC
  Administered 2024-05-12: 0.533 ug/min via INTRAVENOUS
  Administered 2024-05-12: 9 ug/min via INTRAVENOUS
  Administered 2024-05-12: 8 ug/min via INTRAVENOUS
  Administered 2024-05-13: 7 ug/min via INTRAVENOUS
  Administered 2024-05-13: 13 ug/min via INTRAVENOUS
  Filled 2024-05-12 (×5): qty 250

## 2024-05-12 MED ORDER — MIDAZOLAM HCL 2 MG/2ML IJ SOLN
2.0000 mg | Freq: Once | INTRAMUSCULAR | Status: AC
  Administered 2024-05-12: 2 mg via INTRAVENOUS

## 2024-05-12 MED ORDER — ROCURONIUM BROMIDE 10 MG/ML (PF) SYRINGE
PREFILLED_SYRINGE | INTRAVENOUS | Status: AC
  Filled 2024-05-12: qty 10

## 2024-05-12 MED ORDER — SODIUM BICARBONATE 8.4 % IV SOLN
INTRAVENOUS | Status: AC | PRN
  Administered 2024-05-12: 100 meq via INTRAVENOUS

## 2024-05-12 MED ORDER — PROSOURCE TF20 ENFIT COMPATIBL EN LIQD
60.0000 mL | Freq: Two times a day (BID) | ENTERAL | Status: DC
  Administered 2024-05-13: 60 mL
  Filled 2024-05-12: qty 60

## 2024-05-12 MED ORDER — POLYETHYLENE GLYCOL 3350 17 G PO PACK
17.0000 g | PACK | Freq: Every day | ORAL | Status: DC
  Administered 2024-05-12 – 2024-05-13 (×2): 17 g
  Filled 2024-05-12 (×2): qty 1

## 2024-05-12 MED ORDER — ORAL CARE MOUTH RINSE
15.0000 mL | OROMUCOSAL | Status: DC
  Administered 2024-05-12 – 2024-05-13 (×13): 15 mL via OROMUCOSAL

## 2024-05-12 MED ORDER — FAMOTIDINE 20 MG PO TABS
20.0000 mg | ORAL_TABLET | Freq: Two times a day (BID) | ORAL | Status: DC
  Administered 2024-05-12 – 2024-05-13 (×3): 20 mg
  Filled 2024-05-12 (×3): qty 1

## 2024-05-12 MED ORDER — VANCOMYCIN HCL 1750 MG/350ML IV SOLN
1750.0000 mg | Freq: Once | INTRAVENOUS | Status: AC
  Administered 2024-05-12: 1750 mg via INTRAVENOUS
  Filled 2024-05-12: qty 350

## 2024-05-12 MED ORDER — VITAL 1.5 CAL PO LIQD
1000.0000 mL | ORAL | Status: DC
  Administered 2024-05-12: 1000 mL
  Filled 2024-05-12 (×3): qty 1000

## 2024-05-12 MED ORDER — FENTANYL CITRATE PF 50 MCG/ML IJ SOSY: 25.0000 ug | PREFILLED_SYRINGE | INTRAMUSCULAR | Status: DC | PRN

## 2024-05-12 MED ORDER — FENTANYL CITRATE PF 50 MCG/ML IJ SOSY
25.0000 ug | PREFILLED_SYRINGE | INTRAMUSCULAR | Status: DC | PRN
  Administered 2024-05-12 (×3): 50 ug via INTRAVENOUS
  Administered 2024-05-12: 25 ug via INTRAVENOUS
  Administered 2024-05-12: 50 ug via INTRAVENOUS
  Administered 2024-05-12: 100 ug via INTRAVENOUS
  Administered 2024-05-12: 50 ug via INTRAVENOUS
  Administered 2024-05-13 (×3): 100 ug via INTRAVENOUS
  Administered 2024-05-13: 50 ug via INTRAVENOUS
  Administered 2024-05-13: 100 ug via INTRAVENOUS
  Administered 2024-05-13 (×2): 50 ug via INTRAVENOUS
  Administered 2024-05-13 (×2): 100 ug via INTRAVENOUS
  Administered 2024-05-13: 50 ug via INTRAVENOUS
  Filled 2024-05-12 (×4): qty 1
  Filled 2024-05-12 (×2): qty 2
  Filled 2024-05-12 (×4): qty 1
  Filled 2024-05-12 (×2): qty 2
  Filled 2024-05-12: qty 1
  Filled 2024-05-12: qty 2
  Filled 2024-05-12 (×2): qty 1
  Filled 2024-05-12: qty 2
  Filled 2024-05-12: qty 1
  Filled 2024-05-12: qty 2
  Filled 2024-05-12: qty 1

## 2024-05-12 MED ORDER — PROSOURCE TF20 ENFIT COMPATIBL EN LIQD
60.0000 mL | Freq: Every day | ENTERAL | Status: DC
  Administered 2024-05-12: 60 mL
  Filled 2024-05-12 (×2): qty 60

## 2024-05-12 MED ORDER — PROPOFOL 1000 MG/100ML IV EMUL
INTRAVENOUS | Status: AC
  Filled 2024-05-12: qty 100

## 2024-05-12 MED ORDER — EPINEPHRINE 1 MG/10ML IJ SOSY
PREFILLED_SYRINGE | INTRAMUSCULAR | Status: AC | PRN
  Administered 2024-05-12: 1 mg via INTRAVENOUS

## 2024-05-12 MED ORDER — ETOMIDATE 2 MG/ML IV SOLN
INTRAVENOUS | Status: AC | PRN
  Administered 2024-05-12: 20 mg via INTRAVENOUS

## 2024-05-12 MED ORDER — INSULIN GLARGINE-YFGN 100 UNIT/ML ~~LOC~~ SOLN
20.0000 [IU] | Freq: Every day | SUBCUTANEOUS | Status: DC
  Administered 2024-05-12 – 2024-05-13 (×2): 20 [IU] via SUBCUTANEOUS
  Filled 2024-05-12 (×2): qty 0.2

## 2024-05-12 MED ORDER — ORAL CARE MOUTH RINSE: 15.0000 mL | OROMUCOSAL | Status: DC | PRN

## 2024-05-12 MED ORDER — SUCCINYLCHOLINE CHLORIDE 20 MG/ML IJ SOLN
INTRAMUSCULAR | Status: AC | PRN
  Administered 2024-05-12: 150 mg via INTRAVENOUS

## 2024-05-12 MED ORDER — DOCUSATE SODIUM 50 MG/5ML PO LIQD
100.0000 mg | Freq: Two times a day (BID) | ORAL | Status: DC
  Administered 2024-05-12 – 2024-05-13 (×3): 100 mg
  Filled 2024-05-12 (×3): qty 10

## 2024-05-12 NOTE — Plan of Care (Signed)
  Problem: Tissue Perfusion: Goal: Adequacy of tissue perfusion will improve Outcome: Not Progressing   Problem: Education: Goal: Knowledge of General Education information will improve Description: Including pain rating scale, medication(s)/side effects and non-pharmacologic comfort measures Outcome: Not Progressing   Problem: Clinical Measurements: Goal: Ability to maintain clinical measurements within normal limits will improve Outcome: Not Progressing Goal: Will remain free from infection Outcome: Not Progressing Goal: Diagnostic test results will improve Outcome: Not Progressing   Problem: Nutrition: Goal: Adequate nutrition will be maintained Outcome: Not Progressing   Problem: Coping: Goal: Level of anxiety will decrease Outcome: Not Progressing   Problem: Elimination: Goal: Will not experience complications related to bowel motility Outcome: Not Progressing Goal: Will not experience complications related to urinary retention Outcome: Not Progressing   Problem: Pain Managment: Goal: General experience of comfort will improve and/or be controlled Outcome: Not Progressing

## 2024-05-12 NOTE — ED Notes (Signed)
 20mg  of etomidate and 150 succ given for RSI.

## 2024-05-12 NOTE — ED Triage Notes (Signed)
 Patient came in reso distress, duoneb, 125mg  solumedrol. Patient is diaphoretic.

## 2024-05-12 NOTE — ED Provider Notes (Signed)
 [place] EMERGENCY DEPARTMENT AT [facility] Provider Note   CSN: [id number] Arrival date & time: 05/12/24  0545     Patient presents with: Shortness of Breath   Jose Patrick is a 73 y.o. male.   HPI     This is a 73 year old male with a history of non-small cell lung cancer who presents in respiratory distress.  Per EMS they got called out for respiratory distress.  Patient began to have increasing shortness of breath approximately 1 hour ago.  History of COPD as well.  Was given a neb, Solu-Medrol , epi, mag.  Highest O2 sat was in the 80s on CPAP.  Upon arrival to the resuscitation bay, patient is gasping for breath and stating that he cannot breathe.  O2 sats are in the 70s on CPAP.  He is using accessory muscles.  I asked the patient if he had any advanced directives and he stated yes.  Patient clearly indicated that he wanted to be intubated although he was in distress.  Level 5 caveat.  Prior to Admission medications   Medication Sig Start Date End Date Taking? Authorizing Provider  acetaminophen  (TYLENOL ) 500 MG tablet Take 2 tablets (1,000 mg total) by mouth every 6 (six) hours as needed for mild pain (pain score 1-3) (Available over-the-counter). 05/08/24   Rai, Nydia POUR, MD  albuterol  (VENTOLIN  HFA) 108 (90 Base) MCG/ACT inhaler Inhale 2 puffs into the lungs every 6 (six) hours as needed for wheezing or shortness of breath. 03/19/22   Kassie Acquanetta Bradley, MD  ammonium lactate (LAC-HYDRIN) 12 % lotion Apply 1 Application topically as needed for dry skin.    [provider]  aspirin  81 MG chewable tablet Chew 1 tablet (81 mg total) by mouth daily. Patient not taking: Reported on 05/04/2024 12/19/11   Norleen Lynwood ORN, MD  atorvastatin  (LIPITOR) 40 MG tablet Take 1 tablet (40 mg total) by mouth daily. Patient taking differently: Take 40 mg by mouth every evening. 08/04/23   Thukkani, Arun K, MD  benzonatate  (TESSALON ) 200 MG capsule Take 1 capsule (200 mg  total) by mouth 3 (three) times daily as needed for cough. 05/08/24   Rai, Ripudeep K, MD  Cholecalciferol  (VITAMIN D3) 1000 units CAPS Take 1,000 Units by mouth daily.    [provider]  cycloSPORINE  (RESTASIS ) 0.05 % ophthalmic emulsion Place 1 drop into both eyes 2 (two) times daily. 01/12/24   [provider]  dextromethorphan -guaiFENesin  (TUSSIN DM) 10-100 MG/5ML liquid Take 5 mLs by mouth every 4 (four) hours as needed for cough. Patient not taking: Reported on 04/19/2024 03/19/24   Hope Almarie ORN, NP  docusate sodium  (COLACE) 100 MG capsule Take 100 mg by mouth daily as needed for mild constipation.    [provider]  ezetimibe  (ZETIA ) 10 MG tablet Take 10 mg by mouth daily. 12/09/23   [provider]  famotidine  (PEPCID ) 20 MG tablet Take 1 tablet (20 mg total) by mouth at bedtime. 04/07/24   Hummer Toribio GAILS, MD  feeding supplement (ENSURE ENLIVE / ENSURE PLUS) LIQD Take 237 mLs by mouth 2 (two) times daily between meals. Patient not taking: Reported on 04/19/2024 04/07/24   Hummer Toribio GAILS, MD  ferrous sulfate  325 (65 FE) MG EC tablet Take 325 mg by mouth in the morning and at bedtime.    [provider]  finasteride  (PROSCAR ) 5 MG tablet Take 1 tablet (5 mg total) by mouth daily. 05/03/24   Vann, Jessica U, DO  fluticasone  (  FLONASE ) 50 MCG/ACT nasal spray Place 2 sprays into both nostrils in the morning. 08/11/23   [provider]  fluticasone -salmeterol (WIXELA INHUB) 250-50 MCG/ACT AEPB ONE puff in the in morning and ONE puff in the evening Patient taking differently: Inhale 1 puff into the lungs in the morning. 01/14/24   Kassie Acquanetta Bradley, MD  furosemide  (LASIX ) 40 MG tablet Take 1 tablet (40 mg total) by mouth daily. Take Lasix  1 tablet (40mg ) twice a day for 3 days, then continue daily. 05/08/24   Rai, Nydia POUR, MD  gabapentin  (NEURONTIN ) 400 MG capsule Take 1 capsule (400 mg total) by mouth 3 (three) times daily. 05/08/24   Rai,  Nydia POUR, MD  glucose 4 GM chewable tablet Chew 1 tablet by mouth as needed for low blood sugar.    [provider]  Guaifenesin  1200 MG TB12 Take 1 tablet (1,200 mg total) by mouth 2 (two) times daily. 05/08/24   Rai, Ripudeep POUR, MD  guaiFENesin -codeine  100-10 MG/5ML syrup Take 5 mLs by mouth 2 (two) times daily as needed for cough. 05/08/24   Rai, Nydia POUR, MD  insulin  glargine (LANTUS ) 100 UNIT/ML injection Inject 0.4 mLs (40 Units total) into the skin daily. 05/08/24   Rai, Ripudeep K, MD  ipratropium-albuterol  (DUONEB) 0.5-2.5 (3) MG/3ML SOLN Take 3 mLs by nebulization 3 (three) times daily for 5 days, THEN 3 mLs every 6 (six) hours as needed. 05/08/24 05/13/25  Rai, Nydia POUR, MD  JARDIANCE  25 MG TABS tablet Take 25 mg by mouth at bedtime.    [provider]  losartan  (COZAAR ) 25 MG tablet Take 12.5 mg by mouth daily. 01/01/23   [provider]  Magnesium  Oxide 420 MG TABS Take 420 mg by mouth daily with breakfast.    [provider]  metFORMIN  (GLUCOPHAGE ) 1000 MG tablet Take 1,000 mg by mouth 2 (two) times daily. 09/14/21   [provider]  metoprolol  succinate (TOPROL -XL) 100 MG 24 hr tablet Take 100 mg by mouth daily. Take with or immediately following a meal.    [provider]  omeprazole  (PRILOSEC) 40 MG capsule Take 1 capsule (40 mg total) by mouth daily. Patient taking differently: Take 40 mg by mouth daily before breakfast. 04/07/24   Sebastian Toribio GAILS, MD  oxyCODONE  (OXY IR/ROXICODONE ) 5 MG immediate release tablet Take 0.5 tablets (2.5 mg total) by mouth every 4 (four) hours as needed for moderate pain (pain score 4-6) (shortness of breath). 05/08/24   Rai, Nydia POUR, MD  OXYGEN Inhale 2 L/min into the lungs continuous.    [provider]  predniSONE  (DELTASONE ) 10 MG tablet Prednisone  dosing: Take  Prednisone  40mg  (4 tabs) x 4 days, then taper to 30mg  (3 tabs) x 4 days, then 20mg  (2 tabs) x 4days, then 10mg  (1 tab) x  4days, then OFF. 05/08/24   Rai, Ripudeep K, MD  psyllium (METAMUCIL SMOOTH TEXTURE) 58.6 % powder Take 1 packet by mouth daily. Patient not taking: Reported on 04/19/2024 12/20/21   Jinny Carmine, MD  pyridoxine  (B-6) 100 MG tablet Take 100 mg by mouth daily.    [provider]  ranolazine  (RANEXA ) 500 MG 12 hr tablet Take 1 tablet (500 mg total) by mouth 2 (two) times daily. 02/11/23   Hobart Powell BRAVO, MD  Semaglutide , 2 MG/DOSE, 8 MG/3ML SOPN Inject 2 mg into the skin every Sunday. 02/14/24   [provider]  sildenafil (VIAGRA) 100 MG tablet Take 100 mg by mouth daily as needed for erectile  dysfunction.    [provider]  SYSTANE BALANCE 0.6 % SOLN Place 1 drop into both eyes in the morning.    [provider]  Testosterone 1.62 % GEL Place 2 Pump onto the skin in the morning.    [provider]  Tiotropium Bromide  Monohydrate (SPIRIVA  RESPIMAT) 2.5 MCG/ACT AERS Inhale 2 puffs into the lungs daily. 01/14/24   Kassie Acquanetta Bradley, MD  triamcinolone ointment (KENALOG) 0.1 % Apply 1 Application topically 2 (two) times daily.    [provider]    Allergies: Chlorzoxazone and Tramadol    Review of Systems  Unable to perform ROS: Acuity of condition  Respiratory:  Positive for shortness of breath.     Updated Vital Signs BP 97/73   Pulse (!) 128   Resp (!) 27   Ht 1.727 m (5' 8)   SpO2 94%   BMI 36.71 kg/m   Physical Exam Vitals and nursing note reviewed.  Constitutional:      General: He is in acute distress.     Appearance: He is obese. He is ill-appearing and toxic-appearing.  HENT:     Head: Normocephalic and atraumatic.     Mouth/Throat:     Mouth: Mucous membranes are moist.   Eyes:     Pupils: Pupils are equal, round, and reactive to light.    Cardiovascular:     Rate and Rhythm: Normal rate and regular rhythm.     Heart sounds: Normal heart sounds. No murmur heard. Pulmonary:     Effort: Tachypnea and accessory  muscle usage present. No respiratory distress.     Breath sounds: Decreased breath sounds present.     Comments: Diminished lung sounds in all lung fields, on CPAP, speaking in short sentences, air hungry, accessory muscle use Abdominal:     General: Bowel sounds are normal.     Palpations: Abdomen is soft.     Tenderness: There is no abdominal tenderness. There is no rebound.   Musculoskeletal:     Cervical back: Neck supple.     Right lower leg: Edema present.     Left lower leg: Edema present.  Lymphadenopathy:     Cervical: No cervical adenopathy.   Skin:    General: Skin is warm and dry.   Neurological:     Mental Status: He is alert and oriented to person, place, and time.     (all labs ordered are listed, but only abnormal results are displayed) Labs Reviewed  CBC WITH DIFFERENTIAL/PLATELET - Abnormal; Notable for the following components:      Result Value   WBC 18.3 (*)    RBC 6.01 (*)    HCT 52.1 (*)    RDW 15.7 (*)    Neutro Abs 13.9 (*)    Monocytes Absolute 1.3 (*)    Abs Immature Granulocytes 0.33 (*)    All other components within normal limits  URINALYSIS, ROUTINE W REFLEX MICROSCOPIC - Abnormal; Notable for the following components:   Glucose, UA >=500 (*)    Hgb urine dipstick SMALL (*)    All other components within normal limits  COMPREHENSIVE METABOLIC PANEL WITH GFR - Abnormal; Notable for the following components:   Sodium 131 (*)    Chloride 92 (*)    Glucose, Bld 422 (*)    BUN 35 (*)    Creatinine, Ser 1.68 (*)    Calcium  8.3 (*)    Total Protein 5.6 (*)    Albumin 2.4 (*)  AST 50 (*)    ALT 55 (*)    GFR, Estimated 43 (*)    All other components within normal limits  BLOOD GAS, ARTERIAL - Abnormal; Notable for the following components:   pH, Arterial 7.15 (*)    pCO2 arterial 86 (*)    Bicarbonate 30.3 (*)    All other components within normal limits  I-STAT CG4 LACTIC ACID, ED - Abnormal; Notable for the following components:    Lactic Acid, Venous 4.8 (*)    All other components within normal limits  TROPONIN I (HIGH SENSITIVITY) - Abnormal; Notable for the following components:   Troponin I (High Sensitivity) 57 (*)    All other components within normal limits  CULTURE, BLOOD (ROUTINE X 2)  CULTURE, BLOOD (ROUTINE X 2)    EKG: None  Radiology: DG Abdomen 1 View Result Date: 05/12/2024 CLINICAL DATA:  Evaluate enteric tube placement. EXAM: ABDOMEN - 1 VIEW COMPARISON:  05/03/2024 FINDINGS: Enteric tube tip and side port are in the gastric fundus. Again seen are bilateral pulmonary opacities, right greater than left. Paucity of bowel gas noted within the upper abdomen. Postsurgical changes in the lumbar spine. IMPRESSION: Enteric tube tip and side port are in the gastric fundus. Electronically Signed   By: Waddell Calk M.D.   On: 05/12/2024 06:43   DG Chest Portable 1 View Result Date: 05/12/2024 CLINICAL DATA:  Bilateral airspace disease, now ventilator dependent, check for ETT positioning. EXAM: PORTABLE CHEST 1 VIEW COMPARISON:  Last portable chest was 05/03/2024 FINDINGS: 6:17 a.m. ETT interval insertion with tip 3.7 cm from the carina. There is an overlying electrical pad, overlying telemetry leads. Extensive bilateral airspace disease continues to be seen, and again is most dense and most confluent on the right, with increasing density of patchy airspace disease today in the left upper lobe. There are at least small pleural effusions, greater on the right where there is obscuration of the hemidiaphragm. Heart is enlarged. Central vessels are difficult to see but may be slightly prominent on the left. The mediastinum is stable. There is aortic atherosclerosis. No new skeletal findings. IMPRESSION: 1. ETT tip 3.7 cm from the carina. 2. Extensive right greater than left airspace disease, with increasing density of patchy airspace disease today in the left upper lobe. The bilateral opacities are otherwise not notably  changed. 3. Right greater than left pleural effusions. 4. Cardiomegaly. 5. Aortic atherosclerosis. Electronically Signed   By: Francis Quam M.D.   On: 05/12/2024 06:40     .Critical Care  Performed by: Bari Charmaine FALCON, MD Authorized by: Bari Charmaine FALCON, MD   Critical care provider statement:    Critical care time (minutes):  45   Critical care was necessary to treat or prevent imminent or life-threatening deterioration of the following conditions:  Respiratory failure   Critical care was time spent personally by me on the following activities:  Development of treatment plan with patient or surrogate, discussions with consultants, evaluation of patient's response to treatment, examination of patient, ordering and review of laboratory studies, ordering and review of radiographic studies, ordering and performing treatments and interventions, pulse oximetry, re-evaluation of patient's condition and review of old charts Procedure Name: Intubation Date/Time: 05/12/2024 6:22 AM  Performed by: Bari Charmaine FALCON, MDPre-anesthesia Checklist: Patient identified Oxygen Delivery Method: Ambu bag Preoxygenation: Preoxygenation mid 70s with positive pressure. Induction Type: Rapid sequence Laryngoscope Size: Glidescope and 4 Grade View: Grade I Tube size: 7.5 mm Number of attempts: 1 Placement Confirmation: ETT  inserted through vocal cords under direct vision, CO2 detector and Breath sounds checked- equal and bilateral Tube secured with: ETT holder Difficulty Due To: Difficulty was anticipated Comments: Patient desatted into the 50s with intubation, had a brief Perry intubation arrest.  See additional note for details.    CPR  Date/Time: 05/12/2024 6:23 AM  Performed by: Bari Charmaine FALCON, MD Authorized by: Bari Charmaine FALCON, MD  CPR Procedure Details:      Amount of time prior to administration of ACLS/BLS (minutes):  10   ACLS/BLS initiated by EMS: No     CPR/ACLS performed in the  ED: Yes     Duration of CPR (minutes):  10   Outcome: ROSC obtained    CPR performed via ACLS guidelines under my direct supervision.  See RN documentation for details including defibrillator use, medications, doses and timing.    Medications Ordered in the ED  propofol  (DIPRIVAN ) 1000 MG/100ML infusion ( Intravenous New Bag/Given 05/12/24 0620)  succinylcholine (ANECTINE) injection ( Intravenous Canceled Entry 05/12/24 0615)  etomidate (AMIDATE) injection ( Intravenous Canceled Entry 05/12/24 0615)  EPINEPHrine (ADRENALIN) 1 MG/10ML injection (1 mg Intravenous Given 05/12/24 0602)  propofol  (DIPRIVAN ) 1000 MG/100ML infusion (7.645 mcg/kg/min  Rate/Dose Change 05/12/24 0636)  sodium bicarbonate injection (100 mEq Intravenous Given 05/12/24 0605)  midazolam (VERSED) injection 2 mg ( Intravenous Not Given 05/12/24 0718)  sodium chloride  0.9 % bolus 1,000 mL (0 mLs Intravenous Stopped 05/12/24 0717)    Clinical Course as of 05/12/24 0732  Wed May 12, 2024  0615 Chart reviewed.  Patient previously had a DNR but clearly stated he wanted to be intubated.  This may have been related to his acute distress.  Patient did have a brief period intubation arrest likely related to hypoxia.  Initial O2 sats 64%.  Patient received 1 round of CPR with ROSC.  Critical care consulted.  Spoke with Dr. Volanda who will have the day team evaluate the patient. [CH]  [phone] Family updated on patient's status.  Aware that he is intubated.  Full code until further discussion with inpatient team.  Per patient's wife, he was due to see oncology on Thursday. [CH]    Clinical Course User Index [CH] Bonham Zingale, Charmaine FALCON, MD                                 Medical Decision Making Amount and/or Complexity of Data Reviewed Labs: ordered. Radiology: ordered.  Risk Prescription drug management. Decision regarding hospitalization.   This patient presents to the ED for concern of respiratory distress, this involves an extensive  number of treatment options, and is a complaint that carries with it a high risk of complications and morbidity.  I considered the following differential and admission for this acute, potentially life threatening condition.  The differential diagnosis includes hypoxic respiratory failure hypercarbic respiratory failure due to known lung cancer, COPD, CHF, pneumonia  MDM:    This is a 73 year old male who presents with respiratory distress and failure.  He presents in extremis.  He is able to speak in short sentences but clearly is in distress.  Indicated intubation.  Proceeded.  Patient started out hypoxic and had a period intubation arrest requiring a brief period of CPR.  He subsequently had ROSC.  Respiratory acidosis noted on his ABG.  Lactic acidosis likely related to acute presentation.  Critical care was consulted.  Family was updated.  X-ray was reviewed and shows  good apparatus placement.  Care handed off to critical care.  (Labs, imaging, consults)  Labs: I Ordered, and personally interpreted labs.  The pertinent results include: CBC, CMP, blood cultures, lactate, ABG  Imaging Studies ordered: I ordered imaging studies including x-ray I independently visualized and interpreted imaging. I agree with the radiologist interpretation  Additional history obtained from EMS, family.  External records from outside source obtained and reviewed including discharge summary  Cardiac Monitoring: The patient was maintained on a cardiac monitor.  If on the cardiac monitor, I personally viewed and interpreted the cardiac monitored which showed an underlying rhythm of: Tachycardia  Reevaluation: After the interventions noted above, I reevaluated the patient and found that they have :Critical condition  Social Determinants of Health:  lives independently  Disposition: Admit  Co morbidities that complicate the patient evaluation  Past Medical History:  Diagnosis Date   ALLERGIC RHINITIS  10/08/2007   Qualifier: Diagnosis of  By: Norleen MD, Lynwood ORN    BENIGN PROSTATIC HYPERTROPHY 09/28/2010   Qualifier: Diagnosis of  By: Norleen MD, Lynwood ORN    Bipolar affective disorder (HCC) 09/30/2012   COLONIC POLYPS, HX OF 10/08/2007   Qualifier: Diagnosis of  By: Norleen MD, Lynwood ORN    CONSTIPATION, CHRONIC, HX OF 08/10/2007   Qualifier: Diagnosis of  By: Helga Almarie Caldron    DIABETES MELLITUS, TYPE II 10/08/2007   Qualifier: Diagnosis of  By: Norleen MD, Lynwood ORN    ERECTILE DYSFUNCTION 10/08/2007   Qualifier: Diagnosis of  By: Norleen MD, Lynwood ORN    HYPERLIPIDEMIA 10/08/2007   Qualifier: Diagnosis of  By: Norleen MD, Lynwood ORN    HYPERTENSION 03/20/2009   Qualifier: Diagnosis of  By: Norleen MD, Lynwood ORN    Non-small cell lung cancer (NSCLC) [facility])    Widespread bilateral pulmonary nodules   PTSD (post-traumatic stress disorder) 09/30/2012   SPINAL STENOSIS, LUMBAR 10/08/2007   Per Dr Mavis - [facility]   Qualifier: Diagnosis of  By: Norleen MD, Lynwood ORN       Medicines Meds ordered this encounter  Medications   succinylcholine (ANECTINE) injection   etomidate (AMIDATE) injection   EPINEPHrine (ADRENALIN) 1 MG/10ML injection   propofol  (DIPRIVAN ) 1000 MG/100ML infusion    Vicci, Juanique A: cabinet override   sodium bicarbonate injection   succinylcholine (ANECTINE) 200 MG/10ML syringe    Preyer, Elizabeth A: cabinet override   etomidate (AMIDATE) 2 MG/ML injection    Preyer, Elizabeth A: cabinet override   DISCONTD: rocuronium  (ZEMURON ) 100 MG/10ML injection    Preyer, Elizabeth A: cabinet override   midazolam (VERSED) injection 2 mg   midazolam (VERSED) 2 MG/2ML injection    Vicci, Juanique A: cabinet override   sodium chloride  0.9 % bolus 1,000 mL   propofol  (DIPRIVAN ) 1000 MG/100ML infusion    I have reviewed the patients home medicines and have made adjustments as needed  Problem List / ED Course: Problem List Items Addressed This Visit   None Visit Diagnoses        Acute on chronic respiratory failure with hypoxia and hypercapnia (HCC)    -  Primary                Final diagnoses:  Acute on chronic respiratory failure with hypoxia and hypercapnia [facility])    ED Discharge Orders     None          Bari Charmaine FALCON, MD 05/12/24 [phone]

## 2024-05-12 NOTE — Progress Notes (Signed)
 Per patient's wife, [name], verbal ok to have patient's other daughter Jose Patrick to visit the patient as long as visitor is appropriate at bedside with patient and with staff.

## 2024-05-12 NOTE — Progress Notes (Signed)
   05/12/24 1015  Spiritual Encounters  Type of Visit Initial  Care provided to: Family  Referral source Non-clinical staff  Reason for visit Urgent spiritual support   While rounding on unit, Alverna informed me of recent arrival of [name] and presence of his wife and family in waiting area. Juliene April also present with family.  I met his wife of 52 yrs, [name], and offered her and family my presence and support. They asked for prayer and we joined together to pray. I offered hospitality and let them know nursing staff can page spiritual care as needed. Will continue to follow.  Girlie Veltri L. Fredrica, M.Div [phone]

## 2024-05-12 NOTE — H&P (Signed)
 NAME:  Jose Patrick, MRN:  [id number], DOB:  Oct 21, 1951, LOS: 0 ADMISSION DATE:  05/12/2024, CONSULTATION DATE:  05/12/2024 REFERRING MD:  Dr. Bari - EDP, CHIEF COMPLAINT:  Respiratory distress    History of Present Illness:  Jose Patrick is a 73 year old male with a past medical history significant for stage IV NSC lung cancer, COPD, HTN, HLD, type 2 diabetes, and BPH presented to the ED at [facility] for sudden onset shortness of breath.  On EMS arrival patient was satting 80% CPAP.  Acute respiratory distress on arrival patient was emergently intubated in the ED. During intubation patient briefly became pulseless and required ACLS protocol for approximately 2 mins. Labor significant for Na 131, Cl 92, glucose 422, creatinine 1.68, BUN 35, AST 50, ALT 55, HS troponin 57, lactic 4.8, WBC 18.3. CXR with extensive right greater than left airspace disease with increased density in the LUL.   Pertinent  Medical History  Stage IV NSC lung cancer, COPD, HTN, HLD, type 2 diabetes, and BPH   Significant Hospital Events: Including procedures, antibiotic start and stop dates in addition to other pertinent events   05/12/2024 Admitted with sudden onset SOB, intubated emergently in ED, became pulseless prior to intubation   Interim History / Subjective:  As above   Objective    Blood pressure 97/73, pulse (!) 128, resp. rate (!) 27, height 5' 8 (1.727 m), SpO2 94%.    Vent Mode: PRVC FiO2 (%):  [100 %] 100 % Set Rate:  [20 bmp-28 bmp] 28 bmp Vt Set:  [540 mL] 540 mL PEEP:  [5 cmH20] 5 cmH20 Plateau Pressure:  [34 cmH20] 34 cmH20   Intake/Output Summary (Last 24 hours) at 05/12/2024 0659 Last data filed at 05/12/2024 9361 Gross per 24 hour  Intake --  Output 30 ml  Net -30 ml   There were no vitals filed for this visit.  Examination: General: Acute on chronic ill-appearing elderly male lying in bed mechanical ventilation no acute distress HEENT: ETT, MM pink/moist, PERRL,   Neuro: Sedated on vent  CV: s1s2 regular rate and rhythm, no murmur, rubs, or gallops,  PULM:  Bilateral rhonchi, tolerating vent, no increased work of breathing  GI: soft, bowel sounds active in all 4 quadrants, non-tender, non-distended Extremities: warm/dry, no edema  Skin: no rashes or lesions  Resolved problem list   Assessment and Plan   Acute hypoxic  and hypercapnic respiratory failure  -Patient utilizes 5L [place] at baseline  COPD P: Continue ventilator support with lung protective strategies  Wean PEEP and FiO2 for sats greater than 90%. Head of bed elevated 30 degrees. Plateau pressures less than 30 cm H20.  Follow intermittent chest x-ray and ABG.   SAT/SBT as tolerated, mentation preclude extubation  Ensure adequate pulmonary hygiene  Follow cultures  VAP bundle in place  PAD protocol Obtain BAL  Cefepime  and Vancomycin   Stage IV NSC lung cancer -Appears patient had a suspicious pulmonary nodule March 2024 for which a bronch was preformed and was negative at [facility] but given high suspicion empiric radiation therapy was started fall 2024 given hypermetabolic PET  -Post radiation treatment CT December 2024 revealed enlargement of nodule worrisome for progression.  -January PET was not active indicating radiation therapy was successful  -June 5th 2025 underwent Bronch with Dr. Shelah which was positive for non small cell lung cancer in all 6 sites biopsied  P: Oncology management  Supportive care as above   In-hospital cardiac arrest - Per  ED RN report appears patient suffered a brief 2-minute cardiac arrest just prior to intubation, likely respiratory mediated -Echocardiogram 05/04/24 with a EF of 55 to 60%, no WMA, normal RV function P: Continuous telemetry Repeat limited echo Strict intake and output Optimize electrolytes Vent support as above  Essential hypertension  Hyperlipidemia   -Home medications include Lipitor, Lasix , Cozaar , Toprol -XL,  P: Home  medications given hypotension Continuous telemetry  Type 2 diabetes with hyperglycemia  -Home medications include Semaglutide   P: SSI CBG checks every 4 CBG goal 140-180  BPH  P: Resume home medications when appropriate   Mildly elevated LFTs  P: Monitor   At risk malnutrition  P:  Optimize nutrition  Supplement protein   Best Practice (right click and Reselect all SmartList Selections daily)   Diet/type: NPO DVT prophylaxis prophylactic heparin  Pressure ulcer(s): N/A GI prophylaxis: PPI Lines: N/A Foley:  Yes, and it is still needed Code Status:  full code  Last date of multidisciplinary goals of care discussion: Pending   Labs   CBC: Recent Labs  Lab 05/12/24 0626  WBC 18.3*  NEUTROABS 13.9*  HGB 15.7  HCT 52.1*  MCV 86.7  PLT 241    Basic Metabolic Panel: No results for input(s): NA, K, CL, CO2, GLUCOSE, BUN, CREATININE, CALCIUM , MG, PHOS in the last 168 hours. GFR: Estimated Creatinine Clearance: 70.3 mL/min (by C-G formula based on SCr of 1.14 mg/dL). Recent Labs  Lab 05/12/24 0626 05/12/24 0628  WBC 18.3*  --   LATICACIDVEN  --  4.8*    Liver Function Tests: No results for input(s): AST, ALT, ALKPHOS, BILITOT, PROT, ALBUMIN in the last 168 hours. No results for input(s): LIPASE, AMYLASE in the last 168 hours. No results for input(s): AMMONIA in the last 168 hours.  ABG    Component Value Date/Time   PHART 7.15 (LL) 05/12/2024 0626   PCO2ART 86 (HH) 05/12/2024 0626   PO2ART 88 05/12/2024 0626   HCO3 30.3 (H) 05/12/2024 0626   TCO2 28 05/03/2024 1648   ACIDBASEDEF 1.0 05/12/2024 0626   O2SAT 96.2 05/12/2024 0626     Coagulation Profile: No results for input(s): INR, PROTIME in the last 168 hours.  Cardiac Enzymes: No results for input(s): CKTOTAL, CKMB, CKMBINDEX, TROPONINI in the last 168 hours.  HbA1C: Hgb A1c MFr Bld  Date/Time Value Ref Range Status  04/03/2024 03:52 AM  7.8 (H) 4.8 - 5.6 % Final    Comment:    (NOTE) Pre diabetes:          5.7%-6.4%  Diabetes:              >6.4%  Glycemic control for   <7.0% adults with diabetes   09/30/2012 10:31 AM 7.6 (H) 4.6 - 6.5 % Final    Comment:    Glycemic Control Guidelines for People with Diabetes:Non Diabetic:  <6%Goal of Therapy: <7%Additional Action Suggested:  >8%     CBG: Recent Labs  Lab 05/07/24 1205 05/07/24 1642 05/07/24 2154 05/08/24 0716 05/08/24 1140  GLUCAP 302* 200* 262* 188* 279*    Review of Systems:   Unable to assess   Past Medical History:  He,  has a past medical history of ALLERGIC RHINITIS (10/08/2007), BENIGN PROSTATIC HYPERTROPHY (09/28/2010), Bipolar affective disorder (HCC) (09/30/2012), COLONIC POLYPS, HX OF (10/08/2007), CONSTIPATION, CHRONIC, HX OF (08/10/2007), DIABETES MELLITUS, TYPE II (10/08/2007), ERECTILE DYSFUNCTION (10/08/2007), HYPERLIPIDEMIA (10/08/2007), HYPERTENSION (03/20/2009), Non-small cell lung cancer (NSCLC) (HCC), PTSD (post-traumatic stress disorder) (09/30/2012), and SPINAL STENOSIS, LUMBAR (10/08/2007).  Surgical History:   Past Surgical History:  Procedure Laterality Date   COLONOSCOPY WITH PROPOFOL  N/A 04/09/2021   Procedure: COLONOSCOPY WITH PROPOFOL ;  Surgeon: Janalyn Keene NOVAK, MD;  Location: [facility] ENDOSCOPY;  Service: Endoscopy;  Laterality: N/A;   cyst off right buttock     ESOPHAGOGASTRODUODENOSCOPY (EGD) WITH PROPOFOL  N/A 04/09/2021   Procedure: ESOPHAGOGASTRODUODENOSCOPY (EGD) WITH PROPOFOL ;  Surgeon: Janalyn Keene NOVAK, MD;  Location: [facility] ENDOSCOPY;  Service: Endoscopy;  Laterality: N/A;   PREPATELLAR BURSA EXCISION     removal right   s/p bilat hydrocelectomy  2008   x 2   TONSILLECTOMY     VIDEO BRONCHOSCOPY Right 04/22/2024   Procedure: BRONCHOSCOPY, WITH FLUOROSCOPY;  Surgeon: Shelah Lamar RAMAN, MD;  Location: [facility] ENDOSCOPY;  Service: Cardiopulmonary;  Laterality: Right;     Social History:   reports that he quit smoking  about 12 years ago. His smoking use included cigarettes. He started smoking about 53 years ago. He has a 61.5 pack-year smoking history. He has never used smokeless tobacco. He reports current alcohol  use. He reports that he does not use drugs.   Family History:  His family history includes Allergies in his sister; Anxiety disorder in his mother; Cancer in his mother; Depression in his mother; Ulcers in his mother.   Allergies Allergies  Allergen Reactions   Chlorzoxazone Swelling and Other (See Comments)    Psychosis  (Parafon Forte) ABSOLUTELY CANNOT HAVE THIS!!   Tramadol Other (See Comments)    Muscles Tightness      Home Medications  Prior to Admission medications   Medication Sig Start Date End Date Taking? Authorizing Provider  acetaminophen  (TYLENOL ) 500 MG tablet Take 2 tablets (1,000 mg total) by mouth every 6 (six) hours as needed for mild pain (pain score 1-3) (Available over-the-counter). 05/08/24   Rai, Nydia POUR, MD  albuterol  (VENTOLIN  HFA) 108 (90 Base) MCG/ACT inhaler Inhale 2 puffs into the lungs every 6 (six) hours as needed for wheezing or shortness of breath. 03/19/22   Kassie Acquanetta Bradley, MD  ammonium lactate (LAC-HYDRIN) 12 % lotion Apply 1 Application topically as needed for dry skin.    [provider]  aspirin  81 MG chewable tablet Chew 1 tablet (81 mg total) by mouth daily. Patient not taking: Reported on 05/04/2024 12/19/11   Norleen Lynwood ORN, MD  atorvastatin  (LIPITOR) 40 MG tablet Take 1 tablet (40 mg total) by mouth daily. Patient taking differently: Take 40 mg by mouth every evening. 08/04/23   Thukkani, Arun K, MD  benzonatate  (TESSALON ) 200 MG capsule Take 1 capsule (200 mg total) by mouth 3 (three) times daily as needed for cough. 05/08/24   Rai, Ripudeep POUR, MD  Cholecalciferol  (VITAMIN D3) 1000 units CAPS Take 1,000 Units by mouth daily.    [provider]  cycloSPORINE  (RESTASIS ) 0.05 % ophthalmic emulsion Place 1 drop into both eyes 2 (two)  times daily. 01/12/24   [provider]  dextromethorphan -guaiFENesin  (TUSSIN DM) 10-100 MG/5ML liquid Take 5 mLs by mouth every 4 (four) hours as needed for cough. Patient not taking: Reported on 04/19/2024 03/19/24   Hope Almarie ORN, NP  docusate sodium  (COLACE) 100 MG capsule Take 100 mg by mouth daily as needed for mild constipation.    [provider]  ezetimibe  (ZETIA ) 10 MG tablet Take 10 mg by mouth daily. 12/09/23   [provider]  famotidine  (PEPCID ) 20 MG tablet Take 1 tablet (20 mg total) by mouth at bedtime. 04/07/24   Sebastian Toribio GAILS,  MD  feeding supplement (ENSURE ENLIVE / ENSURE PLUS) LIQD Take 237 mLs by mouth 2 (two) times daily between meals. Patient not taking: Reported on 04/19/2024 04/07/24   Sebastian Toribio GAILS, MD  ferrous sulfate  325 (65 FE) MG EC tablet Take 325 mg by mouth in the morning and at bedtime.    [provider]  finasteride  (PROSCAR ) 5 MG tablet Take 1 tablet (5 mg total) by mouth daily. 05/03/24   Vann, Jessica U, DO  fluticasone  (FLONASE ) 50 MCG/ACT nasal spray Place 2 sprays into both nostrils in the morning. 08/11/23   [provider]  fluticasone -salmeterol (WIXELA INHUB) 250-50 MCG/ACT AEPB ONE puff in the in morning and ONE puff in the evening Patient taking differently: Inhale 1 puff into the lungs in the morning. 01/14/24   Kassie Acquanetta Bradley, MD  furosemide  (LASIX ) 40 MG tablet Take 1 tablet (40 mg total) by mouth daily. Take Lasix  1 tablet (40mg ) twice a day for 3 days, then continue daily. 05/08/24   Rai, Ripudeep K, MD  gabapentin  (NEURONTIN ) 400 MG capsule Take 1 capsule (400 mg total) by mouth 3 (three) times daily. 05/08/24   Rai, Nydia POUR, MD  glucose 4 GM chewable tablet Chew 1 tablet by mouth as needed for low blood sugar.    [provider]  Guaifenesin  1200 MG TB12 Take 1 tablet (1,200 mg total) by mouth 2 (two) times daily. 05/08/24   Rai, Nydia POUR, MD  guaiFENesin -codeine  100-10 MG/5ML syrup  Take 5 mLs by mouth 2 (two) times daily as needed for cough. 05/08/24   Rai, Nydia POUR, MD  insulin  glargine (LANTUS ) 100 UNIT/ML injection Inject 0.4 mLs (40 Units total) into the skin daily. 05/08/24   Rai, Ripudeep POUR, MD  ipratropium-albuterol  (DUONEB) 0.5-2.5 (3) MG/3ML SOLN Take 3 mLs by nebulization 3 (three) times daily for 5 days, THEN 3 mLs every 6 (six) hours as needed. 05/08/24 05/13/25  Rai, Nydia POUR, MD  JARDIANCE  25 MG TABS tablet Take 25 mg by mouth at bedtime.    [provider]  losartan  (COZAAR ) 25 MG tablet Take 12.5 mg by mouth daily. 01/01/23   [provider]  Magnesium  Oxide 420 MG TABS Take 420 mg by mouth daily with breakfast.    [provider]  metFORMIN  (GLUCOPHAGE ) 1000 MG tablet Take 1,000 mg by mouth 2 (two) times daily. 09/14/21   [provider]  metoprolol  succinate (TOPROL -XL) 100 MG 24 hr tablet Take 100 mg by mouth daily. Take with or immediately following a meal.    [provider]  omeprazole  (PRILOSEC) 40 MG capsule Take 1 capsule (40 mg total) by mouth daily. Patient taking differently: Take 40 mg by mouth daily before breakfast. 04/07/24   Sebastian Toribio GAILS, MD  oxyCODONE  (OXY IR/ROXICODONE ) 5 MG immediate release tablet Take 0.5 tablets (2.5 mg total) by mouth every 4 (four) hours as needed for moderate pain (pain score 4-6) (shortness of breath). 05/08/24   Rai, Nydia POUR, MD  OXYGEN Inhale 2 L/min into the lungs continuous.    [provider]  predniSONE  (DELTASONE ) 10 MG tablet Prednisone  dosing: Take  Prednisone  40mg  (4 tabs) x 4 days, then taper to 30mg  (3 tabs) x 4 days, then 20mg  (2 tabs) x 4days, then 10mg  (1 tab) x 4days, then OFF. 05/08/24   Rai, Ripudeep K, MD  psyllium (METAMUCIL SMOOTH TEXTURE) 58.6 % powder Take 1 packet by mouth daily. Patient not taking: Reported on 04/19/2024 12/20/21   Jinny Carmine,  MD  pyridoxine  (B-6) 100 MG tablet Take 100 mg by mouth daily.    [provider]   ranolazine  (RANEXA ) 500 MG 12 hr tablet Take 1 tablet (500 mg total) by mouth 2 (two) times daily. 02/11/23   Hobart Powell BRAVO, MD  Semaglutide , 2 MG/DOSE, 8 MG/3ML SOPN Inject 2 mg into the skin every Sunday. 02/14/24   [provider]  sildenafil (VIAGRA) 100 MG tablet Take 100 mg by mouth daily as needed for erectile dysfunction.    [provider]  SYSTANE BALANCE 0.6 % SOLN Place 1 drop into both eyes in the morning.    [provider]  Testosterone 1.62 % GEL Place 2 Pump onto the skin in the morning.    [provider]  Tiotropium Bromide  Monohydrate (SPIRIVA  RESPIMAT) 2.5 MCG/ACT AERS Inhale 2 puffs into the lungs daily. 01/14/24   Kassie Acquanetta Bradley, MD  triamcinolone ointment (KENALOG) 0.1 % Apply 1 Application topically 2 (two) times daily.    [provider]     Critical care time:   CRITICAL CARE Performed by: Anay Rathe D. Harris   Total critical care time: 42 minutes  Critical care time was exclusive of separately billable procedures and treating other patients.  Critical care was necessary to treat or prevent imminent or life-threatening deterioration.  Critical care was time spent personally by me on the following activities: development of treatment plan with patient and/or surrogate as well as nursing, discussions with consultants, evaluation of patient's response to treatment, examination of patient, obtaining history from patient or surrogate, ordering and performing treatments and interventions, ordering and review of laboratory studies, ordering and review of radiographic studies, pulse oximetry and re-evaluation of patient's condition.  Ilani Otterson D. Harris, NP-C [facility] Personal contact information can be found on Amion  If no contact or response made please call [phone] 05/12/2024, 7:15 AM

## 2024-05-12 NOTE — Progress Notes (Signed)
 Patient family at bedside requesting visitor Olam Candy not be allowed to visit this patient during this visitation. Informed security front desk as well as our receptionists of this request. This nurse encouraged family to come up with a password for visitation.

## 2024-05-12 NOTE — Progress Notes (Signed)
 Initial Nutrition Assessment  DOCUMENTATION CODES:   Obesity unspecified  INTERVENTION:  - Initiate tube feeding via OGT: Vital 1.5 at 55 ml/h (1320 ml per day) *Start at 46mL/hr and advance by 10mL Q6H Prosource TF20 60 ml BID Provides 2140 kcal, 129 gm protein, 1008 ml free water daily  - FWF per CCM.    NUTRITION DIAGNOSIS:   Inadequate oral intake related to inability to eat as evidenced by NPO status.  GOAL:   Patient will meet greater than or equal to 90% of their needs  MONITOR:   Vent status, Labs, Weight trends, TF tolerance  REASON FOR ASSESSMENT:   Consult Enteral/tube feeding initiation and management  ASSESSMENT:   73 year old male with PMH history significant for stage IV NSC lung cancer, COPD, HTN, HLD, type 2 diabetes, and BPH who presented for sudden onset shortness of breath.  6/25 Intubated in the ED; Admitted  Patient is currently intubated on ventilator support MV: 13.1 L/min Temp (24hrs), Avg:98.9 F (37.2 C), Min:98.1 F (36.7 C), Max:99.3 F (37.4 C)  Family at bedside. Daughter provided all nutrition history. UBW reported to be 225# and daughter feels his weight has been stable. Per EMR, no significant changes in weight within the past year.   Patient typically consumes 3 meals a day plus snacks at home and was eating normally up until the day of admission.   Received consult to initiate tube feeds. OGT xray verified in the stomach. Discussed with CCM, can initiate and begin advancing towards goal tube feeds today. Discussed advancement plans with RN.   Medications reviewed and include: Colace, Miralax  Levophed @ 3 mcg/min Propofol  - no rate documented  Labs reviewed:  Na 131 Creatinine 1.68 Magnesium  2.8 HA1C 7.8 (as of 5/17)   NUTRITION - FOCUSED PHYSICAL EXAM:  Flowsheet Row Most Recent Value  Orbital Region No depletion  Upper Arm Region No depletion  Thoracic and Lumbar Region No depletion  Buccal Region Unable to  assess  Temple Region No depletion  Clavicle Bone Region No depletion  Clavicle and Acromion Bone Region No depletion  Scapular Bone Region Unable to assess  Dorsal Hand No depletion  Patellar Region No depletion  Anterior Thigh Region No depletion  Posterior Calf Region No depletion  Edema (RD Assessment) None  Hair Reviewed  Eyes Unable to assess  Mouth Unable to assess  Skin Reviewed  Nails Reviewed    Diet Order:   Diet Order             Diet NPO time specified  Diet effective now                   EDUCATION NEEDS:  Education needs have been addressed  Skin:  Skin Assessment: Reviewed RN Assessment  Last BM:  PTA  Height:  Ht Readings from Last 1 Encounters:  05/12/24 5' 8 (1.727 m)   Weight:  Wt Readings from Last 1 Encounters:  05/12/24 107.4 kg   Ideal Body Weight:  70 kg  BMI:  Body mass index is 36 kg/m.  Estimated Nutritional Needs:  Kcal:  2000-2400 kcals ([employer]: 2170 kcals) Protein:  120-140 grams Fluid:  >/= 2L    Trude Ned RD, LDN Contact via Secure Chat.

## 2024-05-12 NOTE — Progress Notes (Signed)
 Patient was transported from the ED to ICU 39 without complications.

## 2024-05-12 NOTE — ED Notes (Signed)
 Faint pulse. Patient became pulses, compressions started.

## 2024-05-12 NOTE — ED Triage Notes (Signed)
 Stage 4 lung cancer, COPD. Came home from hospital today. Onset of SOB an hour ago. CPAP 125 solumedrol, deuneb, epi, magnesium  given by EMS. Sats not reading well but high 80s sat with CPAP on.

## 2024-05-12 NOTE — Procedures (Signed)
 Bronchoscopy Procedure Note  Jose Patrick  [id number]  [birth year]  Date:05/12/24  Time:10:53 AM   Provider Performing:Dhanush Jokerst S Meade   Procedure(s):  Flexible bronchoscopy with bronchial alveolar lavage [phone])  Indication(s) Pneumonia, respiratory failure  Consent Risks of the procedure as well as the alternatives and risks of each were explained to the patient and/or caregiver.  Consent for the procedure was obtained and is signed in the bedside chart  Anesthesia Propofol , fentnayl   Time Out Verified patient identification, verified procedure, site/side was marked, verified correct patient position, special equipment/implants available, medications/allergies/relevant history reviewed, required imaging and test results available.   Sterile Technique Usual hand hygiene, masks, gowns, and gloves were used   Procedure Description Bronchoscope advanced through endotracheal tube and into airway.  Airways were examined down to subsegmental level with findings noted below.   Following diagnostic evaluation, RML BAL(s) performed in 75 with normal saline and return of 30 fluid  Findings: erythematous airways, right tracheobronchial tree examined with extrinsic compression of the RLL.    Complications/Tolerance None; patient tolerated the procedure well. Chest X-ray is not needed post procedure.   EBL Minimal   Specimen(s) Bal sent for cell count, gram stain and culture

## 2024-05-12 NOTE — ED Notes (Signed)
 Pulse Returned.

## 2024-05-12 NOTE — Progress Notes (Signed)
 Pharmacy Antibiotic Note  Evaristo Tsuda is a 73 y.o. male admitted on 05/12/2024 with pneumonia.  Pharmacy has been consulted for vanc/cefepime  dosing.  Plan: Vanc 1750mg  x 1 ordered then start 1g IV q24 - goal AUC 400-550 Cefepime  2g IV q12 Follow up MRSA PCR result for possible vanc d/c  Height: 5' 8 (172.7 cm) IBW/kg (Calculated) : 68.4  Temp (24hrs), Avg:99.3 F (37.4 C), Min:99.3 F (37.4 C), Max:99.3 F (37.4 C)  Recent Labs  Lab 05/12/24 0626 05/12/24 0628  WBC 18.3*  --   CREATININE 1.68*  --   LATICACIDVEN  --  4.8*    Estimated Creatinine Clearance: 47.7 mL/min (A) (by C-G formula based on SCr of 1.68 mg/dL (H)).    Allergies  Allergen Reactions   Chlorzoxazone Swelling and Other (See Comments)    Psychosis  (Parafon Forte) ABSOLUTELY CANNOT HAVE THIS!!   Tramadol Other (See Comments)    Muscles Tightness      Thank you for allowing pharmacy to be a part of this patient's care.  Britta Eva Na 05/12/2024 8:16 AM

## 2024-05-13 ENCOUNTER — Inpatient Hospital Stay: Admitting: Internal Medicine

## 2024-05-13 ENCOUNTER — Inpatient Hospital Stay

## 2024-05-13 DIAGNOSIS — J9622 Acute and chronic respiratory failure with hypercapnia: Secondary | ICD-10-CM | POA: Diagnosis not present

## 2024-05-13 DIAGNOSIS — J9621 Acute and chronic respiratory failure with hypoxia: Secondary | ICD-10-CM | POA: Diagnosis not present

## 2024-05-13 LAB — BASIC METABOLIC PANEL WITH GFR
Anion gap: 12 (ref 5–15)
BUN: 38 mg/dL — ABNORMAL HIGH (ref 8–23)
CO2: 25 mmol/L (ref 22–32)
Calcium: 8.7 mg/dL — ABNORMAL LOW (ref 8.9–10.3)
Chloride: 98 mmol/L (ref 98–111)
Creatinine, Ser: 1.38 mg/dL — ABNORMAL HIGH (ref 0.61–1.24)
GFR, Estimated: 54 mL/min — ABNORMAL LOW (ref >60)
Glucose, Bld: 209 mg/dL — ABNORMAL HIGH (ref 70–99)
Potassium: 4.4 mmol/L (ref 3.5–5.1)
Sodium: 135 mmol/L (ref 135–145)

## 2024-05-13 LAB — CBC
HCT: 48.8 % (ref 39.0–52.0)
Hemoglobin: 15.3 g/dL (ref 13.0–17.0)
MCH: 26.1 pg (ref 26.0–34.0)
MCHC: 31.4 g/dL (ref 30.0–36.0)
MCV: 83.3 fL (ref 80.0–100.0)
Platelets: 230 10*3/uL (ref 150–400)
RBC: 5.86 MIL/uL — ABNORMAL HIGH (ref 4.22–5.81)
RDW: 15.9 % — ABNORMAL HIGH (ref 11.5–15.5)
WBC: 17 10*3/uL — ABNORMAL HIGH (ref 4.0–10.5)
nRBC: 0 % (ref 0.0–0.2)

## 2024-05-13 LAB — BLOOD GAS, ARTERIAL
Acid-Base Excess: 3.1 mmol/L — ABNORMAL HIGH (ref 0.0–2.0)
Bicarbonate: 28.5 mmol/L — ABNORMAL HIGH (ref 20.0–28.0)
Drawn by: 31394
FIO2: 100 %
MECHVT: 540 mL
O2 Saturation: 95.9 %
PEEP: 14 cmH2O
Patient temperature: 37
RATE: 28 {breaths}/min
pCO2 arterial: 45 mmHg (ref 32–48)
pH, Arterial: 7.41 (ref 7.35–7.45)
pO2, Arterial: 67 mmHg — ABNORMAL LOW (ref 83–108)

## 2024-05-13 LAB — PATHOLOGIST SMEAR REVIEW

## 2024-05-13 LAB — PHOSPHORUS: Phosphorus: 2.9 mg/dL (ref 2.5–4.6)

## 2024-05-13 LAB — GLUCOSE, CAPILLARY
Glucose-Capillary: 189 mg/dL — ABNORMAL HIGH (ref 70–99)
Glucose-Capillary: 236 mg/dL — ABNORMAL HIGH (ref 70–99)
Glucose-Capillary: 277 mg/dL — ABNORMAL HIGH (ref 70–99)

## 2024-05-13 LAB — MAGNESIUM: Magnesium: 2.7 mg/dL — ABNORMAL HIGH (ref 1.7–2.4)

## 2024-05-13 MED ORDER — GLYCOPYRROLATE 1 MG PO TABS: 1.0000 mg | ORAL_TABLET | ORAL | Status: DC | PRN

## 2024-05-13 MED ORDER — MORPHINE 100MG IN NS 100ML (1MG/ML) PREMIX INFUSION
0.0000 mg/h | INTRAVENOUS | Status: DC
  Administered 2024-05-13: 5 mg/h via INTRAVENOUS
  Filled 2024-05-13: qty 100

## 2024-05-13 MED ORDER — SODIUM CHLORIDE 0.9% FLUSH: 3.0000 mL | Freq: Two times a day (BID) | INTRAVENOUS | Status: DC

## 2024-05-13 MED ORDER — MORPHINE BOLUS VIA INFUSION: 5.0000 mg | INTRAVENOUS | Status: DC | PRN

## 2024-05-13 MED ORDER — INSULIN GLARGINE-YFGN 100 UNIT/ML ~~LOC~~ SOLN: 25.0000 [IU] | Freq: Every day | SUBCUTANEOUS | Status: DC

## 2024-05-13 MED ORDER — GLYCOPYRROLATE 0.2 MG/ML IJ SOLN
0.2000 mg | INTRAMUSCULAR | Status: DC | PRN
  Administered 2024-05-13: 0.2 mg via INTRAVENOUS
  Filled 2024-05-13: qty 1

## 2024-05-13 MED ORDER — ACETAMINOPHEN 650 MG RE SUPP
650.0000 mg | Freq: Four times a day (QID) | RECTAL | Status: DC | PRN
Start: 2024-05-13 — End: 2024-05-14

## 2024-05-13 MED ORDER — SODIUM CHLORIDE 0.9 % IV SOLN: INTRAVENOUS | Status: DC | PRN

## 2024-05-13 MED ORDER — SODIUM CHLORIDE 0.9% FLUSH: 3.0000 mL | INTRAVENOUS | Status: DC | PRN

## 2024-05-13 MED ORDER — MIDAZOLAM HCL 2 MG/2ML IJ SOLN
INTRAMUSCULAR | Status: AC
  Filled 2024-05-13: qty 2

## 2024-05-13 MED ORDER — GLYCOPYRROLATE 0.2 MG/ML IJ SOLN: 0.2000 mg | INTRAMUSCULAR | Status: DC | PRN

## 2024-05-13 MED ORDER — ACETAMINOPHEN 325 MG PO TABS: 650.0000 mg | ORAL_TABLET | Freq: Four times a day (QID) | ORAL | Status: DC | PRN

## 2024-05-13 MED ORDER — FUROSEMIDE 10 MG/ML IJ SOLN
40.0000 mg | Freq: Once | INTRAMUSCULAR | Status: AC
  Administered 2024-05-13: 40 mg via INTRAVENOUS
  Filled 2024-05-13: qty 4

## 2024-05-13 MED ORDER — MIDAZOLAM HCL 2 MG/2ML IJ SOLN: 2.0000 mg | INTRAMUSCULAR | Status: DC | PRN

## 2024-05-13 MED ORDER — POLYVINYL ALCOHOL 1.4 % OP SOLN: 1.0000 [drp] | Freq: Four times a day (QID) | OPHTHALMIC | Status: DC | PRN

## 2024-05-14 LAB — ACID FAST SMEAR (AFB, MYCOBACTERIA): Acid Fast Smear: NEGATIVE

## 2024-05-15 LAB — CULTURE, RESPIRATORY W GRAM STAIN: Culture: NORMAL

## 2024-05-17 LAB — CULTURE, BLOOD (ROUTINE X 2)
Culture: NO GROWTH
Culture: NO GROWTH

## 2024-05-18 NOTE — Progress Notes (Signed)
 PCCM progress note  Patient has had progressive decline throughout the day including worsening ventilation and oxygenation with persistent hypoxia despite maximal ventilator settings.  He is also becoming more hypotensive.  With these acute changes goals of care was discussed with family and decision was made to transition to a comfort approach once all family has arrived.  Jose Nazzaro D. Harris, NP-C [facility] Personal contact information can be found on Amion  If no contact or response made please call [phone] 05/17/2024, 1:47 PM

## 2024-05-18 NOTE — Plan of Care (Signed)
  Problem: Coping: Goal: Ability to adjust to condition or change in health will improve Outcome: Not Progressing   Problem: Health Behavior/Discharge Planning: Goal: Ability to manage health-related needs will improve Outcome: Not Progressing   Problem: Metabolic: Goal: Ability to maintain appropriate glucose levels will improve Outcome: Not Progressing   Problem: Skin Integrity: Goal: Risk for impaired skin integrity will decrease Outcome: Not Progressing   Problem: Tissue Perfusion: Goal: Adequacy of tissue perfusion will improve Outcome: Not Progressing   Problem: Clinical Measurements: Goal: Will remain free from infection Outcome: Not Progressing Goal: Diagnostic test results will improve Outcome: Not Progressing

## 2024-05-18 NOTE — IPAL (Signed)
  Interdisciplinary Goals of Care Family Meeting   Date carried out: 04/29/2024  Location of the meeting: Bedside  Member's involved: Physician, Chaplain, and Family Member or next of kin  Durable Power of Attorney or acting medical decision maker: wife Mrs. Sebastian.    Discussion: We discussed goals of care for [facility] .  Progressive clinical decline - unable to keep saturations over 84% despite maximum ventilator settings. Patient appears uncomfortable on ventilator. Met with multiple family members and spiritual care at bedside. Wife acknowledges that he has previously discussed wishes to not desire prolonged support. Agreeable to initiation of morphine gtt for comfort with palliative extubation when patient's family is ready later today.   Code status:   Code Status: Do not attempt resuscitation (DNR) - Comfort care   Disposition: In-patient comfort care  Time spent for the meeting: 15 minutes    Verdon GORMAN Gore, MD  05/06/2024, 4:07 PM

## 2024-05-18 NOTE — Death Summary Note (Deleted)
 DEATH SUMMARY   Patient Details  Name: Jose Patrick MRN: [id number] DOB: 28-Jan-1951  Admission/Discharge Information   Admit Date:  05/21/24  Date of Death: Date of Death: May 22, 2024  Time of Death: Time of Death: 05/26/10  Length of Stay: 1  Referring Physician: [facility]   Reason(s) for Hospitalization  Respiratory failure  Diagnoses  Preliminary cause of death: adenocarcinoma lung Secondary Diagnoses (including complications and co-morbidities):  Principal Problem:   Respiratory failure (HCC) COPD HTN   Brief Hospital Course (including significant findings, care, treatment, and services provided and events leading to death)  Jose Patrick was a 73 y.o. year old male who with a past medical history significant for stage IV NSC lung cancer, COPD, HTN, HLD, type 2 diabetes, and BPH presented to the ED at [facility] for sudden onset shortness of breath.  On EMS arrival patient was satting 80% CPAP.  Acute respiratory distress on arrival patient was emergently intubated in the ED. During intubation patient briefly became pulseless and required ACLS protocol for approximately 2 mins. Labor significant for Na 131, Cl 92, glucose 422, creatinine 1.68, BUN 35, AST 50, ALT 55, HS troponin 57, lactic 4.8, WBC 18.3. CXR with extensive right greater than left airspace disease with increased density in the LUL. Progressive clinical decline despite antibiotics and venatiltory support. Discussed plan of care with family he was made DNR and ultimately comfort care and passed away peacefully with family at bedside.   Pertinent Labs and Studies  Significant Diagnostic Studies ECHOCARDIOGRAM LIMITED Result Date: 2024/05/21    ECHOCARDIOGRAM LIMITED REPORT   Patient Name:   Jose Patrick Date of Exam: 2024-05-21 Medical Rec #:  [id number]          Height:       68.0 in Accession #:    [id number]         Weight:       236.8 lb Date of Birth:  1951-06-25          BSA:           2.196 m Patient Age:    73 years           BP:           116/59 mmHg Patient Gender: M                  HR:           88 bpm. Exam Location:  Inpatient Procedure: 2D Echo, Limited Echo, Color Doppler, Cardiac Doppler and            Intracardiac Opacification Agent (Both Spectral and Color Flow            Doppler were utilized during procedure). Indications:    Cardiac arrest  History:        Patient has prior history of Echocardiogram examinations, most                 recent 05/04/2024. Risk Factors:Hypertension, Diabetes and                 Dyslipidemia.  Sonographer:    Therisa Crouch Referring Phys: [phone] WHITNEY D HARRIS IMPRESSIONS  1. Technically difficult echo with limited visualization.  2. Left ventricular ejection fraction, by estimation, is 60 to 65%. The left ventricle has normal function. The left ventricle has no regional wall motion abnormalities.  3. Right ventricular systolic function is normal. The right ventricular size is normal.  4. A small  pericardial effusion is present.  5. The mitral valve is grossly normal. No evidence of mitral valve regurgitation. No evidence of mitral stenosis.  6. The aortic valve was not well visualized. Aortic valve regurgitation is not visualized. No aortic stenosis is present.  7. The inferior vena cava is normal in size with greater than 50% respiratory variability, suggesting right atrial pressure of 3 mmHg. FINDINGS  Left Ventricle: Left ventricular ejection fraction, by estimation, is 60 to 65%. The left ventricle has normal function. The left ventricle has no regional wall motion abnormalities. The left ventricular internal cavity size was normal in size. There is  no left ventricular hypertrophy. Right Ventricle: The right ventricular size is normal. No increase in right ventricular wall thickness. Right ventricular systolic function is normal. Left Atrium: Left atrial size was normal in size. Right Atrium: Right atrial size was normal in size. Pericardium: A  small pericardial effusion is present. Mitral Valve: The mitral valve is grossly normal. No evidence of mitral valve stenosis. Tricuspid Valve: The tricuspid valve is not well visualized. Tricuspid valve regurgitation is not demonstrated. No evidence of tricuspid stenosis. Aortic Valve: The aortic valve was not well visualized. Aortic valve regurgitation is not visualized. No aortic stenosis is present. Pulmonic Valve: The pulmonic valve was not well visualized. Pulmonic valve regurgitation is not visualized. No evidence of pulmonic stenosis. Aorta: The aortic root is normal in size and structure. Venous: The inferior vena cava is normal in size with greater than 50% respiratory variability, suggesting right atrial pressure of 3 mmHg. IAS/Shunts: No atrial level shunt detected by color flow Doppler. Ria Commander Electronically signed by Ria Commander Signature Date/Time: 05/12/2024/5:28:59 PM    Final    DG Abdomen 1 View Result Date: 05/12/2024 CLINICAL DATA:  Evaluate enteric tube placement. EXAM: ABDOMEN - 1 VIEW COMPARISON:  05/03/2024 FINDINGS: Enteric tube tip and side port are in the gastric fundus. Again seen are bilateral pulmonary opacities, right greater than left. Paucity of bowel gas noted within the upper abdomen. Postsurgical changes in the lumbar spine. IMPRESSION: Enteric tube tip and side port are in the gastric fundus. Electronically Signed   By: Waddell Calk M.D.   On: 05/12/2024 06:43   DG Chest Portable 1 View Result Date: 05/12/2024 CLINICAL DATA:  Bilateral airspace disease, now ventilator dependent, check for ETT positioning. EXAM: PORTABLE CHEST 1 VIEW COMPARISON:  Last portable chest was 05/03/2024 FINDINGS: 6:17 a.m. ETT interval insertion with tip 3.7 cm from the carina. There is an overlying electrical pad, overlying telemetry leads. Extensive bilateral airspace disease continues to be seen, and again is most dense and most confluent on the right, with increasing density  of patchy airspace disease today in the left upper lobe. There are at least small pleural effusions, greater on the right where there is obscuration of the hemidiaphragm. Heart is enlarged. Central vessels are difficult to see but may be slightly prominent on the left. The mediastinum is stable. There is aortic atherosclerosis. No new skeletal findings. IMPRESSION: 1. ETT tip 3.7 cm from the carina. 2. Extensive right greater than left airspace disease, with increasing density of patchy airspace disease today in the left upper lobe. The bilateral opacities are otherwise not notably changed. 3. Right greater than left pleural effusions. 4. Cardiomegaly. 5. Aortic atherosclerosis. Electronically Signed   By: Francis Quam M.D.   On: 05/12/2024 06:40   CT Angio Chest Pulmonary Embolism (PE) W or WO Contrast Result Date: 05/04/2024 CLINICAL DATA:  Stage IV non-small  cell lung cancer, COPD, shortness of breath, chest pressure. * Tracking Code: BO * EXAM: CT ANGIOGRAPHY CHEST WITH CONTRAST TECHNIQUE: Multidetector CT imaging of the chest was performed using the standard protocol during bolus administration of intravenous contrast. Multiplanar CT image reconstructions and MIPs were obtained to evaluate the vascular anatomy. RADIATION DOSE REDUCTION: This exam was performed according to the departmental dose-optimization program which includes automated exposure control, adjustment of the mA and/or kV according to patient size and/or use of iterative reconstruction technique. CONTRAST:  75mL OMNIPAQUE  IOHEXOL  350 MG/ML SOLN COMPARISON:  04/01/2024 FINDINGS: Despite efforts by the technologist and patient, motion artifact is present on today's exam and could not be eliminated. This reduces exam sensitivity and specificity. Cardiovascular: No filling defect is identified in the pulmonary arterial tree to suggest pulmonary embolus. Coronary, aortic arch, and branch vessel atherosclerotic vascular disease. Borderline  cardiomegaly. Mediastinum/Nodes: AP window lymph node 1.0 cm in short axis on image 32 series 4, stable. Stable mild right paratracheal and stable right hilar adenopathy. Mildly dilated distal esophagus with air-fluid level. Lungs/Pleura: Emphysema. Continued consolidation of the entire right lower lobe with worsening consolidation in the right middle lobe and right upper lobe. Extensive small nodules are present diffusely throughout both lungs, substantially increased in size and number from 04/19/2024, possibilities include heavy burden of lung metastatic disease versus fungal or atypical infection. No cavitation identified. Upper Abdomen: Contracted gallbladder. Atheromatous plaque at the origin of the celiac trunk with some narrowing of the celiac trunk which may be due to median arcuate ligament. Mild right retrocrural adenopathy. Musculoskeletal: Thoracic spondylosis. Lower cervical plate and screw fixator. Review of the MIP images confirms the above findings. IMPRESSION: 1. No filling defect is identified in the pulmonary arterial tree to suggest pulmonary embolus. Adversely affected negative predictive value due to motion artifact. 2. Continued consolidation of the entire right lower lobe with worsening and substantial consolidation in the right middle lobe and right upper lobe. 3. Extensive small nodules are present diffusely throughout both lungs, substantially increased in size and number from 04/19/2024, possibilities include heavy burden of lung metastatic disease versus fungal or atypical infection. 4. Stable mild mediastinal and right hilar adenopathy. 5. Mildly dilated distal esophagus with air-fluid level, suggesting reflux. 6. Aortic Atherosclerosis (ICD10-I70.0) and Emphysema (ICD10-J43.9). Electronically Signed   By: Ryan Salvage M.D.   On: 05/04/2024 16:45   US  CHEST (PLEURAL EFFUSION) Result Date: 05/04/2024 CLINICAL DATA:  Right-sided pleural effusion EXAM: CHEST ULTRASOUND  COMPARISON:  None Available. FINDINGS: Minimal fluid in present upon examination with ultrasound. No procedure performed. IMPRESSION: Minimal pleural fluid. Electronically Signed   By: Cordella Banner   On: 05/04/2024 14:12   ECHOCARDIOGRAM COMPLETE Result Date: 05/04/2024    ECHOCARDIOGRAM REPORT   Patient Name:   Jose Patrick Date of Exam: 05/04/2024 Medical Rec #:  [id number]          Height:       68.0 in Accession #:    [id number]         Weight:       197.3 lb Date of Birth:  [birth year]          BSA:          2.032 m Patient Age:    73 years           BP:           134/85 mmHg Patient Gender: M  HR:           81 bpm. Exam Location:  Inpatient Procedure: 2D Echo, Cardiac Doppler and Color Doppler (Both Spectral and Color            Flow Doppler were utilized during procedure). Indications:    Congestive Heart Failure I50.9  History:        Patient has prior history of Echocardiogram examinations, most                 recent 08/15/2023. Risk Factors:Hypertension and Diabetes.  Sonographer:    Jayson Gaskins Referring Phys: [id number] JONATHAN SEGARS IMPRESSIONS  1. Technically difficult study with poor visualization of endocardium.  2. Left ventricular ejection fraction, by estimation, is 55 to 60%. The left ventricle has normal function. The left ventricle has no regional wall motion abnormalities. Left ventricular diastolic parameters are indeterminate.  3. Right ventricular systolic function is normal. The right ventricular size is normal.  4. The mitral valve is normal in structure. No evidence of mitral valve regurgitation. No evidence of mitral stenosis.  5. The aortic valve has an indeterminant number of cusps. Aortic valve regurgitation is not visualized. No aortic stenosis is present.  6. The inferior vena cava is normal in size with greater than 50% respiratory variability, suggesting right atrial pressure of 3 mmHg. FINDINGS  Left Ventricle: Left ventricular ejection fraction, by  estimation, is 55 to 60%. The left ventricle has normal function. The left ventricle has no regional wall motion abnormalities. The left ventricular internal cavity size was normal in size. There is  no left ventricular hypertrophy. Left ventricular diastolic parameters are indeterminate. Right Ventricle: The right ventricular size is normal. No increase in right ventricular wall thickness. Right ventricular systolic function is normal. Left Atrium: Left atrial size was normal in size. Right Atrium: Right atrial size was normal in size. Pericardium: There is no evidence of pericardial effusion. Mitral Valve: The mitral valve is normal in structure. No evidence of mitral valve regurgitation. No evidence of mitral valve stenosis. Tricuspid Valve: The tricuspid valve is normal in structure. Tricuspid valve regurgitation is not demonstrated. No evidence of tricuspid stenosis. Aortic Valve: The aortic valve has an indeterminant number of cusps. Aortic valve regurgitation is not visualized. No aortic stenosis is present. Aortic valve mean gradient measures 3.0 mmHg. Aortic valve peak gradient measures 5.4 mmHg. Aortic valve area, by VTI measures 2.55 cm. Pulmonic Valve: The pulmonic valve was normal in structure. Pulmonic valve regurgitation is not visualized. No evidence of pulmonic stenosis. Aorta: The aortic root is normal in size and structure. Venous: The inferior vena cava is normal in size with greater than 50% respiratory variability, suggesting right atrial pressure of 3 mmHg. IAS/Shunts: No atrial level shunt detected by color flow Doppler.  LEFT VENTRICLE PLAX 2D LVIDd:         3.50 cm   Diastology LVIDs:         1.80 cm   LV e' medial:    6.85 cm/s LV PW:         0.90 cm   LV E/e' medial:  10.6 LV IVS:        0.60 cm   LV e' lateral:   9.46 cm/s LVOT diam:     1.90 cm   LV E/e' lateral: 7.7 LV SV:         57 LV SV Index:   28 LVOT Area:     2.84 cm  RIGHT VENTRICLE RV S prime:  12.80 cm/s TAPSE (M-mode):  2.0 cm LEFT ATRIUM             Index        RIGHT ATRIUM           Index LA Vol (A2C):   32.2 ml 15.85 ml/m  RA Area:     10.60 cm LA Vol (A4C):   47.3 ml 23.28 ml/m  RA Volume:   20.10 ml  9.89 ml/m LA Biplane Vol: 40.6 ml 19.98 ml/m  AORTIC VALVE AV Area (Vmax):    2.49 cm AV Area (Vmean):   2.22 cm AV Area (VTI):     2.55 cm AV Vmax:           116.00 cm/s AV Vmean:          85.700 cm/s AV VTI:            0.222 m AV Peak Grad:      5.4 mmHg AV Mean Grad:      3.0 mmHg LVOT Vmax:         102.00 cm/s LVOT Vmean:        67.000 cm/s LVOT VTI:          0.200 m LVOT/AV VTI ratio: 0.90  AORTA Ao Root diam: 3.10 cm MITRAL VALVE MV Area (PHT): 2.08 cm    SHUNTS MV E velocity: 72.80 cm/s  Systemic VTI:  0.20 m MV A velocity: 94.30 cm/s  Systemic Diam: 1.90 cm MV E/A ratio:  0.77 Aditya Sabharwal Electronically signed by Ria Commander Signature Date/Time: 05/04/2024/2:02:29 PM    Final    DG Chest Portable 1 View Result Date: 05/03/2024 CLINICAL DATA:  Stage IV lung cancer, diabetes, productive cough after bronchoscopy yesterday EXAM: PORTABLE CHEST 1 VIEW COMPARISON:  04/25/2024 FINDINGS: Single frontal view of the chest demonstrates a stable cardiac silhouette. There is widespread bilateral airspace disease, right greater than left, increased since prior study. Underlying effusions are suspected, right greater than left. No pneumothorax. No acute bony abnormalities. IMPRESSION: 1. Progressive widespread bilateral airspace disease, right greater than left, which could reflect worsening infection, edema, or hemorrhage. 2. Bilateral pleural effusions, right greater than left. Electronically Signed   By: Ozell Daring M.D.   On: 05/03/2024 17:33   MR BRAIN WO CONTRAST Result Date: 04/28/2024 CLINICAL DATA:  Non-small cell lung carcinoma staging EXAM: MRI HEAD WITHOUT CONTRAST TECHNIQUE: Multiplanar, multiecho pulse sequences of the brain and surrounding structures were obtained without intravenous contrast.  COMPARISON:  06/30/2023 FINDINGS: Brain: No acute infarct, mass effect or extra-axial collection. No acute or chronic hemorrhage. There is multifocal hyperintense T2-weighted signal within the white matter. Generalized volume loss. The midline structures are normal. Lack of intravenous contrast agent administration limits sensitivity for the detection of metastases. Vascular: Normal flow voids. Skull and upper cervical spine: Normal calvarium and skull base. Visualized upper cervical spine and soft tissues are normal. Sinuses/Orbits:No paranasal sinus fluid levels or advanced mucosal thickening. No mastoid or middle ear effusion. Normal orbits. IMPRESSION: 1. Lack of intravenous contrast agent administration limits sensitivity for the detection of metastases. 2. No acute intracranial abnormality. 3. Findings of chronic small vessel ischemia and volume loss. Electronically Signed   By: Franky Stanford M.D.   On: 04/28/2024 18:19   DG CHEST PORT 1 VIEW Result Date: 04/25/2024 CLINICAL DATA:  Increased dyspnea EXAM: PORTABLE CHEST 1 VIEW COMPARISON:  04/22/2024 FINDINGS: Stable cardiomediastinal silhouette. Diffuse bilateral reticulonodular opacities and interstitial coarsening prior. There is similar to increased airspace  opacities in the right mid and lower lung. Layering right greater than left pleural effusions. No pneumothorax. No displaced rib fracture. IMPRESSION: 1. Similar to increased airspace and interstitial opacities greatest in the right lower lobe. 2. Layering right greater than left pleural effusions. Electronically Signed   By: Norman Gatlin M.D.   On: 04/25/2024 19:37   DG Chest Port 1 View Result Date: 04/22/2024 CLINICAL DATA:  Status post bronchoscopy with biopsy. EXAM: PORTABLE CHEST 1 VIEW COMPARISON:  April 20, 2024. FINDINGS: Stable cardiomediastinal silhouette. Stable bilateral reticulonodular opacities are noted throughout both lungs concerning for pneumonia or edema with associated pleural  effusions, right greater than left. No pneumothorax. Bony thorax is unremarkable. IMPRESSION: Bilateral lung opacities and pleural effusions as noted above. No pneumothorax is noted. Electronically Signed   By: Lynwood Landy Raddle M.D.   On: 04/22/2024 13:47   DG C-ARM BRONCHOSCOPY Result Date: 04/22/2024 C-ARM BRONCHOSCOPY: Fluoroscopy was utilized by the requesting physician.  No radiographic interpretation.   DG CHEST PORT 1 VIEW Result Date: 04/20/2024 CLINICAL DATA:  Status post thoracentesis. EXAM: PORTABLE CHEST 1 VIEW COMPARISON:  Radiograph in CT yesterday FINDINGS: No pneumothorax post thoracentesis. Slight decreased right basilar opacity, persistent airspace disease and likely pleural fluid persists. Underlying emphysema and bronchial thickening. Pulmonary nodules on CT are faintly visualized. Stable heart size and mediastinal contours. IMPRESSION: 1. No pneumothorax post thoracentesis. 2. Slight decreased right basilar opacity. Persistent right sided airspace disease and likely pleural fluid. Electronically Signed   By: Andrea Gasman M.D.   On: 04/20/2024 15:10   CT CHEST WO CONTRAST Result Date: 04/19/2024 CLINICAL DATA:  Multifocal pneumonia. Worsening cough and shortness of breath over the past few days. Decreased oxygen saturation. EXAM: CT CHEST WITHOUT CONTRAST TECHNIQUE: Multidetector CT imaging of the chest was performed following the standard protocol without IV contrast. RADIATION DOSE REDUCTION: This exam was performed according to the departmental dose-optimization program which includes automated exposure control, adjustment of the mA and/or kV according to patient size and/or use of iterative reconstruction technique. COMPARISON:  Chest radiograph 04/19/2024.  CT chest 04/01/2024 FINDINGS: Cardiovascular: Normal heart size. Minimal pericardial effusion. Normal caliber thoracic aorta. Calcification of the aorta and coronary arteries. Mediastinum/Nodes: Esophagus is decompressed. Thyroid  gland is unremarkable. Mediastinal lymphadenopathy with largest pretracheal nodes measuring up to about 1.4 cm diameter. No change since prior study. This is nonspecific but likely to be reactive. Lungs/Pleura: Bilateral pleural effusions, greater on the right. Dense consolidation in the right lower lung with patchy airspace disease throughout the remainder the right lung. Numerous small nodules demonstrated throughout the left lung in the area of portion of the right lung which are new since prior study. Short interval time frame suggest that these are likely infectious or inflammatory. Changes most likely represent progressing pneumonia. This could also represent atypical pneumonia such as TB or fungal infection. Septic emboli would also be a possibility. Follow-up to resolution is recommended. Underlying emphysematous changes in the lungs. Upper Abdomen: No acute abnormalities. Musculoskeletal: Degenerative changes in the spine. Postoperative change in the cervical spine. No acute bony abnormalities. IMPRESSION: 1. Bilateral pleural effusions, greater on the right. 2. Dense consolidation in the right lower lung with airspace disease throughout the remaining right lung. This is progressing since the prior study. 3. Developing diffuse fine nodular infiltration throughout the left lung in the aerated portion of the right lung. This is likely infectious or inflammatory. 4. Underlying emphysematous changes in the lungs. Electronically Signed   By: Elsie  Mannie M.D.   On: 04/19/2024 18:25   DG Chest 2 View Result Date: 04/19/2024 CLINICAL DATA:  Cough, shortness of breath EXAM: CHEST - 2 VIEW COMPARISON:  04/01/2024 FINDINGS: Diffuse bilateral airspace disease again noted, right greater than left, similar to prior study. Possible small bilateral effusions. Heart and mediastinal contours within normal limits. Aortic atherosclerosis. IMPRESSION: Continued diffuse bilateral airspace disease, right greater than left  concerning for pneumonia. Findings similar to prior study. Electronically Signed   By: Franky Crease M.D.   On: 04/19/2024 12:48    Microbiology Recent Results (from the past 240 hours)  Blood culture (routine x 2)     Status: None (Preliminary result)   Collection Time: 05/12/24  6:37 AM   Specimen: BLOOD  Result Value Ref Range Status   Specimen Description   Final    BLOOD BLOOD RIGHT HAND Performed at [facility], [address], [place], [place] [address]    Special Requests   Final    BOTTLES DRAWN AEROBIC AND ANAEROBIC Blood Culture results may not be optimal due to an inadequate volume of blood received in culture bottles Performed at [facility], [address], [place], [place] [address]    Culture   Final    NO GROWTH 1 DAY Performed at [facility], [address] [place], [place] [address]    Report Status PENDING  Incomplete  MRSA Next Gen by PCR, Nasal     Status: None   Collection Time: 05/12/24  7:46 AM   Specimen: Anterior Nasal Swab  Result Value Ref Range Status   MRSA by PCR Next Gen NOT DETECTED NOT DETECTED Final    Comment: (NOTE) The GeneXpert MRSA Assay (FDA approved for NASAL specimens only), is one component of a comprehensive MRSA colonization surveillance program. It is not intended to diagnose MRSA infection nor to guide or monitor treatment for MRSA infections. Test performance is not FDA approved in patients less than 51 years old. Performed at [facility], [address] [place], [place] [address]   Resp panel by RT-PCR (RSV, Flu A&B, Covid) Anterior Nasal Swab     Status: None   Collection Time: 05/12/24  8:19 AM   Specimen: Anterior Nasal Swab  Result Value Ref Range Status   SARS Coronavirus 2 by RT PCR NEGATIVE NEGATIVE Final    Comment: (NOTE) SARS-CoV-2 target nucleic acids are NOT DETECTED.  The SARS-CoV-2 RNA is generally detectable in upper respiratory specimens  during the acute phase of infection. The lowest concentration of SARS-CoV-2 viral copies this assay can detect is 138 copies/mL. A negative result does not preclude SARS-Cov-2 infection and should not be used as the sole basis for treatment or other patient management decisions. A negative result may occur with  improper specimen collection/handling, submission of specimen other than nasopharyngeal swab, presence of viral mutation(s) within the areas targeted by this assay, and inadequate number of viral copies(<138 copies/mL). A negative result must be combined with clinical observations, patient history, and epidemiological information. The expected result is Negative.  Fact Sheet for Patients:  BloggerCourse.com  Fact Sheet for Healthcare Providers:  SeriousBroker.it  This test is no t yet approved or cleared by the [place]  FDA and  has been authorized for detection and/or diagnosis of SARS-CoV-2 by FDA under an Emergency Use Authorization (EUA). This EUA will remain  in effect (meaning this test can be used) for the duration of the COVID-19 declaration under Section  564(b)(1) of the Act, 21 U.S.C.section 360bbb-3(b)(1), unless the authorization is terminated  or revoked sooner.       Influenza A by PCR NEGATIVE NEGATIVE Final   Influenza B by PCR NEGATIVE NEGATIVE Final    Comment: (NOTE) The Xpert Xpress SARS-CoV-2/FLU/RSV plus assay is intended as an aid in the diagnosis of influenza from Nasopharyngeal swab specimens and should not be used as a sole basis for treatment. Nasal washings and aspirates are unacceptable for Xpert Xpress SARS-CoV-2/FLU/RSV testing.  Fact Sheet for Patients: BloggerCourse.com  Fact Sheet for Healthcare Providers: SeriousBroker.it  This test is not yet approved or cleared by the [place]  FDA and has been authorized for detection  and/or diagnosis of SARS-CoV-2 by FDA under an Emergency Use Authorization (EUA). This EUA will remain in effect (meaning this test can be used) for the duration of the COVID-19 declaration under Section 564(b)(1) of the Act, 21 U.S.C. section 360bbb-3(b)(1), unless the authorization is terminated or revoked.     Resp Syncytial Virus by PCR NEGATIVE NEGATIVE Final    Comment: (NOTE) Fact Sheet for Patients: BloggerCourse.com  Fact Sheet for Healthcare Providers: SeriousBroker.it  This test is not yet approved or cleared by the [place]  FDA and has been authorized for detection and/or diagnosis of SARS-CoV-2 by FDA under an Emergency Use Authorization (EUA). This EUA will remain in effect (meaning this test can be used) for the duration of the COVID-19 declaration under Section 564(b)(1) of the Act, 21 U.S.C. section 360bbb-3(b)(1), unless the authorization is terminated or revoked.  Performed at [facility], [address], [place], [place] [address]   Blood culture (routine x 2)     Status: None (Preliminary result)   Collection Time: 05/12/24  9:06 AM   Specimen: BLOOD  Result Value Ref Range Status   Specimen Description   Final    BLOOD BLOOD RIGHT HAND AEROBIC BOTTLE ONLY ANAEROBIC BOTTLE ONLY Performed at [facility], [address] [place], [place] [address]    Special Requests   Final    NONE Performed at [facility], [address] [place], [place] [address]    Culture   Final    NO GROWTH < 24 HOURS Performed at [facility], [address] [place], [place] [address]    Report Status PENDING  Incomplete  Culture, Respiratory w Gram Stain     Status: None (Preliminary result)   Collection Time: 05/12/24 10:45 AM   Specimen: Bronchoalveolar Lavage; Respiratory  Result Value Ref Range Status   Specimen Description   Final    BRONCHIAL  ALVEOLAR LAVAGE Performed at [facility], [address], [place], [place] [address]    Special Requests   Final    NONE Performed at [facility], [address] [place], [place] [address]    Gram Stain   Final    RARE WBC PRESENT, PREDOMINANTLY PMN NO ORGANISMS SEEN    Culture   Final    NO GROWTH < 24 HOURS Performed at [facility], [address], [place], [place] [address]    Report Status PENDING  Incomplete    Lab Basic Metabolic Panel: Recent Labs  Lab 05/12/24 0626 05/12/24 0906 05/12/24 1844 05/06/2024 0422  NA 131*  --   --  135  K 4.8  --   --  4.4  CL 92*  --   --  98  CO2 25  --   --  25  GLUCOSE 422*  --   --  209*  BUN 35*  --   --  38*  CREATININE 1.68*  --   --  1.38*  CALCIUM  8.3*  --   --  8.7*  MG  --  2.8* 2.7* 2.7*  PHOS  --  4.0 2.8 2.9   Liver Function Tests: Recent Labs  Lab 05/12/24 0626  AST 50*  ALT 55*  ALKPHOS 67  BILITOT 0.8  PROT 5.6*  ALBUMIN 2.4*   No results for input(s): LIPASE, AMYLASE in the last 168 hours. No results for input(s): AMMONIA in the last 168 hours. CBC: Recent Labs  Lab 05/12/24 0626 04/19/2024 0422  WBC 18.3* 17.0*  NEUTROABS 13.9*  --   HGB 15.7 15.3  HCT 52.1* 48.8  MCV 86.7 83.3  PLT 241 230   Cardiac Enzymes: No results for input(s): CKTOTAL, CKMB, CKMBINDEX, TROPONINI in the last 168 hours. Sepsis Labs: Recent Labs  Lab 05/12/24 0626 05/12/24 0628 04/24/2024 0422  WBC 18.3*  --  17.0*  LATICACIDVEN  --  4.8*  --

## 2024-05-18 NOTE — Death Summary Note (Signed)
 DEATH SUMMARY   Patient Details  Name: Jose Patrick MRN: [id number] DOB: 1951-07-24  Admission/Discharge Information   Admit Date:  May 31, 2024  Date of Death: Date of Death: June 01, 2024  Time of Death: Time of Death: 05-Jun-1710  Length of Stay: 1  Referring Physician: [facility], [facility]   Reason(s) for Hospitalization  Respiratory failure  Diagnoses  Preliminary cause of death: adenocarcinoma of the lung Secondary Diagnoses (including complications and co-morbidities):  Principal Problem:   Respiratory failure (HCC) Copd, emphysema HTN HLD   Brief Hospital Course (including significant findings, care, treatment, and services provided and events leading to death)  Jose Patrick was a 73 y.o. year old male with a past medical history significant for stage IV NSC lung cancer, COPD, HTN, HLD, type 2 diabetes, and BPH presented to the ED at [facility] for sudden onset shortness of breath.  On EMS arrival patient was satting 80% CPAP.  Acute respiratory distress on arrival patient was emergently intubated in the ED. During intubation patient briefly became pulseless and required ACLS protocol for approximately 2 mins. Labor significant for Na 131, Cl 92, glucose 422, creatinine 1.68, BUN 35, AST 50, ALT 55, HS troponin 57, lactic 4.8, WBC 18.3. CXR with extensive right greater than left airspace disease with increased density in the LUL. He was admitted to [facility] ICU and after discussion with family was made DNR. He had progressive hypoxemia despite maximum ventilator support and broad spectrum abx therapy. He also became hypotensive and required vasopressors. Ultimately given decline discussed with family who agreed to transition to comfort measures.     Pertinent Labs and Studies  Significant Diagnostic Studies ECHOCARDIOGRAM LIMITED Result Date: 2024-05-31    ECHOCARDIOGRAM LIMITED REPORT   Patient Name:   [name] Date of Exam: 05-31-2024 Medical Rec #:  [id number]           Height:       68.0 in Accession #:    [id number]         Weight:       236.8 lb Date of Birth:  1951-02-09          BSA:          2.196 m Patient Age:    73 years           BP:           116/59 mmHg Patient Gender: M                  HR:           88 bpm. Exam Location:  Inpatient Procedure: 2D Echo, Limited Echo, Color Doppler, Cardiac Doppler and            Intracardiac Opacification Agent (Both Spectral and Color Flow            Doppler were utilized during procedure). Indications:    Cardiac arrest  History:        Patient has prior history of Echocardiogram examinations, most                 recent 05/04/2024. Risk Factors:Hypertension, Diabetes and                 Dyslipidemia.  Sonographer:    Therisa Crouch Referring Phys: [phone] WHITNEY D HARRIS IMPRESSIONS  1. Technically difficult echo with limited visualization.  2. Left ventricular ejection fraction, by estimation, is 60 to 65%. The left ventricle has normal function. The left ventricle has no  regional wall motion abnormalities.  3. Right ventricular systolic function is normal. The right ventricular size is normal.  4. A small pericardial effusion is present.  5. The mitral valve is grossly normal. No evidence of mitral valve regurgitation. No evidence of mitral stenosis.  6. The aortic valve was not well visualized. Aortic valve regurgitation is not visualized. No aortic stenosis is present.  7. The inferior vena cava is normal in size with greater than 50% respiratory variability, suggesting right atrial pressure of 3 mmHg. FINDINGS  Left Ventricle: Left ventricular ejection fraction, by estimation, is 60 to 65%. The left ventricle has normal function. The left ventricle has no regional wall motion abnormalities. The left ventricular internal cavity size was normal in size. There is  no left ventricular hypertrophy. Right Ventricle: The right ventricular size is normal. No increase in right ventricular wall thickness. Right ventricular systolic  function is normal. Left Atrium: Left atrial size was normal in size. Right Atrium: Right atrial size was normal in size. Pericardium: A small pericardial effusion is present. Mitral Valve: The mitral valve is grossly normal. No evidence of mitral valve stenosis. Tricuspid Valve: The tricuspid valve is not well visualized. Tricuspid valve regurgitation is not demonstrated. No evidence of tricuspid stenosis. Aortic Valve: The aortic valve was not well visualized. Aortic valve regurgitation is not visualized. No aortic stenosis is present. Pulmonic Valve: The pulmonic valve was not well visualized. Pulmonic valve regurgitation is not visualized. No evidence of pulmonic stenosis. Aorta: The aortic root is normal in size and structure. Venous: The inferior vena cava is normal in size with greater than 50% respiratory variability, suggesting right atrial pressure of 3 mmHg. IAS/Shunts: No atrial level shunt detected by color flow Doppler. Ria Commander Electronically signed by Ria Commander Signature Date/Time: 05/12/2024/5:28:59 PM    Final    DG Abdomen 1 View Result Date: 05/12/2024 CLINICAL DATA:  Evaluate enteric tube placement. EXAM: ABDOMEN - 1 VIEW COMPARISON:  05/03/2024 FINDINGS: Enteric tube tip and side port are in the gastric fundus. Again seen are bilateral pulmonary opacities, right greater than left. Paucity of bowel gas noted within the upper abdomen. Postsurgical changes in the lumbar spine. IMPRESSION: Enteric tube tip and side port are in the gastric fundus. Electronically Signed   By: Waddell Calk M.D.   On: 05/12/2024 06:43   DG Chest Portable 1 View Result Date: 05/12/2024 CLINICAL DATA:  Bilateral airspace disease, now ventilator dependent, check for ETT positioning. EXAM: PORTABLE CHEST 1 VIEW COMPARISON:  Last portable chest was 05/03/2024 FINDINGS: 6:17 a.m. ETT interval insertion with tip 3.7 cm from the carina. There is an overlying electrical pad, overlying telemetry leads.  Extensive bilateral airspace disease continues to be seen, and again is most dense and most confluent on the right, with increasing density of patchy airspace disease today in the left upper lobe. There are at least small pleural effusions, greater on the right where there is obscuration of the hemidiaphragm. Heart is enlarged. Central vessels are difficult to see but may be slightly prominent on the left. The mediastinum is stable. There is aortic atherosclerosis. No new skeletal findings. IMPRESSION: 1. ETT tip 3.7 cm from the carina. 2. Extensive right greater than left airspace disease, with increasing density of patchy airspace disease today in the left upper lobe. The bilateral opacities are otherwise not notably changed. 3. Right greater than left pleural effusions. 4. Cardiomegaly. 5. Aortic atherosclerosis. Electronically Signed   By: Francis Quam M.D.   On: 05/12/2024  06:40   CT Angio Chest Pulmonary Embolism (PE) W or WO Contrast Result Date: 05/04/2024 CLINICAL DATA:  Stage IV non-small cell lung cancer, COPD, shortness of breath, chest pressure. * Tracking Code: BO * EXAM: CT ANGIOGRAPHY CHEST WITH CONTRAST TECHNIQUE: Multidetector CT imaging of the chest was performed using the standard protocol during bolus administration of intravenous contrast. Multiplanar CT image reconstructions and MIPs were obtained to evaluate the vascular anatomy. RADIATION DOSE REDUCTION: This exam was performed according to the departmental dose-optimization program which includes automated exposure control, adjustment of the mA and/or kV according to patient size and/or use of iterative reconstruction technique. CONTRAST:  75mL OMNIPAQUE  IOHEXOL  350 MG/ML SOLN COMPARISON:  04/01/2024 FINDINGS: Despite efforts by the technologist and patient, motion artifact is present on today's exam and could not be eliminated. This reduces exam sensitivity and specificity. Cardiovascular: No filling defect is identified in the  pulmonary arterial tree to suggest pulmonary embolus. Coronary, aortic arch, and branch vessel atherosclerotic vascular disease. Borderline cardiomegaly. Mediastinum/Nodes: AP window lymph node 1.0 cm in short axis on image 32 series 4, stable. Stable mild right paratracheal and stable right hilar adenopathy. Mildly dilated distal esophagus with air-fluid level. Lungs/Pleura: Emphysema. Continued consolidation of the entire right lower lobe with worsening consolidation in the right middle lobe and right upper lobe. Extensive small nodules are present diffusely throughout both lungs, substantially increased in size and number from 04/19/2024, possibilities include heavy burden of lung metastatic disease versus fungal or atypical infection. No cavitation identified. Upper Abdomen: Contracted gallbladder. Atheromatous plaque at the origin of the celiac trunk with some narrowing of the celiac trunk which may be due to median arcuate ligament. Mild right retrocrural adenopathy. Musculoskeletal: Thoracic spondylosis. Lower cervical plate and screw fixator. Review of the MIP images confirms the above findings. IMPRESSION: 1. No filling defect is identified in the pulmonary arterial tree to suggest pulmonary embolus. Adversely affected negative predictive value due to motion artifact. 2. Continued consolidation of the entire right lower lobe with worsening and substantial consolidation in the right middle lobe and right upper lobe. 3. Extensive small nodules are present diffusely throughout both lungs, substantially increased in size and number from 04/19/2024, possibilities include heavy burden of lung metastatic disease versus fungal or atypical infection. 4. Stable mild mediastinal and right hilar adenopathy. 5. Mildly dilated distal esophagus with air-fluid level, suggesting reflux. 6. Aortic Atherosclerosis (ICD10-I70.0) and Emphysema (ICD10-J43.9). Electronically Signed   By: Ryan Salvage M.D.   On: 05/04/2024  16:45   US  CHEST (PLEURAL EFFUSION) Result Date: 05/04/2024 CLINICAL DATA:  Right-sided pleural effusion EXAM: CHEST ULTRASOUND COMPARISON:  None Available. FINDINGS: Minimal fluid in present upon examination with ultrasound. No procedure performed. IMPRESSION: Minimal pleural fluid. Electronically Signed   By: Cordella Banner   On: 05/04/2024 14:12   ECHOCARDIOGRAM COMPLETE Result Date: 05/04/2024    ECHOCARDIOGRAM REPORT   Patient Name:   JOSUE FALCONI Date of Exam: 05/04/2024 Medical Rec #:  [id number]          Height:       68.0 in Accession #:    [id number]         Weight:       197.3 lb Date of Birth:  [birth year]          BSA:          2.032 m Patient Age:    73 years           BP:  134/85 mmHg Patient Gender: M                  HR:           81 bpm. Exam Location:  Inpatient Procedure: 2D Echo, Cardiac Doppler and Color Doppler (Both Spectral and Color            Flow Doppler were utilized during procedure). Indications:    Congestive Heart Failure I50.9  History:        Patient has prior history of Echocardiogram examinations, most                 recent 08/15/2023. Risk Factors:Hypertension and Diabetes.  Sonographer:    Jayson Gaskins Referring Phys: [id number] JONATHAN SEGARS IMPRESSIONS  1. Technically difficult study with poor visualization of endocardium.  2. Left ventricular ejection fraction, by estimation, is 55 to 60%. The left ventricle has normal function. The left ventricle has no regional wall motion abnormalities. Left ventricular diastolic parameters are indeterminate.  3. Right ventricular systolic function is normal. The right ventricular size is normal.  4. The mitral valve is normal in structure. No evidence of mitral valve regurgitation. No evidence of mitral stenosis.  5. The aortic valve has an indeterminant number of cusps. Aortic valve regurgitation is not visualized. No aortic stenosis is present.  6. The inferior vena cava is normal in size with greater than 50%  respiratory variability, suggesting right atrial pressure of 3 mmHg. FINDINGS  Left Ventricle: Left ventricular ejection fraction, by estimation, is 55 to 60%. The left ventricle has normal function. The left ventricle has no regional wall motion abnormalities. The left ventricular internal cavity size was normal in size. There is  no left ventricular hypertrophy. Left ventricular diastolic parameters are indeterminate. Right Ventricle: The right ventricular size is normal. No increase in right ventricular wall thickness. Right ventricular systolic function is normal. Left Atrium: Left atrial size was normal in size. Right Atrium: Right atrial size was normal in size. Pericardium: There is no evidence of pericardial effusion. Mitral Valve: The mitral valve is normal in structure. No evidence of mitral valve regurgitation. No evidence of mitral valve stenosis. Tricuspid Valve: The tricuspid valve is normal in structure. Tricuspid valve regurgitation is not demonstrated. No evidence of tricuspid stenosis. Aortic Valve: The aortic valve has an indeterminant number of cusps. Aortic valve regurgitation is not visualized. No aortic stenosis is present. Aortic valve mean gradient measures 3.0 mmHg. Aortic valve peak gradient measures 5.4 mmHg. Aortic valve area, by VTI measures 2.55 cm. Pulmonic Valve: The pulmonic valve was normal in structure. Pulmonic valve regurgitation is not visualized. No evidence of pulmonic stenosis. Aorta: The aortic root is normal in size and structure. Venous: The inferior vena cava is normal in size with greater than 50% respiratory variability, suggesting right atrial pressure of 3 mmHg. IAS/Shunts: No atrial level shunt detected by color flow Doppler.  LEFT VENTRICLE PLAX 2D LVIDd:         3.50 cm   Diastology LVIDs:         1.80 cm   LV e' medial:    6.85 cm/s LV PW:         0.90 cm   LV E/e' medial:  10.6 LV IVS:        0.60 cm   LV e' lateral:   9.46 cm/s LVOT diam:     1.90 cm   LV E/e'  lateral: 7.7 LV SV:  57 LV SV Index:   28 LVOT Area:     2.84 cm  RIGHT VENTRICLE RV S prime:     12.80 cm/s TAPSE (M-mode): 2.0 cm LEFT ATRIUM             Index        RIGHT ATRIUM           Index LA Vol (A2C):   32.2 ml 15.85 ml/m  RA Area:     10.60 cm LA Vol (A4C):   47.3 ml 23.28 ml/m  RA Volume:   20.10 ml  9.89 ml/m LA Biplane Vol: 40.6 ml 19.98 ml/m  AORTIC VALVE AV Area (Vmax):    2.49 cm AV Area (Vmean):   2.22 cm AV Area (VTI):     2.55 cm AV Vmax:           116.00 cm/s AV Vmean:          85.700 cm/s AV VTI:            0.222 m AV Peak Grad:      5.4 mmHg AV Mean Grad:      3.0 mmHg LVOT Vmax:         102.00 cm/s LVOT Vmean:        67.000 cm/s LVOT VTI:          0.200 m LVOT/AV VTI ratio: 0.90  AORTA Ao Root diam: 3.10 cm MITRAL VALVE MV Area (PHT): 2.08 cm    SHUNTS MV E velocity: 72.80 cm/s  Systemic VTI:  0.20 m MV A velocity: 94.30 cm/s  Systemic Diam: 1.90 cm MV E/A ratio:  0.77 Aditya Sabharwal Electronically signed by Ria Commander Signature Date/Time: 05/04/2024/2:02:29 PM    Final    DG Chest Portable 1 View Result Date: 05/03/2024 CLINICAL DATA:  Stage IV lung cancer, diabetes, productive cough after bronchoscopy yesterday EXAM: PORTABLE CHEST 1 VIEW COMPARISON:  04/25/2024 FINDINGS: Single frontal view of the chest demonstrates a stable cardiac silhouette. There is widespread bilateral airspace disease, right greater than left, increased since prior study. Underlying effusions are suspected, right greater than left. No pneumothorax. No acute bony abnormalities. IMPRESSION: 1. Progressive widespread bilateral airspace disease, right greater than left, which could reflect worsening infection, edema, or hemorrhage. 2. Bilateral pleural effusions, right greater than left. Electronically Signed   By: Ozell Daring M.D.   On: 05/03/2024 17:33   MR BRAIN WO CONTRAST Result Date: 04/28/2024 CLINICAL DATA:  Non-small cell lung carcinoma staging EXAM: MRI HEAD WITHOUT CONTRAST  TECHNIQUE: Multiplanar, multiecho pulse sequences of the brain and surrounding structures were obtained without intravenous contrast. COMPARISON:  06/30/2023 FINDINGS: Brain: No acute infarct, mass effect or extra-axial collection. No acute or chronic hemorrhage. There is multifocal hyperintense T2-weighted signal within the white matter. Generalized volume loss. The midline structures are normal. Lack of intravenous contrast agent administration limits sensitivity for the detection of metastases. Vascular: Normal flow voids. Skull and upper cervical spine: Normal calvarium and skull base. Visualized upper cervical spine and soft tissues are normal. Sinuses/Orbits:No paranasal sinus fluid levels or advanced mucosal thickening. No mastoid or middle ear effusion. Normal orbits. IMPRESSION: 1. Lack of intravenous contrast agent administration limits sensitivity for the detection of metastases. 2. No acute intracranial abnormality. 3. Findings of chronic small vessel ischemia and volume loss. Electronically Signed   By: Franky Stanford M.D.   On: 04/28/2024 18:19   DG CHEST PORT 1 VIEW Result Date: 04/25/2024 CLINICAL DATA:  Increased dyspnea EXAM: PORTABLE  CHEST 1 VIEW COMPARISON:  04/22/2024 FINDINGS: Stable cardiomediastinal silhouette. Diffuse bilateral reticulonodular opacities and interstitial coarsening prior. There is similar to increased airspace opacities in the right mid and lower lung. Layering right greater than left pleural effusions. No pneumothorax. No displaced rib fracture. IMPRESSION: 1. Similar to increased airspace and interstitial opacities greatest in the right lower lobe. 2. Layering right greater than left pleural effusions. Electronically Signed   By: Norman Gatlin M.D.   On: 04/25/2024 19:37   DG Chest Port 1 View Result Date: 04/22/2024 CLINICAL DATA:  Status post bronchoscopy with biopsy. EXAM: PORTABLE CHEST 1 VIEW COMPARISON:  April 20, 2024. FINDINGS: Stable cardiomediastinal  silhouette. Stable bilateral reticulonodular opacities are noted throughout both lungs concerning for pneumonia or edema with associated pleural effusions, right greater than left. No pneumothorax. Bony thorax is unremarkable. IMPRESSION: Bilateral lung opacities and pleural effusions as noted above. No pneumothorax is noted. Electronically Signed   By: Lynwood Landy Raddle M.D.   On: 04/22/2024 13:47   DG C-ARM BRONCHOSCOPY Result Date: 04/22/2024 C-ARM BRONCHOSCOPY: Fluoroscopy was utilized by the requesting physician.  No radiographic interpretation.   DG CHEST PORT 1 VIEW Result Date: 04/20/2024 CLINICAL DATA:  Status post thoracentesis. EXAM: PORTABLE CHEST 1 VIEW COMPARISON:  Radiograph in CT yesterday FINDINGS: No pneumothorax post thoracentesis. Slight decreased right basilar opacity, persistent airspace disease and likely pleural fluid persists. Underlying emphysema and bronchial thickening. Pulmonary nodules on CT are faintly visualized. Stable heart size and mediastinal contours. IMPRESSION: 1. No pneumothorax post thoracentesis. 2. Slight decreased right basilar opacity. Persistent right sided airspace disease and likely pleural fluid. Electronically Signed   By: Andrea Gasman M.D.   On: 04/20/2024 15:10   CT CHEST WO CONTRAST Result Date: 04/19/2024 CLINICAL DATA:  Multifocal pneumonia. Worsening cough and shortness of breath over the past few days. Decreased oxygen saturation. EXAM: CT CHEST WITHOUT CONTRAST TECHNIQUE: Multidetector CT imaging of the chest was performed following the standard protocol without IV contrast. RADIATION DOSE REDUCTION: This exam was performed according to the departmental dose-optimization program which includes automated exposure control, adjustment of the mA and/or kV according to patient size and/or use of iterative reconstruction technique. COMPARISON:  Chest radiograph 04/19/2024.  CT chest 04/01/2024 FINDINGS: Cardiovascular: Normal heart size. Minimal pericardial  effusion. Normal caliber thoracic aorta. Calcification of the aorta and coronary arteries. Mediastinum/Nodes: Esophagus is decompressed. Thyroid gland is unremarkable. Mediastinal lymphadenopathy with largest pretracheal nodes measuring up to about 1.4 cm diameter. No change since prior study. This is nonspecific but likely to be reactive. Lungs/Pleura: Bilateral pleural effusions, greater on the right. Dense consolidation in the right lower lung with patchy airspace disease throughout the remainder the right lung. Numerous small nodules demonstrated throughout the left lung in the area of portion of the right lung which are new since prior study. Short interval time frame suggest that these are likely infectious or inflammatory. Changes most likely represent progressing pneumonia. This could also represent atypical pneumonia such as TB or fungal infection. Septic emboli would also be a possibility. Follow-up to resolution is recommended. Underlying emphysematous changes in the lungs. Upper Abdomen: No acute abnormalities. Musculoskeletal: Degenerative changes in the spine. Postoperative change in the cervical spine. No acute bony abnormalities. IMPRESSION: 1. Bilateral pleural effusions, greater on the right. 2. Dense consolidation in the right lower lung with airspace disease throughout the remaining right lung. This is progressing since the prior study. 3. Developing diffuse fine nodular infiltration throughout the left lung in the aerated portion  of the right lung. This is likely infectious or inflammatory. 4. Underlying emphysematous changes in the lungs. Electronically Signed   By: Elsie Gravely M.D.   On: 04/19/2024 18:25   DG Chest 2 View Result Date: 04/19/2024 CLINICAL DATA:  Cough, shortness of breath EXAM: CHEST - 2 VIEW COMPARISON:  04/01/2024 FINDINGS: Diffuse bilateral airspace disease again noted, right greater than left, similar to prior study. Possible small bilateral effusions. Heart and  mediastinal contours within normal limits. Aortic atherosclerosis. IMPRESSION: Continued diffuse bilateral airspace disease, right greater than left concerning for pneumonia. Findings similar to prior study. Electronically Signed   By: Franky Crease M.D.   On: 04/19/2024 12:48    Microbiology Recent Results (from the past 240 hours)  Blood culture (routine x 2)     Status: None (Preliminary result)   Collection Time: 05/12/24  6:37 AM   Specimen: BLOOD  Result Value Ref Range Status   Specimen Description   Final    BLOOD BLOOD RIGHT HAND Performed at [facility], [address] [place], [place] [address]    Special Requests   Final    BOTTLES DRAWN AEROBIC AND ANAEROBIC Blood Culture results may not be optimal due to an inadequate volume of blood received in culture bottles Performed at [facility], [address] [place], [place] [address]    Culture   Final    NO GROWTH 1 DAY Performed at [facility], [address] [place], [place] [address]    Report Status PENDING  Incomplete  MRSA Next Gen by PCR, Nasal     Status: None   Collection Time: 05/12/24  7:46 AM   Specimen: Anterior Nasal Swab  Result Value Ref Range Status   MRSA by PCR Next Gen NOT DETECTED NOT DETECTED Final    Comment: (NOTE) The GeneXpert MRSA Assay (FDA approved for NASAL specimens only), is one component of a comprehensive MRSA colonization surveillance program. It is not intended to diagnose MRSA infection nor to guide or monitor treatment for MRSA infections. Test performance is not FDA approved in patients less than 56 years old. Performed at [facility], [address] [place], [place] [address]   Resp panel by RT-PCR (RSV, Flu A&B, Covid) Anterior Nasal Swab     Status: None   Collection Time: 05/12/24  8:19 AM   Specimen: Anterior Nasal Swab  Result Value Ref Range Status   SARS Coronavirus 2 by RT PCR NEGATIVE NEGATIVE Final     Comment: (NOTE) SARS-CoV-2 target nucleic acids are NOT DETECTED.  The SARS-CoV-2 RNA is generally detectable in upper respiratory specimens during the acute phase of infection. The lowest concentration of SARS-CoV-2 viral copies this assay can detect is 138 copies/mL. A negative result does not preclude SARS-Cov-2 infection and should not be used as the sole basis for treatment or other patient management decisions. A negative result may occur with  improper specimen collection/handling, submission of specimen other than nasopharyngeal swab, presence of viral mutation(s) within the areas targeted by this assay, and inadequate number of viral copies(<138 copies/mL). A negative result must be combined with clinical observations, patient history, and epidemiological information. The expected result is Negative.  Fact Sheet for Patients:  BloggerCourse.com  Fact Sheet for Healthcare Providers:  SeriousBroker.it  This test is no t yet approved or cleared by the [place]  FDA and  has been authorized for detection and/or diagnosis of SARS-CoV-2 by FDA under an Emergency Use  Authorization (EUA). This EUA will remain  in effect (meaning this test can be used) for the duration of the COVID-19 declaration under Section 564(b)(1) of the Act, 21 U.S.C.section 360bbb-3(b)(1), unless the authorization is terminated  or revoked sooner.       Influenza A by PCR NEGATIVE NEGATIVE Final   Influenza B by PCR NEGATIVE NEGATIVE Final    Comment: (NOTE) The Xpert Xpress SARS-CoV-2/FLU/RSV plus assay is intended as an aid in the diagnosis of influenza from Nasopharyngeal swab specimens and should not be used as a sole basis for treatment. Nasal washings and aspirates are unacceptable for Xpert Xpress SARS-CoV-2/FLU/RSV testing.  Fact Sheet for Patients: BloggerCourse.com  Fact Sheet for Healthcare  Providers: SeriousBroker.it  This test is not yet approved or cleared by the [place]  FDA and has been authorized for detection and/or diagnosis of SARS-CoV-2 by FDA under an Emergency Use Authorization (EUA). This EUA will remain in effect (meaning this test can be used) for the duration of the COVID-19 declaration under Section 564(b)(1) of the Act, 21 U.S.C. section 360bbb-3(b)(1), unless the authorization is terminated or revoked.     Resp Syncytial Virus by PCR NEGATIVE NEGATIVE Final    Comment: (NOTE) Fact Sheet for Patients: BloggerCourse.com  Fact Sheet for Healthcare Providers: SeriousBroker.it  This test is not yet approved or cleared by the [place]  FDA and has been authorized for detection and/or diagnosis of SARS-CoV-2 by FDA under an Emergency Use Authorization (EUA). This EUA will remain in effect (meaning this test can be used) for the duration of the COVID-19 declaration under Section 564(b)(1) of the Act, 21 U.S.C. section 360bbb-3(b)(1), unless the authorization is terminated or revoked.  Performed at [facility], [address] [place], [place] [address]   Blood culture (routine x 2)     Status: None (Preliminary result)   Collection Time: 05/12/24  9:06 AM   Specimen: BLOOD  Result Value Ref Range Status   Specimen Description   Final    BLOOD BLOOD RIGHT HAND AEROBIC BOTTLE ONLY ANAEROBIC BOTTLE ONLY Performed at [facility], [address], [place], [place] [address]    Special Requests   Final    NONE Performed at [facility], [address] [place], [place] [address]    Culture   Final    NO GROWTH < 24 HOURS Performed at [facility], [address], [place], [place] [address]    Report Status PENDING  Incomplete  Culture, Respiratory w Gram Stain     Status: None (Preliminary result)    Collection Time: 05/12/24 10:45 AM   Specimen: Bronchoalveolar Lavage; Respiratory  Result Value Ref Range Status   Specimen Description   Final    BRONCHIAL ALVEOLAR LAVAGE Performed at [facility], [address], [place], [place] [address]    Special Requests   Final    NONE Performed at [facility], [address] [place], [place] [address]    Gram Stain   Final    RARE WBC PRESENT, PREDOMINANTLY PMN NO ORGANISMS SEEN    Culture   Final    NO GROWTH < 24 HOURS Performed at [facility], [address] [place], [place] [address]    Report Status PENDING  Incomplete    Lab Basic Metabolic Panel: Recent Labs  Lab 05/12/24 0626 05/12/24 0906 05/12/24 1844 05/17/2024 0422  NA 131*  --   --  135  K  4.8  --   --  4.4  CL 92*  --   --  98  CO2 25  --   --  25  GLUCOSE 422*  --   --  209*  BUN 35*  --   --  38*  CREATININE 1.68*  --   --  1.38*  CALCIUM  8.3*  --   --  8.7*  MG  --  2.8* 2.7* 2.7*  PHOS  --  4.0 2.8 2.9   Liver Function Tests: Recent Labs  Lab 05/12/24 0626  AST 50*  ALT 55*  ALKPHOS 67  BILITOT 0.8  PROT 5.6*  ALBUMIN 2.4*   No results for input(s): LIPASE, AMYLASE in the last 168 hours. No results for input(s): AMMONIA in the last 168 hours. CBC: Recent Labs  Lab 05/12/24 0626 05/03/2024 0422  WBC 18.3* 17.0*  NEUTROABS 13.9*  --   HGB 15.7 15.3  HCT 52.1* 48.8  MCV 86.7 83.3  PLT 241 230   Cardiac Enzymes: No results for input(s): CKTOTAL, CKMB, CKMBINDEX, TROPONINI in the last 168 hours. Sepsis Labs: Recent Labs  Lab 05/12/24 0626 05/12/24 0628 04/26/2024 0422  WBC 18.3*  --  17.0*  LATICACIDVEN  --  4.8*  --

## 2024-05-18 NOTE — Progress Notes (Signed)
 Patient extubated to RA for comfort care at this time. Family and RN at bedside.

## 2024-05-18 NOTE — Progress Notes (Signed)
 NAME:  Jose Patrick, MRN:  [id number], DOB:  12-Dec-1950, LOS: 1 ADMISSION DATE:  05/12/2024, CONSULTATION DATE:  05/12/2024 REFERRING MD:  Dr. Bari - EDP, CHIEF COMPLAINT:  Respiratory distress    History of Present Illness:  Jose Patrick is a 73 year old male with a past medical history significant for stage IV NSC lung cancer, COPD, HTN, HLD, type 2 diabetes, and BPH presented to the ED at [facility] for sudden onset shortness of breath.  On EMS arrival patient was satting 80% CPAP.  Acute respiratory distress on arrival patient was emergently intubated in the ED. During intubation patient briefly became pulseless and required ACLS protocol for approximately 2 mins. Labor significant for Na 131, Cl 92, glucose 422, creatinine 1.68, BUN 35, AST 50, ALT 55, HS troponin 57, lactic 4.8, WBC 18.3. CXR with extensive right greater than left airspace disease with increased density in the LUL.   Pertinent  Medical History  Stage IV NSC lung cancer, COPD, HTN, HLD, type 2 diabetes, and BPH   Significant Hospital Events: Including procedures, antibiotic start and stop dates in addition to other pertinent events   05/12/2024 Admitted with sudden onset SOB, intubated emergently in ED, became pulseless prior to intubation 6/26 Remains intubated with FIO2 100 and PEEP 14   Interim History / Subjective:  Sedated on vent with high vent settings  Objective    Blood pressure (!) 115/58, pulse 90, temperature 99.5 F (37.5 C), resp. rate (!) 29, height 5' 8 (1.727 m), weight 108 kg, SpO2 (!) 88%.    Vent Mode: PRVC FiO2 (%):  [100 %] 100 % Set Rate:  [28 bmp] 28 bmp Vt Set:  [540 mL] 540 mL PEEP:  [10 cmH20-14 cmH20] 14 cmH20 Plateau Pressure:  [39 cmH20-44 cmH20] 43 cmH20   Intake/Output Summary (Last 24 hours) at 05/11/2024 0854 Last data filed at 05/04/2024 0800 Gross per 24 hour  Intake 2463.97 ml  Output 1900 ml  Net 563.97 ml   Filed Weights   05/12/24 0845 05/04/2024 0500   Weight: 107.4 kg 108 kg    Examination: General: Acute on chronically ill appearing elderly male lying in bed on mechanical ventilation, in NAD HEENT: [place]/AT, MM pink/moist, PERRL,  Neuro: Sedated on vent  CV: s1s2 regular rate and rhythm, no murmur, rubs, or gallops,  PULM:  Diminished bilaterally with rhonchi and expiratory wheeze bilaterally, high vent setting  GI: soft, bowel sounds active in all 4 quadrants, non-tender, non-distended Extremities: warm/dry, no edema  Skin: no rashes or lesions  Resolved problem list   Assessment and Plan   Acute hypoxic  and hypercapnic respiratory failure  -Patient utilizes 5L [place] at baseline  COPD P: Continue ventilator support with lung protective strategies  Wean PEEP and FiO2 for sats greater than 90%. Head of bed elevated 30 degrees. Plateau pressures less than 30 cm H20.  Follow intermittent chest x-ray and ABG.   SAT/SBT as tolerated, mentation preclude extubation  Ensure adequate pulmonary hygiene  Follow cultures  VAP bundle in place  PAD protocol Follow BAL, no organisms seen thus far  Continue Cefepime , stop Vancomycin with negative PCR  Stage IV NSC lung cancer -Appears patient had a suspicious pulmonary nodule March 2024 for which a bronch was preformed and was negative at [facility] but given high suspicion empiric radiation therapy was started fall 2024 given hypermetabolic PET  -Post radiation treatment CT December 2024 revealed enlargement of nodule worrisome for progression.  -January PET was not active indicating  radiation therapy was successful  -June 5th 2025 underwent Bronch with Dr. Shelah which was positive for non small cell lung cancer in all 6 sites biopsied  P: Supportive care  Vent support as above   In-hospital cardiac arrest - Per ED RN report appears patient suffered a brief 2-minute cardiac arrest just prior to intubation, likely respiratory mediated -Echocardiogram 05/04/24 with a EF of 55 to 60%, no WMA,  normal RV function P: Continuous telemetry Repeat ECHO stable Strict intake and output  Optimize electrolytes  Essential hypertension  Hyperlipidemia   -Home medications include Lipitor, Lasix , Cozaar , Toprol -XL,  P: Home medications given hypotension Continuous telemetry  Type 2 diabetes with hyperglycemia  -Home medications include Semaglutide   P: SSI  CBG checks q4 CBG goal 140-180  BPH  P: Resume home medications when appropriate   Mildly elevated LFTs  P: Monitor    At risk malnutrition  P:  Optimize nutrition  Supplement protein   Best Practice (right click and Reselect all SmartList Selections daily)   Diet/type: NPO DVT prophylaxis prophylactic heparin  Pressure ulcer(s): N/A GI prophylaxis: PPI Lines: N/A Foley:  Yes, and it is still needed Code Status:  full code  Last date of multidisciplinary goals of care discussion: Ongoing, did discuss with wife 6/25 and decision was made to change code status back to DNR with statement of we do not want him to suffer   Critical care time:   CRITICAL CARE Performed by: Lindon Kiel D. Harris   Total critical care time: 38 minutes  Critical care time was exclusive of separately billable procedures and treating other patients.  Critical care was necessary to treat or prevent imminent or life-threatening deterioration.  Critical care was time spent personally by me on the following activities: development of treatment plan with patient and/or surrogate as well as nursing, discussions with consultants, evaluation of patient's response to treatment, examination of patient, obtaining history from patient or surrogate, ordering and performing treatments and interventions, ordering and review of laboratory studies, ordering and review of radiographic studies, pulse oximetry and re-evaluation of patient's condition.  Savina Olshefski D. Harris, NP-C [facility] Personal contact information can be found on  Amion  If no contact or response made please call [phone] 04/24/2024, 8:54 AM

## 2024-05-18 NOTE — Accreditation Note (Signed)
 Death within 24 hours of discontinuation of bilateral soft wrist restraints logged on 05/20/2024 at 0915 by Valentin Lai RN

## 2024-05-18 NOTE — Procedures (Signed)
 Arterial Catheter Insertion Procedure Note  Jose Patrick  [id number]  [birth year]  Date:04/19/2024  Upfz:[id number]    Provider Performing: Estell Sailors    Procedure: Insertion of Arterial Line (63379) without US  guidance  Indication(s) Blood pressure monitoring and/or need for frequent ABGs  Consent Risks of the procedure as well as the alternatives and risks of each were explained to the patient and/or caregiver.  Consent for the procedure was obtained and is signed in the bedside chart  Anesthesia None   Time Out Verified patient identification, verified procedure, site/side was marked, verified correct patient position, special equipment/implants available, medications/allergies/relevant history reviewed, required imaging and test results available.   Sterile Technique Maximal sterile technique including full sterile barrier drape, hand hygiene, sterile gown, sterile gloves, mask, hair covering, sterile ultrasound probe cover (if used).   Procedure Description Area of catheter insertion was cleaned with chlorhexidine  and draped in sterile fashion. Without real-time ultrasound guidance an arterial catheter was placed into the left radial artery.  Appropriate arterial tracings confirmed on monitor.     Complications/Tolerance None; patient tolerated the procedure well.    Specimen(s) None

## 2024-05-18 NOTE — Progress Notes (Signed)
 Provided emotional support to Eldrige's family throughout the afternoon by way of prayer, listening, and facilitating conversation among family members, listening to them share memories and photos. He has a large family and is very beloved.  Their pastors are now present with family.  If other needs arise, please page us  at [phone].

## 2024-05-18 DEATH — deceased

## 2024-05-27 LAB — FUNGUS CULTURE WITH STAIN

## 2024-05-27 LAB — FUNGAL ORGANISM REFLEX

## 2024-05-27 LAB — FUNGUS CULTURE RESULT

## 2024-06-02 LAB — ACID FAST CULTURE WITH REFLEXED SENSITIVITIES (MYCOBACTERIA): Acid Fast Culture: NEGATIVE

## 2024-06-12 LAB — FUNGUS CULTURE WITH STAIN

## 2024-06-12 LAB — FUNGUS CULTURE RESULT

## 2024-06-12 LAB — FUNGAL ORGANISM REFLEX

## 2024-06-25 LAB — ACID FAST CULTURE WITH REFLEXED SENSITIVITIES (MYCOBACTERIA): Acid Fast Culture: NEGATIVE

## 2024-07-12 ENCOUNTER — Other Ambulatory Visit (HOSPITAL_COMMUNITY): Payer: Self-pay
